# Patient Record
Sex: Female | Born: 1940 | Race: White | Hispanic: No | Smoking: Former smoker
Health system: Southern US, Community
[De-identification: ages and names within clinical notes are randomized; demographics above are authoritative.]

## PROBLEM LIST (undated history)

## (undated) DIAGNOSIS — I471 Supraventricular tachycardia, unspecified: Secondary | ICD-10-CM

## (undated) DIAGNOSIS — K269 Duodenal ulcer, unspecified as acute or chronic, without hemorrhage or perforation: Secondary | ICD-10-CM

## (undated) DIAGNOSIS — R918 Other nonspecific abnormal finding of lung field: Secondary | ICD-10-CM

## (undated) DIAGNOSIS — K5790 Diverticulosis of intestine, part unspecified, without perforation or abscess without bleeding: Secondary | ICD-10-CM

## (undated) DIAGNOSIS — E785 Hyperlipidemia, unspecified: Secondary | ICD-10-CM

## (undated) DIAGNOSIS — I5189 Other ill-defined heart diseases: Secondary | ICD-10-CM

## (undated) DIAGNOSIS — R0789 Other chest pain: Secondary | ICD-10-CM

## (undated) DIAGNOSIS — F419 Anxiety disorder, unspecified: Secondary | ICD-10-CM

## (undated) DIAGNOSIS — M8580 Other specified disorders of bone density and structure, unspecified site: Secondary | ICD-10-CM

## (undated) DIAGNOSIS — K2950 Unspecified chronic gastritis without bleeding: Secondary | ICD-10-CM

## (undated) DIAGNOSIS — A310 Pulmonary mycobacterial infection: Secondary | ICD-10-CM

## (undated) DIAGNOSIS — K2970 Gastritis, unspecified, without bleeding: Secondary | ICD-10-CM

## (undated) DIAGNOSIS — H2510 Age-related nuclear cataract, unspecified eye: Secondary | ICD-10-CM

## (undated) DIAGNOSIS — M1990 Unspecified osteoarthritis, unspecified site: Secondary | ICD-10-CM

## (undated) DIAGNOSIS — L719 Rosacea, unspecified: Secondary | ICD-10-CM

## (undated) DIAGNOSIS — Z9289 Personal history of other medical treatment: Secondary | ICD-10-CM

## (undated) DIAGNOSIS — J449 Chronic obstructive pulmonary disease, unspecified: Secondary | ICD-10-CM

## (undated) DIAGNOSIS — I2510 Atherosclerotic heart disease of native coronary artery without angina pectoris: Secondary | ICD-10-CM

## (undated) DIAGNOSIS — K449 Diaphragmatic hernia without obstruction or gangrene: Secondary | ICD-10-CM

## (undated) DIAGNOSIS — K225 Diverticulum of esophagus, acquired: Secondary | ICD-10-CM

## (undated) DIAGNOSIS — K2270 Barrett's esophagus without dysplasia: Secondary | ICD-10-CM

## (undated) DIAGNOSIS — J45909 Unspecified asthma, uncomplicated: Secondary | ICD-10-CM

## (undated) DIAGNOSIS — K219 Gastro-esophageal reflux disease without esophagitis: Secondary | ICD-10-CM

## (undated) HISTORY — DX: Supraventricular tachycardia, unspecified: I47.10

## (undated) HISTORY — DX: Barrett's esophagus without dysplasia: K22.70

## (undated) HISTORY — DX: Other chest pain: R07.89

## (undated) HISTORY — DX: Supraventricular tachycardia: I47.1

## (undated) HISTORY — DX: Personal history of other medical treatment: Z92.89

## (undated) HISTORY — DX: Other nonspecific abnormal finding of lung field: R91.8

## (undated) HISTORY — DX: Atherosclerotic heart disease of native coronary artery without angina pectoris: I25.10

## (undated) HISTORY — DX: Diverticulosis of intestine, part unspecified, without perforation or abscess without bleeding: K57.90

## (undated) HISTORY — DX: Other ill-defined heart diseases: I51.89

## (undated) HISTORY — DX: Diaphragmatic hernia without obstruction or gangrene: K44.9

## (undated) HISTORY — DX: Gastritis, unspecified, without bleeding: K29.70

## (undated) HISTORY — PX: EYE SURGERY: SHX253

## (undated) HISTORY — PX: TONSILLECTOMY: SUR1361

## (undated) HISTORY — DX: Unspecified osteoarthritis, unspecified site: M19.90

## (undated) HISTORY — DX: Chronic obstructive pulmonary disease, unspecified: J44.9

## (undated) HISTORY — DX: Pulmonary mycobacterial infection: A31.0

## (undated) HISTORY — PX: BREAST SURGERY: SHX581

## (undated) HISTORY — PX: CATARACT EXTRACTION W/ INTRAOCULAR LENS  IMPLANT, BILATERAL: SHX1307

---

## 2002-04-08 HISTORY — PX: BREAST EXCISIONAL BIOPSY: SUR124

## 2006-04-08 HISTORY — PX: BREAST BIOPSY: SHX20

## 2012-11-02 DIAGNOSIS — K317 Polyp of stomach and duodenum: Secondary | ICD-10-CM | POA: Insufficient documentation

## 2012-11-02 DIAGNOSIS — D131 Benign neoplasm of stomach: Secondary | ICD-10-CM | POA: Insufficient documentation

## 2012-11-19 DIAGNOSIS — R232 Flushing: Secondary | ICD-10-CM | POA: Insufficient documentation

## 2012-11-19 DIAGNOSIS — K219 Gastro-esophageal reflux disease without esophagitis: Secondary | ICD-10-CM | POA: Insufficient documentation

## 2012-11-19 DIAGNOSIS — K279 Peptic ulcer, site unspecified, unspecified as acute or chronic, without hemorrhage or perforation: Secondary | ICD-10-CM | POA: Insufficient documentation

## 2012-11-19 DIAGNOSIS — L719 Rosacea, unspecified: Secondary | ICD-10-CM | POA: Insufficient documentation

## 2012-11-19 HISTORY — DX: Peptic ulcer, site unspecified, unspecified as acute or chronic, without hemorrhage or perforation: K27.9

## 2012-12-14 DIAGNOSIS — H2510 Age-related nuclear cataract, unspecified eye: Secondary | ICD-10-CM | POA: Insufficient documentation

## 2012-12-25 DIAGNOSIS — R9389 Abnormal findings on diagnostic imaging of other specified body structures: Secondary | ICD-10-CM | POA: Insufficient documentation

## 2012-12-25 DIAGNOSIS — R799 Abnormal finding of blood chemistry, unspecified: Secondary | ICD-10-CM | POA: Insufficient documentation

## 2014-02-22 DIAGNOSIS — E782 Mixed hyperlipidemia: Secondary | ICD-10-CM | POA: Insufficient documentation

## 2014-03-02 ENCOUNTER — Ambulatory Visit: Payer: Self-pay | Admitting: Internal Medicine

## 2014-03-23 DIAGNOSIS — J4520 Mild intermittent asthma, uncomplicated: Secondary | ICD-10-CM | POA: Insufficient documentation

## 2014-07-05 ENCOUNTER — Ambulatory Visit: Payer: Self-pay | Admitting: Gastroenterology

## 2014-09-21 DIAGNOSIS — F419 Anxiety disorder, unspecified: Secondary | ICD-10-CM | POA: Insufficient documentation

## 2014-09-21 DIAGNOSIS — M8580 Other specified disorders of bone density and structure, unspecified site: Secondary | ICD-10-CM | POA: Insufficient documentation

## 2015-01-19 ENCOUNTER — Encounter: Payer: Self-pay | Admitting: *Deleted

## 2015-01-20 ENCOUNTER — Ambulatory Visit: Payer: Medicare Other | Admitting: Anesthesiology

## 2015-01-20 ENCOUNTER — Encounter
Admission: RE | Disposition: A | Discharge status: Home / Self Care | Payer: Self-pay | Source: Ambulatory Visit | Attending: Gastroenterology

## 2015-01-20 ENCOUNTER — Ambulatory Visit
Admission: RE | Admit: 2015-01-20 | Discharge: 2015-01-20 | Disposition: A | Discharge status: Home / Self Care | Payer: Medicare Other | Source: Ambulatory Visit | Attending: Gastroenterology | Admitting: Gastroenterology

## 2015-01-20 DIAGNOSIS — Z79899 Other long term (current) drug therapy: Secondary | ICD-10-CM | POA: Diagnosis not present

## 2015-01-20 DIAGNOSIS — Z888 Allergy status to other drugs, medicaments and biological substances status: Secondary | ICD-10-CM | POA: Insufficient documentation

## 2015-01-20 DIAGNOSIS — K2950 Unspecified chronic gastritis without bleeding: Secondary | ICD-10-CM | POA: Insufficient documentation

## 2015-01-20 DIAGNOSIS — K279 Peptic ulcer, site unspecified, unspecified as acute or chronic, without hemorrhage or perforation: Secondary | ICD-10-CM | POA: Insufficient documentation

## 2015-01-20 DIAGNOSIS — K225 Diverticulum of esophagus, acquired: Secondary | ICD-10-CM

## 2015-01-20 DIAGNOSIS — K219 Gastro-esophageal reflux disease without esophagitis: Secondary | ICD-10-CM | POA: Insufficient documentation

## 2015-01-20 DIAGNOSIS — J45909 Unspecified asthma, uncomplicated: Secondary | ICD-10-CM | POA: Diagnosis not present

## 2015-01-20 DIAGNOSIS — K317 Polyp of stomach and duodenum: Secondary | ICD-10-CM | POA: Insufficient documentation

## 2015-01-20 DIAGNOSIS — Z8719 Personal history of other diseases of the digestive system: Secondary | ICD-10-CM | POA: Diagnosis not present

## 2015-01-20 DIAGNOSIS — R1013 Epigastric pain: Secondary | ICD-10-CM | POA: Diagnosis present

## 2015-01-20 HISTORY — DX: Rosacea, unspecified: L71.9

## 2015-01-20 HISTORY — DX: Unspecified asthma, uncomplicated: J45.909

## 2015-01-20 HISTORY — DX: Age-related nuclear cataract, unspecified eye: H25.10

## 2015-01-20 HISTORY — PX: ESOPHAGOGASTRODUODENOSCOPY (EGD) WITH PROPOFOL: SHX5813

## 2015-01-20 HISTORY — DX: Diverticulum of esophagus, acquired: K22.5

## 2015-01-20 HISTORY — DX: Duodenal ulcer, unspecified as acute or chronic, without hemorrhage or perforation: K26.9

## 2015-01-20 HISTORY — DX: Gastro-esophageal reflux disease without esophagitis: K21.9

## 2015-01-20 SURGERY — ESOPHAGOGASTRODUODENOSCOPY (EGD) WITH PROPOFOL
Anesthesia: General

## 2015-01-20 MED ORDER — PROPOFOL 500 MG/50ML IV EMUL
INTRAVENOUS | Status: DC | PRN
  Administered 2015-01-20: 120 ug/kg/min via INTRAVENOUS

## 2015-01-20 MED ORDER — PROPOFOL 10 MG/ML IV BOLUS
INTRAVENOUS | Status: DC | PRN
  Administered 2015-01-20: 70 mg via INTRAVENOUS

## 2015-01-20 MED ORDER — FENTANYL CITRATE (PF) 100 MCG/2ML IJ SOLN
INTRAMUSCULAR | Status: DC | PRN
  Administered 2015-01-20: 50 ug via INTRAVENOUS

## 2015-01-20 MED ORDER — SODIUM CHLORIDE 0.9 % IV SOLN
INTRAVENOUS | Status: DC
  Administered 2015-01-20: 09:00:00 via INTRAVENOUS

## 2015-01-20 MED ORDER — GLYCOPYRROLATE 0.2 MG/ML IJ SOLN
INTRAMUSCULAR | Status: DC | PRN
  Administered 2015-01-20: 0.2 mg via INTRAVENOUS

## 2015-01-20 MED ORDER — LIDOCAINE HCL (CARDIAC) 20 MG/ML IV SOLN
INTRAVENOUS | Status: DC | PRN
  Administered 2015-01-20: 50 mg via INTRAVENOUS

## 2015-01-20 MED ORDER — MIDAZOLAM HCL 5 MG/5ML IJ SOLN
INTRAMUSCULAR | Status: DC | PRN
  Administered 2015-01-20: 1 mg via INTRAVENOUS

## 2015-01-20 MED ORDER — SODIUM CHLORIDE 0.9 % IV SOLN
INTRAVENOUS | Status: DC
  Administered 2015-01-20: 1000 mL via INTRAVENOUS

## 2015-01-20 MED ORDER — EPHEDRINE SULFATE 50 MG/ML IJ SOLN
INTRAMUSCULAR | Status: DC | PRN
  Administered 2015-01-20: 10 mg via INTRAVENOUS

## 2015-01-20 NOTE — Anesthesia Preprocedure Evaluation (Signed)
Anesthesia Evaluation  Patient identified by MRN, date of birth, ID band Patient awake    Reviewed: Allergy & Precautions, NPO status , Patient's Chart, lab work & pertinent test results  History of Anesthesia Complications Negative for: history of anesthetic complications  Airway Mallampati: I       Dental  (+) Teeth Intact   Pulmonary neg pulmonary ROS, asthma ,           Cardiovascular negative cardio ROS       Neuro/Psych negative neurological ROS     GI/Hepatic Neg liver ROS, PUD, GERD  Medicated,  Endo/Other  negative endocrine ROS  Renal/GU negative Renal ROS     Musculoskeletal   Abdominal   Peds  Hematology   Anesthesia Other Findings   Reproductive/Obstetrics                             Anesthesia Physical Anesthesia Plan  ASA: II  Anesthesia Plan: General   Post-op Pain Management:    Induction: Intravenous  Airway Management Planned: Nasal Cannula  Additional Equipment:   Intra-op Plan:   Post-operative Plan:   Informed Consent: I have reviewed the patients History and Physical, chart, labs and discussed the procedure including the risks, benefits and alternatives for the proposed anesthesia with the patient or authorized representative who has indicated his/her understanding and acceptance.     Plan Discussed with:   Anesthesia Plan Comments:         Anesthesia Quick Evaluation

## 2015-01-20 NOTE — H&P (Signed)
Outpatient short stay form Pre-procedure 01/20/2015 8:54 AM Alison Sails MD  Primary Physician: Dr. Glendon Axe  Reason for visit:  EGD  History of present illness:  Alison Bradshaw is a 74 year old female presenting today for EGD. She has a history of duodenal adenoma area there is also a history of a typically elevated chromogranin A without evidence of attributing lesion.    Current facility-administered medications:  .  0.9 %  sodium chloride infusion, , Intravenous, Continuous, Alison Sails, MD .  0.9 %  sodium chloride infusion, , Intravenous, Continuous, Alison Sails, MD  Prescriptions prior to admission  Medication Sig Dispense Refill Last Dose  . ALBUTEROL IN Inhale 90 mcg into the lungs every 6 (six) hours as needed.     . ALPRAZolam (XANAX) 0.25 MG tablet Take 0.25 mg by mouth 2 (two) times daily as needed for anxiety.     Marland Kitchen atorvastatin (LIPITOR) 10 MG tablet Take 10 mg by mouth daily.     . fluticasone (FLONASE) 50 MCG/ACT nasal spray Place 2 sprays into both nostrils daily.     . sucralfate (CARAFATE) 1 G tablet Take 1 g by mouth 4 (four) times daily -  with meals and at bedtime.        Allergies  Allergen Reactions  . Ceftin [Cefuroxime Axetil]   . Timentin [Ticarcillin-Pot Clavulanate]      Past Medical History  Diagnosis Date  . Duodenal ulcer   . Rosacea   . GERD (gastroesophageal reflux disease)   . Asthma   . Senile nuclear sclerosis     Review of systems:      Physical Exam    Heart and lungs: Regular rate and rhythm without rub or gallop, lungs are bilaterally clear    HEENT: Normocephalic atraumatic eyes are anicteric    Other:     Pertinant exam for procedure: Soft mild tenderness to palpation the epigastric region no masses rebound or organomegaly bowel sounds are positive normoactive.    Planned proceedures: EGD and indicated procedures I have discussed the risks benefits and complications of procedures to include not limited  to bleeding, infection, perforation and the risk of sedation and the Alison Bradshaw wishes to proceed.    Alison Sails, MD Gastroenterology 01/20/2015  8:54 AM

## 2015-01-20 NOTE — Op Note (Signed)
[facility] Gastroenterology Patient Name: Alison Bradshaw Procedure Date: 01/20/2015 9:04 AM MRN: [id number] Account #: [id number] Date of Birth: July 24, 1940 Admit Type: Outpatient Age: 74 Room: [facility] ENDO ROOM 3 Gender: Female Note Status: Finalized Procedure:         Upper GI endoscopy Indications:       Epigastric abdominal pain Providers:         Lollie Sails, MD Referring MD:      Glendon Axe (Referring MD) Medicines:         Monitored Anesthesia Care Complications:     No immediate complications. Procedure:         Pre-Anesthesia Assessment:                    - ASA Grade Assessment: II - A patient with mild systemic                     disease.                    After obtaining informed consent, the endoscope was passed                     under direct vision. Throughout the procedure, the                     patient's blood pressure, pulse, and oxygen saturations                     were monitored continuously. The Endoscope was introduced                     through the mouth, and advanced to the third part of                     duodenum. The upper GI endoscopy was accomplished without                     difficulty. The patient tolerated the procedure well. Findings:      LA Grade A (one or more mucosal breaks less than 5 mm, not extending       between tops of 2 mucosal folds) esophagitis with no bleeding was found.       Biopsies were taken with a cold forceps for histology.      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      The exam of the esophagus was otherwise normal.      Multiple 1 mm sessile polyps with no bleeding and no stigmata of recent       bleeding were found in the gastric body. Biopsies were taken with a cold       forceps for histology.      Diffuse minimal inflammation characterized by congestion (edema) and       erythema was found in  the gastric body. Biopsies were taken with a cold       forceps for histology.      Localized mild mucosal variance characterized by texture change was       found in the duodenal bulb. Biopsies were taken with a cold forceps for       histology.      the c-loop is very angulated in the second portion and the scope  was       very carefully passed to show a normal appearance to the 3rd portion. Impression:        - LA Grade A erosive esophagitis. Biopsied.                    - Zenker's diverticulum.                    - Multiple gastric polyps. Biopsied.                    - Bile gastritis. Biopsied.                    - Mucosal variant in the duodenum. Biopsied. Recommendation:    - Perform an upper GI series and small bowel follow                     through at appointment to be scheduled.                    - Continue present medications. Procedure Code(s): --- Professional ---                    [phone], Esophagogastroduodenoscopy, flexible, transoral;                     with biopsy, single or multiple Diagnosis Code(s): --- Professional ---                    530.19, Other esophagitis                    530.6, Diverticulum of esophagus, acquired                    211.1, Benign neoplasm of stomach                    535.40, Other specified gastritis, without mention of                     hemorrhage                    537.9, Unspecified disorder of stomach and duodenum                    789.06, Abdominal pain, epigastric CPT copyright 2014 American Medical Association. All rights reserved. The codes documented in this report are preliminary and upon coder review may  be revised to meet current compliance requirements. Lollie Sails, MD 01/20/2015 9:38:50 AM This report has been signed electronically. Number of Addenda: 0 Note Initiated On: 01/20/2015 9:04 AM      [facility]

## 2015-01-20 NOTE — Transfer of Care (Signed)
Immediate Anesthesia Transfer of Care Note  Patient: Alison Bradshaw  Procedure(s) Performed: Procedure(s): ESOPHAGOGASTRODUODENOSCOPY (EGD) WITH PROPOFOL (N/A)  Patient Location: Endoscopy Unit  Anesthesia Type:General  Level of Consciousness: awake  Airway & Oxygen Therapy: Patient Spontanous Breathing and Patient connected to nasal cannula oxygen  Post-op Assessment: Report given to RN  Post vital signs: Reviewed  Last Vitals:  Filed Vitals:   01/20/15 0934  BP: 91/41  Pulse: 74  Temp: 35.8 C  Resp: 18    Complications: No apparent anesthesia complications

## 2015-01-20 NOTE — Anesthesia Postprocedure Evaluation (Signed)
  Anesthesia Post-op Note  Patient: Alison Bradshaw  Procedure(s) Performed: Procedure(s): ESOPHAGOGASTRODUODENOSCOPY (EGD) WITH PROPOFOL (N/A)  Anesthesia type:General  Patient location: PACU  Post pain: Pain level controlled  Post assessment: Post-op Vital signs reviewed, Patient's Cardiovascular Status Stable, Respiratory Function Stable, Patent Airway and No signs of Nausea or vomiting  Post vital signs: Reviewed and stable  Last Vitals:  Filed Vitals:   01/20/15 1000  BP: 106/70  Pulse: 70  Temp:   Resp: 16    Level of consciousness: awake, alert  and patient cooperative  Complications: No apparent anesthesia complications

## 2015-01-23 LAB — SURGICAL PATHOLOGY

## 2015-01-25 ENCOUNTER — Encounter: Payer: Self-pay | Admitting: Gastroenterology

## 2015-04-10 ENCOUNTER — Emergency Department
Admission: EM | Admit: 2015-04-10 | Discharge: 2015-04-10 | Disposition: A | Discharge status: Home / Self Care | Payer: Medicare Other | Attending: Emergency Medicine | Admitting: Emergency Medicine

## 2015-04-10 DIAGNOSIS — H60502 Unspecified acute noninfective otitis externa, left ear: Secondary | ICD-10-CM | POA: Diagnosis not present

## 2015-04-10 DIAGNOSIS — Z7951 Long term (current) use of inhaled steroids: Secondary | ICD-10-CM | POA: Diagnosis not present

## 2015-04-10 DIAGNOSIS — Z79899 Other long term (current) drug therapy: Secondary | ICD-10-CM | POA: Diagnosis not present

## 2015-04-10 DIAGNOSIS — H9202 Otalgia, left ear: Secondary | ICD-10-CM | POA: Diagnosis present

## 2015-04-10 DIAGNOSIS — H6092 Unspecified otitis externa, left ear: Secondary | ICD-10-CM

## 2015-04-10 MED ORDER — ONDANSETRON 4 MG PO TBDP: 4.0000 mg | ORAL_TABLET | Freq: Three times a day (TID) | ORAL | Status: DC | PRN

## 2015-04-10 MED ORDER — OFLOXACIN 0.3 % OT SOLN: 5.0000 [drp] | Freq: Every day | OTIC | Status: DC

## 2015-04-10 MED ORDER — ONDANSETRON 4 MG PO TBDP
4.0000 mg | ORAL_TABLET | Freq: Once | ORAL | Status: AC
  Administered 2015-04-10: 4 mg via ORAL

## 2015-04-10 MED ORDER — OXYCODONE-ACETAMINOPHEN 5-325 MG PO TABS
1.0000 | ORAL_TABLET | Freq: Once | ORAL | Status: AC
  Administered 2015-04-10: 1 via ORAL

## 2015-04-10 MED ORDER — ONDANSETRON 4 MG PO TBDP
ORAL_TABLET | ORAL | Status: AC
  Administered 2015-04-10: 4 mg via ORAL
  Filled 2015-04-10: qty 1

## 2015-04-10 MED ORDER — OXYCODONE-ACETAMINOPHEN 5-325 MG PO TABS
ORAL_TABLET | ORAL | Status: AC
  Administered 2015-04-10: 1 via ORAL
  Filled 2015-04-10: qty 1

## 2015-04-10 MED ORDER — OXYCODONE-ACETAMINOPHEN 5-325 MG PO TABS: 1.0000 | ORAL_TABLET | Freq: Four times a day (QID) | ORAL | Status: DC | PRN

## 2015-04-10 NOTE — Discharge Instructions (Signed)
Otitis Externa Otitis externa is a germ infection in the outer ear. The outer ear is the area from the eardrum to the outside of the ear. Otitis externa is sometimes called "swimmer's ear." HOME CARE  Put drops in the ear as told by your doctor.  Only take medicine as told by your doctor.  If you have diabetes, your doctor may give you more directions. Follow your doctor's directions.  Keep all doctor visits as told. To avoid another infection:  Keep your ear dry. Use the corner of a towel to dry your ear after swimming or bathing.  Avoid scratching or putting things inside your ear.  Avoid swimming in lakes, dirty water, or pools that use a chemical called chlorine poorly.  You may use ear drops after swimming. Combine equal amounts of white vinegar and alcohol in a bottle. Put 3 or 4 drops in each ear. GET HELP IF:   You have a fever.  Your ear is still red, puffy (swollen), or painful after 3 days.  You still have yellowish-white fluid (pus) coming from the ear after 3 days.  Your redness, puffiness, or pain gets worse.  You have a really bad headache.  You have redness, puffiness, pain, or tenderness behind your ear. MAKE SURE YOU:   Understand these instructions.  Will watch your condition.  Will get help right away if you are not doing well or get worse.   This information is not intended to replace advice given to you by your health care provider. Make sure you discuss any questions you have with your health care provider.   Document Released: 09/11/2007 Document Revised: 04/15/2014 Document Reviewed: 04/11/2011 Elsevier Interactive Patient Education 2016 [employer]  

## 2015-04-10 NOTE — ED Provider Notes (Signed)
[facility] Emergency Department Provider Note  Time seen: 10:55 PM  I have reviewed the triage vital signs and the nursing notes.   HISTORY  Chief Complaint Otalgia    HPI Alison Bradshaw is a 75 y.o. female with a past medical history of gastric reflux, asthma, who presents to the emergency department with left ear pain. According to the patient actually one week ago she is expressing right ear pain. She was diagnosed with an external ear infection in the right ear which is largely improved. Patient states the pain is nearly gone in the right ear however the past 2 days she has neck strain significant pain in the left ear. Denies any fluid drainage from the ear. Denies fever. Denies nausea or vomiting. Patient describes the pain as significant, shooting pains that seem to come from her left ear. Also worse when she chews.     Past Medical History  Diagnosis Date  . Duodenal ulcer   . Rosacea   . GERD (gastroesophageal reflux disease)   . Asthma   . Senile nuclear sclerosis     There are no active problems to display for this patient.   Past Surgical History  Procedure Laterality Date  . Tonsillectomy    . Breast surgery    . Eye surgery    . Esophagogastroduodenoscopy (egd) with propofol N/A 01/20/2015    Procedure: ESOPHAGOGASTRODUODENOSCOPY (EGD) WITH PROPOFOL;  Surgeon: Lollie Sails, MD;  Location: [facility] ENDOSCOPY;  Service: Endoscopy;  Laterality: N/A;    Current Outpatient Rx  Name  Route  Sig  Dispense  Refill  . ALBUTEROL IN   Inhalation   Inhale 90 mcg into the lungs every 6 (six) hours as needed.         . ALPRAZolam (XANAX) 0.25 MG tablet   Oral   Take 0.25 mg by mouth 2 (two) times daily as needed for anxiety.         Marland Kitchen atorvastatin (LIPITOR) 10 MG tablet   Oral   Take 10 mg by mouth daily.         . fluticasone (FLONASE) 50 MCG/ACT nasal spray   Each Nare   Place 2 sprays into both nostrils daily.         Marland Kitchen  ofloxacin (FLOXIN OTIC) 0.3 % otic solution   Left Ear   Place 5 drops into the left ear daily.   5 mL   0   . ondansetron (ZOFRAN ODT) 4 MG disintegrating tablet   Oral   Take 1 tablet (4 mg total) by mouth every 8 (eight) hours as needed for nausea or vomiting.   20 tablet   0   . oxyCODONE-acetaminophen (ROXICET) 5-325 MG tablet   Oral   Take 1 tablet by mouth every 6 (six) hours as needed.   20 tablet   0   . sucralfate (CARAFATE) 1 G tablet   Oral   Take 1 g by mouth 4 (four) times daily -  with meals and at bedtime.           Allergies Ceftin and Timentin  No family history on file.  Social History Social History  Substance Use Topics  . Smoking status: Never Smoker   . Smokeless tobacco: Not on file  . Alcohol Use: 1.2 oz/week    2 Glasses of wine per week    Review of Systems Constitutional: Negative for fever ENT: Left ear pain. Cardiovascular: Negative for chest pain. Respiratory: Negative for  shortness of breath. Gastrointestinal: Negative for abdominal pain Skin: Negative for rash. Neurological: Negative for headache 10-point ROS otherwise negative.  ____________________________________________   PHYSICAL EXAM:  VITAL SIGNS: ED Triage Vitals  Enc Vitals Group     BP 04/10/15 2155 122/65 mmHg     Pulse Rate 04/10/15 2155 78     Resp 04/10/15 2155 18     Temp 04/10/15 2155 97.6 F (36.4 C)     Temp Source 04/10/15 2155 Oral     SpO2 04/10/15 2155 97 %     Weight 04/10/15 2155 150 lb (68.04 kg)     Height 04/10/15 2155 5\' 4"  (1.626 m)     Head Cir --      Peak Flow --      Pain Score 04/10/15 2156 8     Pain Loc --      Pain Edu? --      Excl. in [place]? --     Constitutional: Alert and oriented. Well appearing and in no distress. Eyes: Normal exam ENT   Head: Normocephalic and atraumatic.   Mouth/Throat: Mucous membranes are moist. Significant edema and tenderness of the left external ear canal. Most consistent with otitis  externa. Unable to visualize tympanic membrane due to the degree of swelling. Right tympanic membrane and auditory canal appear normal. No tenderness over her mastoid processes. Cardiovascular: Normal rate, regular rhythm. No murmur Respiratory: Normal respiratory effort without tachypnea nor retractions. Breath sounds are clear and equal bilaterally. No wheezes/rales/rhonchi. Gastrointestinal: Soft and nontender. No distention.  Musculoskeletal: Nontender with normal range of motion in all extremities. Ambulates without difficulty. Neurologic:  Normal speech and language. No gross focal neurologic deficits Skin:  Skin is warm, dry and intact.  Psychiatric: Mood and affect are normal. Speech and behavior are normal.   ____________________________________________     INITIAL IMPRESSION / ASSESSMENT AND PLAN / ED COURSE  Pertinent labs & imaging results that were available during my care of the patient were reviewed by me and considered in my medical decision making (see chart for details).  Exam most consistent with acute otitis externa. Patient has ear drops previously prescribed for right ear one week ago. I have written a new prescription to ensure she has sufficient quantity to treat a left otitis externa infection. Patient is also currently on levofloxacin prescribed by her primary care physician (orally). Patient is follow up with a primary care physician as soon as possible for recheck. Patient states she had a come to the emergency department tonight due to the shooting pains, she is not able to try to sleep due to the pain. No relief with over-the-counter medications. We'll prescribe Percocet for pain control, and have the patient follow up with a primary care physician in 2 days for recheck. Patient's prescription plan.  ____________________________________________   FINAL CLINICAL IMPRESSION(S) / ED DIAGNOSES  Left acute otitis externa   Harvest Dark, MD 04/10/15 2258

## 2015-04-10 NOTE — ED Notes (Addendum)
Pt was started on drops for bilat ear inflammation by urgent care thursday, worsening pain last night.  Today got rx for po cipro, has taken one dose but now hears muffled from left ear and worsening pain.

## 2015-04-10 NOTE — ED Notes (Signed)
Pt c/o left ear pain since Thursday, with swelling noted. Pt c/o dizziness and nausea. Pt has taken 1 of her antibiotics that was started today. Pt A&O

## 2015-09-29 DIAGNOSIS — B351 Tinea unguium: Secondary | ICD-10-CM | POA: Insufficient documentation

## 2015-09-29 DIAGNOSIS — F411 Generalized anxiety disorder: Secondary | ICD-10-CM | POA: Insufficient documentation

## 2015-09-29 DIAGNOSIS — R3129 Other microscopic hematuria: Secondary | ICD-10-CM | POA: Insufficient documentation

## 2015-12-21 DIAGNOSIS — M7061 Trochanteric bursitis, right hip: Secondary | ICD-10-CM | POA: Insufficient documentation

## 2015-12-21 DIAGNOSIS — R7303 Prediabetes: Secondary | ICD-10-CM | POA: Insufficient documentation

## 2015-12-21 DIAGNOSIS — M25551 Pain in right hip: Secondary | ICD-10-CM | POA: Insufficient documentation

## 2015-12-28 DIAGNOSIS — Z23 Encounter for immunization: Secondary | ICD-10-CM | POA: Insufficient documentation

## 2016-03-20 ENCOUNTER — Other Ambulatory Visit: Payer: Self-pay | Admitting: Gastroenterology

## 2016-03-20 DIAGNOSIS — R1031 Right lower quadrant pain: Secondary | ICD-10-CM

## 2016-03-20 DIAGNOSIS — K6289 Other specified diseases of anus and rectum: Secondary | ICD-10-CM

## 2016-03-20 DIAGNOSIS — R1032 Left lower quadrant pain: Principal | ICD-10-CM

## 2016-03-20 DIAGNOSIS — R300 Dysuria: Secondary | ICD-10-CM

## 2016-03-20 DIAGNOSIS — R197 Diarrhea, unspecified: Secondary | ICD-10-CM

## 2016-03-29 ENCOUNTER — Ambulatory Visit
Admission: RE | Admit: 2016-03-29 | Discharge: 2016-03-29 | Disposition: A | Discharge status: Home / Self Care | Payer: Medicare Other | Source: Ambulatory Visit | Attending: Gastroenterology | Admitting: Gastroenterology

## 2016-03-29 DIAGNOSIS — M5126 Other intervertebral disc displacement, lumbar region: Secondary | ICD-10-CM | POA: Insufficient documentation

## 2016-03-29 DIAGNOSIS — R1031 Right lower quadrant pain: Secondary | ICD-10-CM | POA: Diagnosis not present

## 2016-03-29 DIAGNOSIS — M47896 Other spondylosis, lumbar region: Secondary | ICD-10-CM | POA: Diagnosis not present

## 2016-03-29 DIAGNOSIS — R197 Diarrhea, unspecified: Secondary | ICD-10-CM | POA: Diagnosis not present

## 2016-03-29 DIAGNOSIS — K6289 Other specified diseases of anus and rectum: Secondary | ICD-10-CM | POA: Diagnosis not present

## 2016-03-29 DIAGNOSIS — R1032 Left lower quadrant pain: Secondary | ICD-10-CM | POA: Diagnosis not present

## 2016-03-29 DIAGNOSIS — R300 Dysuria: Secondary | ICD-10-CM

## 2016-03-29 MED ORDER — IOPAMIDOL (ISOVUE-300) INJECTION 61%
100.0000 mL | Freq: Once | INTRAVENOUS | Status: AC | PRN
  Administered 2016-03-29: 100 mL via INTRAVENOUS

## 2016-04-29 ENCOUNTER — Emergency Department: Payer: Medicare Other

## 2016-04-29 ENCOUNTER — Emergency Department
Admission: EM | Admit: 2016-04-29 | Discharge: 2016-04-29 | Disposition: A | Discharge status: Home / Self Care | Payer: Medicare Other | Attending: Emergency Medicine | Admitting: Emergency Medicine

## 2016-04-29 DIAGNOSIS — J189 Pneumonia, unspecified organism: Secondary | ICD-10-CM

## 2016-04-29 DIAGNOSIS — J181 Lobar pneumonia, unspecified organism: Secondary | ICD-10-CM | POA: Diagnosis not present

## 2016-04-29 DIAGNOSIS — J45909 Unspecified asthma, uncomplicated: Secondary | ICD-10-CM | POA: Insufficient documentation

## 2016-04-29 DIAGNOSIS — Z79899 Other long term (current) drug therapy: Secondary | ICD-10-CM | POA: Insufficient documentation

## 2016-04-29 DIAGNOSIS — R0602 Shortness of breath: Secondary | ICD-10-CM | POA: Diagnosis present

## 2016-04-29 LAB — CBC WITH DIFFERENTIAL/PLATELET
BASOS ABS: 0 10*3/uL (ref 0–0.1)
BLASTS: 0 %
Band Neutrophils: 5 %
Basophils Relative: 0 %
Eosinophils Absolute: 0 10*3/uL (ref 0–0.7)
Eosinophils Relative: 0 %
HEMATOCRIT: 38 % (ref 35.0–47.0)
HEMOGLOBIN: 13.5 g/dL (ref 12.0–16.0)
Lymphocytes Relative: 9 %
Lymphs Abs: 1.3 10*3/uL (ref 1.0–3.6)
MCH: 29.9 pg (ref 26.0–34.0)
MCHC: 35.4 g/dL (ref 32.0–36.0)
MCV: 84.5 fL (ref 80.0–100.0)
METAMYELOCYTES PCT: 0 %
MYELOCYTES: 0 %
Monocytes Absolute: 1.8 10*3/uL — ABNORMAL HIGH (ref 0.2–0.9)
Monocytes Relative: 13 %
NEUTROS PCT: 73 %
Neutro Abs: 10.9 10*3/uL — ABNORMAL HIGH (ref 1.4–6.5)
Other: 0 %
PROMYELOCYTES ABS: 0 %
Platelets: 134 10*3/uL — ABNORMAL LOW (ref 150–440)
RBC: 4.5 MIL/uL (ref 3.80–5.20)
RDW: 13.5 % (ref 11.5–14.5)
WBC: 14 10*3/uL — AB (ref 3.6–11.0)
nRBC: 0 /100 WBC

## 2016-04-29 LAB — COMPREHENSIVE METABOLIC PANEL
ALBUMIN: 4.2 g/dL (ref 3.5–5.0)
ALK PHOS: 73 U/L (ref 38–126)
ALT: 14 U/L (ref 14–54)
AST: 28 U/L (ref 15–41)
Anion gap: 7 (ref 5–15)
BILIRUBIN TOTAL: 0.8 mg/dL (ref 0.3–1.2)
BUN: 15 mg/dL (ref 6–20)
CALCIUM: 9 mg/dL (ref 8.9–10.3)
CO2: 27 mmol/L (ref 22–32)
Chloride: 102 mmol/L (ref 101–111)
Creatinine, Ser: 0.79 mg/dL (ref 0.44–1.00)
GFR calc Af Amer: >60 mL/min (ref >60)
GFR calc non Af Amer: >60 mL/min (ref >60)
GLUCOSE: 104 mg/dL — AB (ref 65–99)
Potassium: 3.6 mmol/L (ref 3.5–5.1)
Sodium: 136 mmol/L (ref 135–145)
TOTAL PROTEIN: 6.8 g/dL (ref 6.5–8.1)

## 2016-04-29 LAB — BRAIN NATRIURETIC PEPTIDE: B NATRIURETIC PEPTIDE 5: 76 pg/mL (ref 0.0–100.0)

## 2016-04-29 LAB — INFLUENZA PANEL BY PCR (TYPE A & B)
INFLBPCR: NEGATIVE
Influenza A By PCR: NEGATIVE

## 2016-04-29 LAB — TROPONIN I: Troponin I: <0.03 ng/mL (ref <0.03)

## 2016-04-29 MED ORDER — LEVOFLOXACIN IN D5W 750 MG/150ML IV SOLN
750.0000 mg | Freq: Once | INTRAVENOUS | Status: AC
  Administered 2016-04-29: 750 mg via INTRAVENOUS
  Filled 2016-04-29: qty 150

## 2016-04-29 MED ORDER — LEVOFLOXACIN 750 MG PO TABS: 750.0000 mg | ORAL_TABLET | Freq: Every day | ORAL | 0 refills | Status: AC

## 2016-04-29 NOTE — ED Notes (Signed)
Patient transported to X-ray 

## 2016-04-29 NOTE — ED Triage Notes (Signed)
Pt. States rt. Chest pain that started last night.  Pt. States pain continued throughout night.  Pt. States taking 1 gram of tylenol that gave little relief.

## 2016-04-29 NOTE — ED Notes (Signed)
E-signature box not working. Pt verbalized understanding of discharge instructions and denied questions. 

## 2016-04-29 NOTE — ED Provider Notes (Signed)
[facility] Emergency Department Provider Note   ____________________________________________    I have reviewed the triage vital signs and the nursing notes.   HISTORY  Chief Complaint Chest Pain (Pt. here via EMS from home for chest pain)     HPI Alison Bradshaw is a 76 y.o. female who presents with complaints of chest pain. Patient reports chest discomfort started yesterday evening. She reports it is primarily right sided and is sharp in nature. She reports is worse with deep breath. She also reports chills and body aches that started yesterday morning. She denies recent travel. No history of blood clots. No calf pain or swelling. No nausea vomiting or diarrhea. No abdominal pain   Past Medical History:  Diagnosis Date  . Asthma   . Duodenal ulcer   . GERD (gastroesophageal reflux disease)   . Rosacea   . Senile nuclear sclerosis     There are no active problems to display for this patient.   Past Surgical History:  Procedure Laterality Date  . BREAST SURGERY    . ESOPHAGOGASTRODUODENOSCOPY (EGD) WITH PROPOFOL N/A 01/20/2015   Procedure: ESOPHAGOGASTRODUODENOSCOPY (EGD) WITH PROPOFOL;  Surgeon: Lollie Sails, MD;  Location: [facility] ENDOSCOPY;  Service: Endoscopy;  Laterality: N/A;  . EYE SURGERY    . TONSILLECTOMY      Prior to Admission medications   Medication Sig Start Date End Date Taking? Authorizing Provider  acetaminophen (TYLENOL) 500 MG tablet Take 1,000 mg by mouth every 6 (six) hours as needed.   Yes Historical Provider, MD  ALBUTEROL IN Inhale 90 mcg into the lungs every 6 (six) hours as needed.   Yes Historical Provider, MD  ALPRAZolam (XANAX) 0.25 MG tablet Take 0.25 mg by mouth 2 (two) times daily as needed for anxiety.   Yes Historical Provider, MD  atorvastatin (LIPITOR) 10 MG tablet Take 10 mg by mouth daily.   Yes Historical Provider, MD  esomeprazole (NEXIUM) 40 MG capsule Take 40 mg by mouth daily. 03/25/16  Yes  Historical Provider, MD  ofloxacin (FLOXIN OTIC) 0.3 % otic solution Place 5 drops into the left ear daily. 04/10/15  Yes Harvest Dark, MD  sucralfate (CARAFATE) 1 G tablet Take 1-2 g by mouth daily as needed. Pt only takes when she's really needs it which isn't very often   Yes Historical Provider, MD  levofloxacin (LEVAQUIN) 750 MG tablet Take 1 tablet (750 mg total) by mouth daily. 04/29/16 05/06/16  Lavonia Drafts, MD  ondansetron (ZOFRAN ODT) 4 MG disintegrating tablet Take 1 tablet (4 mg total) by mouth every 8 (eight) hours as needed for nausea or vomiting. 04/10/15   Harvest Dark, MD  oxyCODONE-acetaminophen (ROXICET) 5-325 MG tablet Take 1 tablet by mouth every 6 (six) hours as needed. 04/10/15   Harvest Dark, MD     Allergies Ceftin [cefuroxime axetil] and Timentin [ticarcillin-pot clavulanate]  No family history on file.  Social History Social History  Substance Use Topics  . Smoking status: Never Smoker  . Smokeless tobacco: Not on file  . Alcohol use 1.2 oz/week    2 Glasses of wine per week    Review of Systems  Constitutional: Chills as above Eyes: No visual changes.  ENT: No sore throat,mild congestion Cardiovascular: As above Respiratory: Denies shortness of breath. Gastrointestinal: No abdominal pain.  No nausea, no vomiting.   Genitourinary: Negative for dysuria. Musculoskeletal: Positive for myalgias Skin: Negative for rash. Neurological: Negative for headaches   10-point ROS otherwise negative.  ____________________________________________  PHYSICAL EXAM:  VITAL SIGNS: ED Triage Vitals  Enc Vitals Group     BP 04/29/16 0648 (!) 115/46     Pulse Rate 04/29/16 0648 78     Resp 04/29/16 0648 18     Temp 04/29/16 0648 98.7 F (37.1 C)     Temp Source 04/29/16 0648 Oral     SpO2 04/29/16 0642 98 %     Weight 04/29/16 0648 148 lb (67.1 kg)     Height 04/29/16 0648 5\' 4"  (1.626 m)     Head Circumference --      Peak Flow --      Pain Score  04/29/16 0648 6     Pain Loc --      Pain Edu? --      Excl. in [place]? --     Constitutional: Alert and oriented. No acute distress. Pleasant and interactive Eyes: Conjunctivae are normal.   Nose: No congestion/rhinnorhea. Mouth/Throat: Mucous membranes are moist.    Cardiovascular: Normal rate, regular rhythm. Grossly normal heart sounds.  Good peripheral circulation. Respiratory: Normal respiratory effort.  No retractions. Lungs CTAB. Gastrointestinal: Soft and nontender. No distention.  No CVA tenderness. Genitourinary: deferred Musculoskeletal: No lower extremity tenderness nor edema.  Warm and well perfused Neurologic:  Normal speech and language. No gross focal neurologic deficits are appreciated.  Skin:  Skin is warm, dry and intact. No rash noted. Psychiatric: Mood and affect are normal. Speech and behavior are normal.  ____________________________________________   LABS (all labs ordered are listed, but only abnormal results are displayed)  Labs Reviewed  COMPREHENSIVE METABOLIC PANEL - Abnormal; Notable for the following:       Result Value   Glucose, Bld 104 (*)    All other components within normal limits  CBC WITH DIFFERENTIAL/PLATELET - Abnormal; Notable for the following:    WBC 14.0 (*)    Platelets 134 (*)    Neutro Abs 10.9 (*)    Monocytes Absolute 1.8 (*)    All other components within normal limits  TROPONIN I  INFLUENZA PANEL BY PCR (TYPE A & B)  BRAIN NATRIURETIC PEPTIDE  CBC WITH DIFFERENTIAL/PLATELET   ____________________________________________  EKG  ED ECG REPORT I, Lavonia Drafts, the attending physician, personally viewed and interpreted this ECG.  Date: 04/29/2016 EKG Time: 6:45 AM Rate: 81 Rhythm: normal sinus rhythm QRS Axis: normal Intervals: normal ST/T Wave abnormalities: normal Conduction Disturbances: none Narrative Interpretation: unremarkable  ____________________________________________  RADIOLOGY  Chest x-ray  demonstrates right middle lobe pneumonia ____________________________________________   PROCEDURES  Procedure(s) performed: No    Critical Care performed: No ____________________________________________   INITIAL IMPRESSION / ASSESSMENT AND PLAN / ED COURSE  Pertinent labs & imaging results that were available during my care of the patient were reviewed by me and considered in my medical decision making (see chart for details).  Patient presents with right-sided chest discomfort, also myalgias and chills. EKG is reassuring. Differential includes influenza, pneumonia, pleurisy, PE, ACS.    Chest x-ray consistent with pneumonia, white blood cell count mildly elevated as well. However vital signs are unremarkable. No tachycardia, blood pressure is normal. Afebrile. 98% on room air and she is well-appearing and in no distress. We will given IV dose of Levaquin here and discharged on by mouth Levaquin. We discussed return precautions at length the patient feels quite covered with this plan as is her husband. ____________________________________________   FINAL CLINICAL IMPRESSION(S) / ED DIAGNOSES  Final diagnoses:  Community acquired pneumonia of right middle  lobe of lung ([place])      NEW MEDICATIONS STARTED DURING THIS VISIT:  Discharge Medication List as of 04/29/2016  9:27 AM    START taking these medications   Details  levofloxacin (LEVAQUIN) 750 MG tablet Take 1 tablet (750 mg total) by mouth daily., Starting Mon 04/29/2016, Until Mon 05/06/2016, Print         Note:  This document was prepared using Dragon voice recognition software and may include unintentional dictation errors.    Lavonia Drafts, MD 04/29/16 1105

## 2016-04-30 ENCOUNTER — Other Ambulatory Visit: Payer: Self-pay | Admitting: Internal Medicine

## 2016-04-30 DIAGNOSIS — Z1231 Encounter for screening mammogram for malignant neoplasm of breast: Secondary | ICD-10-CM

## 2016-05-29 ENCOUNTER — Ambulatory Visit
Admission: RE | Admit: 2016-05-29 | Discharge: 2016-05-29 | Disposition: A | Discharge status: Home / Self Care | Payer: Medicare Other | Source: Ambulatory Visit | Attending: Internal Medicine | Admitting: Internal Medicine

## 2016-05-29 DIAGNOSIS — Z1231 Encounter for screening mammogram for malignant neoplasm of breast: Secondary | ICD-10-CM | POA: Insufficient documentation

## 2016-07-25 ENCOUNTER — Other Ambulatory Visit: Payer: Self-pay | Admitting: Internal Medicine

## 2016-07-25 ENCOUNTER — Ambulatory Visit
Admission: RE | Admit: 2016-07-25 | Discharge: 2016-07-25 | Disposition: A | Discharge status: Home / Self Care | Payer: Medicare Other | Source: Ambulatory Visit | Attending: Internal Medicine | Admitting: Internal Medicine

## 2016-07-25 DIAGNOSIS — M79605 Pain in left leg: Secondary | ICD-10-CM | POA: Insufficient documentation

## 2016-10-08 DIAGNOSIS — R110 Nausea: Secondary | ICD-10-CM | POA: Insufficient documentation

## 2016-10-10 DIAGNOSIS — M8589 Other specified disorders of bone density and structure, multiple sites: Secondary | ICD-10-CM | POA: Insufficient documentation

## 2016-11-04 ENCOUNTER — Encounter: Payer: Self-pay | Admitting: Emergency Medicine

## 2016-11-04 ENCOUNTER — Emergency Department
Admission: EM | Admit: 2016-11-04 | Discharge: 2016-11-05 | Disposition: A | Discharge status: Home / Self Care | Payer: Medicare Other | Attending: Emergency Medicine | Admitting: Emergency Medicine

## 2016-11-04 DIAGNOSIS — Z79899 Other long term (current) drug therapy: Secondary | ICD-10-CM | POA: Insufficient documentation

## 2016-11-04 DIAGNOSIS — J45909 Unspecified asthma, uncomplicated: Secondary | ICD-10-CM | POA: Diagnosis not present

## 2016-11-04 DIAGNOSIS — H5713 Ocular pain, bilateral: Secondary | ICD-10-CM | POA: Diagnosis present

## 2016-11-04 DIAGNOSIS — T578X1A Toxic effect of other specified inorganic substances, accidental (unintentional), initial encounter: Secondary | ICD-10-CM | POA: Diagnosis not present

## 2016-11-04 DIAGNOSIS — T2691XA Corrosion of right eye and adnexa, part unspecified, initial encounter: Secondary | ICD-10-CM | POA: Diagnosis not present

## 2016-11-04 DIAGNOSIS — Y93E8 Activity, other personal hygiene: Secondary | ICD-10-CM | POA: Diagnosis not present

## 2016-11-04 DIAGNOSIS — T2690XA Corrosion of unspecified eye and adnexa, part unspecified, initial encounter: Secondary | ICD-10-CM

## 2016-11-04 DIAGNOSIS — Y999 Unspecified external cause status: Secondary | ICD-10-CM | POA: Diagnosis not present

## 2016-11-04 DIAGNOSIS — Y929 Unspecified place or not applicable: Secondary | ICD-10-CM | POA: Diagnosis not present

## 2016-11-04 DIAGNOSIS — X58XXXA Exposure to other specified factors, initial encounter: Secondary | ICD-10-CM | POA: Insufficient documentation

## 2016-11-04 DIAGNOSIS — T2692XA Corrosion of left eye and adnexa, part unspecified, initial encounter: Secondary | ICD-10-CM | POA: Diagnosis not present

## 2016-11-04 DIAGNOSIS — H5789 Other specified disorders of eye and adnexa: Secondary | ICD-10-CM

## 2016-11-04 DIAGNOSIS — H578 Other specified disorders of eye and adnexa: Secondary | ICD-10-CM | POA: Insufficient documentation

## 2016-11-04 NOTE — ED Triage Notes (Signed)
At 11pm used ear wax removal drops in eyes bilat; rinsed eyes at home with water and baby shampoo

## 2016-11-05 MED ORDER — FLUORESCEIN SODIUM 0.6 MG OP STRP
1.0000 | ORAL_STRIP | Freq: Once | OPHTHALMIC | Status: AC
  Administered 2016-11-05: 1 via OPHTHALMIC
  Filled 2016-11-05: qty 1

## 2016-11-05 MED ORDER — ERYTHROMYCIN 5 MG/GM OP OINT
TOPICAL_OINTMENT | Freq: Once | OPHTHALMIC | Status: AC
  Administered 2016-11-05: 03:00:00 via OPHTHALMIC
  Filled 2016-11-05: qty 1

## 2016-11-05 MED ORDER — TETRACAINE HCL 0.5 % OP SOLN
2.0000 [drp] | Freq: Once | OPHTHALMIC | Status: AC
  Administered 2016-11-05: 2 [drp] via OPHTHALMIC
  Filled 2016-11-05: qty 4

## 2016-11-05 MED ORDER — ERYTHROMYCIN 5 MG/GM OP OINT: TOPICAL_OINTMENT | Freq: Three times a day (TID) | OPHTHALMIC | 0 refills | Status: AC

## 2016-11-05 NOTE — ED Provider Notes (Signed)
[facility] Emergency Department Provider Note   ____________________________________________   First MD Initiated Contact with Patient 11/04/16 2355     (approximate)  I have reviewed the triage vital signs and the nursing notes.   HISTORY  Chief Complaint Eye Pain    HPI Alison Bradshaw is a 76 y.o. female who comes into the hospital today with some eye pain. The patient reports that she accidentally placed eardrops in her bilateral eyes this evening. She reports that she did not realize it until she had just placed it in the second eye and she felt stinging. The patient lives in a retirement community and called the nurse at the facility. She was told to flush her eyes with water and baby shampoo and to come to the ER. The patient reports that her eyes feel irritated and they are burning. She reports that she washed it out a lot at home as well as in the emergency department waiting room. She reports that the light irritates her eyes as well. The patient is here tonight for evaluation.   Past Medical History:  Diagnosis Date  . Asthma   . Duodenal ulcer   . GERD (gastroesophageal reflux disease)   . Rosacea   . Senile nuclear sclerosis     There are no active problems to display for this patient.   Past Surgical History:  Procedure Laterality Date  . BREAST BIOPSY Left 2008   neg  . BREAST EXCISIONAL BIOPSY Left 2004   neg  . BREAST SURGERY    . ESOPHAGOGASTRODUODENOSCOPY (EGD) WITH PROPOFOL N/A 01/20/2015   Procedure: ESOPHAGOGASTRODUODENOSCOPY (EGD) WITH PROPOFOL;  Surgeon: Lollie Sails, MD;  Location: [facility] ENDOSCOPY;  Service: Endoscopy;  Laterality: N/A;  . EYE SURGERY    . TONSILLECTOMY      Prior to Admission medications   Medication Sig Start Date End Date Taking? Authorizing Provider  acetaminophen (TYLENOL) 500 MG tablet Take 1,000 mg by mouth every 6 (six) hours as needed.    [provider]  ALBUTEROL IN Inhale  90 mcg into the lungs every 6 (six) hours as needed.    [provider]  ALPRAZolam Duanne Moron) 0.25 MG tablet Take 0.25 mg by mouth 2 (two) times daily as needed for anxiety.    [provider]  atorvastatin (LIPITOR) 10 MG tablet Take 10 mg by mouth daily.    [provider]  erythromycin [facility]) ophthalmic ointment Place into both eyes 3 (three) times daily. Place a 1/2 inch ribbon of ointment into the lower eyelid. 11/05/16 11/15/16  Loney Hering, MD  esomeprazole (NEXIUM) 40 MG capsule Take 40 mg by mouth daily. 03/25/16   [provider]  ofloxacin (FLOXIN OTIC) 0.3 % otic solution Place 5 drops into the left ear daily. 04/10/15   Harvest Dark, MD  ondansetron (ZOFRAN ODT) 4 MG disintegrating tablet Take 1 tablet (4 mg total) by mouth every 8 (eight) hours as needed for nausea or vomiting. 04/10/15   Harvest Dark, MD  oxyCODONE-acetaminophen (ROXICET) 5-325 MG tablet Take 1 tablet by mouth every 6 (six) hours as needed. 04/10/15   Harvest Dark, MD  sucralfate (CARAFATE) 1 G tablet Take 1-2 g by mouth daily as needed. Pt only takes when she's really needs it which isn't very often    [provider]    Allergies Ceftin [cefuroxime axetil] and Timentin [ticarcillin-pot clavulanate]  No family history on file.  Social History Social History  Substance Use Topics  .  Smoking status: Never Smoker  . Smokeless tobacco: Never Used  . Alcohol use 1.2 oz/week    2 Glasses of wine per week    Review of Systems  Constitutional: No fever/chills Eyes: Eye burning ENT: No sore throat. Cardiovascular: Denies chest pain. Respiratory: Denies shortness of breath. Gastrointestinal: No abdominal pain.  No nausea, no vomiting.  No diarrhea.  No constipation. Genitourinary: Negative for dysuria. Musculoskeletal: Negative for back pain. Skin: Negative for rash. Neurological: Negative for headaches, focal weakness or  numbness.   ____________________________________________   PHYSICAL EXAM:  VITAL SIGNS: ED Triage Vitals [11/04/16 2348]  Enc Vitals Group     BP (!) 137/59     Pulse Rate (!) 57     Resp 16     Temp 98.3 F (36.8 C)     Temp Source Oral     SpO2 100 %     Weight 150 lb (68 kg)     Height 5\' 3"  (1.6 m)     Head Circumference      Peak Flow      Pain Score 3     Pain Loc      Pain Edu?      Excl. in [place]?     Constitutional: Alert and oriented. Well appearing and in Moderate distress. Eyes: Conjunctivae are normal. PERRL. EOMI. no uptake on fluorescein stain Head: Atraumatic. Nose: No congestion/rhinnorhea. Mouth/Throat: Mucous membranes are moist.  Oropharynx non-erythematous. Cardiovascular: Normal rate, regular rhythm. Grossly normal heart sounds.  Good peripheral circulation. Respiratory: Normal respiratory effort.  No retractions. Lungs CTAB. Gastrointestinal: Soft and nontender. No distention. Positive bowel sounds Musculoskeletal: No lower extremity tenderness nor edema.   Neurologic:  Normal speech and language.  Skin:  Skin is warm, dry and intact.  Psychiatric: Mood and affect are normal.   ____________________________________________   LABS (all labs ordered are listed, but only abnormal results are displayed)  Labs Reviewed - No data to display ____________________________________________  EKG  none ____________________________________________  RADIOLOGY  No results found.  ____________________________________________   PROCEDURES  Procedure(s) performed: None  Procedures  Critical Care performed: No  ____________________________________________   INITIAL IMPRESSION / ASSESSMENT AND PLAN / ED COURSE  Pertinent labs & imaging results that were available during my care of the patient were reviewed by me and considered in my medical decision making (see chart for details).  This is a 76 year old female who comes into the hospital  today with some eye burning after placing ear drops in her eyes. We did contact poison control and they recommended washing the patient's eyes bilaterally with a liter of piece. We attempted to use a Morgan lens to wash out the patient's eyes. The patient was complaining of significant burning and pain with the normal saline. We tried to explain to her that it is uncomfortable but the best thing is just to get the eyes washed out. The patient could not tolerate it and only received approximately 300 mL in the right eye approximately one to 200 in the left eye. We decided to manually wash out the patient's eyes with some sterile water. I will also place some erythromycin eye ointment and the patient's eyes. She will be discharged to follow-up with ophthalmology. Again the patient had no corneal abrasions or ulcers noted on evaluation.      ____________________________________________   FINAL CLINICAL IMPRESSION(S) / ED DIAGNOSES  Final diagnoses:  Eye irritation  Chemical insult, eye, unspecified laterality, initial encounter      NEW  MEDICATIONS STARTED DURING THIS VISIT:  New Prescriptions   ERYTHROMYCIN (ROMYCIN) OPHTHALMIC OINTMENT    Place into both eyes 3 (three) times daily. Place a 1/2 inch ribbon of ointment into the lower eyelid.     Note:  This document was prepared using Dragon voice recognition software and may include unintentional dictation errors.    Loney Hering, MD 11/05/16 [phone]

## 2016-11-05 NOTE — Discharge Instructions (Signed)
Please follow-up with ophthalmology for further reevaluation of your eye.

## 2016-11-05 NOTE — ED Notes (Signed)
Pt presented with bottle of "ear wax removal" drops. States placed in both eyes, immediate watering and burning began. See triage note.   This RN spoke with poison control. PC recommendations: MD examine both eyes prior to irrigation. Irrigate 1L saline per eye. MD examine post irrigation. If no abrasions present on eyes, MD to d/c pt, no need to follow up with PC if no further issues.

## 2017-01-21 ENCOUNTER — Encounter: Payer: Self-pay | Admitting: *Deleted

## 2017-01-22 ENCOUNTER — Encounter: Payer: Self-pay | Admitting: *Deleted

## 2017-01-22 ENCOUNTER — Encounter
Admission: RE | Disposition: A | Discharge status: Home / Self Care | Payer: Self-pay | Source: Ambulatory Visit | Attending: Internal Medicine

## 2017-01-22 ENCOUNTER — Encounter: Admission: RE | Payer: Self-pay | Source: Ambulatory Visit

## 2017-01-22 ENCOUNTER — Ambulatory Visit
Admission: RE | Admit: 2017-01-22 | Discharge: 2017-01-22 | Disposition: A | Discharge status: Home / Self Care | Payer: Medicare Other | Source: Ambulatory Visit | Attending: Internal Medicine | Admitting: Internal Medicine

## 2017-01-22 ENCOUNTER — Ambulatory Visit: Admission: RE | Admit: 2017-01-22 | Payer: Medicare Other | Source: Ambulatory Visit | Admitting: Gastroenterology

## 2017-01-22 ENCOUNTER — Ambulatory Visit: Payer: Medicare Other | Admitting: Anesthesiology

## 2017-01-22 ENCOUNTER — Ambulatory Visit: Admission: RE | Admit: 2017-01-22 | Payer: Self-pay | Source: Ambulatory Visit | Admitting: Internal Medicine

## 2017-01-22 DIAGNOSIS — Z1211 Encounter for screening for malignant neoplasm of colon: Secondary | ICD-10-CM | POA: Diagnosis present

## 2017-01-22 DIAGNOSIS — M8580 Other specified disorders of bone density and structure, unspecified site: Secondary | ICD-10-CM | POA: Insufficient documentation

## 2017-01-22 DIAGNOSIS — Z8719 Personal history of other diseases of the digestive system: Secondary | ICD-10-CM | POA: Insufficient documentation

## 2017-01-22 DIAGNOSIS — F419 Anxiety disorder, unspecified: Secondary | ICD-10-CM | POA: Insufficient documentation

## 2017-01-22 DIAGNOSIS — Z79899 Other long term (current) drug therapy: Secondary | ICD-10-CM | POA: Diagnosis not present

## 2017-01-22 DIAGNOSIS — Z888 Allergy status to other drugs, medicaments and biological substances status: Secondary | ICD-10-CM | POA: Insufficient documentation

## 2017-01-22 DIAGNOSIS — K5730 Diverticulosis of large intestine without perforation or abscess without bleeding: Secondary | ICD-10-CM | POA: Insufficient documentation

## 2017-01-22 DIAGNOSIS — Z8711 Personal history of peptic ulcer disease: Secondary | ICD-10-CM | POA: Diagnosis not present

## 2017-01-22 DIAGNOSIS — K225 Diverticulum of esophagus, acquired: Secondary | ICD-10-CM | POA: Insufficient documentation

## 2017-01-22 DIAGNOSIS — K640 First degree hemorrhoids: Secondary | ICD-10-CM | POA: Diagnosis not present

## 2017-01-22 DIAGNOSIS — J45909 Unspecified asthma, uncomplicated: Secondary | ICD-10-CM | POA: Diagnosis not present

## 2017-01-22 DIAGNOSIS — K317 Polyp of stomach and duodenum: Secondary | ICD-10-CM | POA: Insufficient documentation

## 2017-01-22 DIAGNOSIS — Z86010 Personal history of colonic polyps: Secondary | ICD-10-CM | POA: Insufficient documentation

## 2017-01-22 DIAGNOSIS — Z880 Allergy status to penicillin: Secondary | ICD-10-CM | POA: Diagnosis not present

## 2017-01-22 DIAGNOSIS — K2950 Unspecified chronic gastritis without bleeding: Secondary | ICD-10-CM | POA: Diagnosis not present

## 2017-01-22 DIAGNOSIS — K219 Gastro-esophageal reflux disease without esophagitis: Secondary | ICD-10-CM | POA: Diagnosis not present

## 2017-01-22 HISTORY — DX: Diverticulum of esophagus, acquired: K22.5

## 2017-01-22 HISTORY — DX: Other specified disorders of bone density and structure, unspecified site: M85.80

## 2017-01-22 HISTORY — DX: Anxiety disorder, unspecified: F41.9

## 2017-01-22 HISTORY — PX: COLONOSCOPY WITH PROPOFOL: SHX5780

## 2017-01-22 HISTORY — DX: Unspecified chronic gastritis without bleeding: K29.50

## 2017-01-22 HISTORY — PX: ESOPHAGOGASTRODUODENOSCOPY (EGD) WITH PROPOFOL: SHX5813

## 2017-01-22 SURGERY — EGD (ESOPHAGOGASTRODUODENOSCOPY)
Anesthesia: General

## 2017-01-22 SURGERY — ESOPHAGOGASTRODUODENOSCOPY (EGD) WITH PROPOFOL
Anesthesia: General

## 2017-01-22 SURGERY — COLONOSCOPY WITH PROPOFOL
Anesthesia: General

## 2017-01-22 MED ORDER — SODIUM CHLORIDE 0.9 % IV SOLN
INTRAVENOUS | Status: DC
  Administered 2017-01-22 (×2): via INTRAVENOUS

## 2017-01-22 MED ORDER — LIDOCAINE HCL (CARDIAC) 20 MG/ML IV SOLN
INTRAVENOUS | Status: DC | PRN
  Administered 2017-01-22: 80 mg via INTRAVENOUS

## 2017-01-22 MED ORDER — PROPOFOL 500 MG/50ML IV EMUL
INTRAVENOUS | Status: DC | PRN
  Administered 2017-01-22: 125 ug/kg/min via INTRAVENOUS

## 2017-01-22 MED ORDER — PROPOFOL 10 MG/ML IV BOLUS
INTRAVENOUS | Status: DC | PRN
  Administered 2017-01-22: 50 mg via INTRAVENOUS

## 2017-01-22 NOTE — Transfer of Care (Signed)
Immediate Anesthesia Transfer of Care Note  Patient: Alison Bradshaw  Procedure(s) Performed: COLONOSCOPY WITH PROPOFOL (N/A ) ESOPHAGOGASTRODUODENOSCOPY (EGD) WITH PROPOFOL (N/A )  Patient Location: PACU  Anesthesia Type:General  Level of Consciousness: sedated  Airway & Oxygen Therapy: Patient Spontanous Breathing and Patient connected to nasal cannula oxygen  Post-op Assessment: Report given to RN and Post -op Vital signs reviewed and stable  Post vital signs: Reviewed and stable  Last Vitals:  Vitals:   01/22/17 0856  BP: (!) 125/56  Pulse: 88  Resp: (!) 22  Temp: (!) 36.3 C  SpO2: 98%    Last Pain:  Vitals:   01/22/17 0856  TempSrc: Tympanic         Complications: No apparent anesthesia complications

## 2017-01-22 NOTE — Op Note (Signed)
[facility] Gastroenterology Patient Name: Alison Bradshaw Procedure Date: 01/22/2017 9:21 AM MRN: [id number] Account #: [id number] Date of Birth: Oct 24, 1940 Admit Type: Outpatient Age: 76 Room: [address] [facility] ENDO ROOM 3 Gender: Female Note Status: Finalized Procedure:            Colonoscopy Indications:          High risk colon cancer surveillance: Personal history                        of colonic polyps Providers:            Benay Pike. Alice Reichert MD, MD Referring MD:         Glendon Axe (Referring MD) Medicines:            Propofol per Anesthesia Complications:        No immediate complications. Procedure:            Pre-Anesthesia Assessment:                       - The risks and benefits of the procedure and the                        sedation options and risks were discussed with the                        patient. All questions were answered and informed                        consent was obtained.                       - The risks and benefits of the procedure and the                        sedation options and risks were discussed with the                        patient. All questions were answered and informed                        consent was obtained.                       - Patient identification and proposed procedure were                        verified prior to the procedure by the nurse. The                        procedure was verified in the endoscopy suite.                       - ASA Grade Assessment: III - A patient with severe                        systemic disease.                       - After reviewing the risks and benefits, the patient  was deemed in satisfactory condition to undergo the                        procedure.                       After obtaining informed consent, the colonoscope was                        passed under direct vision. Throughout the procedure,                        the patient's blood pressure,  pulse, and oxygen                        saturations were monitored continuously. The                        Colonoscope was introduced through the anus and                        advanced to the the cecum, identified by appendiceal                        orifice and ileocecal valve. The colonoscopy was                        performed without difficulty. The patient tolerated the                        procedure well. The quality of the bowel preparation                        was excellent. Findings:      The perianal and digital rectal examinations were normal. Pertinent       negatives include normal sphincter tone and no palpable rectal lesions.      Multiple small-mouthed diverticula were found in the sigmoid colon.      Non-bleeding internal hemorrhoids were found during retroflexion. The       hemorrhoids were Grade I (internal hemorrhoids that do not prolapse).      The exam was otherwise without abnormality. Impression:           - Diverticulosis in the sigmoid colon.                       - Non-bleeding internal hemorrhoids.                       - The examination was otherwise normal.                       - No specimens collected. Recommendation:       - Patient has a contact number available for                        emergencies. The signs and symptoms of potential                        delayed complications were discussed with the patient.  Return to normal activities tomorrow. Written discharge                        instructions were provided to the patient.                       - Resume previous diet.                       - Continue present medications.                       - No repeat colonoscopy due to age.                       - Return to my office in 6 months.                       - The findings and recommendations were discussed with                        the patient.                       - The findings and recommendations were  discussed with                        the patient's family. Procedure Code(s):    --- Professional ---                       E2683, Colorectal cancer screening; colonoscopy on                        individual at high risk CPT copyright 2016 American Medical Association. All rights reserved. The codes documented in this report are preliminary and upon coder review may  be revised to meet current compliance requirements. Efrain Sella MD, MD 01/22/2017 9:56:05 AM This report has been signed electronically. Number of Addenda: 0 Note Initiated On: 01/22/2017 9:21 AM Scope Withdrawal Time: 0 hours 6 minutes 30 seconds  Total Procedure Duration: 0 hours 11 minutes 49 seconds       [facility]

## 2017-01-22 NOTE — Anesthesia Preprocedure Evaluation (Signed)
Anesthesia Evaluation  Patient identified by MRN, date of birth, ID band Patient awake    Reviewed: Allergy & Precautions, NPO status , Patient's Chart, lab work & pertinent test results  History of Anesthesia Complications Negative for: history of anesthetic complications  Airway Mallampati: II       Dental   Pulmonary neg sleep apnea, neg COPD,           Cardiovascular (-) hypertension(-) Past MI and (-) CHF (-) dysrhythmias (-) Valvular Problems/Murmurs     Neuro/Psych neg Seizures Anxiety    GI/Hepatic Neg liver ROS, PUD, GERD  Medicated,  Endo/Other  neg diabetes  Renal/GU negative Renal ROS     Musculoskeletal   Abdominal   Peds  Hematology   Anesthesia Other Findings   Reproductive/Obstetrics                             Anesthesia Physical Anesthesia Plan  ASA: II  Anesthesia Plan: General   Post-op Pain Management:    Induction: Intravenous  PONV Risk Score and Plan: Propofol infusion  Airway Management Planned: Nasal Cannula  Additional Equipment:   Intra-op Plan:   Post-operative Plan:   Informed Consent: I have reviewed the patients History and Physical, chart, labs and discussed the procedure including the risks, benefits and alternatives for the proposed anesthesia with the patient or authorized representative who has indicated his/her understanding and acceptance.     Plan Discussed with:   Anesthesia Plan Comments:         Anesthesia Quick Evaluation

## 2017-01-22 NOTE — Anesthesia Procedure Notes (Signed)
Date/Time: 01/22/2017 9:32 AM Performed by: Nelda Marseille Pre-anesthesia Checklist: Patient identified, Emergency Drugs available, Suction available, Patient being monitored and Timeout performed Oxygen Delivery Method: Nasal cannula

## 2017-01-22 NOTE — Anesthesia Postprocedure Evaluation (Signed)
Anesthesia Post Note  Patient: Alison Bradshaw  Procedure(s) Performed: COLONOSCOPY WITH PROPOFOL (N/A ) ESOPHAGOGASTRODUODENOSCOPY (EGD) WITH PROPOFOL (N/A )  Patient location during evaluation: Endoscopy Anesthesia Type: General Level of consciousness: awake and alert Pain management: pain level controlled Vital Signs Assessment: post-procedure vital signs reviewed and stable Respiratory status: spontaneous breathing and respiratory function stable Cardiovascular status: stable Anesthetic complications: no     Last Vitals:  Vitals:   01/22/17 1028 01/22/17 1038  BP: 117/65 116/61  Pulse: (!) 52 (!) 48  Resp: 15 12  Temp:    SpO2: 100% 100%    Last Pain:  Vitals:   01/22/17 0958  TempSrc: Tympanic                 KEPHART,WILLIAM K

## 2017-01-22 NOTE — Anesthesia Post-op Follow-up Note (Signed)
Anesthesia QCDR form completed.        

## 2017-01-22 NOTE — H&P (Signed)
Outpatient short stay form Pre-procedure 01/22/2017 9:18 AM Alison Bradshaw K. Alice Reichert, M.D.  Primary Physician: Glendon Axe, M.D.  Reason for visit:  Epigastric pain, personal hx of colon polyps  History of present illness:  Patient is a pleasant 76 y/o female presenting for recurrent epigastric burning pain, slightly improved without dysphagia. Also has remote hx of colon polyps. Last colonoscopy endoscopy by Dr. Gustavo Lah on 01/20/2015 revealed H Pylori negative gastritis, gastric fundic gland polyps, Small Zenker's diverticulum. Last colonoscopy performed 12/02/2011 in [place] by DR. Thereasa Parkin revealed colonic diverticulosis and internal hemorrhoids.    Current Facility-Administered Medications:  .  0.9 %  sodium chloride infusion, , Intravenous, Continuous, [place], Benay Pike, MD, Last Rate: 20 mL/hr at 01/22/17 2536  Prescriptions Prior to Admission  Medication Sig Dispense Refill Last Dose  . ALBUTEROL IN Inhale 90 mcg into the lungs every 6 (six) hours as needed.   Past Week at Unknown time  . ALPRAZolam (XANAX) 0.25 MG tablet Take 0.25 mg by mouth 2 (two) times daily as needed for anxiety.   01/21/2017 at Unknown time  . atorvastatin (LIPITOR) 10 MG tablet Take 10 mg by mouth daily.   Past Week at Unknown time  . dicyclomine (BENTYL) 10 MG capsule Take 10 mg by mouth 4 (four) times daily -  before meals and at bedtime.   Past Month at Unknown time  . esomeprazole (NEXIUM) 40 MG capsule Take 40 mg by mouth daily.  3 01/21/2017 at Unknown time  . sucralfate (CARAFATE) 1 G tablet Take 1 g by mouth 4 (four) times daily -  with meals and at bedtime. Pt only takes when she's really needs it which isn't very often   Past Week at Unknown time  . acetaminophen (TYLENOL) 500 MG tablet Take 1,000 mg by mouth every 6 (six) hours as needed.   04/28/2016 at 1345  . ofloxacin (FLOXIN OTIC) 0.3 % otic solution Place 5 drops into the left ear daily. (Patient not taking: Reported on 01/22/2017) 5 mL 0 Completed  Course at Unknown time  . ondansetron (ZOFRAN ODT) 4 MG disintegrating tablet Take 1 tablet (4 mg total) by mouth every 8 (eight) hours as needed for nausea or vomiting. (Patient not taking: Reported on 01/22/2017) 20 tablet 0 Not Taking at Unknown time  . oxyCODONE-acetaminophen (ROXICET) 5-325 MG tablet Take 1 tablet by mouth every 6 (six) hours as needed. (Patient not taking: Reported on 01/22/2017) 20 tablet 0 Completed Course at Unknown time     Allergies  Allergen Reactions  . Ceftin [Cefuroxime Axetil] Hives and Rash  . Timentin [Ticarcillin-Pot Clavulanate] Hives and Rash     Past Medical History:  Diagnosis Date  . Anxiety   . Asthma   . Chronic gastritis   . Duodenal ulcer   . GERD (gastroesophageal reflux disease)   . Osteopenia   . Rosacea   . Senile nuclear sclerosis   . Zenker's diverticulum 01/20/2015    Review of systems:      Physical Exam  General appearance: alert, cooperative and appears stated age Resp: clear to auscultation bilaterally Cardio: regular rate and rhythm, S1, S2 normal, no murmur, click, rub or gallop GI: soft, non-tender; bowel sounds normal; no masses,  no organomegaly     Planned procedures: Proceed with EGD and colonoscopy. The patient understands the nature of the planned procedure, indications, risks, alternatives and potential complications including but not limited to bleeding, infection, perforation, damage to internal organs and possible oversedation/side effects from anesthesia. The patient  agrees and gives consent to proceed.  Please refer to procedure notes for findings, recommendations and patient disposition/instructions.    Zyairah Wacha K. Alice Reichert, M.D. Gastroenterology 01/22/2017  9:18 AM

## 2017-01-22 NOTE — Op Note (Addendum)
[facility] Gastroenterology Patient Name: Alison Bradshaw Procedure Date: 01/22/2017 9:20 AM MRN: [id number] Account #: [id number] Date of Birth: 20-Mar-1941 Admit Type: Outpatient Age: 76 Room: [facility] ENDO ROOM 3 Gender: Female Note Status: Finalized Procedure:            Upper GI endoscopy Indications:          Epigastric abdominal pain, Esophageal reflux Providers:            Benay Pike. Alice Reichert MD, MD Referring MD:         Glendon Axe (Referring MD) Complications:        No immediate complications. Procedure:            Pre-Anesthesia Assessment:                       - The risks and benefits of the procedure and the                        sedation options and risks were discussed with the                        patient. All questions were answered and informed                        consent was obtained.                       - Patient identification and proposed procedure were                        verified prior to the procedure by the nurse. The                        procedure was verified in the endoscopy suite.                       - ASA Grade Assessment: III - A patient with severe                        systemic disease.                       - After reviewing the risks and benefits, the patient                        was deemed in satisfactory condition to undergo the                        procedure.                       After obtaining informed consent, the endoscope was                        passed under direct vision. Throughout the procedure,                        the patient's blood pressure, pulse, and oxygen                        saturations were monitored continuously. The Endoscope  was introduced through the mouth, and advanced to the                        third part of duodenum. The upper GI endoscopy was                        accomplished without difficulty. The patient tolerated                        the  procedure well. Findings:      A Zenker's diverticulum with a small opening, no impacted food and no       stigmata of recent bleeding was found.      Normal mucosa was found in the entire esophagus.      Multiple sessile polyps with no stigmata of recent bleeding were found       in the gastric body.      Localized minimal inflammation characterized by erythema was found in       the gastric antrum. Biopsies were taken with a cold forceps for       Helicobacter pylori testing.      The cardia and gastric fundus were normal on retroflexion.      The examined duodenum was normal. Impression:           - Zenker's diverticulum.                       - Normal mucosa was found in the entire esophagus.                       - Multiple gastric polyps.                       - Gastritis. Biopsied.                       - Normal examined duodenum. Recommendation:       - Await pathology results.                       - See the other procedure note for documentation of                        additional recommendations. Procedure Code(s):    --- Professional ---                       [phone], Esophagogastroduodenoscopy, flexible, transoral;                        with biopsy, single or multiple Diagnosis Code(s):    --- Professional ---                       K22.5, Diverticulum of esophagus, acquired                       K31.7, Polyp of stomach and duodenum                       K29.70, Gastritis, unspecified, without bleeding                       R10.13, Epigastric pain  K21.9, Gastro-esophageal reflux disease without                        esophagitis CPT copyright 2016 [employer] Association. All rights reserved. The codes documented in this report are preliminary and upon coder review may  be revised to meet current compliance requirements. Efrain Sella MD, MD 01/22/2017 9:39:14 AM This report has been signed electronically. Number of Addenda: 0 Note  Initiated On: 01/22/2017 9:20 AM      [facility]

## 2017-01-23 ENCOUNTER — Encounter: Payer: Self-pay | Admitting: Internal Medicine

## 2017-01-23 LAB — SURGICAL PATHOLOGY

## 2017-10-26 ENCOUNTER — Emergency Department
Admission: EM | Admit: 2017-10-26 | Discharge: 2017-10-26 | Disposition: A | Discharge status: Home / Self Care | Payer: Medicare Other | Attending: Emergency Medicine | Admitting: Emergency Medicine

## 2017-10-26 ENCOUNTER — Emergency Department: Payer: Medicare Other

## 2017-10-26 ENCOUNTER — Encounter: Payer: Self-pay | Admitting: Emergency Medicine

## 2017-10-26 ENCOUNTER — Other Ambulatory Visit: Payer: Self-pay

## 2017-10-26 DIAGNOSIS — K219 Gastro-esophageal reflux disease without esophagitis: Secondary | ICD-10-CM | POA: Diagnosis not present

## 2017-10-26 DIAGNOSIS — J40 Bronchitis, not specified as acute or chronic: Secondary | ICD-10-CM | POA: Diagnosis not present

## 2017-10-26 DIAGNOSIS — R0602 Shortness of breath: Secondary | ICD-10-CM | POA: Diagnosis present

## 2017-10-26 DIAGNOSIS — Z79899 Other long term (current) drug therapy: Secondary | ICD-10-CM | POA: Diagnosis not present

## 2017-10-26 LAB — CBC
HCT: 38.5 % (ref 35.0–47.0)
Hemoglobin: 13.7 g/dL (ref 12.0–16.0)
MCH: 30.7 pg (ref 26.0–34.0)
MCHC: 35.6 g/dL (ref 32.0–36.0)
MCV: 86.4 fL (ref 80.0–100.0)
PLATELETS: 193 10*3/uL (ref 150–440)
RBC: 4.46 MIL/uL (ref 3.80–5.20)
RDW: 13.9 % (ref 11.5–14.5)
WBC: 5.6 10*3/uL (ref 3.6–11.0)

## 2017-10-26 LAB — BASIC METABOLIC PANEL
Anion gap: 9 (ref 5–15)
BUN: 12 mg/dL (ref 8–23)
CHLORIDE: 100 mmol/L (ref 98–111)
CO2: 26 mmol/L (ref 22–32)
CREATININE: 0.74 mg/dL (ref 0.44–1.00)
Calcium: 8.9 mg/dL (ref 8.9–10.3)
GFR calc Af Amer: >60 mL/min (ref >60)
GFR calc non Af Amer: >60 mL/min (ref >60)
Glucose, Bld: 127 mg/dL — ABNORMAL HIGH (ref 70–99)
Potassium: 3.4 mmol/L — ABNORMAL LOW (ref 3.5–5.1)
Sodium: 135 mmol/L (ref 135–145)

## 2017-10-26 LAB — TROPONIN I

## 2017-10-26 MED ORDER — ALBUTEROL SULFATE HFA 108 (90 BASE) MCG/ACT IN AERS: 2.0000 | INHALATION_SPRAY | Freq: Four times a day (QID) | RESPIRATORY_TRACT | 0 refills | Status: DC | PRN

## 2017-10-26 MED ORDER — PREDNISONE 20 MG PO TABS: 60.0000 mg | ORAL_TABLET | Freq: Every day | ORAL | 0 refills | Status: AC

## 2017-10-26 MED ORDER — IPRATROPIUM-ALBUTEROL 0.5-2.5 (3) MG/3ML IN SOLN
3.0000 mL | Freq: Once | RESPIRATORY_TRACT | Status: AC
  Administered 2017-10-26: 3 mL via RESPIRATORY_TRACT
  Filled 2017-10-26: qty 3

## 2017-10-26 MED ORDER — PREDNISONE 20 MG PO TABS
60.0000 mg | ORAL_TABLET | Freq: Once | ORAL | Status: AC
  Administered 2017-10-26: 60 mg via ORAL
  Filled 2017-10-26: qty 6

## 2017-10-26 NOTE — ED Triage Notes (Signed)
Pt comes into the ED via POV c/o upper abdominal pain and chest pain.  Patient states it started two weeks ago with pressure and belching and it has still persisted.  Patient denies any nausea or vomiting, but has increased gas.  Patient denies any cardiac history.  Patient states she feels more short of breath than normal and she has a "swimmy head" feeling.  Patient has even and unlabored respirations at this time and in NAD.

## 2017-10-26 NOTE — ED Provider Notes (Signed)
[facility] Emergency Department Provider Note ____________________________________________   First MD Initiated Contact with Patient 10/26/17 1646     (approximate)  I have reviewed the triage vital signs and the nursing notes.   HISTORY  Chief Complaint Abdominal Pain and Chest Pain    HPI Alison Bradshaw is a 77 y.o. female with PMH as noted below who presents with epigastric pain which she states is chronic and related to ulcers and stomach acid, which is somewhat worsened over the last several weeks.  She reports associated shortness of breath especially with exertion and after being outside, and reports some increased pressure in her upper abdomen and belching.  The patient denies any specific chest pain.  She does feel somewhat lightheaded.  No fevers and no vomiting.  The patient was seen a few weeks ago and urgent care and  prescribed Carafate, which she states is only somewhat helpful.   Past Medical History:  Diagnosis Date  . Anxiety   . Asthma   . Chronic gastritis   . Duodenal ulcer   . GERD (gastroesophageal reflux disease)   . Osteopenia   . Rosacea   . Senile nuclear sclerosis   . 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   Past Surgical History:  Procedure Laterality Date  . BREAST BIOPSY Left 2008   neg  . BREAST EXCISIONAL BIOPSY Left 2004   neg  . BREAST SURGERY    . COLONOSCOPY WITH PROPOFOL N/A 01/22/2017   Procedure: COLONOSCOPY WITH PROPOFOL;  Surgeon: [place], Benay Pike, MD;  Location: [facility] ENDOSCOPY;  Service: Gastroenterology;  Laterality: N/A;  . ESOPHAGOGASTRODUODENOSCOPY (EGD) WITH PROPOFOL N/A 01/20/2015   Procedure: ESOPHAGOGASTRODUODENOSCOPY (EGD) WITH PROPOFOL;  Surgeon: Lollie Sails, MD;  Location: [facility] ENDOSCOPY;  Service: Endoscopy;  Laterality: N/A;  . ESOPHAGOGASTRODUODENOSCOPY (EGD) WITH PROPOFOL N/A 01/22/2017   Procedure:  ESOPHAGOGASTRODUODENOSCOPY (EGD) WITH PROPOFOL;  Surgeon: [place], Benay Pike, MD;  Location: [facility] ENDOSCOPY;  Service: Gastroenterology;  Laterality: N/A;  . EYE SURGERY    . TONSILLECTOMY      Prior to Admission medications   Medication Sig Start Date End Date Taking? Authorizing Provider  acetaminophen (TYLENOL) 500 MG tablet Take 1,000 mg by mouth every 6 (six) hours as needed.    [provider]  albuterol (PROVENTIL HFA;VENTOLIN HFA) 108 (90 Base) MCG/ACT inhaler Inhale 2 puffs into the lungs every 6 (six) hours as needed for shortness of breath. 10/26/17   Arta Silence, MD  ALBUTEROL IN Inhale 90 mcg into the lungs every 6 (six) hours as needed.    [provider]  ALPRAZolam Duanne Moron) 0.25 MG tablet Take 0.25 mg by mouth 2 (two) times daily as needed for anxiety.    [provider]  atorvastatin (LIPITOR) 10 MG tablet Take 10 mg by mouth daily.    [provider]  dicyclomine (BENTYL) 10 MG capsule Take 10 mg by mouth 4 (four) times daily -  before meals and at bedtime.    [provider]  esomeprazole (NEXIUM) 40 MG capsule Take 40 mg by mouth daily. 03/25/16   [provider]  ofloxacin (FLOXIN OTIC) 0.3 % otic solution Place 5 drops into the left ear daily. Patient not taking: Reported on 01/22/2017 04/10/15   Harvest Dark, MD  ondansetron (ZOFRAN ODT) 4 MG disintegrating tablet Take 1 tablet (4 mg total) by mouth every 8 (eight) hours as needed for nausea or vomiting. Patient not  taking: Reported on 01/22/2017 04/10/15   Harvest Dark, MD  oxyCODONE-acetaminophen (ROXICET) 5-325 MG tablet Take 1 tablet by mouth every 6 (six) hours as needed. Patient not taking: Reported on 01/22/2017 04/10/15   Harvest Dark, MD  predniSONE (DELTASONE) 20 MG tablet Take 3 tablets (60 mg total) by mouth daily with breakfast for 4 days. 10/27/17 10/31/17  Arta Silence, MD  sucralfate (CARAFATE) 1 G tablet Take 1 g by mouth 4 (four)  times daily -  with meals and at bedtime. Pt only takes when she's really needs it which isn't very often    [provider]    Allergies Ceftin [cefuroxime axetil] and Timentin [ticarcillin-pot clavulanate]  No family history on file.  Social History Social History   Tobacco Use  . Smoking status: Never Smoker  . Smokeless tobacco: Never Used  Substance Use Topics  . Alcohol use: Yes    Alcohol/week: 1.2 oz    Types: 2 Glasses of wine per week  . Drug use: No    Review of Systems  Constitutional: No fever. Eyes: No redness. ENT: No sore throat. Cardiovascular: Denies chest pain. Respiratory: Positive for shortness of breath. Gastrointestinal: Positive for nausea and epigastric discomfort. Genitourinary: Negative for flank pain.  Musculoskeletal: Negative for back pain. Skin: Negative for rash. Neurological: Negative for headache.   ____________________________________________   PHYSICAL EXAM:  VITAL SIGNS: ED Triage Vitals [10/26/17 1524]  Enc Vitals Group     BP (!) 112/50     Pulse Rate 62     Resp 16     Temp (!) 97.5 F (36.4 C)     Temp Source Oral     SpO2 100 %     Weight 150 lb (68 kg)     Height 5' 3.5" (1.613 m)     Head Circumference      Peak Flow      Pain Score 4     Pain Loc      Pain Edu?      Excl. in [place]?     Constitutional: Alert and oriented. Well appearing and in no acute distress. Eyes: Conjunctivae are normal.  Head: Atraumatic. Nose: No congestion/rhinnorhea. Mouth/Throat: Mucous membranes are moist.   Neck: Normal range of motion.  Cardiovascular: Normal rate, regular rhythm. Grossly normal heart sounds.  Good peripheral circulation. Respiratory: Normal respiratory effort.  No retractions. Lungs CTAB.  Slightly prolonged expiratory phase. Gastrointestinal: Soft and nontender.  Mild epigastric discomfort.  No distention.  Genitourinary: No flank tenderness. Musculoskeletal: No lower extremity edema.  Extremities  warm and well perfused.  Neurologic:  Normal speech and language. No gross focal neurologic deficits are appreciated.  Skin:  Skin is warm and dry. No rash noted. Psychiatric: Mood and affect are normal. Speech and behavior are normal.  ____________________________________________   LABS (all labs ordered are listed, but only abnormal results are displayed)  Labs Reviewed  BASIC METABOLIC PANEL - Abnormal; Notable for the following components:      Result Value   Potassium 3.4 (*)    Glucose, Bld 127 (*)    All other components within normal limits  CBC  TROPONIN I   ____________________________________________  EKG  ED ECG REPORT I, Arta Silence, the attending physician, personally viewed and interpreted this ECG.  Date: 10/26/2017 EKG Time: 1529 Rate: 57 Rhythm: normal sinus rhythm QRS Axis: normal Intervals: normal ST/T Wave abnormalities: normal Narrative Interpretation: no evidence of acute ischemia  ____________________________________________  RADIOLOGY  CXR: Chronic bronchitic changes  with no focal infiltrate  ____________________________________________   PROCEDURES  Procedure(s) performed: No  Procedures  Critical Care performed: No ____________________________________________   INITIAL IMPRESSION / ASSESSMENT AND PLAN / ED COURSE  Pertinent labs & imaging results that were available during my care of the patient were reviewed by me and considered in my medical decision making (see chart for details).  77 year old female with PMH as noted above presents with ongoing epigastric pain that occasionally radiates to her chest which she states is chronic but somewhat worsened recently, and related to GERD and peptic ulcers, with some intermittent shortness of breath with exertion, as well as some lightheadedness over the last several weeks.  On exam the patient is well-appearing.  Vital signs are normal.  The exam is otherwise unremarkable.  EKG  is normal.  Overall I suspect that this is an exacerbation of the patient's chronic GERD symptoms, however the shortness of breath could be due to cardiac or pulmonary etiology such as CHF, bronchitis, or pneumonia.  Also consider dehydration or other metabolic etiology.  Plan: Chest x-ray, labs including troponin, and reassess.  ----------------------------------------- 7:47 PM on 10/26/2017 -----------------------------------------  Chest x-ray shows chronic bronchitic changes, which could explain the patient's symptoms.  The labs are otherwise unremarkable.  There is no indication for repeat troponin given the duration of the symptoms.  I ordered prednisone and albuterol.  On reassessment, the patient reports significant improvement in her shortness of breath.  She feels well and would like to go home.  I counseled the patient on the results of the work-up and the suspicion for possible acute or chronic bronchitis.  We will therefore treat with albuterol and prednisone at home.  I instructed her to continue her normal medications and follow-up with her gastroenterologist and primary care doctor.  Return precautions given, and she expresses understanding. ____________________________________________   FINAL CLINICAL IMPRESSION(S) / ED DIAGNOSES  Final diagnoses:  Bronchitis  Gastroesophageal reflux disease, esophagitis presence not specified      NEW MEDICATIONS STARTED DURING THIS VISIT:  New Prescriptions   ALBUTEROL (PROVENTIL HFA;VENTOLIN HFA) 108 (90 BASE) MCG/ACT INHALER    Inhale 2 puffs into the lungs every 6 (six) hours as needed for shortness of breath.   PREDNISONE (DELTASONE) 20 MG TABLET    Take 3 tablets (60 mg total) by mouth daily with breakfast for 4 days.     Note:  This document was prepared using Dragon voice recognition software and may include unintentional dictation errors.    Arta Silence, MD 10/26/17 [phone]

## 2017-10-26 NOTE — Discharge Instructions (Signed)
Take the prednisone as prescribed starting tomorrow and finish the full course.  Use the albuterol inhaler every 4-6 hours as needed.  Continue to take your Nexium and Carafate.  Make an appointment to follow-up with your primary care doctor and your gastroenterologist in the next 1 to 2 weeks.  Return to the ER for new, worsening, persistent severe difficulty breathing, chest pain, weakness or lightheadedness, fevers, or any other new or worsening symptoms that concern you.

## 2017-10-30 ENCOUNTER — Other Ambulatory Visit: Payer: Self-pay | Admitting: Gastroenterology

## 2017-10-30 DIAGNOSIS — R1013 Epigastric pain: Secondary | ICD-10-CM

## 2017-10-30 DIAGNOSIS — R1011 Right upper quadrant pain: Secondary | ICD-10-CM

## 2017-10-31 ENCOUNTER — Other Ambulatory Visit
Admission: RE | Admit: 2017-10-31 | Discharge: 2017-10-31 | Disposition: A | Discharge status: Home / Self Care | Payer: Medicare Other | Source: Ambulatory Visit | Attending: Gastroenterology | Admitting: Gastroenterology

## 2017-10-31 DIAGNOSIS — R195 Other fecal abnormalities: Secondary | ICD-10-CM | POA: Diagnosis present

## 2017-11-02 LAB — MISC LABCORP TEST (SEND OUT): LABCORP TEST NAME: 86207

## 2017-11-03 ENCOUNTER — Ambulatory Visit
Admission: RE | Admit: 2017-11-03 | Discharge: 2017-11-03 | Disposition: A | Discharge status: Home / Self Care | Payer: Medicare Other | Source: Ambulatory Visit | Attending: Gastroenterology | Admitting: Gastroenterology

## 2017-11-03 DIAGNOSIS — R1013 Epigastric pain: Secondary | ICD-10-CM | POA: Diagnosis not present

## 2017-11-03 DIAGNOSIS — R195 Other fecal abnormalities: Secondary | ICD-10-CM | POA: Insufficient documentation

## 2017-11-03 DIAGNOSIS — R1011 Right upper quadrant pain: Secondary | ICD-10-CM

## 2017-11-14 ENCOUNTER — Encounter

## 2017-11-14 ENCOUNTER — Encounter: Payer: Self-pay | Admitting: Pulmonary Disease

## 2017-11-14 ENCOUNTER — Ambulatory Visit: Payer: Medicare Other | Admitting: Pulmonary Disease

## 2017-11-14 VITALS — BP 116/78 | HR 71 | Ht 63.5 in | Wt 151.0 lb

## 2017-11-14 DIAGNOSIS — R0609 Other forms of dyspnea: Secondary | ICD-10-CM

## 2017-11-14 DIAGNOSIS — R0789 Other chest pain: Secondary | ICD-10-CM

## 2017-11-14 DIAGNOSIS — J449 Chronic obstructive pulmonary disease, unspecified: Secondary | ICD-10-CM

## 2017-11-14 MED ORDER — FLUTICASONE-SALMETEROL 113-14 MCG/ACT IN AEPB: 1.0000 | INHALATION_SPRAY | Freq: Two times a day (BID) | RESPIRATORY_TRACT | 10 refills | Status: DC

## 2017-11-14 NOTE — Patient Instructions (Signed)
Begin AirDuo inhaler, 1 inhalation twice a day.  Rinse mouth after use  Continue albuterol inhaler as needed for increased shortness of breath, wheezing, chest tightness, cough  You may use albuterol inhaler, 1 actuation, prior to exertion (for example walking the dogs)  Lung function tests ordered to be performed prior to follow-up visit  Follow up in 6 to 8 weeks

## 2017-11-16 NOTE — Progress Notes (Signed)
PULMONARY CONSULT NOTE  Requesting MD/Service: Self Date of initial consultation: 11/14/17 Reason for consultation: SOB, cough  PT PROFILE: 77 y.o. female with very minimal smoking history (1 cigarette daily for 20 years, none sine 2000) with frequent bronchitis as adult but no other pulmonary diagnoses  DATA:  INTERVAL:  HPI:  As above. She notes a sense of mild breathlessness X 6 months with some day to day variation and 6 weeks of cough productive of scant mucus and chest tightness. Symptoms are worse in hot, humid weather. She has also noted palpitations and has recently undergone Holter resulting in Rx of metoprolol. She has never undergone PFTs. She has no significant occupational exposures. She has a dog in the home. She has no significant travel history. She has been prescribed albuterol which provides transient relief.  Past Medical History:  Diagnosis Date  . Anxiety   . Asthma   . Chronic gastritis   . Duodenal ulcer   . GERD (gastroesophageal reflux disease)   . Osteopenia   . Rosacea   . Senile nuclear sclerosis   . 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 BREAST BIOPSY Left 2008   neg  . BREAST EXCISIONAL BIOPSY Left 2004   neg  . BREAST SURGERY    . COLONOSCOPY WITH PROPOFOL N/A 01/22/2017   Procedure: COLONOSCOPY WITH PROPOFOL;  Surgeon: Toledo, Benay Pike, MD;  Location: [facility] ENDOSCOPY;  Service: Gastroenterology;  Laterality: N/A;  . ESOPHAGOGASTRODUODENOSCOPY (EGD) WITH PROPOFOL N/A 01/20/2015   Procedure: ESOPHAGOGASTRODUODENOSCOPY (EGD) WITH PROPOFOL;  Surgeon: Lollie Sails, MD;  Location: [facility] ENDOSCOPY;  Service: Endoscopy;  Laterality: N/A;  . ESOPHAGOGASTRODUODENOSCOPY (EGD) WITH PROPOFOL N/A 01/22/2017   Procedure: ESOPHAGOGASTRODUODENOSCOPY (EGD) WITH PROPOFOL;  Surgeon: Toledo, Benay Pike, MD;  Location: [facility] ENDOSCOPY;  Service: Gastroenterology;  Laterality: N/A;  . EYE SURGERY    .  TONSILLECTOMY      MEDICATIONS: I have reviewed all medications and confirmed regimen as documented  Social History   Socioeconomic History  . Marital status: Married    Spouse name: Not on file  . Number of children: Not on file  . Years of education: Not on file  . Highest education level: Not on file  Occupational History  . Not on file  Social Needs  . Financial resource strain: Not on file  . Food insecurity:    Worry: Not on file    Inability: Not on file  . Transportation needs:    Medical: Not on file    Non-medical: Not on file  Tobacco Use  . Smoking status: Never Smoker  . Smokeless tobacco: Never Used  Substance and Sexual Activity  . Alcohol use: Yes    Alcohol/week: 2.0 standard drinks    Types: 2 Glasses of wine per week  . Drug use: No  . Sexual activity: Not on file  Lifestyle  . Physical activity:    Days per week: Not on file    Minutes per session: Not on file  . Stress: Not on file  Relationships  . Social connections:    Talks on phone: Not on file    Gets together: Not on file    Attends religious service: Not on file    Active member of club or organization: Not on file    Attends meetings of clubs or organizations: Not on file    Relationship status: Not on file  . Intimate partner violence:    Fear of current  or ex partner: Not on file    Emotionally abused: Not on file    Physically abused: Not on file    Forced sexual activity: Not on file  Other Topics Concern  . Not on file  Social History Narrative  . Not on file    History reviewed. No pertinent family history.  ROS: No fever, myalgias/arthralgias, unexplained weight loss or weight gain No new focal weakness or sensory deficits No otalgia, hearing loss, visual changes, nasal and sinus symptoms, mouth and throat problems No neck pain or adenopathy No abdominal pain, N/V/D, diarrhea, change in bowel pattern No dysuria, change in urinary pattern   Vitals:   11/14/17 1028  11/14/17 1032  BP:  116/78  Pulse:  71  SpO2:  99%  Weight: 151 lb (68.5 kg)   Height: 5' 3.5" (1.613 m)   RA  EXAM:  Gen: WDWN, No overt respiratory distress HEENT: NCAT, sclera white, oropharynx normal Neck: Supple without LAN, thyromegaly, JVD Lungs: breath sounds full, percussion normal, very faint focal crackles in R base, no wheezes  Cardiovascular: RRR, no murmurs noted Abdomen: Soft, nontender, normal BS Ext: without clubbing, cyanosis, edema Neuro: CNs grossly intact, motor and sensory intact Skin: Limited exam, no lesions noted  DATA:   BMP Latest Ref Rng & Units 10/26/2017 04/29/2016  Glucose 70 - 99 mg/dL 127(H) 104(H)  BUN 8 - 23 mg/dL 12 15  Creatinine 0.44 - 1.00 mg/dL 0.74 0.79  Sodium 135 - 145 mmol/L 135 136  Potassium 3.5 - 5.1 mmol/L 3.4(L) 3.6  Chloride 98 - 111 mmol/L 100 102  CO2 22 - 32 mmol/L 26 27  Calcium 8.9 - 10.3 mg/dL 8.9 9.0    CBC Latest Ref Rng & Units 10/26/2017 04/29/2016  WBC 3.6 - 11.0 K/uL 5.6 14.0(H)  Hemoglobin 12.0 - 16.0 g/dL 13.7 13.5  Hematocrit 35.0 - 47.0 % 38.5 38.0  Platelets 150 - 440 K/uL 193 134(L)    CXR 10/26/17:  NACPD   I have personally reviewed all chest radiographs reported above including CXRs and CT chest unless otherwise indicated  IMPRESSION:     ICD-10-CM   1. DOE (dyspnea on exertion) R06.09 Pulmonary Function Test [facility] Only  2. Chest tightness R07.89 Pulmonary Function Test [facility] Only  3. 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  Rinse mouth after use  Continue albuterol inhaler as needed for increased shortness of breath, wheezing, chest tightness, cough  You may use albuterol inhaler, 1 actuation, prior to exertion (for example walking the dogs)  Lung function tests ordered to be performed prior to follow-up visit  Follow up in 6 to 8 weeks   Merton Border, MD PCCM service Mobile  [phone] Pager [phone] 11/16/2017 5:18 PM

## 2017-12-09 ENCOUNTER — Ambulatory Visit: Payer: Medicare Other | Admitting: Cardiovascular Disease

## 2017-12-09 ENCOUNTER — Encounter: Payer: Self-pay | Admitting: Cardiovascular Disease

## 2017-12-09 ENCOUNTER — Encounter

## 2017-12-09 VITALS — BP 104/62 | HR 60 | Ht 63.5 in | Wt 150.5 lb

## 2017-12-09 DIAGNOSIS — R072 Precordial pain: Secondary | ICD-10-CM | POA: Diagnosis not present

## 2017-12-09 DIAGNOSIS — R002 Palpitations: Secondary | ICD-10-CM | POA: Diagnosis not present

## 2017-12-09 DIAGNOSIS — E785 Hyperlipidemia, unspecified: Secondary | ICD-10-CM

## 2017-12-09 NOTE — Progress Notes (Signed)
Cardiology Office Note   Date:  12/09/2017   ID:  Alison Bradshaw, DOB 09-14-1940, MRN [id number]  PCP:  Glendon Axe, MD  Cardiologist:   Kathlyn Sacramento, MD   Chief Complaint  Patient presents with  . other    Self ref for chest pain due to being in the ED 1 month ago. Meds reviewed by the pt. verbally.  Pt. c/o chest pain with feeling tired and difficulty breathing at times.       History of Present Illness: TAYLLOR Bradshaw is a 77 y.o. female who presents for evaluation of chest pain.  She has no prior cardiac history.  She has known history of GERD, previous slight tobacco use and hyperlipidemia.  There is no family history of coronary artery disease. She had an episode of chest pain and shortness of breath on July 21 and went to the emergency room.  Basic cardiac work-up was negative.  Chest x-ray showed chronic bronchitis changes.  She was seen by Dr. Shawna Orleans with plans for PFTs.  Her inhalers were adjusted but she had minimal improvement in symptoms. The chest pain was substernal described as tightness feeling radiating to her face.  She was given GI cocktail with only partial improvement in symptoms.  She also complained of palpitations.  She underwent a treadmill echocardiogram at [facility] which showed no evidence of ischemia.  Ejection fraction was normal with only mild mitral regurgitation according to the report.  Holter monitor showed short runs of SVT the longest was 5 beats.  She was placed on small dose Toprol 12.5 mg once daily but she did not tolerate the medication due to dizziness.  Her blood pressure tends to be on the low side.  She continues to have this intermittent chest pain with worsening exertional dyspnea.    Past Medical History:  Diagnosis Date  . Anxiety   . Asthma   . Chronic gastritis   . Duodenal ulcer   . GERD (gastroesophageal reflux disease)   . Osteopenia   . Rosacea   . Senile nuclear sclerosis   . 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 BREAST BIOPSY Left 2008   neg  . BREAST EXCISIONAL BIOPSY Left 2004   neg  . BREAST SURGERY    . COLONOSCOPY WITH PROPOFOL N/A 01/22/2017   Procedure: COLONOSCOPY WITH PROPOFOL;  Surgeon: Toledo, Benay Pike, MD;  Location: [facility] ENDOSCOPY;  Service: Gastroenterology;  Laterality: N/A;  . ESOPHAGOGASTRODUODENOSCOPY (EGD) WITH PROPOFOL N/A 01/20/2015   Procedure: ESOPHAGOGASTRODUODENOSCOPY (EGD) WITH PROPOFOL;  Surgeon: Lollie Sails, MD;  Location: [facility] ENDOSCOPY;  Service: Endoscopy;  Laterality: N/A;  . ESOPHAGOGASTRODUODENOSCOPY (EGD) WITH PROPOFOL N/A 01/22/2017   Procedure: ESOPHAGOGASTRODUODENOSCOPY (EGD) WITH PROPOFOL;  Surgeon: Toledo, Benay Pike, MD;  Location: [facility] ENDOSCOPY;  Service: Gastroenterology;  Laterality: N/A;  . EYE SURGERY    . TONSILLECTOMY       Current Outpatient Medications  Medication Sig Dispense Refill  . acetaminophen (TYLENOL) 500 MG tablet Take 1,000 mg by mouth every 6 (six) hours as needed.    Marland Kitchen albuterol (PROVENTIL HFA;VENTOLIN HFA) 108 (90 Base) MCG/ACT inhaler Inhale 2 puffs into the lungs every 6 (six) hours as needed for shortness of breath. 1 Inhaler 0  . ALPRAZolam (XANAX) 0.25 MG tablet Take 0.25 mg by mouth 2 (two) times daily as needed for anxiety.    Marland Kitchen atorvastatin (LIPITOR) 10 MG tablet Take 10 mg by mouth daily.    Marland Kitchen  dicyclomine (BENTYL) 10 MG capsule Take 10 mg by mouth 4 (four) times daily -  before meals and at bedtime.    Marland Kitchen esomeprazole (NEXIUM) 40 MG capsule Take 40 mg by mouth daily.  3  . Fluticasone-Salmeterol (AIRDUO RESPICLICK 259/56) 387-56 MCG/ACT AEPB Inhale 1 puff into the lungs 2 (two) times daily. 1 each 10  . ofloxacin (FLOXIN OTIC) 0.3 % otic solution Place 5 drops into the left ear daily. 5 mL 0  . ondansetron (ZOFRAN ODT) 4 MG disintegrating tablet Take 1 tablet (4 mg total) by mouth every 8 (eight) hours as needed for nausea or vomiting. 20 tablet 0  .  oxyCODONE-acetaminophen (ROXICET) 5-325 MG tablet Take 1 tablet by mouth every 6 (six) hours as needed. 20 tablet 0  . sucralfate (CARAFATE) 1 G tablet Take 1 g by mouth 4 (four) times daily -  with meals and at bedtime. Pt only takes when she's really needs it which isn't very often     No current facility-administered medications for this visit.     Allergies:   Ceftin [cefuroxime axetil] and Timentin [ticarcillin-pot clavulanate]    Social History:  The patient  reports that she has never smoked. She has never used smokeless tobacco. She reports that she drank about 2.0 standard drinks of alcohol per week. She reports that she does not use drugs.   Family History:  The patient's family history includes AAA (abdominal aortic aneurysm) in her father; Alzheimer's disease in her mother; Emphysema in her father; Hyperlipidemia in her mother; Kidney cancer in her brother; Pancreatic cancer in her paternal uncle.    ROS:  Please see the history of present illness.   Otherwise, review of systems are positive for none.   All other systems are reviewed and negative.    PHYSICAL EXAM: VS:  BP 104/62 (BP Location: Right Arm, Patient Position: Sitting, Cuff Size: Normal)   Pulse 60   Ht 5' 3.5" (1.613 m)   Wt 150 lb 8 oz (68.3 kg)   BMI 26.24 kg/m  , BMI Body mass index is 26.24 kg/m. GEN: Well nourished, well developed, in no acute distress  HEENT: normal  Neck: no JVD, carotid bruits, or masses Cardiac: RRR; no murmurs, rubs, or gallops,no edema  Respiratory:  clear to auscultation bilaterally, normal work of breathing GI: soft, nontender, nondistended, + BS MS: no deformity or atrophy  Skin: warm and dry, no rash Neuro:  Strength and sensation are intact Psych: euthymic mood, full affect   EKG:  EKG is ordered today. The ekg ordered today demonstrates normal sinus rhythm with no significant ST or T wave changes.   Recent Labs: 10/26/2017: BUN 12; Creatinine, Ser 0.74; Hemoglobin  13.7; Platelets 193; Potassium 3.4; Sodium 135    Lipid Panel No results found for: CHOL, TRIG, HDL, CHOLHDL, VLDL, LDLCALC, LDLDIRECT    Wt Readings from Last 3 Encounters:  12/09/17 150 lb 8 oz (68.3 kg)  11/14/17 151 lb (68.5 kg)  10/26/17 150 lb (68 kg)      No flowsheet data found.    ASSESSMENT AND PLAN:  1.  Chest pain and exertional dyspnea: The patient continues to have the symptoms and she has multiple risk factors for coronary artery disease.  Given persistent symptoms, I am still not reassured by recent stress echo results.  Due to that, I recommend evaluation with CT coronary angiography with FFR if needed.  She is not allergic to contrast.  Resting heart rate is close to 60  and thus she does not require a beta-blocker especially that her blood pressure tends to be low. The CT should also help [place] evaluate her lungs given concerns about possible chronic bronchitis.  2.  Palpitations: Overall symptoms seem to be mild.  Holter monitor showed only short runs of SVT.  I do not think this requires treatment at the present time.  3.  Hyperlipidemia: Currently on atorvastatin 10 mg daily.    Disposition:   FU with me as needed  Signed,  Kathlyn Sacramento, MD  12/09/2017 5:22 PM    [facility]

## 2017-12-09 NOTE — Patient Instructions (Addendum)
Medication Instructions: Your physician recommends that you continue on your current medications as directed. Please refer to the Current Medication list given to you today.  If you need a refill on your cardiac medications before your next appointment, please call your pharmacy.   Procedures/Testing: Your physician has requested that you have cardiac CT. Cardiac computed tomography (CT) is a painless test that uses an x-ray machine to take clear, detailed pictures of your heart. For further information please visit [email or web address]. Please follow instruction sheet as given.   Follow-Up: Your physician wants you to follow-up as needed with Dr. Fletcher Anon.  Special Instructions: Please arrive at the [facility] main entrance of [facility] at xx:xx AM (30-45 minutes prior to test start time)  [facility] [place], [place] [address] [phone]  Proceed to the [facility] Radiology Department (First Floor).  Please follow these instructions carefully (unless otherwise directed):  On the Night Before the Test: . Drink plenty of water. . Do not consume any caffeinated/decaffeinated beverages or chocolate 12 hours prior to your test. . Do not take any antihistamines 12 hours prior to your test.   On the Day of the Test: . Drink plenty of water. Do not drink any water within one hour of the test. . Do not eat any food 4 hours prior to the test. . You may take your regular medications prior to the test.  After the Test: . Drink plenty of water. . After receiving IV contrast, you may experience a mild flushed feeling. This is normal. . On occasion, you may experience a mild rash up to 24 hours after the test. This is not dangerous. If this occurs, you can take Benadryl 25 mg and increase your fluid intake. . If you experience trouble breathing, this can be serious. If it is severe call 911 IMMEDIATELY. If it is mild, please call our office. . If you  take any of these medications: Glipizide/Metformin, Avandament, Glucavance, please do not take 48 hours after completing test.     Thank you for choosing [facility] at [facility]!

## 2017-12-18 ENCOUNTER — Ambulatory Visit: Payer: Medicare Other

## 2017-12-19 ENCOUNTER — Ambulatory Visit: Payer: Medicare Other | Attending: Pulmonary Disease

## 2017-12-19 DIAGNOSIS — J449 Chronic obstructive pulmonary disease, unspecified: Secondary | ICD-10-CM | POA: Insufficient documentation

## 2017-12-19 DIAGNOSIS — R0789 Other chest pain: Secondary | ICD-10-CM | POA: Diagnosis present

## 2017-12-19 DIAGNOSIS — R0609 Other forms of dyspnea: Secondary | ICD-10-CM | POA: Diagnosis present

## 2017-12-19 MED ORDER — ALBUTEROL SULFATE (2.5 MG/3ML) 0.083% IN NEBU
2.5000 mg | INHALATION_SOLUTION | Freq: Once | RESPIRATORY_TRACT | Status: AC
  Administered 2017-12-19: 2.5 mg via RESPIRATORY_TRACT
  Filled 2017-12-19: qty 3

## 2017-12-22 ENCOUNTER — Encounter: Payer: Self-pay | Admitting: Pulmonary Disease

## 2017-12-22 ENCOUNTER — Ambulatory Visit: Payer: Medicare Other | Admitting: Pulmonary Disease

## 2017-12-22 VITALS — BP 140/90 | HR 64 | Ht 63.5 in | Wt 149.0 lb

## 2017-12-22 DIAGNOSIS — J4530 Mild persistent asthma, uncomplicated: Secondary | ICD-10-CM | POA: Diagnosis not present

## 2017-12-22 DIAGNOSIS — J310 Chronic rhinitis: Secondary | ICD-10-CM | POA: Diagnosis not present

## 2017-12-22 MED ORDER — FLUTICASONE FUROATE 27.5 MCG/SPRAY NA SUSP
2.0000 | Freq: Every day | NASAL | 12 refills | Status: DC
Start: 2017-12-22 — End: 2020-10-12

## 2017-12-22 NOTE — Patient Instructions (Signed)
Continue AirDuo inhaler as previously prescribed.  Rinse mouth after use  Continue albuterol inhaler as needed for increased shortness of breath, cough, wheezing, chest tightness  Try using albuterol inhaler in anticipation of singing or exercise  For nasal symptoms, recommend Flonase Sensimist, 1 spray per nostril daily  Follow-up in 3 to 4 months.  Call sooner if needed

## 2017-12-22 NOTE — Progress Notes (Signed)
PULMONARY OFFICE FOLLOW-UP NOTE  Requesting MD/Service: Self Date of initial consultation: 11/14/17 Reason for consultation: SOB, cough  PT PROFILE: 77 y.o. female with very minimal smoking history (1 cigarette daily for 20 years, none sine 2000) with frequent bronchitis as adult but no other pulmonary diagnoses  DATA: 12/19/17 PFTs: FVC: 3.05 L (115 %pred), FEV1: 1.81 L (89 %pred), FEV1/FVC: 60%, TLC: 4.64 L (93 %pred), DLCO 82 %pred   INTERVAL: No major events   SUBJ:  This is a scheduled follow-up.  Last visit, she was started on Advair generic equivalent.  She has used this compliantly.  She believes there has been modest improvement in exertional dyspnea and chest tightness.  She continues to report frequent accumulation of mucus in her throat.  She believes this might be coming from posterior nasal drainage.  She continues to have mild exertional dyspnea with chest tightness, although improved from previously.  She also notes some difficulty singing in the church choir which she attributes to the mucus accumulation in her throat.  She denies CP, fever, purulent sputum, hemoptysis, LE edema and calf tenderness.   Vitals:   12/22/17 1329 12/22/17 1337  BP:  140/90  Pulse:  64  SpO2:  98%  Weight: 149 lb (67.6 kg)   Height: 5' 3.5" (1.613 m)   RA  EXAM:  Gen: NAD HEENT: NCAT, sclera white Neck: No JVD Lungs: breath sounds full, no wheezes or other adventitious sounds Cardiovascular: RRR, no murmurs Abdomen: Soft, nontender, normal BS Ext: without clubbing, cyanosis, edema Neuro: grossly intact Skin: Limited exam, no lesions noted   DATA:   BMP Latest Ref Rng & Units 10/26/2017 04/29/2016  Glucose 70 - 99 mg/dL 127(H) 104(H)  BUN 8 - 23 mg/dL 12 15  Creatinine 0.44 - 1.00 mg/dL 0.74 0.79  Sodium 135 - 145 mmol/L 135 136  Potassium 3.5 - 5.1 mmol/L 3.4(L) 3.6  Chloride 98 - 111 mmol/L 100 102  CO2 22 - 32 mmol/L 26 27  Calcium 8.9 - 10.3 mg/dL 8.9 9.0    CBC  Latest Ref Rng & Units 10/26/2017 04/29/2016  WBC 3.6 - 11.0 K/uL 5.6 14.0(H)  Hemoglobin 12.0 - 16.0 g/dL 13.7 13.5  Hematocrit 35.0 - 47.0 % 38.5 38.0  Platelets 150 - 440 K/uL 193 134(L)    CXR:  NNF  I have personally reviewed all chest radiographs reported above including CXRs and CT chest unless otherwise indicated  IMPRESSION:     ICD-10-CM   1. Mild persistent asthma, uncomplicated Q11.94   2. Chronic rhinitis J31.0      PLAN:  Continue AirDuo inhaler as previously prescribed.  Rinse mouth after use  Continue albuterol inhaler as needed for increased shortness of breath, cough, wheezing, chest tightness  Recommended that she try using albuterol inhaler in anticipation of singing or exercise  For nasal symptoms, recommended Flonase Sensimist, 1 spray per nostril daily  Follow-up in 3 to 4 months.  Call sooner if needed  Merton Border, MD PCCM service Mobile [phone] Pager [phone] 12/22/2017 2:21 PM

## 2017-12-23 ENCOUNTER — Telehealth: Payer: Self-pay | Admitting: *Deleted

## 2017-12-23 NOTE — Telephone Encounter (Signed)
Patient returning call.

## 2017-12-23 NOTE — Telephone Encounter (Signed)
Left a message to call back. The patient will need to have her labs drawn prior to her cardiac ct on 9/30

## 2017-12-23 NOTE — Telephone Encounter (Signed)
Patient will be having labs drawn at [facility] on 9/23. She will call back tomorrow to confirm this.

## 2017-12-24 NOTE — Telephone Encounter (Signed)
Patient calling with the labs she is to do at [facility]  CBC CMP Thyroid Urinalysis   Please advise if she needs to do anything else.

## 2017-12-24 NOTE — Telephone Encounter (Signed)
Patient will be having the lab work on 12/29/17 at the [facility]. If they are not available in Epic then they will be faxed.

## 2017-12-25 ENCOUNTER — Ambulatory Visit: Payer: Medicare Other | Admitting: Pulmonary Disease

## 2017-12-29 ENCOUNTER — Ambulatory Visit: Payer: Medicare Other | Admitting: Cardiovascular Disease

## 2017-12-30 ENCOUNTER — Other Ambulatory Visit: Payer: Self-pay | Admitting: Internal Medicine

## 2017-12-30 DIAGNOSIS — Z1231 Encounter for screening mammogram for malignant neoplasm of breast: Secondary | ICD-10-CM

## 2018-01-01 NOTE — Telephone Encounter (Signed)
Call has been placed to have labs faxed from [employer].  They have been scanned into the patient's chart.

## 2018-01-05 ENCOUNTER — Other Ambulatory Visit: Payer: Self-pay | Admitting: Internal Medicine

## 2018-01-05 ENCOUNTER — Ambulatory Visit (HOSPITAL_COMMUNITY)
Admission: RE | Admit: 2018-01-05 | Discharge: 2018-01-05 | Disposition: A | Discharge status: Home / Self Care | Payer: Medicare Other | Source: Ambulatory Visit | Attending: Cardiovascular Disease | Admitting: Cardiovascular Disease

## 2018-01-05 DIAGNOSIS — R072 Precordial pain: Secondary | ICD-10-CM

## 2018-01-05 DIAGNOSIS — Z780 Asymptomatic menopausal state: Secondary | ICD-10-CM | POA: Insufficient documentation

## 2018-01-05 DIAGNOSIS — R911 Solitary pulmonary nodule: Secondary | ICD-10-CM | POA: Insufficient documentation

## 2018-01-05 DIAGNOSIS — I2510 Atherosclerotic heart disease of native coronary artery without angina pectoris: Secondary | ICD-10-CM | POA: Insufficient documentation

## 2018-01-05 LAB — POCT I-STAT CREATININE: Creatinine, Ser: 0.7 mg/dL (ref 0.44–1.00)

## 2018-01-05 MED ORDER — IOPAMIDOL (ISOVUE-370) INJECTION 76%
80.0000 mL | Freq: Once | INTRAVENOUS | Status: AC | PRN
  Administered 2018-01-05: 80 mL via INTRAVENOUS

## 2018-01-05 MED ORDER — NITROGLYCERIN 0.4 MG SL SUBL
SUBLINGUAL_TABLET | SUBLINGUAL | Status: AC
  Administered 2018-01-05: 0.8 mg
  Filled 2018-01-05: qty 2

## 2018-01-08 ENCOUNTER — Telehealth: Payer: Self-pay | Admitting: *Deleted

## 2018-01-08 DIAGNOSIS — E785 Hyperlipidemia, unspecified: Secondary | ICD-10-CM

## 2018-01-08 MED ORDER — ATORVASTATIN CALCIUM 20 MG PO TABS: 20.0000 mg | ORAL_TABLET | Freq: Every day | ORAL | Status: DC

## 2018-01-08 MED ORDER — ASPIRIN EC 81 MG PO TBEC: 81.0000 mg | DELAYED_RELEASE_TABLET | Freq: Every day | ORAL | 3 refills | Status: DC

## 2018-01-08 NOTE — Telephone Encounter (Signed)
Patient made aware of recommendations. She will call back in a few weeks with an update.   She will try the 81 mg aspirin and 20 mg Atorvastatin. Repeat labs in 6 weeks.

## 2018-01-08 NOTE — Telephone Encounter (Signed)
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  We do need to be more aggressive treating her risk factors.  I recommend that she start taking aspirin 81 mg daily and increase atorvastatin to 40 mg daily.  Repeat lipid and liver profile in 6 weeks.  The patient should follow-up with me in 6 months.  There was an incidental finding of a pulmonary nodule.  I will forward this to Dr. Alva Garnet to get his opinion and see if a repeat CT scan is needed in 6 months.

## 2018-01-08 NOTE — Telephone Encounter (Signed)
Patient made aware of results and verbalized understanding.  She stated that she was hesitant to start Aspirin 81 mg due to stomach upset when she had taken it previously. She would be willing to try it again if needed.  She stated that she was also hesitant about increasing the Atorvastatin to 40 mg from 10 mg. She would like to know if she can titrate it up to see if she has any side effects.

## 2018-01-08 NOTE — Telephone Encounter (Signed)
She should try taking aspirin for a week and see how she feels with that.  If it upsets her stomach she can stop it. If she wants to increase atorvastatin to 20 mg daily that is fine with me.

## 2018-01-12 ENCOUNTER — Telehealth: Payer: Self-pay | Admitting: Cardiovascular Disease

## 2018-01-12 NOTE — Telephone Encounter (Signed)
Spoke with patient and she was wanting to follow up on the nodule that was seen on recent scan. She thought she had called pulmonary office stating that she sees Dr. Alva Garnet. Advised that I would route this message over to their office for review. She was appreciative for the call with no further questions.

## 2018-01-12 NOTE — Telephone Encounter (Signed)
Please call to discuss pulmonary findings on the Cardiac CT.

## 2018-01-14 ENCOUNTER — Telehealth: Payer: Self-pay | Admitting: Cardiovascular Disease

## 2018-01-14 NOTE — Telephone Encounter (Signed)
Nothing to do regarding the new finding of a pulmonary nodule. It is likely simply an incidental finding and benign. We will discuss at follow up visit with plan to repeat the chest CT in 3-6 months  Alison Bradshaw

## 2018-01-14 NOTE — Telephone Encounter (Signed)
Pt aware.

## 2018-01-14 NOTE — Telephone Encounter (Signed)
° °  [facility] Pre-operative Risk Assessment    Request for surgical clearance:  1. What type of surgery is being performed? egd  2. When is this surgery scheduled? 02/12/18  3. What type of clearance is required (medical clearance vs. Pharmacy clearance to hold med vs. Both)? Medical   4. Are there any medications that need to be held prior to surgery and how long? Not noted   5. Practice name and name of physician performing surgery? [facility] Dr. Gustavo Lah   6. What is your office phone number [phone]    7.   What is your office fax number [phone]  8.   Anesthesia type (None, local, MAC, general) ? Monitored anesthesia   Clarisse Gouge 01/14/2018, 2:53 PM  _________________________________________________________________   (provider comments below)

## 2018-01-14 NOTE — Telephone Encounter (Signed)
Routing to Dr Arida.  

## 2018-01-15 NOTE — Telephone Encounter (Signed)
Low risk

## 2018-01-15 NOTE — Telephone Encounter (Signed)
Clearance routed to number listed. 

## 2018-02-06 ENCOUNTER — Telehealth: Payer: Self-pay | Admitting: Cardiovascular Disease

## 2018-02-06 MED ORDER — ATORVASTATIN CALCIUM 40 MG PO TABS: 40.0000 mg | ORAL_TABLET | Freq: Every day | ORAL | 1 refills | Status: DC

## 2018-02-06 NOTE — Telephone Encounter (Signed)
Pt would like a different rx. States her atorvastatin has increased and needs the mg/doseage changed. Please call to discuss.

## 2018-02-06 NOTE — Telephone Encounter (Signed)
RETURNED CALL TO PT she states that Dr A told her to titrate up her atorvastatin from 10 to 40 mg. She states that she has taken the increased dose of the 20mg  now she is up to the 30mg  on her 2nd day and is running out of medications. She states that she has to take a few more days of the 30mg  then up to the 40mg  QD and she needs a refill. Sent new rx for 40 mg. This should take care of the pt, she will CB if anything else is needed. She verbalizes understanding.

## 2018-02-08 ENCOUNTER — Other Ambulatory Visit: Payer: Self-pay

## 2018-02-08 ENCOUNTER — Emergency Department
Admission: EM | Admit: 2018-02-08 | Discharge: 2018-02-08 | Disposition: A | Discharge status: Home / Self Care | Payer: Medicare Other | Attending: Emergency Medicine | Admitting: Emergency Medicine

## 2018-02-08 ENCOUNTER — Encounter: Payer: Self-pay | Admitting: *Deleted

## 2018-02-08 ENCOUNTER — Emergency Department: Payer: Medicare Other

## 2018-02-08 DIAGNOSIS — Y92410 Unspecified street and highway as the place of occurrence of the external cause: Secondary | ICD-10-CM | POA: Diagnosis not present

## 2018-02-08 DIAGNOSIS — Z79899 Other long term (current) drug therapy: Secondary | ICD-10-CM | POA: Insufficient documentation

## 2018-02-08 DIAGNOSIS — S52351A Displaced comminuted fracture of shaft of radius, right arm, initial encounter for closed fracture: Secondary | ICD-10-CM | POA: Insufficient documentation

## 2018-02-08 DIAGNOSIS — J45909 Unspecified asthma, uncomplicated: Secondary | ICD-10-CM | POA: Insufficient documentation

## 2018-02-08 DIAGNOSIS — Z7982 Long term (current) use of aspirin: Secondary | ICD-10-CM | POA: Diagnosis not present

## 2018-02-08 DIAGNOSIS — S52501A Unspecified fracture of the lower end of right radius, initial encounter for closed fracture: Secondary | ICD-10-CM

## 2018-02-08 DIAGNOSIS — Y999 Unspecified external cause status: Secondary | ICD-10-CM | POA: Diagnosis not present

## 2018-02-08 DIAGNOSIS — Y9389 Activity, other specified: Secondary | ICD-10-CM | POA: Insufficient documentation

## 2018-02-08 DIAGNOSIS — S52614A Nondisplaced fracture of right ulna styloid process, initial encounter for closed fracture: Secondary | ICD-10-CM | POA: Diagnosis not present

## 2018-02-08 DIAGNOSIS — S59911A Unspecified injury of right forearm, initial encounter: Secondary | ICD-10-CM | POA: Diagnosis present

## 2018-02-08 MED ORDER — OXYCODONE-ACETAMINOPHEN 5-325 MG PO TABS: 1.0000 | ORAL_TABLET | ORAL | 0 refills | Status: DC | PRN

## 2018-02-08 MED ORDER — FENTANYL CITRATE (PF) 100 MCG/2ML IJ SOLN
50.0000 ug | Freq: Once | INTRAMUSCULAR | Status: AC
  Administered 2018-02-08: 50 ug via INTRAMUSCULAR
  Filled 2018-02-08: qty 2

## 2018-02-08 MED ORDER — FENTANYL CITRATE (PF) 100 MCG/2ML IJ SOLN
50.0000 ug | Freq: Once | INTRAMUSCULAR | Status: AC
  Administered 2018-02-08: 50 ug via INTRAVENOUS
  Filled 2018-02-08: qty 2

## 2018-02-08 MED ORDER — ONDANSETRON HCL 4 MG PO TABS: 4.0000 mg | ORAL_TABLET | Freq: Every day | ORAL | 0 refills | Status: AC | PRN

## 2018-02-08 MED ORDER — LIDOCAINE HCL (PF) 1 % IJ SOLN
INTRAMUSCULAR | Status: AC
  Filled 2018-02-08: qty 10

## 2018-02-08 MED ORDER — LIDOCAINE HCL (PF) 1 % IJ SOLN
10.0000 mL | Freq: Once | INTRAMUSCULAR | Status: AC
  Administered 2018-02-08: 10 mL
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
## 2018-02-08 NOTE — Discharge Instructions (Signed)
Please keep your splint on at all times.  You may take Tylenol for mild to moderate pain and Percocet for severe pain.  Please go to your scheduled appoint with Dr. Rudene Christians at 9 AM tomorrow.  Do not eat or drink after midnight tonight, as he is planning to take you to the operating room in the afternoon for repair of your wrist.  Return to the emergency department if you develop severe pain, lightheadedness or fainting, nausea or vomiting, tingling, skin changes, or any other symptoms concerning to you.

## 2018-02-08 NOTE — ED Notes (Signed)
Report called to Janett Billow Rn  Pt moved to room 3
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
## 2018-02-09 ENCOUNTER — Ambulatory Visit: Payer: Medicare Other | Admitting: Anesthesiology

## 2018-02-09 ENCOUNTER — Encounter: Payer: Self-pay | Admitting: *Deleted

## 2018-02-09 ENCOUNTER — Encounter
Admission: RE | Disposition: A | Discharge status: Home / Self Care | Payer: Self-pay | Source: Ambulatory Visit | Attending: Orthopedic Surgery

## 2018-02-09 ENCOUNTER — Ambulatory Visit
Admission: RE | Admit: 2018-02-09 | Discharge: 2018-02-09 | Disposition: A | Discharge status: Home / Self Care | Payer: Medicare Other | Source: Ambulatory Visit | Attending: Orthopedic Surgery | Admitting: Orthopedic Surgery

## 2018-02-09 ENCOUNTER — Other Ambulatory Visit: Payer: Self-pay

## 2018-02-09 ENCOUNTER — Ambulatory Visit: Payer: Medicare Other

## 2018-02-09 DIAGNOSIS — S52571A Other intraarticular fracture of lower end of right radius, initial encounter for closed fracture: Secondary | ICD-10-CM | POA: Diagnosis present

## 2018-02-09 DIAGNOSIS — X58XXXA Exposure to other specified factors, initial encounter: Secondary | ICD-10-CM | POA: Insufficient documentation

## 2018-02-09 DIAGNOSIS — S52571B Other intraarticular fracture of lower end of right radius, initial encounter for open fracture type I or II: Secondary | ICD-10-CM | POA: Diagnosis not present

## 2018-02-09 DIAGNOSIS — Z9889 Other specified postprocedural states: Secondary | ICD-10-CM

## 2018-02-09 DIAGNOSIS — Z8781 Personal history of (healed) traumatic fracture: Secondary | ICD-10-CM

## 2018-02-09 HISTORY — PX: OPEN REDUCTION INTERNAL FIXATION (ORIF) DISTAL RADIAL FRACTURE: SHX5989

## 2018-02-09 SURGERY — OPEN REDUCTION INTERNAL FIXATION (ORIF) DISTAL RADIUS FRACTURE
Anesthesia: General | Site: Wrist | Laterality: Right

## 2018-02-09 MED ORDER — SUCCINYLCHOLINE CHLORIDE 20 MG/ML IJ SOLN
INTRAMUSCULAR | Status: DC | PRN
  Administered 2018-02-09: 100 mg via INTRAVENOUS

## 2018-02-09 MED ORDER — METOCLOPRAMIDE HCL 5 MG/ML IJ SOLN: 5.0000 mg | Freq: Three times a day (TID) | INTRAMUSCULAR | Status: DC | PRN

## 2018-02-09 MED ORDER — ACETAMINOPHEN 10 MG/ML IV SOLN
INTRAVENOUS | Status: AC
  Filled 2018-02-09: qty 100

## 2018-02-09 MED ORDER — HYDROCODONE-ACETAMINOPHEN 5-325 MG PO TABS: 1.0000 | ORAL_TABLET | ORAL | Status: DC | PRN

## 2018-02-09 MED ORDER — NEOMYCIN-POLYMYXIN B GU 40-200000 IR SOLN
Status: AC
  Filled 2018-02-09: qty 20

## 2018-02-09 MED ORDER — CLINDAMYCIN PHOSPHATE 900 MG/50ML IV SOLN
INTRAVENOUS | Status: AC
  Filled 2018-02-09: qty 50

## 2018-02-09 MED ORDER — MIDAZOLAM HCL 2 MG/2ML IJ SOLN
INTRAMUSCULAR | Status: AC
  Filled 2018-02-09: qty 2

## 2018-02-09 MED ORDER — ONDANSETRON HCL 4 MG/2ML IJ SOLN
INTRAMUSCULAR | Status: DC | PRN
  Administered 2018-02-09: 4 mg via INTRAVENOUS

## 2018-02-09 MED ORDER — LACTATED RINGERS IV SOLN
INTRAVENOUS | Status: DC
  Administered 2018-02-09 (×2): via INTRAVENOUS

## 2018-02-09 MED ORDER — PROPOFOL 10 MG/ML IV BOLUS
INTRAVENOUS | Status: DC | PRN
  Administered 2018-02-09: 140 mg via INTRAVENOUS

## 2018-02-09 MED ORDER — DEXAMETHASONE SODIUM PHOSPHATE 10 MG/ML IJ SOLN
INTRAMUSCULAR | Status: AC
  Filled 2018-02-09: qty 1

## 2018-02-09 MED ORDER — FENTANYL CITRATE (PF) 100 MCG/2ML IJ SOLN
INTRAMUSCULAR | Status: DC | PRN
  Administered 2018-02-09 (×2): 50 ug via INTRAVENOUS

## 2018-02-09 MED ORDER — ACETAMINOPHEN 10 MG/ML IV SOLN
INTRAVENOUS | Status: DC | PRN
  Administered 2018-02-09: 1000 mg via INTRAVENOUS

## 2018-02-09 MED ORDER — PROPOFOL 10 MG/ML IV BOLUS
INTRAVENOUS | Status: AC
  Filled 2018-02-09: qty 20

## 2018-02-09 MED ORDER — FENTANYL CITRATE (PF) 100 MCG/2ML IJ SOLN
INTRAMUSCULAR | Status: AC
  Administered 2018-02-09: 25 ug via INTRAVENOUS
  Filled 2018-02-09: qty 2

## 2018-02-09 MED ORDER — KETOROLAC TROMETHAMINE 30 MG/ML IJ SOLN: 30.0000 mg | Freq: Once | INTRAMUSCULAR | Status: DC

## 2018-02-09 MED ORDER — FENTANYL CITRATE (PF) 100 MCG/2ML IJ SOLN
25.0000 ug | INTRAMUSCULAR | Status: AC | PRN
  Administered 2018-02-09 (×6): 25 ug via INTRAVENOUS

## 2018-02-09 MED ORDER — EPHEDRINE SULFATE 50 MG/ML IJ SOLN
INTRAMUSCULAR | Status: DC | PRN
  Administered 2018-02-09: 15 mg via INTRAVENOUS

## 2018-02-09 MED ORDER — SODIUM CHLORIDE 0.9 % IV SOLN: INTRAVENOUS | Status: DC

## 2018-02-09 MED ORDER — ONDANSETRON HCL 4 MG/2ML IJ SOLN
INTRAMUSCULAR | Status: AC
  Filled 2018-02-09: qty 2

## 2018-02-09 MED ORDER — MIDAZOLAM HCL 2 MG/2ML IJ SOLN
INTRAMUSCULAR | Status: DC | PRN
  Administered 2018-02-09: 2 mg via INTRAVENOUS

## 2018-02-09 MED ORDER — OXYCODONE HCL 5 MG PO TABS
ORAL_TABLET | ORAL | Status: AC
  Filled 2018-02-09: qty 1

## 2018-02-09 MED ORDER — OXYCODONE HCL 5 MG PO TABS
5.0000 mg | ORAL_TABLET | Freq: Once | ORAL | Status: AC
  Administered 2018-02-09: 5 mg via ORAL

## 2018-02-09 MED ORDER — METOCLOPRAMIDE HCL 10 MG PO TABS: 5.0000 mg | ORAL_TABLET | Freq: Three times a day (TID) | ORAL | Status: DC | PRN

## 2018-02-09 MED ORDER — EPHEDRINE SULFATE 50 MG/ML IJ SOLN
INTRAMUSCULAR | Status: AC
  Filled 2018-02-09: qty 1

## 2018-02-09 MED ORDER — NEOMYCIN-POLYMYXIN B GU 40-200000 IR SOLN
Status: DC | PRN
  Administered 2018-02-09: 2 mL

## 2018-02-09 MED ORDER — ONDANSETRON HCL 4 MG/2ML IJ SOLN: 4.0000 mg | Freq: Four times a day (QID) | INTRAMUSCULAR | Status: DC | PRN

## 2018-02-09 MED ORDER — ONDANSETRON HCL 4 MG PO TABS: 4.0000 mg | ORAL_TABLET | Freq: Four times a day (QID) | ORAL | Status: DC | PRN

## 2018-02-09 MED ORDER — CLINDAMYCIN PHOSPHATE 900 MG/50ML IV SOLN
900.0000 mg | Freq: Once | INTRAVENOUS | Status: AC
  Administered 2018-02-09: 900 mg via INTRAVENOUS

## 2018-02-09 MED ORDER — KETOROLAC TROMETHAMINE 30 MG/ML IJ SOLN
INTRAMUSCULAR | Status: AC
  Administered 2018-02-09: 30 mg
  Filled 2018-02-09: qty 1

## 2018-02-09 MED ORDER — DEXMEDETOMIDINE HCL IN NACL 200 MCG/50ML IV SOLN
INTRAVENOUS | Status: AC
  Filled 2018-02-09: qty 50

## 2018-02-09 MED ORDER — FAMOTIDINE 20 MG PO TABS
ORAL_TABLET | ORAL | Status: AC
  Administered 2018-02-09: 20 mg via ORAL
  Filled 2018-02-09: qty 1

## 2018-02-09 MED ORDER — LIDOCAINE HCL (CARDIAC) PF 100 MG/5ML IV SOSY
PREFILLED_SYRINGE | INTRAVENOUS | Status: DC | PRN
  Administered 2018-02-09: 60 mg via INTRAVENOUS

## 2018-02-09 MED ORDER — FENTANYL CITRATE (PF) 100 MCG/2ML IJ SOLN
INTRAMUSCULAR | Status: AC
  Filled 2018-02-09: qty 2

## 2018-02-09 MED ORDER — SUCCINYLCHOLINE CHLORIDE 20 MG/ML IJ SOLN
INTRAMUSCULAR | Status: AC
  Filled 2018-02-09: qty 1

## 2018-02-09 MED ORDER — FAMOTIDINE 20 MG PO TABS
20.0000 mg | ORAL_TABLET | Freq: Once | ORAL | Status: AC
  Administered 2018-02-09: 20 mg via ORAL

## 2018-02-09 MED ORDER — DEXAMETHASONE SODIUM PHOSPHATE 10 MG/ML IJ SOLN
INTRAMUSCULAR | Status: DC | PRN
  Administered 2018-02-09: 5 mg via INTRAVENOUS

## 2018-02-09 MED ORDER — LIDOCAINE HCL (PF) 2 % IJ SOLN
INTRAMUSCULAR | Status: AC
  Filled 2018-02-09: qty 10

## 2018-02-09 SURGICAL SUPPLY — 42 items
BANDAGE ACE 4X5 VEL STRL LF (GAUZE/BANDAGES/DRESSINGS) ×3 IMPLANT
BIT DRILL 2 FAST STEP (BIT) ×3 IMPLANT
BIT DRILL 2.5X4 QC (BIT) ×3 IMPLANT
CANISTER SUCT 1200ML W/VALVE (MISCELLANEOUS) ×3 IMPLANT
CHLORAPREP W/TINT 26ML (MISCELLANEOUS) ×3 IMPLANT
COVER WAND RF STERILE (DRAPES) ×3 IMPLANT
CUFF TOURN 18 STER (MISCELLANEOUS) IMPLANT
DRAPE FLUOR MINI C-ARM 54X84 (DRAPES) ×3 IMPLANT
ELECT REM PT RETURN 9FT ADLT (ELECTROSURGICAL) ×3
ELECTRODE REM PT RTRN 9FT ADLT (ELECTROSURGICAL) ×1 IMPLANT
GAUZE PETRO XEROFOAM 1X8 (MISCELLANEOUS) ×6 IMPLANT
GAUZE SPONGE 4X4 12PLY STRL (GAUZE/BANDAGES/DRESSINGS) ×3 IMPLANT
GLOVE SURG SYN 9.0  PF PI (GLOVE) ×2
GLOVE SURG SYN 9.0 PF PI (GLOVE) ×1 IMPLANT
GOWN SRG 2XL LVL 4 RGLN SLV (GOWNS) ×1 IMPLANT
GOWN STRL NON-REIN 2XL LVL4 (GOWNS) ×2
GOWN STRL REUS W/ TWL LRG LVL3 (GOWN DISPOSABLE) ×1 IMPLANT
GOWN STRL REUS W/TWL LRG LVL3 (GOWN DISPOSABLE) ×2
K-WIRE 1.6 (WIRE) ×2
K-WIRE FX5X1.6XNS BN SS (WIRE) ×1
KIT TURNOVER KIT A (KITS) ×3 IMPLANT
KWIRE FX5X1.6XNS BN SS (WIRE) ×1 IMPLANT
NEEDLE FILTER BLUNT 18X 1/2SAF (NEEDLE) ×2
NEEDLE FILTER BLUNT 18X1 1/2 (NEEDLE) ×1 IMPLANT
NS IRRIG 500ML POUR BTL (IV SOLUTION) ×3 IMPLANT
PACK EXTREMITY ARMC (MISCELLANEOUS) ×3 IMPLANT
PAD CAST CTTN 4X4 STRL (SOFTGOODS) ×2 IMPLANT
PADDING CAST COTTON 4X4 STRL (SOFTGOODS) ×4
PEG SUBCHONDRAL SMOOTH 2.0X14 (Peg) ×3 IMPLANT
PEG SUBCHONDRAL SMOOTH 2.0X16 (Peg) ×6 IMPLANT
PEG SUBCHONDRAL SMOOTH 2.0X18 (Peg) ×6 IMPLANT
PLATE XLONG 24.4X89.5 RT (Plate) ×3 IMPLANT
PUTTY DBX 1CC (Putty) ×3 IMPLANT
PUTTY DBX 1CC DEPUY (Putty) ×1 IMPLANT
SCALPEL PROTECTED #15 DISP (BLADE) ×6 IMPLANT
SCREW CORT 3.5X10 LNG (Screw) ×12 IMPLANT
SPLINT CAST 1 STEP 3X12 (MISCELLANEOUS) ×3 IMPLANT
SUT ETHILON 4-0 (SUTURE) ×2
SUT ETHILON 4-0 FS2 18XMFL BLK (SUTURE) ×1
SUT VICRYL 3-0 27IN (SUTURE) ×3 IMPLANT
SUTURE ETHLN 4-0 FS2 18XMF BLK (SUTURE) ×1 IMPLANT
SYR 3ML LL SCALE MARK (SYRINGE) ×3 IMPLANT

## 2018-02-09 NOTE — Anesthesia Procedure Notes (Signed)
Procedure Name: Intubation Date/Time: 02/09/2018 2:46 PM Performed by: Jonna Clark, CRNA Pre-anesthesia Checklist: Patient identified, Patient being monitored, Timeout performed, Emergency Drugs available and Suction available Patient Re-evaluated:Patient Re-evaluated prior to induction Oxygen Delivery Method: Circle system utilized Preoxygenation: Pre-oxygenation with 100% oxygen Induction Type: IV induction Ventilation: Mask ventilation without difficulty Laryngoscope Size: Mac and 3 Grade View: Grade I Tube type: Oral Tube size: 7.0 mm Number of attempts: 1 Placement Confirmation: ETT inserted through vocal cords under direct vision,  positive ETCO2 and breath sounds checked- equal and bilateral Secured at: 21 cm Tube secured with: Tape Dental Injury: Teeth and Oropharynx as per pre-operative assessment

## 2018-02-09 NOTE — Anesthesia Postprocedure Evaluation (Signed)
Anesthesia Post Note  Patient: Alison Bradshaw  Procedure(s) Performed: OPEN REDUCTION INTERNAL FIXATION (ORIF) DISTAL RADIAL FRACTURE (Right Wrist)  Patient location during evaluation: PACU Anesthesia Type: General Level of consciousness: awake and alert Pain management: pain level controlled Vital Signs Assessment: post-procedure vital signs reviewed and stable Respiratory status: spontaneous breathing, nonlabored ventilation, respiratory function stable and patient connected to nasal cannula oxygen Cardiovascular status: blood pressure returned to baseline and stable Postop Assessment: no apparent nausea or vomiting Anesthetic complications: no     Last Vitals:  Vitals:   02/09/18 1735 02/09/18 1755  BP: (!) 155/71 (!) 143/67  Pulse: 70 66  Resp: 18 18  Temp: 36.9 C 37.2 C  SpO2: 100% 100%    Last Pain:  Vitals:   02/09/18 1755  TempSrc: Temporal  PainSc: 4                  Martha Clan

## 2018-02-09 NOTE — Discharge Instructions (Addendum)
Take pain medicine and nausea medicine as prescribed and already sent to pharmacy.  Keep arm elevated as much as possible and work on finger range of motion as tolerated.  Ice to the back of the wrist today and tomorrow for additional pain relief.  Keep splint clean and dry    AMBULATORY SURGERY  DISCHARGE INSTRUCTIONS   1) The drugs that you were given will stay in your system until tomorrow so for the next 24 hours you should not:  A) Drive an automobile B) Make any legal decisions C) Drink any alcoholic beverage   2) You may resume regular meals tomorrow.  Today it is better to start with liquids and gradually work up to solid foods.  You may eat anything you prefer, but it is better to start with liquids, then soup and crackers, and gradually work up to solid foods.   3) Please notify your doctor immediately if you have any unusual bleeding, trouble breathing, redness and pain at the surgery site, drainage, fever, or pain not relieved by medication.    4) Additional Instructions:        Please contact your physician with any problems or Same Day Surgery at [phone], Monday through Friday 6 am to 4 pm, or [place] at [facility] number at [phone].

## 2018-02-09 NOTE — Op Note (Signed)
02/09/2018  4:03 PM  PATIENT:  Alison Bradshaw  77 y.o. female  PRE-OPERATIVE DIAGNOSIS:  RIGHT WRIST FRACTURE  POST-OPERATIVE DIAGNOSIS:  RIGHT WRIST FRACTURE  PROCEDURE:  Procedure(s): OPEN REDUCTION INTERNAL FIXATION (ORIF) DISTAL RADIAL FRACTURE (Right) intra-and extra-articular distal radius with 2 distal fragments  SURGEON: Laurene Footman, MD  ASSISTANTS: None  ANESTHESIA:   general  EBL:  No intake/output data recorded.  BLOOD ADMINISTERED:none  DRAINS: none   LOCAL MEDICATIONS USED:  NONE  SPECIMEN:  No Specimen  DISPOSITION OF SPECIMEN:  N/A  COUNTS:  YES  TOURNIQUET: 34 minutes at 250 mmHg  IMPLANTS: [employer] innovations right DVR plate 7 hole with multiple smooth pegs and screws, 1 cc DBX  DICTATION: .Dragon Dictation patient was brought to the operating room and after adequate anesthesia was obtained the right arm was prepped and draped in the usual sterile fashion.  After patient identification and timeout procedures were completed, tourniquet was raised.  Incision was made in line with the FCR tendon and the tendon exposed and tendon sheath incised.  The tendon was retracted radially protect the radial artery and associated veins.  The deep fascia was incised and the muscle had quite a bit of bruising present this was elevated retracted ulnarly and the pronator was exposed.  The pronator was elevated off the radial border and watershed area the shaft had predominantly all the soft tissue stripping required for the plate already done by the fracture.  Fingertrap traction to been applied with 10 pounds of this gave length wound with gentle reduction maneuver near anatomic alignment was obtained.  A 7 hole plate was then applied with a distal first technique because of dorsal angulation of the distal fragment.  After checking to get the plate in the appropriate position and pinning it multiple smooth pegs were placed with 2 screw holes left open because it was a  standard with her of the marrow with for the 7 hole plate.  The plate was then brought down to the shaft and 410 mm cortical screws were placed with one placed more in the most distal hole to help bring the plate down to the shaft.  There was some comminution dorsally that left a gap and this was filled with 1 cc of DBX.  The traction was removed and the wrist was stable through range of motion.  At this point the wound was irrigated and tourniquet let down.  There is no significant bleeding.  The wound is closed with 3-0 Vicryl subcutaneously followed by 4-0 nylon in a simple interrupted manner.  Xeroform 4 x 4's web roll a volar splint and Ace wrap applied.  PLAN OF CARE: Discharge to home after PACU  PATIENT DISPOSITION:  PACU - hemodynamically stable.

## 2018-02-09 NOTE — Progress Notes (Signed)
Paged Dr. Rudene Christians for patient's continued pain. Per verbal from Dr. Rudene Christians, give toradol 30mg  IV once. Order entered.

## 2018-02-09 NOTE — Progress Notes (Signed)
Informed Dr. Rosey Bath of patient's pain level, requested po pain medication. Dr. Rosey Bath states to give po medication that Dr. Rudene Christians ordered for home if there is no Tylenol in the med. The med was norco with Tylenol, Dr. Rosey Bath attempted to order hydrocodone with ibuprofen, pt states she cannot take ibuprofen due to stomach issues. Plain oxycodone given.

## 2018-02-09 NOTE — Anesthesia Post-op Follow-up Note (Signed)
Anesthesia QCDR form completed.        

## 2018-02-09 NOTE — Transfer of Care (Signed)
Immediate Anesthesia Transfer of Care Note  Patient: Alison Bradshaw  Procedure(s) Performed: OPEN REDUCTION INTERNAL FIXATION (ORIF) DISTAL RADIAL FRACTURE (Right Wrist)  Patient Location: PACU  Anesthesia Type:General  Level of Consciousness: drowsy and patient cooperative  Airway & Oxygen Therapy: Patient Spontanous Breathing and Patient connected to face mask oxygen  Post-op Assessment: Report given to RN and Post -op Vital signs reviewed and stable  Post vital signs: Reviewed and stable  Last Vitals:  Vitals Value Taken Time  BP 139/74 02/09/2018  3:59 PM  Temp 36.6 C 02/09/2018  3:59 PM  Pulse 83 02/09/2018  4:06 PM  Resp 15 02/09/2018  4:06 PM  SpO2 100 % 02/09/2018  4:06 PM  Vitals shown include unvalidated device data.  Last Pain:  Vitals:   02/09/18 1004  TempSrc: Tympanic  PainSc: 4          Complications: No apparent anesthesia complications
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
## 2018-02-10 ENCOUNTER — Encounter: Payer: Self-pay | Admitting: Orthopedic Surgery

## 2018-02-16 ENCOUNTER — Encounter: Payer: Self-pay | Admitting: Orthopedic Surgery

## 2018-03-09 ENCOUNTER — Encounter: Payer: Self-pay | Admitting: Occupational Therapy

## 2018-03-09 ENCOUNTER — Ambulatory Visit: Payer: Medicare Other | Attending: Orthopedic Surgery | Admitting: Occupational Therapy

## 2018-03-09 DIAGNOSIS — M25531 Pain in right wrist: Secondary | ICD-10-CM | POA: Diagnosis present

## 2018-03-09 DIAGNOSIS — L905 Scar conditions and fibrosis of skin: Secondary | ICD-10-CM | POA: Diagnosis present

## 2018-03-09 DIAGNOSIS — M6281 Muscle weakness (generalized): Secondary | ICD-10-CM

## 2018-03-09 DIAGNOSIS — R208 Other disturbances of skin sensation: Secondary | ICD-10-CM | POA: Diagnosis present

## 2018-03-09 DIAGNOSIS — M25631 Stiffness of right wrist, not elsewhere classified: Secondary | ICD-10-CM

## 2018-03-09 NOTE — Patient Instructions (Signed)
Contrast  Scar massage and desensitization of scar cica scar pad for night time   Tendon glides 10 reps  Opposition to all digits and slide down 5th  5 reps  AROM for wrist flexion , ext, RD, UD ,sup , pronation  10 reps  2-3 x day Slight pull or stretch - pain not more than 1-2/10
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
## 2018-03-13 ENCOUNTER — Ambulatory Visit: Payer: Medicare Other | Admitting: Occupational Therapy

## 2018-03-13 DIAGNOSIS — M6281 Muscle weakness (generalized): Secondary | ICD-10-CM

## 2018-03-13 DIAGNOSIS — M25631 Stiffness of right wrist, not elsewhere classified: Secondary | ICD-10-CM | POA: Diagnosis not present

## 2018-03-13 DIAGNOSIS — M25531 Pain in right wrist: Secondary | ICD-10-CM

## 2018-03-13 DIAGNOSIS — L905 Scar conditions and fibrosis of skin: Secondary | ICD-10-CM

## 2018-03-13 DIAGNOSIS — R208 Other disturbances of skin sensation: Secondary | ICD-10-CM

## 2018-03-13 NOTE — Therapy (Signed)
[place] [facility] [address], [place], [address] Phone: [phone]   Fax:  [phone]  Occupational Therapy Treatment  Patient Details  Name: Alison Bradshaw MRN: [id number] Date of Birth: 10/23/1940 Referring Provider (OT): Rudene Christians   Encounter Date: 03/13/2018  OT End of Session - 03/13/18 1821    Visit Number  2    Number of Visits  12    Date for OT Re-Evaluation  05/04/18    OT Start Time  1206    OT Stop Time  1249    OT Time Calculation (min)  43 min    Activity Tolerance  Patient tolerated treatment well    Behavior During Therapy  [facility] for tasks assessed/performed       Past Medical History:  Diagnosis Date  . Anxiety   . Asthma   . Chronic gastritis   . Duodenal ulcer   . GERD (gastroesophageal reflux disease)   . Osteopenia   . Rosacea   . Senile nuclear sclerosis   . 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 BREAST BIOPSY Left 2008   neg  . BREAST EXCISIONAL BIOPSY Left 2004   neg  . BREAST SURGERY    . CATARACT EXTRACTION W/ INTRAOCULAR LENS  IMPLANT, BILATERAL Bilateral   . COLONOSCOPY WITH PROPOFOL N/A 01/22/2017   Procedure: COLONOSCOPY WITH PROPOFOL;  Surgeon: Toledo, Benay Pike, MD;  Location: [facility] ENDOSCOPY;  Service: Gastroenterology;  Laterality: N/A;  . ESOPHAGOGASTRODUODENOSCOPY (EGD) WITH PROPOFOL N/A 01/20/2015   Procedure: ESOPHAGOGASTRODUODENOSCOPY (EGD) WITH PROPOFOL;  Surgeon: Lollie Sails, MD;  Location: [facility] ENDOSCOPY;  Service: Endoscopy;  Laterality: N/A;  . ESOPHAGOGASTRODUODENOSCOPY (EGD) WITH PROPOFOL N/A 01/22/2017   Procedure: ESOPHAGOGASTRODUODENOSCOPY (EGD) WITH PROPOFOL;  Surgeon: Toledo, Benay Pike, MD;  Location: [facility] ENDOSCOPY;  Service: Gastroenterology;  Laterality: N/A;  . EYE SURGERY    . OPEN REDUCTION INTERNAL FIXATION (ORIF) DISTAL RADIAL FRACTURE Right 02/09/2018   Procedure: OPEN REDUCTION  INTERNAL FIXATION (ORIF) DISTAL RADIAL FRACTURE;  Surgeon: Hessie Knows, MD;  Location: [facility] ORS;  Service: Orthopedics;  Laterality: Right;  . TONSILLECTOMY      There were no vitals filed for this visit.  Subjective Assessment - 03/13/18 1819    Subjective   I did the exercises - and played little bit of piano - doing okay - did the massage and the range of motion     Patient Stated Goals  I want to be able to use my R hand and wrist like before and play piano     Currently in Pain?  No/denies         [facility] OT Assessment - 03/13/18 0001      AROM   Right Forearm Pronation  70 Degrees   85 open hand    Right Forearm Supination  70 Degrees    Right Wrist Extension  52 Degrees    Right Wrist Flexion  50 Degrees    Right Wrist Radial Deviation  26 Degrees    Right Wrist Ulnar Deviation  20 Degrees        assess AROM for R wrist in all planes  fisting WNL  And opposition to base of 5th with not pain  Sup and pronation increase with hand open - with hand in fist - pull on ulnar side of wrist and hand        OT Treatments/Exercises (OP) - 03/13/18 0001  RUE Fluidotherapy   Number Minutes Fluidotherapy  8 Minutes    RUE Fluidotherapy Location  Hand;Wrist    Comments  AROM for wrist and diigits in all planes at [facility] to increase ROM         Scar massage and desensitization of scar done by OT and pt to cont with at home - reed again  cica scar pad for night time   Tendon glides 10 reps  Opposition to all digits and slide down 5th  No issues this date  5 reps  AROM for wrist flexion , ext, RD, UD ,sup , pronation  10 reps  this OT done some AAROM for wrist flexion and extention  / and sup /pro but no issues   2-3 x day Slight pull or stretch - pain not more than 1-2/10       OT Education - 03/13/18 1821    Education Details  neoprene BEnik splint use and HEP     Person(s) Educated  Patient    Methods  Explanation;Demonstration;Handout    Comprehension   Returned demonstration       OT Short Term Goals - 03/09/18 1643      OT SHORT TERM GOAL #1   Title  Pt to be ind in HEP to decrease scar tissue, desentitization to different textures and Increase AROM in wrist in all planes to use hand in more than 50% of ADL;s    Baseline  no knowledge of HEP -sterristrips removed this am - senstitive over scar and     Time  2    Period  Weeks    Status  New    Target Date  03/23/18      OT SHORT TERM GOAL #2   Title  R wrist AROM improve to [facility] to turn doorknob, use in bathroom hyiene and play piano    Baseline  Wrist AROM decrease in all planes - see flowsheet     Time  4    Period  Weeks    Status  New    Target Date  04/06/18      OT SHORT TERM GOAL #3   Title  Pt pain improve on PRWHE by more than 10 points     Baseline  at eval PRWHE score 17/50    Time  3    Period  Weeks    Status  New    Target Date  03/30/18        OT Long Term Goals - 03/09/18 1647      OT LONG TERM GOAL #1   Title  R wrist strength improve to 4+/5 for pt to lift and carry more than 5 lbs , push up from chair with out symptoms     Baseline  4 wks s/p - no strenghening yet     Time  6    Period  Weeks    Status  New    Target Date  04/20/18      OT LONG TERM GOAL #2   Title  R grip and prehension strength to be more than 50% compare to L grip to carry and lift object more than 5 lbs, push and pull door open , lift pots and pans     Baseline  no strength yet - 4 wks s/p    Time  8    Period  Weeks    Status  New    Target Date  05/04/18  OT LONG TERM GOAL #3   Title  Function score on PRWHE improve more than 20 points     Baseline  at eval function score on PRWHE 37.5/50    Time  8    Period  Weeks    Status  New    Target Date  05/04/18            Plan - 03/13/18 1822    Clinical Impression Statement  Pt present 4 1/2 wks s/p ORIF distal radius fx - pt still feeling funny feeling and numbness over thenar and hypothenar eminence - but  maybe area is getting smaller - pt showed increase AROM at wrist and scar improving - pt to cont with AROM for wrist and scar massage - pt wil be in week close to 6 wks s/p and can add PROM     Occupational performance deficits (Please refer to evaluation for details):  ADL's;IADL's;Play;Leisure    Rehab Potential  Good    Current Impairments/barriers affecting progress:   Pt with some sensation changes    OT Frequency  --   1-2 x wk    OT Duration  8 weeks    OT Treatment/Interventions  Self-care/ADL training;Patient/family education;Splinting;Paraffin;Fluidtherapy;Scar mobilization;Manual Therapy;Passive range of motion;Contrast Bath;Therapeutic exercise    Plan  assess progress with HEP -and sensation changes and add PROM     Clinical Decision Making  Multiple treatment options, significant modification of task necessary    OT Home Exercise Plan  see pt instruction    Consulted and Agree with Plan of Care  Patient       Patient will benefit from skilled therapeutic intervention in order to improve the following deficits and impairments:  Impaired flexibility, Pain, Impaired sensation, Decreased scar mobility, Decreased range of motion, Decreased strength, Impaired UE functional use  Visit Diagnosis: Stiffness of right wrist, not elsewhere classified  Pain in right wrist  Muscle weakness (generalized)  Scar condition and fibrosis of skin  Other disturbances of skin sensation    Problem List There are no active problems to display for this patient.   Rosalyn Gess OTR/L,CLT  03/13/2018, 6:26 PM  [place] [facility] [address], [place], [address] Phone: [phone]   Fax:  [phone]  Name: Aldean Suddeth MRN: [id number] Date of Birth: 11-Jul-1940

## 2018-03-13 NOTE — Patient Instructions (Signed)
Same HEP  Benik neoprene splint to use for short periods of time playing piano and writing

## 2018-03-16 ENCOUNTER — Ambulatory Visit: Payer: Medicare Other | Admitting: Occupational Therapy

## 2018-03-16 DIAGNOSIS — L905 Scar conditions and fibrosis of skin: Secondary | ICD-10-CM

## 2018-03-16 DIAGNOSIS — R208 Other disturbances of skin sensation: Secondary | ICD-10-CM

## 2018-03-16 DIAGNOSIS — M25531 Pain in right wrist: Secondary | ICD-10-CM

## 2018-03-16 DIAGNOSIS — M25631 Stiffness of right wrist, not elsewhere classified: Secondary | ICD-10-CM

## 2018-03-16 DIAGNOSIS — M6281 Muscle weakness (generalized): Secondary | ICD-10-CM

## 2018-03-16 NOTE — Therapy (Signed)
[place] [facility] [address], [place], [address] Phone: [phone]   Fax:  [phone]  Occupational Therapy Treatment  Patient Details  Name: Alison Bradshaw MRN: [id number] Date of Birth: May 19, 1940 Referring Provider (OT): Rudene Christians   Encounter Date: 03/16/2018  OT End of Session - 03/16/18 1014    Visit Number  3    Number of Visits  12    Date for OT Re-Evaluation  05/04/18    OT Start Time  0954    OT Stop Time  1031    OT Time Calculation (min)  37 min    Activity Tolerance  Patient tolerated treatment well    Behavior During Therapy  [facility] for tasks assessed/performed       Past Medical History:  Diagnosis Date  . Anxiety   . Asthma   . Chronic gastritis   . Duodenal ulcer   . GERD (gastroesophageal reflux disease)   . Osteopenia   . Rosacea   . Senile nuclear sclerosis   . 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 BREAST BIOPSY Left 2008   neg  . BREAST EXCISIONAL BIOPSY Left 2004   neg  . BREAST SURGERY    . CATARACT EXTRACTION W/ INTRAOCULAR LENS  IMPLANT, BILATERAL Bilateral   . COLONOSCOPY WITH PROPOFOL N/A 01/22/2017   Procedure: COLONOSCOPY WITH PROPOFOL;  Surgeon: Toledo, Benay Pike, MD;  Location: [facility] ENDOSCOPY;  Service: Gastroenterology;  Laterality: N/A;  . ESOPHAGOGASTRODUODENOSCOPY (EGD) WITH PROPOFOL N/A 01/20/2015   Procedure: ESOPHAGOGASTRODUODENOSCOPY (EGD) WITH PROPOFOL;  Surgeon: Lollie Sails, MD;  Location: [facility] ENDOSCOPY;  Service: Endoscopy;  Laterality: N/A;  . ESOPHAGOGASTRODUODENOSCOPY (EGD) WITH PROPOFOL N/A 01/22/2017   Procedure: ESOPHAGOGASTRODUODENOSCOPY (EGD) WITH PROPOFOL;  Surgeon: Toledo, Benay Pike, MD;  Location: [facility] ENDOSCOPY;  Service: Gastroenterology;  Laterality: N/A;  . EYE SURGERY    . OPEN REDUCTION INTERNAL FIXATION (ORIF) DISTAL RADIAL FRACTURE Right 02/09/2018   Procedure: OPEN REDUCTION  INTERNAL FIXATION (ORIF) DISTAL RADIAL FRACTURE;  Surgeon: Hessie Knows, MD;  Location: [facility] ORS;  Service: Orthopedics;  Laterality: Right;  . TONSILLECTOMY      There were no vitals filed for this visit.  Subjective Assessment - 03/16/18 1007    Subjective   Did do the exercises -and have some pain at the base of my thumb at times- my husband fell while I was Friday here - had to go to the ED - and he has a humerus fx or chip     Patient Stated Goals  I want to be able to use my R hand and wrist like before and play piano     Currently in Pain?  No/denies         [facility] OT Assessment - 03/16/18 0001      AROM   Right Forearm Supination  75 Degrees    Right Wrist Extension  55 Degrees    Right Wrist Flexion  55 Degrees       Pt AROM at wrist still improving - using wrist splint most all the time         OT Treatments/Exercises (OP) - 03/16/18 0001      RUE Fluidotherapy   Number Minutes Fluidotherapy  8 Minutes    RUE Fluidotherapy Location  Hand;Wrist    Comments  AROM for wrist and digits  at [facility]       Soft tissue mobs done to webspace ,  CP and MC spreads And gentle graston tool nr 2 done on volar and dorsal forearm - and soft tissue massage over radial side of wrist and UD with thumb in fist - gentle stretch  Scar massage done - pt to cont with   Pt ed on not overdoing using lateral grip with pick up or lifting object - but palm or forearm  Can do ice massage over 1st dorsal compartment   Done some AAROM for wrist in all planes - great progress and less pain for pronation- still limited in sup with fist - show increase pronation and supination open hand   Cont with same HEP - will add or plan to do PROM          OT Education - 03/16/18 1138    Education Details  joint protection and not using thumb - but use palm     Person(s) Educated  Patient    Methods  Explanation;Demonstration;Handout    Comprehension  Returned demonstration;Verbalized  understanding       OT Short Term Goals - 03/09/18 1643      OT SHORT TERM GOAL #1   Title  Pt to be ind in HEP to decrease scar tissue, desentitization to different textures and Increase AROM in wrist in all planes to use hand in more than 50% of ADL;s    Baseline  no knowledge of HEP -sterristrips removed this am - senstitive over scar and     Time  2    Period  Weeks    Status  New    Target Date  03/23/18      OT SHORT TERM GOAL #2   Title  R wrist AROM improve to [facility] to turn doorknob, use in bathroom hyiene and play piano    Baseline  Wrist AROM decrease in all planes - see flowsheet     Time  4    Period  Weeks    Status  New    Target Date  04/06/18      OT SHORT TERM GOAL #3   Title  Pt pain improve on PRWHE by more than 10 points     Baseline  at eval PRWHE score 17/50    Time  3    Period  Weeks    Status  New    Target Date  03/30/18        OT Long Term Goals - 03/09/18 1647      OT LONG TERM GOAL #1   Title  R wrist strength improve to 4+/5 for pt to lift and carry more than 5 lbs , push up from chair with out symptoms     Baseline  4 wks s/p - no strenghening yet     Time  6    Period  Weeks    Status  New    Target Date  04/20/18      OT LONG TERM GOAL #2   Title  R grip and prehension strength to be more than 50% compare to L grip to carry and lift object more than 5 lbs, push and pull door open , lift pots and pans     Baseline  no strength yet - 4 wks s/p    Time  8    Period  Weeks    Status  New    Target Date  05/04/18      OT LONG TERM GOAL #3   Title  Function score on PRWHE improve  more than 20 points     Baseline  at eval function score on PRWHE 37.5/50    Time  8    Period  Weeks    Status  New    Target Date  05/04/18            Plan - 03/16/18 1139    Clinical Impression Statement  Pt is 5 wks s/p from ORIF distal radius fx - pt cont to show increase ROM in R wrist , and numbness improving - pt report some pain on radial  side of wrist - tender over 1 st dorsal compartment - but [place] negative - pt ed on modifications when carrying or holding objects using palm or forearm     Occupational performance deficits (Please refer to evaluation for details):  ADL's;IADL's;Play;Leisure    Rehab Potential  Good    Current Impairments/barriers affecting progress:   Pt with some sensation changes    OT Frequency  --   1-2 x wk    OT Duration  6 weeks    OT Treatment/Interventions  Self-care/ADL training;Patient/family education;Splinting;Paraffin;Fluidtherapy;Scar mobilization;Manual Therapy;Passive range of motion;Contrast Bath;Therapeutic exercise    Plan  assess progress with HEP -and sensation changes and add PROM next session     Clinical Decision Making  Multiple treatment options, significant modification of task necessary    OT Home Exercise Plan  see pt instruction    Consulted and Agree with Plan of Care  Patient       Patient will benefit from skilled therapeutic intervention in order to improve the following deficits and impairments:  Impaired flexibility, Pain, Impaired sensation, Decreased scar mobility, Decreased range of motion, Decreased strength, Impaired UE functional use  Visit Diagnosis: Stiffness of right wrist, not elsewhere classified  Pain in right wrist  Muscle weakness (generalized)  Scar condition and fibrosis of skin  Other disturbances of skin sensation    Problem List There are no active problems to display for this patient.   Rosalyn Gess OTR/L,CLT 03/16/2018, 11:43 AM  [place] [facility] [address] [place], [address] Phone: [phone]   Fax:  [phone]  Name: Alison Bradshaw MRN: [id number] Date of Birth: 1940/06/18

## 2018-03-16 NOTE — Patient Instructions (Signed)
Same HEP   but pt ed on not using her L thumb in lat grip - repetitively - and increasing pain over 1st dorsal compartment -  To use her palm and forearm to lift , hold objects

## 2018-03-18 ENCOUNTER — Ambulatory Visit: Payer: Medicare Other | Admitting: Occupational Therapy

## 2018-03-18 DIAGNOSIS — M25531 Pain in right wrist: Secondary | ICD-10-CM

## 2018-03-18 DIAGNOSIS — R208 Other disturbances of skin sensation: Secondary | ICD-10-CM

## 2018-03-18 DIAGNOSIS — M25631 Stiffness of right wrist, not elsewhere classified: Secondary | ICD-10-CM

## 2018-03-18 DIAGNOSIS — L905 Scar conditions and fibrosis of skin: Secondary | ICD-10-CM

## 2018-03-18 DIAGNOSIS — M6281 Muscle weakness (generalized): Secondary | ICD-10-CM

## 2018-03-18 NOTE — Therapy (Signed)
[facility] [address], [place], [address] Phone: [phone]   Fax:  [phone]  Occupational Therapy Treatment  Patient Details  Name: Evolett Bradshaw MRN: [id number] Date of Birth: 1941-02-28 Referring Provider (OT): Rudene Christians   Encounter Date: 03/18/2018  OT End of Session - 03/18/18 1549    Visit Number  4    Number of Visits  12    Date for OT Re-Evaluation  05/04/18    OT Start Time  1418    OT Stop Time  1446    OT Time Calculation (min)  28 min    Activity Tolerance  Patient tolerated treatment well    Behavior During Therapy  [facility] for tasks assessed/performed       Past Medical History:  Diagnosis Date  . Anxiety   . Asthma   . Chronic gastritis   . Duodenal ulcer   . GERD (gastroesophageal reflux disease)   . Osteopenia   . Rosacea   . Senile nuclear sclerosis   . 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 BREAST BIOPSY Left 2008   neg  . BREAST EXCISIONAL BIOPSY Left 2004   neg  . BREAST SURGERY    . CATARACT EXTRACTION W/ INTRAOCULAR LENS  IMPLANT, BILATERAL Bilateral   . COLONOSCOPY WITH PROPOFOL N/A 01/22/2017   Procedure: COLONOSCOPY WITH PROPOFOL;  Surgeon: Toledo, Benay Pike, MD;  Location: [facility] ENDOSCOPY;  Service: Gastroenterology;  Laterality: N/A;  . ESOPHAGOGASTRODUODENOSCOPY (EGD) WITH PROPOFOL N/A 01/20/2015   Procedure: ESOPHAGOGASTRODUODENOSCOPY (EGD) WITH PROPOFOL;  Surgeon: Lollie Sails, MD;  Location: [facility] ENDOSCOPY;  Service: Endoscopy;  Laterality: N/A;  . ESOPHAGOGASTRODUODENOSCOPY (EGD) WITH PROPOFOL N/A 01/22/2017   Procedure: ESOPHAGOGASTRODUODENOSCOPY (EGD) WITH PROPOFOL;  Surgeon: Toledo, Benay Pike, MD;  Location: [facility] ENDOSCOPY;  Service: Gastroenterology;  Laterality: N/A;  . EYE SURGERY    . OPEN REDUCTION INTERNAL FIXATION (ORIF) DISTAL RADIAL FRACTURE Right 02/09/2018   Procedure: OPEN REDUCTION  INTERNAL FIXATION (ORIF) DISTAL RADIAL FRACTURE;  Surgeon: Hessie Knows, MD;  Location: [facility] ORS;  Service: Orthopedics;  Laterality: Right;  . TONSILLECTOMY      There were no vitals filed for this visit.  Subjective Assessment - 03/18/18 1425    Subjective   I seen the Dr this am - xray showed good healing -and I need to still keep my splint on - my dog ran away just before I come - always something going on     Patient Stated Goals  I want to be able to use my R hand and wrist like before and play piano     Currently in Pain?  No/denies             Pt late because of her dog got out  Did see MD this am -xray showing good healing       OT Treatments/Exercises (OP) - 03/18/18 0001      RUE Fluidotherapy   Number Minutes Fluidotherapy  8 Minutes    RUE Fluidotherapy Location  Hand;Wrist    Comments  AROM for wrist in all planes       AAROM /PROM done over edge of table   10 reps  hold 5 sec  For wrist flexion, ext, RD, UD  Cont with AROM for wrist afterwards And cont with splint        OT Education - 03/18/18 1548    Education Details  joint protection  and not using thumb - but use palm - and add PROM for wrist     Person(s) Educated  Patient    Methods  Explanation;Demonstration;Handout    Comprehension  Returned demonstration;Verbalized understanding       OT Short Term Goals - 03/09/18 1643      OT SHORT TERM GOAL #1   Title  Pt to be ind in HEP to decrease scar tissue, desentitization to different textures and Increase AROM in wrist in all planes to use hand in more than 50% of ADL;s    Baseline  no knowledge of HEP -sterristrips removed this am - senstitive over scar and     Time  2    Period  Weeks    Status  New    Target Date  03/23/18      OT SHORT TERM GOAL #2   Title  R wrist AROM improve to [facility] to turn doorknob, use in bathroom hyiene and play piano    Baseline  Wrist AROM decrease in all planes - see flowsheet     Time  4    Period   Weeks    Status  New    Target Date  04/06/18      OT SHORT TERM GOAL #3   Title  Pt pain improve on PRWHE by more than 10 points     Baseline  at eval PRWHE score 17/50    Time  3    Period  Weeks    Status  New    Target Date  03/30/18        OT Long Term Goals - 03/09/18 1647      OT LONG TERM GOAL #1   Title  R wrist strength improve to 4+/5 for pt to lift and carry more than 5 lbs , push up from chair with out symptoms     Baseline  4 wks s/p - no strenghening yet     Time  6    Period  Weeks    Status  New    Target Date  04/20/18      OT LONG TERM GOAL #2   Title  R grip and prehension strength to be more than 50% compare to L grip to carry and lift object more than 5 lbs, push and pull door open , lift pots and pans     Baseline  no strength yet - 4 wks s/p    Time  8    Period  Weeks    Status  New    Target Date  05/04/18      OT LONG TERM GOAL #3   Title  Function score on PRWHE improve more than 20 points     Baseline  at eval function score on PRWHE 37.5/50    Time  8    Period  Weeks    Status  New    Target Date  05/04/18            Plan - 03/18/18 1549    Clinical Impression Statement  Pt is about 5 1/2 wks s/pr - making progress in AROM for R wrist in all planes- had check up with MD this am -and upgrade HEP this afternoon -add AAROM /PROM for wrist flexion ,ext, RD, UD - prior to AROM - tolerating well     Occupational performance deficits (Please refer to evaluation for details):  ADL's;IADL's;Play;Leisure    Rehab Potential  Good    Current  Impairments/barriers affecting progress:   Pt with some sensation changes    OT Frequency  2x / week    OT Duration  4 weeks    OT Treatment/Interventions  Self-care/ADL training;Patient/family education;Splinting;Paraffin;Fluidtherapy;Scar mobilization;Manual Therapy;Passive range of motion;Contrast Bath;Therapeutic exercise    Clinical Decision Making  Multiple treatment options, significant  modification of task necessary    OT Home Exercise Plan  see pt instruction    Consulted and Agree with Plan of Care  Patient       Patient will benefit from skilled therapeutic intervention in order to improve the following deficits and impairments:  Impaired flexibility, Pain, Impaired sensation, Decreased scar mobility, Decreased range of motion, Decreased strength, Impaired UE functional use  Visit Diagnosis: Stiffness of right wrist, not elsewhere classified  Pain in right wrist  Muscle weakness (generalized)  Scar condition and fibrosis of skin  Other disturbances of skin sensation    Problem List There are no active problems to display for this patient.   Alison Bradshaw  OTR/L,CLT 03/18/2018, 3:51 PM  [facility] [address] [place], [address] Phone: [phone]   Fax:  [phone]  Name: Alison Bradshaw MRN: [id number] Date of Birth: 02-10-1941

## 2018-03-18 NOTE — Patient Instructions (Signed)
Add to HEP AAROM /PROM over edge of table prior to AROM for wrist  2-3 x day - 10 reps  And prayer stretch and wrist flexion stretch over armrest  xtra during day  10 reps  each

## 2018-03-24 ENCOUNTER — Ambulatory Visit: Payer: Medicare Other | Admitting: Occupational Therapy

## 2018-03-24 DIAGNOSIS — M25631 Stiffness of right wrist, not elsewhere classified: Secondary | ICD-10-CM

## 2018-03-24 DIAGNOSIS — R208 Other disturbances of skin sensation: Secondary | ICD-10-CM

## 2018-03-24 DIAGNOSIS — M25531 Pain in right wrist: Secondary | ICD-10-CM

## 2018-03-24 DIAGNOSIS — M6281 Muscle weakness (generalized): Secondary | ICD-10-CM

## 2018-03-24 DIAGNOSIS — L905 Scar conditions and fibrosis of skin: Secondary | ICD-10-CM

## 2018-03-24 NOTE — Therapy (Signed)
[facility] [address], [place], [address] Phone: [phone]   Fax:  [phone]  Occupational Therapy Treatment  Patient Details  Name: Alison Bradshaw MRN: [id number] Date of Birth: 25-Dec-1940 Referring Provider (OT): Rudene Christians   Encounter Date: 03/24/2018  OT End of Session - 03/24/18 1053    Visit Number  5    Number of Visits  12    Date for OT Re-Evaluation  05/04/18    OT Start Time  0932    OT Stop Time  1033    OT Time Calculation (min)  61 min    Activity Tolerance  Patient tolerated treatment well    Behavior During Therapy  [facility] for tasks assessed/performed       Past Medical History:  Diagnosis Date  . Anxiety   . Asthma   . Chronic gastritis   . Duodenal ulcer   . GERD (gastroesophageal reflux disease)   . Osteopenia   . Rosacea   . Senile nuclear sclerosis   . 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 BREAST BIOPSY Left 2008   neg  . BREAST EXCISIONAL BIOPSY Left 2004   neg  . BREAST SURGERY    . CATARACT EXTRACTION W/ INTRAOCULAR LENS  IMPLANT, BILATERAL Bilateral   . COLONOSCOPY WITH PROPOFOL N/A 01/22/2017   Procedure: COLONOSCOPY WITH PROPOFOL;  Surgeon: Toledo, Benay Pike, MD;  Location: [facility] ENDOSCOPY;  Service: Gastroenterology;  Laterality: N/A;  . ESOPHAGOGASTRODUODENOSCOPY (EGD) WITH PROPOFOL N/A 01/20/2015   Procedure: ESOPHAGOGASTRODUODENOSCOPY (EGD) WITH PROPOFOL;  Surgeon: Lollie Sails, MD;  Location: [facility] ENDOSCOPY;  Service: Endoscopy;  Laterality: N/A;  . ESOPHAGOGASTRODUODENOSCOPY (EGD) WITH PROPOFOL N/A 01/22/2017   Procedure: ESOPHAGOGASTRODUODENOSCOPY (EGD) WITH PROPOFOL;  Surgeon: Toledo, Benay Pike, MD;  Location: [facility] ENDOSCOPY;  Service: Gastroenterology;  Laterality: N/A;  . EYE SURGERY    . OPEN REDUCTION INTERNAL FIXATION (ORIF) DISTAL RADIAL FRACTURE Right 02/09/2018   Procedure: OPEN REDUCTION  INTERNAL FIXATION (ORIF) DISTAL RADIAL FRACTURE;  Surgeon: Hessie Knows, MD;  Location: [facility] ORS;  Service: Orthopedics;  Laterality: Right;  . TONSILLECTOMY      There were no vitals filed for this visit.  Subjective Assessment - 03/24/18 0935    Subjective   Playing this evening piano at little concert  - using my hand little more - pain is good- and exercises done okay     Patient Stated Goals  I want to be able to use my R hand and wrist like before and play piano     Currently in Pain?  No/denies         [facility] OT Assessment - 03/24/18 0001      AROM   Right Forearm Pronation  90 Degrees    Right Forearm Supination  80 Degrees    Right Wrist Extension  57 Degrees    Right Wrist Flexion  60 Degrees    Right Wrist Radial Deviation  35 Degrees    Right Wrist Ulnar Deviation  22 Degrees      Strength   Right Hand Grip (lbs)  29    Right Hand Lateral Pinch  12 lbs    Right Hand 3 Point Pinch  10 lbs    Left Hand Grip (lbs)  46    Left Hand Lateral Pinch  16 lbs    Left Hand 3 Point Pinch  14 lbs       Assess  AROM for wrist R in all planes- great progress  And assess grip /prehension strength - see flowsheet  scar assess - and still adhere distally - done scar massage and mobs by OT after fluido and provided new cica scar pad for night time use         OT Treatments/Exercises (OP) - 03/24/18 0001      RUE Fluidotherapy   Number Minutes Fluidotherapy  9 Minutes    RUE Fluidotherapy Location  Hand;Wrist    Comments  AROM for wrist in all planes         Cont with wrist AAROM and PROM  For wrist at home- review with pt to do correctly - need min A - for flexion, ext, RD, UD -  10 reps  Can cont with tendon glides   And cont contrast and scar management    add and review with pt new strengthening - 1 lbs weight for wrist in all planes   pt can use if needed 18 oz hammer  Had no pain - slight pull less than 1-2/10   And light blue putty for grip , lat and 3  point grip ( can also alternate digits or 2 point alternate  10 -15 reps  each 2 x day  Increase to 2 sets   2 x day in 3 days and then 3 sets 6 days  If pain do not increase       OT Education - 03/24/18 1053    Education Details  add strengthening for wrist and putty to HEP     Person(s) Educated  Patient    Methods  Explanation;Demonstration;Handout    Comprehension  Returned demonstration;Verbalized understanding       OT Short Term Goals - 03/09/18 1643      OT SHORT TERM GOAL #1   Title  Pt to be ind in HEP to decrease scar tissue, desentitization to different textures and Increase AROM in wrist in all planes to use hand in more than 50% of ADL;s    Baseline  no knowledge of HEP -sterristrips removed this am - senstitive over scar and     Time  2    Period  Weeks    Status  New    Target Date  03/23/18      OT SHORT TERM GOAL #2   Title  R wrist AROM improve to [facility] to turn doorknob, use in bathroom hyiene and play piano    Baseline  Wrist AROM decrease in all planes - see flowsheet     Time  4    Period  Weeks    Status  New    Target Date  04/06/18      OT SHORT TERM GOAL #3   Title  Pt pain improve on PRWHE by more than 10 points     Baseline  at eval PRWHE score 17/50    Time  3    Period  Weeks    Status  New    Target Date  03/30/18        OT Long Term Goals - 03/09/18 1647      OT LONG TERM GOAL #1   Title  R wrist strength improve to 4+/5 for pt to lift and carry more than 5 lbs , push up from chair with out symptoms     Baseline  4 wks s/p - no strenghening yet     Time  6    Period  Weeks    Status  New    Target Date  04/20/18      OT LONG TERM GOAL #2   Title  R grip and prehension strength to be more than 50% compare to L grip to carry and lift object more than 5 lbs, push and pull door open , lift pots and pans     Baseline  no strength yet - 4 wks s/p    Time  8    Period  Weeks    Status  New    Target Date  05/04/18      OT LONG  TERM GOAL #3   Title  Function score on PRWHE improve more than 20 points     Baseline  at eval function score on PRWHE 37.5/50    Time  8    Period  Weeks    Status  New    Target Date  05/04/18            Plan - 03/24/18 1054    Clinical Impression Statement  Pt is 6 wks and 1 day s/p ORIF - making great progress in wrist AROM - review PROM again with pt - to clarrify correct position- tolerating strenghening with 1 lbs and light blue putty- no pain - pt to advance over the next week to 2 sets and 3 sets with no increase pain     Occupational performance deficits (Please refer to evaluation for details):  ADL's;IADL's;Play;Leisure    Rehab Potential  Good    Current Impairments/barriers affecting progress:   Pt with some sensation changes    OT Frequency  1x / week    OT Duration  4 weeks    OT Treatment/Interventions  Self-care/ADL training;Patient/family education;Splinting;Paraffin;Fluidtherapy;Scar mobilization;Manual Therapy;Passive range of motion;Contrast Bath;Therapeutic exercise    Plan  assess progress with strengthening HEP added this date     Clinical Decision Making  Multiple treatment options, significant modification of task necessary    OT Home Exercise Plan  see pt instruction    Consulted and Agree with Plan of Care  Patient       Patient will benefit from skilled therapeutic intervention in order to improve the following deficits and impairments:  Impaired flexibility, Pain, Impaired sensation, Decreased scar mobility, Decreased range of motion, Decreased strength, Impaired UE functional use  Visit Diagnosis: Stiffness of right wrist, not elsewhere classified  Pain in right wrist  Muscle weakness (generalized)  Scar condition and fibrosis of skin  Other disturbances of skin sensation    Problem List There are no active problems to display for this patient.   Rosalyn Gess OTR/L,CLT 03/24/2018, 10:58 AM  [facility] [address] [place], [address] Phone: [phone]   Fax:  [phone]  Name: Alison Bradshaw MRN: [id number] Date of Birth: 1940-10-09

## 2018-03-24 NOTE — Patient Instructions (Signed)
Cont with wrist AAROM and PROM  10 reps and tendon glides  cont contrast and scar management   but add strengthening - 1 lbs weight for wrist in all planes  And light blue putty for grip , lat and 3 point grip  10 -15 reps  each 2 x day  Increase to 2 sets   2 x day in 3 days and then 3 sets 6 days  If pain do not increase

## 2018-03-26 ENCOUNTER — Encounter: Payer: Medicare Other | Admitting: Occupational Therapy

## 2018-03-30 DIAGNOSIS — R911 Solitary pulmonary nodule: Secondary | ICD-10-CM | POA: Insufficient documentation

## 2018-03-30 DIAGNOSIS — J8410 Pulmonary fibrosis, unspecified: Secondary | ICD-10-CM | POA: Insufficient documentation

## 2018-04-03 ENCOUNTER — Ambulatory Visit: Payer: Medicare Other | Admitting: Occupational Therapy

## 2018-04-03 DIAGNOSIS — M25531 Pain in right wrist: Secondary | ICD-10-CM

## 2018-04-03 DIAGNOSIS — L905 Scar conditions and fibrosis of skin: Secondary | ICD-10-CM

## 2018-04-03 DIAGNOSIS — M6281 Muscle weakness (generalized): Secondary | ICD-10-CM

## 2018-04-03 DIAGNOSIS — M25631 Stiffness of right wrist, not elsewhere classified: Secondary | ICD-10-CM | POA: Diagnosis not present

## 2018-04-03 DIAGNOSIS — R208 Other disturbances of skin sensation: Secondary | ICD-10-CM

## 2018-04-03 NOTE — Patient Instructions (Signed)
Same scar massage  And ROM HEP  But increase weight for wrist to 2 lbs - all planes  2 x day 10-12 reps And putty increase for gripping to teal  10-12 reps  increase in 3 days to 2 sets and 6 days to 3 sets if no pain

## 2018-04-03 NOTE — Therapy (Signed)
[facility] [address], [place], [address] Phone: [phone]   Fax:  [phone]  Occupational Therapy Treatment  Patient Details  Name: Alison Bradshaw MRN: [id number] Date of Birth: [birth year] Referring Provider (OT): Rudene Christians   Encounter Date: 04/03/2018  OT End of Session - 04/03/18 0958    Visit Number  6    Number of Visits  12    Date for OT Re-Evaluation  05/04/18    OT Start Time  0916    OT Stop Time  0954    OT Time Calculation (min)  38 min    Activity Tolerance  Patient tolerated treatment well    Behavior During Therapy  [facility] for tasks assessed/performed       Past Medical History:  Diagnosis Date  . Anxiety   . Asthma   . Chronic gastritis   . Duodenal ulcer   . GERD (gastroesophageal reflux disease)   . Osteopenia   . Rosacea   . Senile nuclear sclerosis   . 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 BREAST BIOPSY Left 2008   neg  . BREAST EXCISIONAL BIOPSY Left 2004   neg  . BREAST SURGERY    . CATARACT EXTRACTION W/ INTRAOCULAR LENS  IMPLANT, BILATERAL Bilateral   . COLONOSCOPY WITH PROPOFOL N/A 01/22/2017   Procedure: COLONOSCOPY WITH PROPOFOL;  Surgeon: Toledo, Benay Pike, MD;  Location: [facility] ENDOSCOPY;  Service: Gastroenterology;  Laterality: N/A;  . ESOPHAGOGASTRODUODENOSCOPY (EGD) WITH PROPOFOL N/A 01/20/2015   Procedure: ESOPHAGOGASTRODUODENOSCOPY (EGD) WITH PROPOFOL;  Surgeon: Lollie Sails, MD;  Location: [facility] ENDOSCOPY;  Service: Endoscopy;  Laterality: N/A;  . ESOPHAGOGASTRODUODENOSCOPY (EGD) WITH PROPOFOL N/A 01/22/2017   Procedure: ESOPHAGOGASTRODUODENOSCOPY (EGD) WITH PROPOFOL;  Surgeon: Toledo, Benay Pike, MD;  Location: [facility] ENDOSCOPY;  Service: Gastroenterology;  Laterality: N/A;  . EYE SURGERY    . OPEN REDUCTION INTERNAL FIXATION (ORIF) DISTAL RADIAL FRACTURE Right 02/09/2018   Procedure: OPEN REDUCTION  INTERNAL FIXATION (ORIF) DISTAL RADIAL FRACTURE;  Surgeon: Hessie Knows, MD;  Location: [facility] ORS;  Service: Orthopedics;  Laterality: Right;  . TONSILLECTOMY      There were no vitals filed for this visit.  Subjective Assessment - 04/03/18 0917    Subjective   I am doing better - that funny feeling in my palm getting better, more range and strength - still some pain on the top of forearm at times - still wearing black splint at times if I do something heavy     Patient Stated Goals  I want to be able to use my R hand and wrist like before and play piano     Currently in Pain?  No/denies         [facility] OT Assessment - 04/03/18 0001      AROM   Right Forearm Pronation  90 Degrees    Right Forearm Supination  85 Degrees    Right Wrist Extension  63 Degrees    Right Wrist Flexion  63 Degrees      Strength   Right Hand Grip (lbs)  40    Right Hand Lateral Pinch  15 lbs    Right Hand 3 Point Pinch  13 lbs    Left Hand Grip (lbs)  46    Left Hand Lateral Pinch  16 lbs    Left Hand 3 Point Pinch  14 lbs        cont to  make great progress in AROM and strength   tender over distal radius head and Finkelstein positive - but could be fracture site - scar improving and sensation changes improving in palm per pt        OT Treatments/Exercises (OP) - 04/03/18 0001      RUE Fluidotherapy   Number Minutes Fluidotherapy  8 Minutes    RUE Fluidotherapy Location  Hand;Wrist    Comments  AROM prior to soft tissue and  AROM        Cont with wrist AAROM and PROM  For wrist at home- review with pt to do correctly -for flexion, ext, RD, UD -  10 reps  Can cont with tendon glides  and scar management     increase wrist to 2 lbs weight  for wrist in all planes   Had no pain - slight pull less than 1-2/10  To do 10-12 reps   And  Upgrade to teal  putty for grip only  ,10 -15 reps  each 2 x day  Increase to 2 sets , 2 x day in 3 days and then 3 sets 6 days  With weight and putty  If  pain do not increase        OT Education - 04/03/18 0958    Education Details  increase strengthening for wrist and putty to HEP     Person(s) Educated  Patient    Methods  Explanation;Demonstration;Handout    Comprehension  Returned demonstration;Verbalized understanding       OT Short Term Goals - 04/03/18 1317      OT SHORT TERM GOAL #1   Title  Pt to be ind in HEP to decrease scar tissue, desentitization to different textures and Increase AROM in wrist in all planes to use hand in more than 50% of ADL;s    Status  Achieved      OT SHORT TERM GOAL #2   Title  R wrist AROM improve to [facility] to turn doorknob, use in bathroom hyiene and play piano    Status  Achieved      OT SHORT TERM GOAL #3   Title  Pt pain improve on PRWHE by more than 10 points     Baseline  at eval PRWHE score 17/50 - to be assess next visit    Time  3    Period  Weeks    Status  On-going    Target Date  04/24/18        OT Long Term Goals - 04/03/18 1318      OT LONG TERM GOAL #1   Title  R wrist strength improve to 4+/5 for pt to lift and carry more than 5 lbs , push up from chair with out symptoms     Baseline  Pt at 2 lbs this date - wiht slight pull - can take some weight on table thru hand    Time  3    Period  Weeks    Status  On-going    Target Date  04/20/18      OT LONG TERM GOAL #2   Title  R grip and prehension strength to be more than 50% compare to L grip to carry and lift object more than 5 lbs, push and pull door open , lift pots and pans     Baseline  see flowsheet- great progress - but not at 2 lbs for wrist now - and not pulling or pushing doors open yet  Time  4    Period  Weeks    Status  On-going    Target Date  05/04/18      OT LONG TERM GOAL #3   Title  Function score on PRWHE improve more than 20 points     Baseline  at eval function score on PRWHE 37.5/50 - improving to be assess next session     Time  4    Period  Weeks    Status  On-going    Target Date   05/04/18            Plan - 04/03/18 0959    Clinical Impression Statement  Pt is about 7 1/2 wks s/p ORIF R wrist - cont to make great progress in AROM at wrist and increase  strength - able to increase HEP to 2 lbs and putty resistance- did had her stop prehension strength - made great progress - but she do have some pain on radial side of wrist and positive Wynn [place]lin - but could be too her distal radius fx - pt to increase reps and sets and wean out of her hard splint more     Occupational performance deficits (Please refer to evaluation for details):  ADL's;IADL's;Play;Leisure    Rehab Potential  Good    Current Impairments/barriers affecting progress:   Pt with some sensation changes    OT Frequency  1x / week    OT Duration  4 weeks    OT Treatment/Interventions  Self-care/ADL training;Patient/family education;Splinting;Paraffin;Fluidtherapy;Scar mobilization;Manual Therapy;Passive range of motion;Contrast Bath;Therapeutic exercise    Plan  assess progress with strengthening HEP  and do PRWHE     Clinical Decision Making  Multiple treatment options, significant modification of task necessary    OT Home Exercise Plan  see pt instruction    Consulted and Agree with Plan of Care  Patient       Patient will benefit from skilled therapeutic intervention in order to improve the following deficits and impairments:  Impaired flexibility, Pain, Impaired sensation, Decreased scar mobility, Decreased range of motion, Decreased strength, Impaired UE functional use  Visit Diagnosis: Stiffness of right wrist, not elsewhere classified  Pain in right wrist  Muscle weakness (generalized)  Scar condition and fibrosis of skin  Other disturbances of skin sensation    Problem List There are no active problems to display for this patient.   Rosalyn Gess OTR/L,CLT 04/03/2018, 1:20 PM  [facility] [address] [place], [address] Phone: [phone]   Fax:  [phone]  Name: Alison Bradshaw MRN: [id number] Date of Birth: 1940/10/31

## 2018-04-13 ENCOUNTER — Ambulatory Visit: Payer: Medicare Other | Attending: Orthopedic Surgery | Admitting: Occupational Therapy

## 2018-04-13 DIAGNOSIS — M6281 Muscle weakness (generalized): Secondary | ICD-10-CM | POA: Diagnosis present

## 2018-04-13 DIAGNOSIS — M25631 Stiffness of right wrist, not elsewhere classified: Secondary | ICD-10-CM | POA: Diagnosis present

## 2018-04-13 DIAGNOSIS — L905 Scar conditions and fibrosis of skin: Secondary | ICD-10-CM | POA: Diagnosis present

## 2018-04-13 DIAGNOSIS — R208 Other disturbances of skin sensation: Secondary | ICD-10-CM | POA: Diagnosis present

## 2018-04-13 DIAGNOSIS — M25531 Pain in right wrist: Secondary | ICD-10-CM

## 2018-04-13 NOTE — Patient Instructions (Signed)
Increase to 3 sets  2 lbs weight  1 x day  Cont with PROM and AROM HEP  And up putty for gripping to green - 15-20 reps  1 x day  And then teal putty twisting and pulling 15  Reps  for week and increase to green in week or 2  Should be pain free

## 2018-04-13 NOTE — Therapy (Signed)
[place] [address] [place], [address] Phone: [phone]   Fax:  [phone]  Occupational Therapy Treatment  Patient Details  Name: Alison Bradshaw MRN: [id number] Date of Birth: [birth year] Referring Provider (OT): Rudene Christians   Encounter Date: 04/13/2018  OT End of Session - 04/13/18 1229    Visit Number  7    Number of Visits  12    Date for OT Re-Evaluation  05/04/18    OT Start Time  6962    OT Stop Time  1227    OT Time Calculation (min)  38 min    Activity Tolerance  Patient tolerated treatment well    Behavior During Therapy  [facility] for tasks assessed/performed       Past Medical History:  Diagnosis Date  . Anxiety   . Asthma   . Chronic gastritis   . Duodenal ulcer   . GERD (gastroesophageal reflux disease)   . Osteopenia   . Rosacea   . Senile nuclear sclerosis   . 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 BREAST BIOPSY Left 2008   neg  . BREAST EXCISIONAL BIOPSY Left 2004   neg  . BREAST SURGERY    . CATARACT EXTRACTION W/ INTRAOCULAR LENS  IMPLANT, BILATERAL Bilateral   . COLONOSCOPY WITH PROPOFOL N/A 01/22/2017   Procedure: COLONOSCOPY WITH PROPOFOL;  Surgeon: Toledo, Benay Pike, MD;  Location: [facility] ENDOSCOPY;  Service: Gastroenterology;  Laterality: N/A;  . ESOPHAGOGASTRODUODENOSCOPY (EGD) WITH PROPOFOL N/A 01/20/2015   Procedure: ESOPHAGOGASTRODUODENOSCOPY (EGD) WITH PROPOFOL;  Surgeon: Lollie Sails, MD;  Location: [facility] ENDOSCOPY;  Service: Endoscopy;  Laterality: N/A;  . ESOPHAGOGASTRODUODENOSCOPY (EGD) WITH PROPOFOL N/A 01/22/2017   Procedure: ESOPHAGOGASTRODUODENOSCOPY (EGD) WITH PROPOFOL;  Surgeon: Toledo, Benay Pike, MD;  Location: [facility] ENDOSCOPY;  Service: Gastroenterology;  Laterality: N/A;  . EYE SURGERY    . OPEN REDUCTION INTERNAL FIXATION (ORIF) DISTAL RADIAL FRACTURE Right 02/09/2018   Procedure: OPEN REDUCTION  INTERNAL FIXATION (ORIF) DISTAL RADIAL FRACTURE;  Surgeon: Hessie Knows, MD;  Location: [facility] ORS;  Service: Orthopedics;  Laterality: Right;  . TONSILLECTOMY      There were no vitals filed for this visit.  Subjective Assessment - 04/13/18 1227    Subjective   I seen the surgeon - was happy with progress- to wear only splint when out and about or hiking- can do like gallon but use both hands - can cut food, do buttons, open jars     Patient Stated Goals  I want to be able to use my R hand and wrist like before and play piano     Currently in Pain?  No/denies         [facility] OT Assessment - 04/13/18 0001      AROM   Right Forearm Pronation  90 Degrees    Right Forearm Supination  85 Degrees    Right Wrist Extension  70 Degrees    Right Wrist Flexion  75 Degrees      Strength   Right Hand Grip (lbs)  44    Right Hand Lateral Pinch  15 lbs    Right Hand 3 Point Pinch  13 lbs    Left Hand Grip (lbs)  55    Left Hand Lateral Pinch  16 lbs    Left Hand 3 Point Pinch  15 lbs        great progress in AROM for wrist -  cont with end range sup PROM , wrist flexion and extention  Wean out of splints  Assess pt can carry 4 lbs without pull and lift  But had pull with 5 and 6 lbs        Increase to 3 sets  2 lbs weight  1 x day for wrist  Cont with PROM and AROM HEP  And up putty resistance  for gripping to green - 15-20 reps  1 x day  And then teal putty but add  twisting and pulling with all digit  15  Reps  for week and increase to green in week or 2 if no pain  Should be pain free          OT Education - 04/13/18 1229    Education Details  increase strengthening for wrist and putty to HEP     Person(s) Educated  Patient    Methods  Explanation;Demonstration;Handout    Comprehension  Returned demonstration;Verbalized understanding       OT Short Term Goals - 04/13/18 1233      OT SHORT TERM GOAL #1   Title  Pt to be ind in HEP to decrease scar tissue,  desentitization to different textures and Increase AROM in wrist in all planes to use hand in more than 50% of ADL;s    Status  Achieved      OT SHORT TERM GOAL #2   Title  R wrist AROM improve to [facility] to turn doorknob, use in bathroom hyiene and play piano    Status  Achieved      OT SHORT TERM GOAL #3   Title  Pt pain improve on PRWHE by more than 10 points     Baseline  at eval PRWHE score 17/50 -progressing and assess next session     Time  3    Period  Weeks    Status  On-going    Target Date  04/24/18        OT Long Term Goals - 04/13/18 1234      OT LONG TERM GOAL #1   Title  R wrist strength improve to 4+/5 for pt to lift and carry more than 5 lbs , push up from chair with out symptoms     Baseline  Pt at 2 lbs this date - can pick up and carry 4 lbs - but had slight pull with 5 and 6 lbs  - can take some weight on table thru hand    Time  3    Period  Weeks    Status  On-going    Target Date  04/20/18      OT LONG TERM GOAL #2   Title  R grip and prehension strength to be more than 50% compare to L grip to carry and lift object more than 5 lbs, push and pull door open , lift pots and pans     Baseline  see flowsheet- great progress - but at 4 lbs carrying and lifting      Time  4    Period  Weeks    Status  On-going    Target Date  05/04/18      OT LONG TERM GOAL #3   Title  Function score on PRWHE improve more than 20 points     Baseline  at eval function score on PRWHE 37.5/50 - improving to be assess next session     Time  4    Period  Weeks  Status  On-going    Target Date  05/04/18            Plan - 04/13/18 1230    Clinical Impression Statement  Pt about 8 1/2 wks s/p ORIF R wrist - pt showed great progress in ROM and strength - able to carry 4 lbs without increase symptoms - but some pulll at wrist with 5 and 6 lbs - pt to increase set with 2 lbs weight and increase her to med firm green putty for grip , pulling and twisting - follow up in 3 wks      Occupational performance deficits (Please refer to evaluation for details):  ADL's;IADL's;Play;Leisure    Rehab Potential  Good    Current Impairments/barriers affecting progress:   Pt with some sensation changes    OT Frequency  --   in 3wks    OT Duration  --   3 wks   OT Treatment/Interventions  Self-care/ADL training;Patient/family education;Splinting;Paraffin;Fluidtherapy;Scar mobilization;Manual Therapy;Passive range of motion;Contrast Bath;Therapeutic exercise    Plan  assess progress with strengthening and functional use  and do PRWHE     Clinical Decision Making  Multiple treatment options, significant modification of task necessary    OT Home Exercise Plan  see pt instruction    Consulted and Agree with Plan of Care  Patient       Patient will benefit from skilled therapeutic intervention in order to improve the following deficits and impairments:  Impaired flexibility, Pain, Impaired sensation, Decreased scar mobility, Decreased range of motion, Decreased strength, Impaired UE functional use  Visit Diagnosis: Stiffness of right wrist, not elsewhere classified  Pain in right wrist  Muscle weakness (generalized)  Scar condition and fibrosis of skin  Other disturbances of skin sensation    Problem List There are no active problems to display for this patient.   Rosalyn Gess OTR/L,CLT 04/13/2018, 12:37 PM  [place] [facility] [address], [place], [address] Phone: [phone]   Fax:  [phone]  Name: Marsena Taff MRN: [id number] Date of Birth: 1940-10-25

## 2018-04-14 ENCOUNTER — Ambulatory Visit: Payer: Medicare Other | Admitting: Anesthesiology

## 2018-04-14 ENCOUNTER — Other Ambulatory Visit: Payer: Self-pay

## 2018-04-14 ENCOUNTER — Encounter
Admission: RE | Disposition: A | Discharge status: Home / Self Care | Payer: Self-pay | Source: Ambulatory Visit | Attending: Gastroenterology

## 2018-04-14 ENCOUNTER — Encounter: Payer: Self-pay | Admitting: Student

## 2018-04-14 ENCOUNTER — Ambulatory Visit
Admission: RE | Admit: 2018-04-14 | Discharge: 2018-04-14 | Disposition: A | Discharge status: Home / Self Care | Payer: Medicare Other | Source: Ambulatory Visit | Attending: Gastroenterology | Admitting: Gastroenterology

## 2018-04-14 DIAGNOSIS — K6389 Other specified diseases of intestine: Secondary | ICD-10-CM | POA: Insufficient documentation

## 2018-04-14 DIAGNOSIS — K219 Gastro-esophageal reflux disease without esophagitis: Secondary | ICD-10-CM | POA: Diagnosis present

## 2018-04-14 DIAGNOSIS — J45909 Unspecified asthma, uncomplicated: Secondary | ICD-10-CM | POA: Diagnosis not present

## 2018-04-14 DIAGNOSIS — F419 Anxiety disorder, unspecified: Secondary | ICD-10-CM | POA: Insufficient documentation

## 2018-04-14 DIAGNOSIS — Z7951 Long term (current) use of inhaled steroids: Secondary | ICD-10-CM | POA: Diagnosis not present

## 2018-04-14 DIAGNOSIS — K317 Polyp of stomach and duodenum: Secondary | ICD-10-CM | POA: Insufficient documentation

## 2018-04-14 DIAGNOSIS — E785 Hyperlipidemia, unspecified: Secondary | ICD-10-CM | POA: Diagnosis not present

## 2018-04-14 DIAGNOSIS — K210 Gastro-esophageal reflux disease with esophagitis: Secondary | ICD-10-CM | POA: Diagnosis not present

## 2018-04-14 DIAGNOSIS — K449 Diaphragmatic hernia without obstruction or gangrene: Secondary | ICD-10-CM | POA: Diagnosis not present

## 2018-04-14 DIAGNOSIS — R0789 Other chest pain: Secondary | ICD-10-CM | POA: Insufficient documentation

## 2018-04-14 DIAGNOSIS — K225 Diverticulum of esophagus, acquired: Secondary | ICD-10-CM | POA: Diagnosis not present

## 2018-04-14 DIAGNOSIS — K2950 Unspecified chronic gastritis without bleeding: Secondary | ICD-10-CM | POA: Diagnosis not present

## 2018-04-14 DIAGNOSIS — Z7982 Long term (current) use of aspirin: Secondary | ICD-10-CM | POA: Diagnosis not present

## 2018-04-14 DIAGNOSIS — R918 Other nonspecific abnormal finding of lung field: Secondary | ICD-10-CM | POA: Diagnosis not present

## 2018-04-14 DIAGNOSIS — Z79899 Other long term (current) drug therapy: Secondary | ICD-10-CM | POA: Insufficient documentation

## 2018-04-14 HISTORY — DX: Hyperlipidemia, unspecified: E78.5

## 2018-04-14 HISTORY — PX: ESOPHAGOGASTRODUODENOSCOPY (EGD) WITH PROPOFOL: SHX5813

## 2018-04-14 SURGERY — ESOPHAGOGASTRODUODENOSCOPY (EGD) WITH PROPOFOL
Anesthesia: General

## 2018-04-14 MED ORDER — SODIUM CHLORIDE 0.9 % IV SOLN
INTRAVENOUS | Status: DC | PRN
  Administered 2018-04-14: 13:00:00 via INTRAVENOUS
  Administered 2018-04-14: 1000 mL

## 2018-04-14 MED ORDER — PROPOFOL 10 MG/ML IV BOLUS
INTRAVENOUS | Status: DC | PRN
  Administered 2018-04-14: 20 mg via INTRAVENOUS
  Administered 2018-04-14: 50 mg via INTRAVENOUS

## 2018-04-14 MED ORDER — PROPOFOL 500 MG/50ML IV EMUL
INTRAVENOUS | Status: AC
  Filled 2018-04-14: qty 50

## 2018-04-14 MED ORDER — PROPOFOL 500 MG/50ML IV EMUL
INTRAVENOUS | Status: DC | PRN
  Administered 2018-04-14: 80 ug/kg/min via INTRAVENOUS

## 2018-04-14 NOTE — Op Note (Signed)
[facility] Gastroenterology Patient Name: Cilicia Borden Procedure Date: 04/14/2018 12:11 PM MRN: [id number] Account #: [id number] Date of Birth: 1941-01-03 Admit Type: Outpatient Age: 78 Room: [facility] ENDO ROOM 3 Gender: Female Note Status: Finalized Procedure:            Upper GI endoscopy Indications:          Gastro-esophageal reflux disease, Unexplained chest pain Providers:            Lollie Sails, MD Referring MD:         Baxter Hire, MD (Referring MD) Medicines:            Monitored Anesthesia Care Complications:        No immediate complications. Procedure:            Pre-Anesthesia Assessment:                       - ASA Grade Assessment: III - A patient with severe                        systemic disease.                       After obtaining informed consent, the endoscope was                        passed under direct vision. Throughout the procedure,                        the patient's blood pressure, pulse, and oxygen                        saturations were monitored continuously. The Endoscope                        was introduced through the mouth, and advanced to the                        fourth part of duodenum. The upper GI endoscopy was                        accomplished without difficulty. The patient tolerated                        the procedure well. Findings:      A small hiatal hernia was present.      The Z-line was regular. Biopsies were taken with a cold forceps for       Helicobacter pylori testing.      Patchy mild inflammation characterized by congestion (edema) and       erythema was found in the gastric body. Biopsies were taken with a cold       forceps for histology from the antrum and body of the stomach and placed       into separate jars.      The examined duodenum was normal.      The cardia and gastric fundus were normal on retroflexion otherwise,       note small hiatal hernia.      A non-bleeding  diverticulum with a small opening and no stigmata of       recent bleeding was found at the cricopharyngeus. Impression:           -  Small hiatal hernia.                       - Z-line regular. Biopsied.                       - Gastritis. Biopsied.                       - Normal examined duodenum. Recommendation:       - Discharge patient to home.                       - Await pathology results. Procedure Code(s):    --- Professional ---                       [phone], Esophagogastroduodenoscopy, flexible, transoral;                        with biopsy, single or multiple Diagnosis Code(s):    --- Professional ---                       K44.9, Diaphragmatic hernia without obstruction or                        gangrene                       K29.70, Gastritis, unspecified, without bleeding                       K21.9, Gastro-esophageal reflux disease without                        esophagitis                       R07.9, Chest pain, unspecified CPT copyright 2018 American Medical Association. All rights reserved. The codes documented in this report are preliminary and upon coder review may  be revised to meet current compliance requirements. Lollie Sails, MD 04/14/2018 1:54:03 PM This report has been signed electronically. Number of Addenda: 0 Note Initiated On: 04/14/2018 12:11 PM      [facility]

## 2018-04-14 NOTE — Anesthesia Post-op Follow-up Note (Signed)
Anesthesia QCDR form completed.        

## 2018-04-14 NOTE — H&P (Signed)
Outpatient short stay form Pre-procedure 04/14/2018 12:52 PM Lollie Sails MD  Primary Physician: Dr. Glendon Axe  Reason for visit: EGD  History of present illness: Patient is a 78 year old female presenting today for an EGD.  She has a history of atypical chest pain and GERD.  She has had a cardiac evaluation which is pending final follow-up, however her stress ECG results were normal echocardiogram normal, normal right ventricular systolic function, mild valvular regurgitation and no valvular stenosis..  Is also had a chest CT which showed some small nodules and she is in follow-up for that as well.  She takes no aspirin or blood thinning agent.   No current facility-administered medications for this encounter.   Facility-Administered Medications Ordered in Other Encounters:  .  0.9 %  sodium chloride infusion, , , Continuous PRN, Marcy Siren, CRNA  Medications Prior to Admission  Medication Sig Dispense Refill Last Dose  . acetaminophen (TYLENOL) 500 MG tablet Take 1,000 mg by mouth every 6 (six) hours as needed.   Past Week at Unknown time  . albuterol (PROVENTIL HFA;VENTOLIN HFA) 108 (90 Base) MCG/ACT inhaler Inhale 2 puffs into the lungs every 6 (six) hours as needed for shortness of breath. 1 Inhaler 0 Past Week at Unknown time  . ALPRAZolam (XANAX) 0.25 MG tablet Take 0.25 mg by mouth 2 (two) times daily as needed for anxiety.   04/13/2018 at 2200  . atorvastatin (LIPITOR) 10 MG tablet Take 10 mg by mouth daily.     Marland Kitchen atorvastatin (LIPITOR) 40 MG tablet Take 1 tablet (40 mg total) by mouth daily. 90 tablet 1 04/13/2018 at 2200  . pantoprazole (PROTONIX) 40 MG tablet Take 40 mg by mouth daily.   04/13/2018 at 2200  . sucralfate (CARAFATE) 1 G tablet Take 1 g by mouth 4 (four) times daily -  with meals and at bedtime. Pt only takes when she's really needs it which isn't very often   Past Month at Unknown time  . aspirin EC 81 MG tablet Take 1 tablet (81 mg total) by mouth daily.  (Patient not taking: Reported on 04/14/2018) 90 tablet 3 Not Taking at Unknown time  . fluticasone (FLONASE SENSIMIST) 27.5 MCG/SPRAY nasal spray Place 2 sprays into the nose daily. 10 g 12 02/08/2018 at Unknown time  . Fluticasone-Salmeterol (AIRDUO RESPICLICK 250/53) 976-73 MCG/ACT AEPB Inhale 1 puff into the lungs 2 (two) times daily. (Patient not taking: Reported on 03/09/2018) 1 each 10 Not Taking at Unknown time  . ondansetron (ZOFRAN) 4 MG tablet Take 1 tablet (4 mg total) by mouth daily as needed for nausea or vomiting. (Patient not taking: Reported on 04/14/2018) 10 tablet 0 Not Taking at Unknown time     Allergies  Allergen Reactions  . Percocet [Oxycodone-Acetaminophen] Nausea Only  . Ceftin [Cefuroxime Axetil] Hives and Rash  . Timentin [Ticarcillin-Pot Clavulanate] Hives and Rash     Past Medical History:  Diagnosis Date  . Anxiety   . Asthma   . Chronic gastritis   . Duodenal ulcer   . GERD (gastroesophageal reflux disease)   . Hyperlipidemia   . Osteopenia   . Rosacea   . Senile nuclear sclerosis   . 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    HEENT: Normocephalic atraumatic eyes are anicteric    Other:    Pertinant  exam for procedure: Soft, tender to palpation in the upper epigastrium.  There are no masses or rebound.  Sounds are positive normoactive    Planned proceedures: EGD and indicated procedures. I have discussed the risks benefits and complications of procedures to include not limited to bleeding, infection, perforation and the risk of sedation and the patient wishes to proceed.    Lollie Sails, MD Gastroenterology 04/14/2018  12:52 PM

## 2018-04-14 NOTE — Anesthesia Preprocedure Evaluation (Addendum)
Anesthesia Evaluation  Patient identified by MRN, date of birth, ID band Patient awake    Reviewed: Allergy & Precautions, H&P , NPO status , Patient's Chart, lab work & pertinent test results  Airway Mallampati: II       Dental  (+) Teeth Intact   Pulmonary asthma ,           Cardiovascular negative cardio ROS       Neuro/Psych PSYCHIATRIC DISORDERS Anxiety negative neurological ROS     GI/Hepatic Neg liver ROS, PUD, GERD  ,  Endo/Other  negative endocrine ROS  Renal/GU negative Renal ROS  negative genitourinary   Musculoskeletal   Abdominal   Peds  Hematology negative hematology ROS (+)   Anesthesia Other Findings Past Medical History: No date: Anxiety No date: Asthma No date: Chronic gastritis No date: Duodenal ulcer No date: GERD (gastroesophageal reflux disease) No date: Osteopenia No date: Rosacea No date: Senile nuclear sclerosis 01/20/2015: Zenker's diverticulum  Past Surgical History: 2008: BREAST BIOPSY; Left     Comment:  neg 2004: BREAST EXCISIONAL BIOPSY; Left     Comment:  neg No date: BREAST SURGERY No date: CATARACT EXTRACTION W/ INTRAOCULAR LENS  IMPLANT, BILATERAL;  Bilateral 01/22/2017: COLONOSCOPY WITH PROPOFOL; N/A     Comment:  Procedure: COLONOSCOPY WITH PROPOFOL;  Surgeon: Toledo,               Benay Pike, MD;  Location: [facility] ENDOSCOPY;  Service:               Gastroenterology;  Laterality: N/A; 01/20/2015: ESOPHAGOGASTRODUODENOSCOPY (EGD) WITH PROPOFOL; N/A     Comment:  Procedure: ESOPHAGOGASTRODUODENOSCOPY (EGD) WITH               PROPOFOL;  Surgeon: Lollie Sails, MD;  Location:               [facility] ENDOSCOPY;  Service: Endoscopy;  Laterality: N/A; 01/22/2017: ESOPHAGOGASTRODUODENOSCOPY (EGD) WITH PROPOFOL; N/A     Comment:  Procedure: ESOPHAGOGASTRODUODENOSCOPY (EGD) WITH               PROPOFOL;  Surgeon: Toledo, Benay Pike, MD;  Location:               [facility] ENDOSCOPY;   Service: Gastroenterology;  Laterality:               N/A; No date: EYE SURGERY 02/09/2018: OPEN REDUCTION INTERNAL FIXATION (ORIF) DISTAL RADIAL  FRACTURE; Right     Comment:  Procedure: OPEN REDUCTION INTERNAL FIXATION (ORIF)               DISTAL RADIAL FRACTURE;  Surgeon: Hessie Knows, MD;                Location: [facility] ORS;  Service: Orthopedics;  Laterality:               Right; No date: TONSILLECTOMY     Reproductive/Obstetrics negative OB ROS                            Anesthesia Physical Anesthesia Plan  ASA: II  Anesthesia Plan: General   Post-op Pain Management:    Induction:   PONV Risk Score and Plan: Propofol infusion and TIVA  Airway Management Planned: Natural Airway and Nasal Cannula  Additional Equipment:   Intra-op Plan:   Post-operative Plan:   Informed Consent: I have reviewed the patients History and Physical, chart, labs and discussed the procedure including the risks,  benefits and alternatives for the proposed anesthesia with the patient or authorized representative who has indicated his/her understanding and acceptance.   Dental Advisory Given  Plan Discussed with: Anesthesiologist, CRNA and Surgeon  Anesthesia Plan Comments:         Anesthesia Quick Evaluation

## 2018-04-14 NOTE — Transfer of Care (Signed)
Immediate Anesthesia Transfer of Care Note  Patient: Alison Bradshaw  Procedure(s) Performed: ESOPHAGOGASTRODUODENOSCOPY (EGD) WITH PROPOFOL (N/A )  Patient Location: PACU  Anesthesia Type:General  Level of Consciousness: awake  Airway & Oxygen Therapy: Patient Spontanous Breathing  Post-op Assessment: Report given to RN  Post vital signs: stable  Last Vitals:  Vitals Value Taken Time  BP 121/62 04/14/2018  1:51 PM  Temp    Pulse 58 04/14/2018  1:51 PM  Resp 12 04/14/2018  1:51 PM  SpO2 100 % 04/14/2018  1:51 PM    Last Pain:  Vitals:   04/14/18 1243  TempSrc: Tympanic  PainSc: 0-No pain         Complications: No apparent anesthesia complications

## 2018-04-15 ENCOUNTER — Encounter: Payer: Self-pay | Admitting: Gastroenterology

## 2018-04-16 LAB — SURGICAL PATHOLOGY

## 2018-04-16 NOTE — Anesthesia Postprocedure Evaluation (Signed)
Anesthesia Post Note  Patient: Alison Bradshaw  Procedure(s) Performed: ESOPHAGOGASTRODUODENOSCOPY (EGD) WITH PROPOFOL (N/A )  Patient location during evaluation: PACU Anesthesia Type: General Level of consciousness: awake and alert Pain management: pain level controlled Vital Signs Assessment: post-procedure vital signs reviewed and stable Respiratory status: spontaneous breathing, nonlabored ventilation, respiratory function stable and patient connected to nasal cannula oxygen Cardiovascular status: blood pressure returned to baseline and stable Postop Assessment: no apparent nausea or vomiting Anesthetic complications: no     Last Vitals:  Vitals:   04/14/18 1351 04/14/18 1400  BP: 121/62 103/83  Pulse: (!) 58   Resp: 12   Temp:    SpO2: 100%     Last Pain:  Vitals:   04/15/18 0753  TempSrc:   PainSc: 0-No pain                 Molli Barrows

## 2018-05-01 ENCOUNTER — Ambulatory Visit: Payer: Medicare Other | Admitting: Occupational Therapy

## 2018-05-01 DIAGNOSIS — M25631 Stiffness of right wrist, not elsewhere classified: Secondary | ICD-10-CM

## 2018-05-01 DIAGNOSIS — L905 Scar conditions and fibrosis of skin: Secondary | ICD-10-CM

## 2018-05-01 DIAGNOSIS — M6281 Muscle weakness (generalized): Secondary | ICD-10-CM

## 2018-05-01 DIAGNOSIS — R208 Other disturbances of skin sensation: Secondary | ICD-10-CM

## 2018-05-01 DIAGNOSIS — M25531 Pain in right wrist: Secondary | ICD-10-CM

## 2018-05-01 NOTE — Therapy (Signed)
[facility] [address], [place], [address] Phone: [phone]   Fax:  [phone]  Occupational Therapy Treatment  Patient Details  Name: Alison Bradshaw MRN: [id number] Date of Birth: [birth year] Referring Provider (OT): Rudene Christians   Encounter Date: 05/01/2018  OT End of Session - 05/01/18 1458    Visit Number  8    Number of Visits  8    Date for OT Re-Evaluation  05/01/18    OT Start Time  1132    OT Stop Time  1200    OT Time Calculation (min)  28 min    Activity Tolerance  Patient tolerated treatment well    Behavior During Therapy  [facility] for tasks assessed/performed       Past Medical History:  Diagnosis Date  . Anxiety   . Asthma   . Chronic gastritis   . Duodenal ulcer   . GERD (gastroesophageal reflux disease)   . Hyperlipidemia   . Osteopenia   . Rosacea   . Senile nuclear sclerosis   . 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 BREAST BIOPSY Left 2008   neg  . BREAST EXCISIONAL BIOPSY Left 2004   neg  . BREAST SURGERY    . CATARACT EXTRACTION W/ INTRAOCULAR LENS  IMPLANT, BILATERAL Bilateral   . COLONOSCOPY WITH PROPOFOL N/A 01/22/2017   Procedure: COLONOSCOPY WITH PROPOFOL;  Surgeon: Toledo, Benay Pike, MD;  Location: [facility] ENDOSCOPY;  Service: Gastroenterology;  Laterality: N/A;  . ESOPHAGOGASTRODUODENOSCOPY (EGD) WITH PROPOFOL N/A 01/20/2015   Procedure: ESOPHAGOGASTRODUODENOSCOPY (EGD) WITH PROPOFOL;  Surgeon: Lollie Sails, MD;  Location: [facility] ENDOSCOPY;  Service: Endoscopy;  Laterality: N/A;  . ESOPHAGOGASTRODUODENOSCOPY (EGD) WITH PROPOFOL N/A 01/22/2017   Procedure: ESOPHAGOGASTRODUODENOSCOPY (EGD) WITH PROPOFOL;  Surgeon: Toledo, Benay Pike, MD;  Location: [facility] ENDOSCOPY;  Service: Gastroenterology;  Laterality: N/A;  . ESOPHAGOGASTRODUODENOSCOPY (EGD) WITH PROPOFOL N/A 04/14/2018   Procedure: ESOPHAGOGASTRODUODENOSCOPY (EGD)  WITH PROPOFOL;  Surgeon: Lollie Sails, MD;  Location: [facility] ENDOSCOPY;  Service: Endoscopy;  Laterality: N/A;  . EYE SURGERY    . OPEN REDUCTION INTERNAL FIXATION (ORIF) DISTAL RADIAL FRACTURE Right 02/09/2018   Procedure: OPEN REDUCTION INTERNAL FIXATION (ORIF) DISTAL RADIAL FRACTURE;  Surgeon: Hessie Knows, MD;  Location: [facility] ORS;  Service: Orthopedics;  Laterality: Right;  . TONSILLECTOMY      There were no vitals filed for this visit.  Subjective Assessment - 05/01/18 1457    Subjective   It is going be to the 3rd of Febr - 3 months since my surgery - doing well - using it in most everything and did not do my exercises - was out of town for week - do not have pain     Patient Stated Goals  I want to be able to use my R hand and wrist like before and play piano     Currently in Pain?  No/denies         [facility] OT Assessment - 05/01/18 0001      Strength   Right Hand Grip (lbs)  54    Right Hand Lateral Pinch  16 lbs    Right Hand 3 Point Pinch  15 lbs    Left Hand Grip (lbs)  55    Left Hand Lateral Pinch  16 lbs    Left Hand 3 Point Pinch  15 lbs       great progress - AROM in wrist WNL -  except wrist flexion 75-80 degrees  Strength 5/5 and now pain in all planes  Pt able to push up on hand, carry 10 lbs and lift - no issues  Return to doing all act -  Denies pain or sensory issues                 OT Education - 05/01/18 1458    Education Details  progress and discharge instruction     Person(s) Educated  Patient    Methods  Explanation;Demonstration    Comprehension  Returned demonstration;Verbalized understanding       OT Short Term Goals - 05/01/18 1500      OT SHORT TERM GOAL #1   Title  Pt to be ind in HEP to decrease scar tissue, desentitization to different textures and Increase AROM in wrist in all planes to use hand in more than 50% of ADL;s    Status  Achieved      OT SHORT TERM GOAL #2   Title  R wrist AROM improve to [facility] to turn  doorknob, use in bathroom hyiene and play piano    Status  Achieved      OT SHORT TERM GOAL #3   Title  Pt pain improve on PRWHE by more than 10 points     Baseline  at eval PRWHE score 17/50 -and now 0/50    Status  Achieved        OT Long Term Goals - 05/01/18 1501      OT LONG TERM GOAL #1   Title  R wrist strength improve to 4+/5 for pt to lift and carry more than 5 lbs , push up from chair with out symptoms     Status  Achieved      OT LONG TERM GOAL #2   Title  R grip and prehension strength to be more than 50% compare to L grip to carry and lift object more than 5 lbs, push and pull door open , lift pots and pans     Status  Achieved      OT LONG TERM GOAL #3   Title  Function score on PRWHE improve more than 20 points     Baseline  at eval function score on PRWHE 37.5/50 - and now 0/50     Status  Achieved            Plan - 05/01/18 1458    Clinical Impression Statement  Pt is about 10 1/2 wks out from s/p  ORIF R wrist - pt did fantastic and has nearly normal AROM in wrist in all planes and strength WFL - her grip and prehension strength in even with her L hand now and she denies any pain or numbness- and also using it normally - pt met all goals and discharge     Plan  use normally - if painfull - try in few days again     Consulted and Agree with Plan of Care  Patient       Patient will benefit from skilled therapeutic intervention in order to improve the following deficits and impairments:     Visit Diagnosis: Stiffness of right wrist, not elsewhere classified  Pain in right wrist  Muscle weakness (generalized)  Scar condition and fibrosis of skin  Other disturbances of skin sensation    Problem List There are no active problems to display for this patient.   Rosalyn Gess OTR/L,CLT 05/01/2018, 3:02 PM  [facility] [address] [place], [address] Phone: [phone]    Fax:  [phone]  Name: Glessie Eustice MRN: [id number] Date of Birth: 1941-02-26

## 2018-05-06 ENCOUNTER — Other Ambulatory Visit: Payer: Self-pay | Admitting: Gastroenterology

## 2018-05-06 DIAGNOSIS — R911 Solitary pulmonary nodule: Secondary | ICD-10-CM

## 2018-05-08 ENCOUNTER — Ambulatory Visit
Admission: RE | Admit: 2018-05-08 | Discharge: 2018-05-08 | Disposition: A | Discharge status: Home / Self Care | Payer: Medicare Other | Source: Ambulatory Visit | Attending: Gastroenterology | Admitting: Gastroenterology

## 2018-05-08 DIAGNOSIS — R911 Solitary pulmonary nodule: Secondary | ICD-10-CM | POA: Insufficient documentation

## 2018-05-14 ENCOUNTER — Encounter: Payer: Self-pay | Admitting: Pulmonary Disease

## 2018-05-14 ENCOUNTER — Ambulatory Visit: Payer: Medicare Other | Admitting: Pulmonary Disease

## 2018-05-14 ENCOUNTER — Other Ambulatory Visit
Admission: RE | Admit: 2018-05-14 | Discharge: 2018-05-14 | Disposition: A | Discharge status: Home / Self Care | Payer: Medicare Other | Source: Ambulatory Visit | Attending: Pulmonary Disease | Admitting: Pulmonary Disease

## 2018-05-14 VITALS — BP 118/70 | HR 72 | Resp 16 | Ht 63.0 in | Wt 149.0 lb

## 2018-05-14 DIAGNOSIS — R918 Other nonspecific abnormal finding of lung field: Secondary | ICD-10-CM | POA: Diagnosis not present

## 2018-05-14 DIAGNOSIS — J8410 Pulmonary fibrosis, unspecified: Secondary | ICD-10-CM | POA: Diagnosis present

## 2018-05-14 DIAGNOSIS — J479 Bronchiectasis, uncomplicated: Secondary | ICD-10-CM | POA: Diagnosis not present

## 2018-05-14 DIAGNOSIS — J4530 Mild persistent asthma, uncomplicated: Secondary | ICD-10-CM

## 2018-05-14 DIAGNOSIS — L659 Nonscarring hair loss, unspecified: Secondary | ICD-10-CM | POA: Insufficient documentation

## 2018-05-14 MED ORDER — AMBULATORY NON FORMULARY MEDICATION: 0 refills | Status: DC

## 2018-05-14 NOTE — Progress Notes (Signed)
PULMONARY OFFICE FOLLOW-UP NOTE  Requesting MD/Service: Self Date of initial consultation: 11/14/17 Reason for consultation: SOB, cough  PT PROFILE: 78 y.o. female with very minimal smoking history (1 cigarette daily for 20 years, quit 2000) with frequent bronchitis as adult but no other pulmonary diagnoses  DATA: 12/19/17 PFTs: FVC: 3.05 L (115 %pred), FEV1: 1.81 L (89 %pred), FEV1/FVC: 60%, TLC: 4.64 L (93 %pred), DLCO 82 %pred 01/05/18 coronary CTA: incidental finding of ill-defined nodular opacity in RLL 05/08/18 CT chest: cluster of multiple small pulmonary nodules in area of chronic appearing inflammatory changes in posterior segment of RUL. Minimal to mild bronchiectatic areas in RML and ligula.  The previously identified RLL opacity has resolved   INTERVAL: Last visit 12/22/17. No major pulmonary events   SUBJ:  This is a scheduled follow-up.  Since last visit, she underwent coronary CT angiogram with an incidental finding of an ill-defined nodular opacity in the right lower lobe.  This was followed up with a repeat CT scan of the chest 05/08/2018 with findings as above.  She returns today with a report of increased, rattling cough with mild mucus production.  Over the past couple of days she has been using albuterol inhaler a couple of times per week.  Because of the CT scan findings, I elicited the following history.  She was born and raised in [place].  She did attend college in [place].  She has no known tuberculosis exposures.  She has never tested positive for tuberculosis by skin test.  She denies fever, hemoptysis, pleuritic chest pain, unexplained weight loss.   Vitals:   05/14/18 1003 05/14/18 1008  BP:  118/70  Pulse:  72  Resp: 16   SpO2:  98%  Weight: 149 lb (67.6 kg)   Height: 5\' 3"  (1.6 m)   RA  EXAM:  Gen: NAD HEENT: NCAT, sclera white Neck: No JVD Lungs: breath sounds full, no wheezes, few scattered rhonchi Cardiovascular: RRR, no  murmurs Abdomen: Soft, nontender, normal BS Ext: without clubbing, cyanosis, edema Neuro: grossly intact Skin: Limited exam, no lesions noted    DATA:   BMP Latest Ref Rng & Units 01/05/2018 10/26/2017 04/29/2016  Glucose 70 - 99 mg/dL - 127(H) 104(H)  BUN 8 - 23 mg/dL - 12 15  Creatinine 0.44 - 1.00 mg/dL 0.70 0.74 0.79  Sodium 135 - 145 mmol/L - 135 136  Potassium 3.5 - 5.1 mmol/L - 3.4(L) 3.6  Chloride 98 - 111 mmol/L - 100 102  CO2 22 - 32 mmol/L - 26 27  Calcium 8.9 - 10.3 mg/dL - 8.9 9.0    CBC Latest Ref Rng & Units 10/26/2017 04/29/2016  WBC 3.6 - 11.0 K/uL 5.6 14.0(H)  Hemoglobin 12.0 - 16.0 g/dL 13.7 13.5  Hematocrit 35.0 - 47.0 % 38.5 38.0  Platelets 150 - 440 K/uL 193 134(L)    CXR:  NNF  I have personally reviewed all chest radiographs reported above including CXRs and CT chest unless otherwise indicated  IMPRESSION:     ICD-10-CM   1. Multiple pulmonary nodules R91.8   2. 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 Very mild bronchiectasis J47.9 AMBULATORY NON FORMULARY MEDICATION  4. Mild persistent asthma without complication I77.82   5. Abnormal findings on diagnostic imaging of lung R91.8 CT CHEST WO CONTRAST   We reviewed the findings on CT scan in detail.  I emphasized that these nodules almost certainly do not represent malignancy and are much more likely to represent  old granulomatous lung disease.  She does also have some small areas of bronchiectasis which likely accounts for her recurrent symptoms of "bronchitis".  Her most troubling symptom is mucus production with difficulty mobilizing and expectorating the mucus.   PLAN:  Blood test today: QuantiFERON gold Flutter valve provided.  Use as needed to facilitate mucus clearance Continue AirDuo inhaler Continue albuterol as needed for increased shortness of breath, wheezing, cough, chest tightness Follow-up in 6 months with repeat CT chest prior to that visit.  She is to  call sooner as needed.   Merton Border, MD PCCM service Mobile [phone] Pager [phone] 05/15/2018 12:58 PM

## 2018-05-14 NOTE — Patient Instructions (Signed)
Blood test today: QuantiFERON gold Flutter valve provided.  Use as needed to facilitate mucus clearance Continue AirDuo inhaler Continue albuterol as needed for increased shortness of breath, wheezing, cough, chest tightness Follow-up in 6 months with repeat CT chest prior to that visit

## 2018-05-15 ENCOUNTER — Encounter: Payer: Self-pay | Admitting: Pulmonary Disease

## 2018-05-15 ENCOUNTER — Other Ambulatory Visit: Payer: Self-pay | Admitting: Pulmonary Disease

## 2018-05-15 MED ORDER — ALBUTEROL SULFATE HFA 108 (90 BASE) MCG/ACT IN AERS: 2.0000 | INHALATION_SPRAY | Freq: Four times a day (QID) | RESPIRATORY_TRACT | 0 refills | Status: DC | PRN

## 2018-05-15 NOTE — Telephone Encounter (Signed)
Call made to patient, confirmed pharmacy, made aware refill has been sent it. Voiced understanding. Nothing further is needed at this time.

## 2018-05-17 LAB — QUANTIFERON-TB GOLD PLUS (RQFGPL)
QUANTIFERON NIL VALUE: 0.02 [IU]/mL
QUANTIFERON TB1 AG VALUE: 0.03 [IU]/mL
QUANTIFERON TB2 AG VALUE: 0.03 [IU]/mL
QuantiFERON Mitogen Value: 7.32 IU/mL

## 2018-05-17 LAB — QUANTIFERON-TB GOLD PLUS: QuantiFERON-TB Gold Plus: NEGATIVE

## 2018-08-07 ENCOUNTER — Other Ambulatory Visit: Payer: Self-pay | Admitting: Cardiovascular Disease

## 2018-09-07 ENCOUNTER — Other Ambulatory Visit: Payer: Self-pay

## 2018-09-07 ENCOUNTER — Ambulatory Visit
Admission: RE | Admit: 2018-09-07 | Discharge: 2018-09-07 | Disposition: A | Discharge status: Home / Self Care | Payer: Medicare Other | Source: Ambulatory Visit | Attending: Pulmonary Disease | Admitting: Pulmonary Disease

## 2018-09-07 ENCOUNTER — Ambulatory Visit
Admission: RE | Admit: 2018-09-07 | Discharge: 2018-09-07 | Disposition: A | Discharge status: Home / Self Care | Payer: Medicare Other | Source: Ambulatory Visit | Attending: Internal Medicine | Admitting: Internal Medicine

## 2018-09-07 DIAGNOSIS — Z1231 Encounter for screening mammogram for malignant neoplasm of breast: Secondary | ICD-10-CM | POA: Insufficient documentation

## 2018-09-07 DIAGNOSIS — R918 Other nonspecific abnormal finding of lung field: Secondary | ICD-10-CM

## 2018-09-07 DIAGNOSIS — I2510 Atherosclerotic heart disease of native coronary artery without angina pectoris: Secondary | ICD-10-CM | POA: Insufficient documentation

## 2018-09-10 ENCOUNTER — Other Ambulatory Visit: Payer: Self-pay | Admitting: Internal Medicine

## 2018-09-10 ENCOUNTER — Encounter: Payer: Self-pay | Admitting: Pulmonary Disease

## 2018-09-10 ENCOUNTER — Ambulatory Visit (INDEPENDENT_AMBULATORY_CARE_PROVIDER_SITE_OTHER): Payer: Medicare Other | Admitting: Pulmonary Disease

## 2018-09-10 DIAGNOSIS — J4520 Mild intermittent asthma, uncomplicated: Secondary | ICD-10-CM | POA: Diagnosis not present

## 2018-09-10 DIAGNOSIS — J8410 Pulmonary fibrosis, unspecified: Secondary | ICD-10-CM

## 2018-09-10 DIAGNOSIS — N6310 Unspecified lump in the right breast, unspecified quadrant: Secondary | ICD-10-CM

## 2018-09-10 DIAGNOSIS — R928 Other abnormal and inconclusive findings on diagnostic imaging of breast: Secondary | ICD-10-CM

## 2018-09-10 NOTE — Patient Instructions (Signed)
Overall, your CT chest looks stable to improved.  No further follow-up of these findings is necessary.  Continue albuterol inhaler as needed for increased shortness of breath, wheezing, chest tightness, cough  If you find that you are using albuterol more than 4 times per week, resume AirDuo inhaler as previously prescribed.  If you initiate the AirDuo inhaler I would recommend that you remain on it for at least one week for trying to stop it again  Follow-up in this office as needed for any respiratory, pulmonary or chest symptoms

## 2018-09-10 NOTE — Progress Notes (Signed)
PULMONARY OFFICE FOLLOW-UP NOTE  Requesting MD/Service: Self Date of initial consultation: 11/14/17 Reason for consultation: SOB, cough  PT PROFILE: 78 y.o. female with very minimal smoking history (1 cigarette daily for 20 years, quit 2000) with frequent bronchitis as adult but no other pulmonary diagnoses  DATA: 12/19/17 PFTs: FVC: 3.05 L (115 %pred), FEV1: 1.81 L (89 %pred), FEV1/FVC: 60%, TLC: 4.64 L (93 %pred), DLCO 82 %pred 01/05/18 coronary CTA: incidental finding of ill-defined nodular opacity in RLL 05/08/18 CT chest: cluster of multiple small pulmonary nodules in area of chronic appearing inflammatory changes in posterior segment of RUL. Minimal to mild bronchiectatic areas in RML and ligula.  The previously identified RLL opacity has resolved 05/14/18: Quant gold negative 09/07/18 CT chest: There is bandlike scarring of the right middle lobe and lingula with clustered nodularity of the right upper lobe. Previously noted clustered nodules of the posterior right upper lobe in this vicinity are almost completely resolved. 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 No new nodules are noted.   INTERVAL: Last visit 05/14/18. 2 negative COVID-19 tests in interim.   Virtual Visit via Telephone Note I connected with Alison Bradshaw on 09/10/18 at  1:30 PM EDT by telephone and verified that I am speaking with the correct person using two identifiers. I discussed the limitations, risks, security and privacy concerns of performing an evaluation and management service by telephone and the availability of in person appointments. I also discussed with the patient that there may be a patient responsible charge related to this service. The patient expressed understanding and agreed to proceed. I discussed the assessment and treatment plan with the patient. The patient was provided an opportunity to ask questions and all were answered. The  patient agreed with the plan and demonstrated an understanding of the instructions. The patient was advised to call back or seek an in-person evaluation if the symptoms worsen or if the condition fails to improve as anticipated.   SUBJ:  This is a scheduled follow-up to review repeat CT chest. Asthma is generally well controlled. Not using AirDuo inhaler at all.  She requires albuterol inhaler infrequently, not more than once a week.  In general she has no ongoing respiratory symptoms and denies significant exertional dyspnea, chest tightness, wheezing, cough.  In addition, she denies fever, hemoptysis, pleuritic chest pain, unexplained weight loss.   There were no vitals filed for this visit.  EXAM:  Due to the remote nature of this encounter (telephone visit), no physical exam could be performed    DATA:   BMP Latest Ref Rng & Units 01/05/2018 10/26/2017 04/29/2016  Glucose 70 - 99 mg/dL - 127(H) 104(H)  BUN 8 - 23 mg/dL - 12 15  Creatinine 0.44 - 1.00 mg/dL 0.70 0.74 0.79  Sodium 135 - 145 mmol/L - 135 136  Potassium 3.5 - 5.1 mmol/L - 3.4(L) 3.6  Chloride 98 - 111 mmol/L - 100 102  CO2 22 - 32 mmol/L - 26 27  Calcium 8.9 - 10.3 mg/dL - 8.9 9.0    CBC Latest Ref Rng & Units 10/26/2017 04/29/2016  WBC 3.6 - 11.0 K/uL 5.6 14.0(H)  Hemoglobin 12.0 - 16.0 g/dL 13.7 13.5  Hematocrit 35.0 - 47.0 % 38.5 38.0  Platelets 150 - 440 K/uL 193 134(L)    CXR:  No new film  I have personally reviewed all chest radiographs reported above including CXRs and CT chest unless otherwise indicated  IMPRESSION:     ICD-10-CM  1. 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 Does not appear to be progressive J84.10   2. 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  I discussed these findings with her.  Presently, she has minimal  symptoms of mild intermittent asthma and is on no controller therapy at this time.   PLAN:  We reviewed findings on most recent CT scan of the chest. No further radiographic surveillance is warranted at this time Continue albuterol inhaler as needed If she requires albuterol more than 4 times per week, I have instructed that she resume ICS/LABA inhaler Follow-up in this office as needed for any respiratory, pulmonary or chest symptoms   Merton Border, MD PCCM service Mobile [phone] Pager [phone] 09/10/2018 1:46 PM

## 2018-09-15 ENCOUNTER — Other Ambulatory Visit: Payer: Self-pay

## 2018-09-15 ENCOUNTER — Ambulatory Visit
Admission: RE | Admit: 2018-09-15 | Discharge: 2018-09-15 | Disposition: A | Discharge status: Home / Self Care | Payer: Medicare Other | Source: Ambulatory Visit | Attending: Internal Medicine | Admitting: Internal Medicine

## 2018-09-15 DIAGNOSIS — R928 Other abnormal and inconclusive findings on diagnostic imaging of breast: Secondary | ICD-10-CM | POA: Insufficient documentation

## 2018-09-15 DIAGNOSIS — N6310 Unspecified lump in the right breast, unspecified quadrant: Secondary | ICD-10-CM

## 2018-10-06 ENCOUNTER — Telehealth: Payer: Self-pay | Admitting: Cardiovascular Disease

## 2018-10-06 NOTE — Telephone Encounter (Signed)
-----   Message from [place], [place] sent at 10/01/2018  4:42 PM EDT ----- Pt has upcoming appointment w/Dr.Arida 10/08/2018 @3 :00. Pt was told to follow up prn on last OV.  Pt was last 12/19 I'm not sure if pt needs to come in or not considering she is prn and normally would f/u for issues and per yr f/u for refills.  Please advise if pt needs to cancel.

## 2018-10-06 NOTE — Telephone Encounter (Signed)
LMOV to determine what appointment is for If no issues can be cancelled

## 2018-10-07 ENCOUNTER — Telehealth: Payer: Self-pay | Admitting: Cardiovascular Disease

## 2018-10-07 NOTE — Telephone Encounter (Signed)

## 2018-10-08 ENCOUNTER — Ambulatory Visit (INDEPENDENT_AMBULATORY_CARE_PROVIDER_SITE_OTHER): Payer: Medicare Other | Admitting: Nurse Practitioner

## 2018-10-08 ENCOUNTER — Other Ambulatory Visit: Payer: Self-pay

## 2018-10-08 ENCOUNTER — Encounter: Payer: Self-pay | Admitting: Nurse Practitioner

## 2018-10-08 VITALS — BP 100/60 | HR 69 | Ht 63.0 in | Wt 151.0 lb

## 2018-10-08 DIAGNOSIS — I2510 Atherosclerotic heart disease of native coronary artery without angina pectoris: Secondary | ICD-10-CM | POA: Diagnosis not present

## 2018-10-08 DIAGNOSIS — E782 Mixed hyperlipidemia: Secondary | ICD-10-CM | POA: Diagnosis not present

## 2018-10-08 NOTE — Patient Instructions (Signed)
Medication Instructions:  Your physician recommends that you continue on your current medications as directed. Please refer to the Current Medication list given to you today.  If you need a refill on your cardiac medications before your next appointment, please call your pharmacy.   Lab work: None ordered  If you have labs (blood work) drawn today and your tests are completely normal, you will receive your results only by: Marland Kitchen MyChart Message (if you have MyChart) OR . A paper copy in the mail If you have any lab test that is abnormal or we need to change your treatment, we will call you to review the results.  Testing/Procedures: None ordered   Follow-Up: At [facility], you and your health needs are our priority.  As part of our continuing mission to provide you with exceptional heart care, we have created designated Provider Care Teams.  These Care Teams include your primary Cardiologist (physician) and Advanced Practice Providers (APPs -  Physician Assistants and Nurse Practitioners) who all work together to provide you with the care you need, when you need it. You will need a follow up appointment PRN.

## 2018-10-08 NOTE — Telephone Encounter (Signed)
Patient returned voicemail and voiced concerns she would like to be seen for

## 2018-10-08 NOTE — Progress Notes (Signed)
Office Visit    Patient Name: Alison Bradshaw Date of Encounter: 10/08/2018  Primary Care Provider:  Baxter Hire, MD Primary Cardiologist:  Kathlyn Sacramento, MD  Chief Complaint    78 year old female with a history of atypical chest pain, nonobs CAD, GERD, hyperlipidemia, pulm nodule, and remote tob abuse, who presents for follow-up of chest pain.  Past Medical History    Past Medical History:  Diagnosis Date   Anxiety    Asthma    Atypical chest pain    Chronic gastritis    Duodenal ulcer    GERD (gastroesophageal reflux disease)    Hyperlipidemia    Non-obstructive CAD (coronary artery disease)    a. 10/2017 Stress Echo: Nl EF, no ischemia. Mild MR; b. 12/2017 Cardiac CTA: LM nl, LAD 50p, 30d, D1/2 nl, RI nl, LCX nl, OM1/2 nl, RCA nl, RPDA/RPL nl. Ca2+ score = 39 (46th percentile)-->statin dose escalated.   Osteopenia    PSVT (paroxysmal supraventricular tachycardia) ([place])    a. 10/2017 Holter: PSVT, max 5 beats->did not tolerate beta blocker.   Pulmonary nodules    a. 12/2017 RLL 2mm nodule noted on Cardiac CTA; b. 04/2018 RLL 66mm nodule resolved; c. 09/2018 CT Chest: Previously noted posterior RUL nodules almost completely resolved. Bandlike scarring of RML and lingula-->consistent w/ improved atypical infxt.   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   Past Surgical History:  Procedure Laterality Date   BREAST BIOPSY Left 2008   neg   BREAST EXCISIONAL BIOPSY Left 2004   neg   BREAST SURGERY     CATARACT EXTRACTION W/ INTRAOCULAR LENS  IMPLANT, BILATERAL Bilateral    COLONOSCOPY WITH PROPOFOL N/A 01/22/2017   Procedure: COLONOSCOPY WITH PROPOFOL;  Surgeon: Toledo, Benay Pike, MD;  Location: [facility] ENDOSCOPY;  Service: Gastroenterology;  Laterality: N/A;   ESOPHAGOGASTRODUODENOSCOPY (EGD) WITH PROPOFOL N/A 01/20/2015   Procedure: ESOPHAGOGASTRODUODENOSCOPY (EGD) WITH PROPOFOL;  Surgeon: Lollie Sails, MD;   Location: [facility] ENDOSCOPY;  Service: Endoscopy;  Laterality: N/A;   ESOPHAGOGASTRODUODENOSCOPY (EGD) WITH PROPOFOL N/A 01/22/2017   Procedure: ESOPHAGOGASTRODUODENOSCOPY (EGD) WITH PROPOFOL;  Surgeon: Toledo, Benay Pike, MD;  Location: [facility] ENDOSCOPY;  Service: Gastroenterology;  Laterality: N/A;   ESOPHAGOGASTRODUODENOSCOPY (EGD) WITH PROPOFOL N/A 04/14/2018   Procedure: ESOPHAGOGASTRODUODENOSCOPY (EGD) WITH PROPOFOL;  Surgeon: Lollie Sails, MD;  Location: [facility] ENDOSCOPY;  Service: Endoscopy;  Laterality: N/A;   EYE SURGERY     OPEN REDUCTION INTERNAL FIXATION (ORIF) DISTAL RADIAL FRACTURE Right 02/09/2018   Procedure: OPEN REDUCTION INTERNAL FIXATION (ORIF) DISTAL RADIAL FRACTURE;  Surgeon: Hessie Knows, MD;  Location: [facility] ORS;  Service: Orthopedics;  Laterality: Right;   TONSILLECTOMY      Allergies  Allergies  Allergen Reactions   Percocet [Oxycodone-Acetaminophen] Nausea Only   Ceftin [Cefuroxime Axetil] Hives and Rash   Timentin [Ticarcillin-Pot Clavulanate] Hives and Rash    History of Present Illness    78 year old female with a history of atypical chest pain, nonobstructive CAD, GERD, hyperlipidemia, pulmonary nodule, and remote tobacco abuse.  She was previously evaluated for atypical chest pain and dyspnea on exertion in July 2019 with stress echocardiogram, which was normal and without ischemia.  Holter monitoring at that time showed short runs of SVT with the longest being 5 bpm.  She was placed on beta-blocker however did not tolerate it due to dizziness.  She subsequently established care with Dr. Fletcher Anon in September 2019 and continued to have chest discomfort and dyspnea.  Coronary CT  angiography was performed and showed moderate nonobstructive LAD disease with otherwise normal coronary arteries and a calcium score of 39, placing her in the 46 percentile.  Following this finding, her atorvastatin dose was titrated to 40 mg daily.  Since her last visit, she has been  stable.  She continues to have occasional dyspnea on exertion and also sometimes a sense that she cannot get 1 good breath.  Occasionally, while lying in bed, she will have a pressure-like feeling in her epigastric area that will move into her sternal area and resolve within about 30 minutes.  It is not typically associated with other symptoms and is not particularly severe.  This is the same symptom that she has been having for the past 2 years, which had previously prompted ischemic testing.  She does not notice this with her usual activities and she is fairly active at home.  She denies palpitations, PND, orthopnea, dizziness, syncope, edema, or early satiety.  Home Medications    Prior to Admission medications   Medication Sig Start Date End Date Taking? Authorizing Provider  acetaminophen (TYLENOL) 500 MG tablet Take 1,000 mg by mouth every 6 (six) hours as needed.   Yes [provider]  albuterol (PROVENTIL HFA;VENTOLIN HFA) 108 (90 Base) MCG/ACT inhaler Inhale 2 puffs into the lungs every 6 (six) hours as needed for shortness of breath. 05/15/18  Yes Wilhelmina Mcardle, MD  ALPRAZolam Duanne Moron) 0.25 MG tablet Take 0.25 mg by mouth 2 (two) times daily as needed for anxiety.   Yes [provider]  AMBULATORY NON FORMULARY MEDICATION Medication Name: flutter valve use 3 times daily. 05/14/18  Yes Wilhelmina Mcardle, MD  aspirin EC 81 MG tablet Take 1 tablet (81 mg total) by mouth daily. 01/08/18  Yes Wellington Hampshire, MD  atorvastatin (LIPITOR) 40 MG tablet TAKE 1 TABLET(40 MG) BY MOUTH DAILY 08/07/18  Yes Arida, Muhammad A, MD  fluticasone (FLONASE SENSIMIST) 27.5 MCG/SPRAY nasal spray Place 2 sprays into the nose daily. 12/22/17  Yes Wilhelmina Mcardle, MD  Fluticasone-Salmeterol (AIRDUO RESPICLICK 440/10) 272-53 MCG/ACT AEPB Inhale 1 puff into the lungs 2 (two) times daily. 11/14/17  Yes Wilhelmina Mcardle, MD  ondansetron (ZOFRAN) 4 MG tablet Take 1 tablet (4 mg total) by mouth daily as needed  for nausea or vomiting. 02/08/18 02/08/19 Yes Eula Listen, MD  pantoprazole (PROTONIX) 40 MG tablet Take 40 mg by mouth daily.   Yes [provider]  sucralfate (CARAFATE) 1 G tablet Take 1 g by mouth 4 (four) times daily -  with meals and at bedtime. Pt only takes when she's really needs it which isn't very often   Yes [provider]    Review of Systems    Occasional dyspnea on exertion which has been chronic and stable.  Occasional epigastric discomfort.  She denies palpitations, PND, orthopnea, dizziness, syncope, edema, or early satiety all other systems reviewed and are otherwise negative except as noted above.  Physical Exam    VS:  BP 100/60 (BP Location: Left Arm, Patient Position: Sitting, Cuff Size: Normal)    Pulse 69    Ht 5\' 3"  (1.6 m)    Wt 151 lb (68.5 kg)    BMI 26.75 kg/m  , BMI Body mass index is 26.75 kg/m. GEN: Well nourished, well developed, in no acute distress. HEENT: normal. Neck: Supple, no JVD, carotid bruits, or masses. Cardiac: RRR, no murmurs, rubs, or gallops. No clubbing, cyanosis, edema.  Radials/DP/PT 2+ and equal bilaterally.  Respiratory:  Respirations regular and unlabored, clear to auscultation bilaterally. GI: Soft, nontender, nondistended, BS + x 4. MS: no deformity or atrophy. Skin: warm and dry, no rash. Neuro:  Strength and sensation are intact. Psych: Normal affect.  Accessory Clinical Findings    ECG personally reviewed by me today -  Regular sinus rhythm, 69 - no acute changes.  Lab Results  Component Value Date   WBC 5.6 10/26/2017   HGB 13.7 10/26/2017   HCT 38.5 10/26/2017   MCV 86.4 10/26/2017   PLT 193 10/26/2017   Lab Results  Component Value Date   CREATININE 0.70 01/05/2018   BUN 12 10/26/2017   NA 135 10/26/2017   K 3.4 (L) 10/26/2017   CL 100 10/26/2017   CO2 26 10/26/2017   Lab Results  Component Value Date   ALT 14 04/29/2016   AST 28 04/29/2016   ALKPHOS 73 04/29/2016   BILITOT 0.8  04/29/2016     Assessment & Plan    1.  Atypical chest pain and nonobstructive CAD: Patient has a 2-year history of atypical chest discomfort and epigastric discomfort..  She had a negative stress echo last summer followed by cardiac CT angiography in September which showed moderate LAD disease and otherwise normal coronary arteries.  She has had stable epigastric symptoms and occasional dyspnea on exertion over the past 9 months.  She remains on statin therapy.  She has been prescribed aspirin but has a history of GI upset with this.  She will try it again.  No further ischemic testing is warranted at this time.  She will follow-up as needed.  2.  Hyperlipidemia: On statin therapy and followed by primary care.  We had a long discussion today re: the value of statin rx in secondary prevention.  She was wondering if she needed to remain on statin therapy and I advised that given Cardiac CT findings, statin therapy is a mainstay of treatment.  3.  Pulmonary nodules: Incidentally noted on CT angiography of the chest previously.  Being followed by pulmonology with recent CT showing improvement.  4.  Disposition: Follow-up in clinic as needed.   Murray Hodgkins, NP 10/08/2018, 4:13 PM

## 2018-11-05 ENCOUNTER — Other Ambulatory Visit: Payer: Self-pay | Admitting: Cardiovascular Disease

## 2019-01-20 ENCOUNTER — Other Ambulatory Visit: Payer: Self-pay | Admitting: Gastroenterology

## 2019-01-20 DIAGNOSIS — R911 Solitary pulmonary nodule: Secondary | ICD-10-CM

## 2019-03-11 ENCOUNTER — Encounter: Payer: Self-pay | Admitting: Internal Medicine

## 2019-03-11 ENCOUNTER — Telehealth: Payer: Self-pay | Admitting: Internal Medicine

## 2019-03-11 NOTE — Telephone Encounter (Signed)
Happy to see her  I suggest she schedule an appointment so we can meet and establish care

## 2019-03-11 NOTE — Telephone Encounter (Signed)
Dr. Carlean Purl, Alison Bradshaw is established with Dr. Gustavo Lah at [facility] but would like to transfer care to [facility] because Dr. Gustavo Lah is retiring.  You were highly recommended to her by several friends.   She reported dysphagia, burning sensation and occasional diarrhea.  She stated that she has been diagnosed with Barrett's esophagus.  GI records are available in [facility] for review.    Please review and advise scheduling.

## 2019-03-16 ENCOUNTER — Other Ambulatory Visit (INDEPENDENT_AMBULATORY_CARE_PROVIDER_SITE_OTHER): Payer: Medicare Other

## 2019-03-16 ENCOUNTER — Encounter: Payer: Self-pay | Admitting: Nurse Practitioner

## 2019-03-16 ENCOUNTER — Other Ambulatory Visit: Payer: Self-pay

## 2019-03-16 ENCOUNTER — Ambulatory Visit: Payer: Medicare Other | Admitting: Nurse Practitioner

## 2019-03-16 VITALS — BP 124/70 | HR 60 | Temp 97.2°F | Ht 63.0 in | Wt 146.5 lb

## 2019-03-16 DIAGNOSIS — R1013 Epigastric pain: Secondary | ICD-10-CM

## 2019-03-16 DIAGNOSIS — R197 Diarrhea, unspecified: Secondary | ICD-10-CM

## 2019-03-16 DIAGNOSIS — K2270 Barrett's esophagus without dysplasia: Secondary | ICD-10-CM

## 2019-03-16 DIAGNOSIS — R194 Change in bowel habit: Secondary | ICD-10-CM

## 2019-03-16 LAB — CBC WITH DIFFERENTIAL/PLATELET
Basophils Absolute: 0.1 10*3/uL (ref 0.0–0.1)
Basophils Relative: 1.2 % (ref 0.0–3.0)
Eosinophils Absolute: 0.1 10*3/uL (ref 0.0–0.7)
Eosinophils Relative: 2 % (ref 0.0–5.0)
HCT: 40.9 % (ref 36.0–46.0)
Hemoglobin: 14 g/dL (ref 12.0–15.0)
Lymphocytes Relative: 24.2 % (ref 12.0–46.0)
Lymphs Abs: 1.3 10*3/uL (ref 0.7–4.0)
MCHC: 34.2 g/dL (ref 30.0–36.0)
MCV: 87.6 fl (ref 78.0–100.0)
Monocytes Absolute: 0.8 10*3/uL (ref 0.1–1.0)
Monocytes Relative: 14 % — ABNORMAL HIGH (ref 3.0–12.0)
Neutro Abs: 3.3 10*3/uL (ref 1.4–7.7)
Neutrophils Relative %: 58.6 % (ref 43.0–77.0)
Platelets: 197 10*3/uL (ref 150.0–400.0)
RBC: 4.67 Mil/uL (ref 3.87–5.11)
RDW: 13.6 % (ref 11.5–15.5)
WBC: 5.6 10*3/uL (ref 4.0–10.5)

## 2019-03-16 LAB — COMPREHENSIVE METABOLIC PANEL
ALT: 13 U/L (ref 0–35)
AST: 24 U/L (ref 0–37)
Albumin: 4.5 g/dL (ref 3.5–5.2)
Alkaline Phosphatase: 77 U/L (ref 39–117)
BUN: 13 mg/dL (ref 6–23)
CO2: 30 mEq/L (ref 19–32)
Calcium: 10 mg/dL (ref 8.4–10.5)
Chloride: 101 mEq/L (ref 96–112)
Creatinine, Ser: 1.1 mg/dL (ref 0.40–1.20)
GFR: 48.23 mL/min — ABNORMAL LOW (ref >60.00)
Glucose, Bld: 93 mg/dL (ref 70–99)
Potassium: 3.9 mEq/L (ref 3.5–5.1)
Sodium: 139 mEq/L (ref 135–145)
Total Bilirubin: 0.6 mg/dL (ref 0.2–1.2)
Total Protein: 7.3 g/dL (ref 6.0–8.3)

## 2019-03-16 LAB — C-REACTIVE PROTEIN: CRP: <1 mg/dL (ref 0.5–20.0)

## 2019-03-16 LAB — LIPASE: Lipase: 31 U/L (ref 11.0–59.0)

## 2019-03-16 NOTE — Progress Notes (Signed)
03/16/2019 Alison Bradshaw [id number] August 04, 1940   HISTORY OF PRESENT ILLNESS: Alison Bradshaw is a 78 year old female with a past medical history of anxiety, asthma, CAD, SVT, pulmonary nodules, GERD and Barrett's esophagus.  He presents today to establish her care with Dr. Carlean Purl as her prior gastroenterologist, Dr. Gustavo Lah, retired.  She complains of having a change in bowel pattern over the past 2 months, more gassy, increased urgency with passing small amounts of stool. She also had upper abdominal pain. "Felt like a ball is in my stomach".  She was concerned she had a bowel blockage.  She went to an urgent care center 2 days ago without significant findings.  Laboratory studies and abdominal imaging was not done.  She went on a soft diet and her symptoms have significantly improved.. She complains of having looser stools for the past few weeks.  Sometimes passes small bits of stool with liquid.  Stools do not float.  No oily discharge from the rectum.  She reports feeling much better today.  She passed a formed stool yesterday.  She previously passed a normal formed stools daily.  No rectal bleeding or melena.  No mucus per the rectum.  She last took an antibiotic approximately 5 months ago, she cannot recall for what reason or which antibiotic was taken.  She is taking a Phillips bacteria probiotic once daily.  She has lost approximately 5 pounds over the past month.  He underwent a colonoscopy 01/22/2017 which showed internal and external hemorrhoids, no polyps.  Family history of esophageal, stomach or colorectal cancer.  Paternal uncle had pancreatic cancer.  EGD 04/14/2018 at [facility] by Dr. Loistine Simas - Small hiatal hernia. - Z-line regular. - Gastritis.  Biopsies were negative for H. pylori.  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 -Biopsies of the gastroesophageal junction identified columnar mucosa with intestinal metaplasia,  mild reflux without evidence of dysplasia  or malignancy - Normal examined duodenum   EGD 01/22/2017:  - Zenker's diverticulum. - Normal mucosa was found in the entire esophagus. - Multiple gastric polyps. - Gastritis. Biopsied. - Normal examined duodenum.  EGD 01/20/2015: - LA Grade A erosive esophagitis. Biopsied. - Zenker's diverticulum. - Multiple gastric polyps. Biopsied. - Bile gastritis. Biopsied. - Mucosal variant in the duodenum. Biopsied  Colonoscopy 01/22/2017 at [facility]: - Diverticulosis in the sigmoid colon. - Non-bleeding internal hemorrhoids. - The examination was otherwise normal. - No specimens collected. - No further colonoscopies recommended due to age   Past Medical History:  Diagnosis Date  . Anxiety   . Asthma   . Atypical chest pain   . Chronic gastritis   . Duodenal ulcer   . GERD (gastroesophageal reflux disease)   . Hyperlipidemia   . Non-obstructive CAD (coronary artery disease)    a. 10/2017 Stress Echo: Nl EF, no ischemia. Mild MR; b. 12/2017 Cardiac CTA: LM nl, LAD 50p, 30d, D1/2 nl, RI nl, LCX nl, OM1/2 nl, RCA nl, RPDA/RPL nl. Ca2+ score = 39 (46th percentile)-->statin dose escalated.  . Osteopenia   . PSVT (paroxysmal supraventricular tachycardia) ([place])    a. 10/2017 Holter: PSVT, max 5 beats->did not tolerate beta blocker.  . Pulmonary nodules    a. 12/2017 RLL 28mm nodule noted on Cardiac CTA; b. 04/2018 RLL 35mm nodule resolved; c. 09/2018 CT Chest: Previously noted posterior RUL nodules almost completely resolved. Bandlike scarring of RML and lingula-->consistent w/ improved atypical infxt.  . Rosacea   . Senile nuclear sclerosis   .  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 BREAST BIOPSY Left 2008   neg  . BREAST EXCISIONAL BIOPSY Left 2004   neg  . BREAST SURGERY    . CATARACT EXTRACTION W/ INTRAOCULAR LENS  IMPLANT, BILATERAL Bilateral   . COLONOSCOPY WITH PROPOFOL N/A 01/22/2017   Procedure: COLONOSCOPY WITH PROPOFOL;  Surgeon:  Toledo, Benay Pike, MD;  Location: [facility] ENDOSCOPY;  Service: Gastroenterology;  Laterality: N/A;  . ESOPHAGOGASTRODUODENOSCOPY (EGD) WITH PROPOFOL N/A 01/20/2015   Procedure: ESOPHAGOGASTRODUODENOSCOPY (EGD) WITH PROPOFOL;  Surgeon: Lollie Sails, MD;  Location: [facility] ENDOSCOPY;  Service: Endoscopy;  Laterality: N/A;  . ESOPHAGOGASTRODUODENOSCOPY (EGD) WITH PROPOFOL N/A 01/22/2017   Procedure: ESOPHAGOGASTRODUODENOSCOPY (EGD) WITH PROPOFOL;  Surgeon: Toledo, Benay Pike, MD;  Location: [facility] ENDOSCOPY;  Service: Gastroenterology;  Laterality: N/A;  . ESOPHAGOGASTRODUODENOSCOPY (EGD) WITH PROPOFOL N/A 04/14/2018   Procedure: ESOPHAGOGASTRODUODENOSCOPY (EGD) WITH PROPOFOL;  Surgeon: Lollie Sails, MD;  Location: [facility] ENDOSCOPY;  Service: Endoscopy;  Laterality: N/A;  . EYE SURGERY    . OPEN REDUCTION INTERNAL FIXATION (ORIF) DISTAL RADIAL FRACTURE Right 02/09/2018   Procedure: OPEN REDUCTION INTERNAL FIXATION (ORIF) DISTAL RADIAL FRACTURE;  Surgeon: Hessie Knows, MD;  Location: [facility] ORS;  Service: Orthopedics;  Laterality: Right;  . TONSILLECTOMY      reports that she has never smoked. She has never used smokeless tobacco. She reports previous alcohol use of about 2.0 standard drinks of alcohol per week. She reports that she does not use drugs. family history includes AAA (abdominal aortic aneurysm) in her father; Alzheimer's disease in her mother; Emphysema in her father; Hyperlipidemia in her mother; Kidney cancer in her brother; Pancreatic cancer in her paternal uncle. Allergies  Allergen Reactions  . Percocet [Oxycodone-Acetaminophen] Nausea Only  . Ceftin [Cefuroxime Axetil] Hives and Rash  . Timentin [Ticarcillin-Pot Clavulanate] Hives and Rash      Outpatient Encounter Medications as of 03/16/2019  Medication Sig  . acetaminophen (TYLENOL) 500 MG tablet Take 1,000 mg by mouth every 6 (six) hours as needed.  Marland Kitchen albuterol (PROVENTIL HFA;VENTOLIN HFA) 108 (90 Base) MCG/ACT inhaler Inhale 2  puffs into the lungs every 6 (six) hours as needed for shortness of breath.  . ALPRAZolam (XANAX) 0.25 MG tablet Take 0.25 mg by mouth 2 (two) times daily as needed for anxiety.  . AMBULATORY NON FORMULARY MEDICATION Medication Name: flutter valve use 3 times daily.  Marland Kitchen aspirin EC 81 MG tablet Take 1 tablet (81 mg total) by mouth daily.  Marland Kitchen atorvastatin (LIPITOR) 40 MG tablet TAKE 1 TABLET(40 MG) BY MOUTH DAILY  . fluticasone (FLONASE SENSIMIST) 27.5 MCG/SPRAY nasal spray Place 2 sprays into the nose daily.  . Fluticasone-Salmeterol (AIRDUO RESPICLICK 99991111) 0000000 MCG/ACT AEPB Inhale 1 puff into the lungs 2 (two) times daily.  . pantoprazole (PROTONIX) 40 MG tablet Take 40 mg by mouth daily.  . sucralfate (CARAFATE) 1 G tablet Take 1 g by mouth 4 (four) times daily -  with meals and at bedtime. Pt only takes when she's really needs it which isn't very often   No facility-administered encounter medications on file as of 03/16/2019.    Allergies  Allergen Reactions  . Percocet [Oxycodone-Acetaminophen] Nausea Only  . Ceftin [Cefuroxime Axetil] Hives and Rash  . Timentin [Ticarcillin-Pot Clavulanate] Hives and Rash    Family History  Problem Relation Age of Onset  . Hyperlipidemia Mother   . Alzheimer's disease Mother   . Emphysema Father   . AAA (abdominal aortic aneurysm) Father   .  Kidney Stones Father   . Kidney cancer Brother   . Pancreatic cancer Paternal Uncle   . Diabetes Paternal Uncle      REVIEW OF SYSTEMS  : All other systems reviewed and negative except where noted in the History of Present Illness.   PHYSICAL EXAM: BP 124/70 (BP Location: Left Arm, Patient Position: Sitting, Cuff Size: Normal)   Pulse 60   Temp (!) 97.2 F (36.2 C)   Ht 5\' 3"  (1.6 m)   Wt 146 lb 8 oz (66.5 kg)   BMI 25.95 kg/m   General: 78 year old  female in no acute distress Head: Normocephalic and atraumatic Eyes:  Sclerae anicteric,conjunctive pink. Ears: Normal auditory acuity Mouth:  Dentition intact, no ulcers or lesions Neck: Supple, no masses.  Lungs: Clear throughout to auscultation Heart: Regular rate and rhythm, no murmur Abdomen: Soft, epigastric tenderness without rebound or guarding. Non distended. No masses or hepatomegaly noted. Normal bowel sounds Rectal: Deferred Musculoskeletal: Symmetrical with no gross deformities  Skin: No lesions on visible extremities Extremities: No edema  Neurological: Alert oriented x 4, grossly nonfocal Cervical Nodes:  No significant cervical adenopathy Psychological:  Alert and cooperative. Normal mood and affect  ASSESSMENT AND PLAN:  2. 78 year old female with epigastric pain -Abdominal sono -CBC, CMP, CRP, Lipase  -Patient to call office if symptoms worsen   2. GERD with Barrett's esophagus without dysplasia  -Continue Pantoprazole 40mg  once daily  3. Diarrhea, improving -Patient to complete stool GI panel and pancreatic elastase level if diarrhea recurs -Florastor probiotic one capsule by mouth twice daily for 2 to 4 weeks -Celiac panel   Follow up with Dr. Carlean Purl as previously scheduled       CC:  Baxter Hire, MD

## 2019-03-16 NOTE — Patient Instructions (Addendum)
If you are age 76 or older, your body mass index should be between 23-30. Your Body mass index is 25.95 kg/m. If this is out of the aforementioned range listed, please consider follow up with your Primary Care Provider.  If you are age 52 or younger, your body mass index should be between 19-25. Your Body mass index is 25.95 kg/m. If this is out of the aformentioned range listed, please consider follow up with your Primary Care Provider.   Your provider has requested that you go to the basement level for lab work before leaving today. Press "B" on the elevator. The lab is located at the first door on the left as you exit the elevator.  Please purchase the following medications over the counter and take as directed: Florastor probiotic 1 capsule twice daily.   You have been scheduled for an abdominal ultrasound at [facility] on Friday March 19, 2019 at 9 am. Please arrive 15 minutes prior to your appointment for registration. Make certain not to have anything to eat or drink 6 hours prior to your appointment. Should you need to reschedule your appointment, please contact radiology at [phone]. This test typically takes about 30 minutes to perform.

## 2019-03-19 ENCOUNTER — Other Ambulatory Visit: Payer: Self-pay

## 2019-03-19 ENCOUNTER — Other Ambulatory Visit: Payer: Medicare Other

## 2019-03-19 ENCOUNTER — Ambulatory Visit
Admission: RE | Admit: 2019-03-19 | Discharge: 2019-03-19 | Disposition: A | Discharge status: Home / Self Care | Payer: Medicare Other | Source: Ambulatory Visit | Attending: Nurse Practitioner | Admitting: Nurse Practitioner

## 2019-03-19 DIAGNOSIS — R1013 Epigastric pain: Secondary | ICD-10-CM | POA: Diagnosis not present

## 2019-03-19 DIAGNOSIS — R197 Diarrhea, unspecified: Secondary | ICD-10-CM

## 2019-03-19 DIAGNOSIS — K2270 Barrett's esophagus without dysplasia: Secondary | ICD-10-CM

## 2019-03-19 DIAGNOSIS — R194 Change in bowel habit: Secondary | ICD-10-CM

## 2019-03-20 LAB — GLIADIN ANTIBODIES, SERUM
Gliadin IgA: 6 Units
Gliadin IgG: 2 Units

## 2019-03-20 LAB — RETICULIN ANTIBODIES, IGA W TITER: Reticulin IGA Screen: NEGATIVE

## 2019-03-20 LAB — TISSUE TRANSGLUTAMINASE, IGA: (tTG) Ab, IgA: 1 U/mL

## 2019-03-22 ENCOUNTER — Ambulatory Visit: Payer: Medicare Other | Admitting: Internal Medicine

## 2019-03-22 VITALS — BP 112/62 | HR 58 | Ht 63.0 in | Wt 146.6 lb

## 2019-03-22 DIAGNOSIS — K219 Gastro-esophageal reflux disease without esophagitis: Secondary | ICD-10-CM

## 2019-03-22 DIAGNOSIS — K580 Irritable bowel syndrome with diarrhea: Secondary | ICD-10-CM | POA: Diagnosis not present

## 2019-03-22 DIAGNOSIS — K30 Functional dyspepsia: Secondary | ICD-10-CM | POA: Diagnosis not present

## 2019-03-22 DIAGNOSIS — K225 Diverticulum of esophagus, acquired: Secondary | ICD-10-CM

## 2019-03-22 NOTE — Patient Instructions (Signed)
We are giving you both IBgard and FDgard to try. Take these before your meals and see if they help.   You have been given a testing kit to check for small intestine bacterial overgrowth (SIBO) which is completed by a company named [employer]. Make sure to return your test in the mail using the return mailing label given you along with the kit. Your demographic and insurance information have already been sent to the company and they should be in contact with you over the next week regarding this test. Please keep in mind that you will be getting a call from phone number [phone] or a similar number. If you do not hear from them within this time frame, please call our office at [phone].   DO NOT DO YOUR TEST KIT UNTIL YOUR LABS HAVE RESULTED.   We will determine your follow up after we get results.   I appreciate the opportunity to care for you. Silvano Rusk, MD, [facility]

## 2019-03-22 NOTE — Progress Notes (Signed)
Alison Bradshaw 78 y.o. [birth year] [id number]  Assessment & Plan:   Encounter Diagnoses  Name Primary?  . Irritable bowel syndrome with diarrhea Yes  . Functional dyspepsia   . Gastroesophageal reflux disease, unspecified whether esophagitis present   . Zenker's diverticulum - asymptomatic    It seems like she has irritable bowel syndrome with diarrhea and functional dyspepsia and probably some GERD.  She does not have Barrett's esophagus.  He have to have a 1 cm or greater columnar tongue to make that diagnosis in 2020.  In even if she did at her age I am not sure she needed follow-up on that.  She has an asymptomatic Zenker's diverticulum.  I do not think she has dysphagia as much as she has dyspepsia.  Since she notices improvement in lower GI symptoms on antibiotics I wonder if she might not have small intestinal bacterial overgrowth driving her IBS and perhaps some of her dyspepsia symptoms.  Her pancreatic elastase was normal and GI pathogen panel was normal.  We will go ahead and test for small intestinal bacterial overgrowth with a lactulose hydrogen breath test.  I gave her the kit today.  Once we see those results we will determine next step obviously if positive would treat.   Chronic PPI use is a risk factor for that and her.  Not sure if she could come off it based upon her history but consider dose reduction and then possibly weaning.   I appreciate the opportunity to care for this patient. CC: Baxter Hire, MD  Subjective:   Chief Complaint: dysphagia  HPI 78 year old white woman previously followed by Dr. Gustavo Bradshaw and seen by Alison Bradshaw Best due to epigastric pain on December 8.  She had been waiting for this appointment but experienced some acute epigastric pain and was worked in, complaining of a "ball in my stomach" sensation wondering if she had some sort of a bowel blockage.  She saw an urgent care where labs were normal but no imaging.  She  went on a soft diet and she got a lot better but she was complaining of looser stools over several weeks as well.  No oily or greasy stools.  She was improving when she saw Alison Bradshaw.  Colonoscopy in 18 demonstrated hemorrhoids no polyps.  EGD in January of this year small hiatal hernia with irregular Z-line and gastritis she had some fundic gland polyps no H. pylori.  The Z-line was irregular but there was intestinal metaplasia but no dysplasia and on it EGD in 2018 there was in Zenker's diverticulum.  She was given a diagnosis of Barrett's esophagus but she did not have a greater than 1 cm columnar tongue.  See that December 8 note for further details.   Main issue now is the sensation of food sitting like a ball in her stomach and bowel changes.  She has episodic symptoms.  She has been taking Florastor recently at the recommendation of Alison Bradshaw after being on a [employer].  She occasionally vomits or regurgitates.  Perhaps more rarely.  Stools will range from pellet-like at the stringy and watery.  She said many years ago she had similar issues but then it went away.  When she does feel poorly she will go on a bland diet and get better and she thinks that when she is on antibiotics these type of symptoms improved.  She relates that she had an episode in the middle of November where she had gassy symptoms abdominal  cramps incomplete evacuation and stringy stools and then diarrhea sometimes at night which gradually better got better.  Wt Readings from Last 3 Encounters:  03/22/19 146 lb 9.6 oz (66.5 kg)  03/16/19 146 lb 8 oz (66.5 kg)  10/08/18 151 lb (68.5 kg)   She had had stool testing with a gastrointestinal pathogen panel and pancreatic elastase test sent after she saw Alison Bradshaw, on the visit with Alison Bradshaw a CBC and a CMET and a lipase and a C-reactive protein and celiac studies were all normal.  Allergies  Allergen Reactions  . Percocet [Oxycodone-Acetaminophen] Nausea Only  . Ceftin  [Cefuroxime Axetil] Hives and Rash  . Timentin [Ticarcillin-Pot Clavulanate] Hives and Rash   Current Meds  Medication Sig  . acetaminophen (TYLENOL) 500 MG tablet Take 1,000 mg by mouth every 6 (six) hours as needed.  Marland Kitchen albuterol (PROVENTIL HFA;VENTOLIN HFA) 108 (90 Base) MCG/ACT inhaler Inhale 2 puffs into the lungs every 6 (six) hours as needed for shortness of breath.  . ALPRAZolam (XANAX) 0.25 MG tablet Take 0.25 mg by mouth 2 (two) times daily as needed for anxiety.  . AMBULATORY NON FORMULARY MEDICATION Medication Name: flutter valve use 3 times daily.  . fluticasone (FLONASE SENSIMIST) 27.5 MCG/SPRAY nasal spray Place 2 sprays into the nose daily.  . Fluticasone-Salmeterol (AIRDUO RESPICLICK 979/89) 211-94 MCG/ACT AEPB Inhale 1 puff into the lungs 2 (two) times daily.  . pantoprazole (PROTONIX) 40 MG tablet Take 40 mg by mouth daily.  . Probiotic Product (PHILLIPS COLON HEALTH PO) Take 1 capsule by mouth daily.  . rosuvastatin (CRESTOR) 10 MG tablet Take 10 mg by mouth daily.  . sucralfate (CARAFATE) 1 G tablet Take 1 g by mouth 4 (four) times daily -  with meals and at bedtime. Pt only takes when she's really needs it which isn't very often   Past Medical History:  Diagnosis Date  . Anxiety   . Asthma   . Atypical chest pain   . Barrett's esophagus   . Chronic gastritis   . Diverticulosis   . Duodenal ulcer   . Gastritis   . GERD (gastroesophageal reflux disease)   . Hiatal hernia   . Hyperlipidemia   . Non-obstructive CAD (coronary artery disease)    a. 10/2017 Stress Echo: Nl EF, no ischemia. Mild MR; b. 12/2017 Cardiac CTA: LM nl, LAD 50p, 30d, D1/2 nl, RI nl, LCX nl, OM1/2 nl, RCA nl, RPDA/RPL nl. Ca2+ score = 39 (46th percentile)-->statin dose escalated.  . Osteopenia   . PSVT (paroxysmal supraventricular tachycardia) ([place])    a. 10/2017 Holter: PSVT, max 5 beats->did not tolerate beta blocker.  . Pulmonary nodules    a. 12/2017 RLL 52m nodule noted on Cardiac CTA; b.  04/2018 RLL 925mnodule resolved; c. 09/2018 CT Chest: Previously noted posterior RUL nodules almost completely resolved. Bandlike scarring of RML and lingula-->consistent w/ improved atypical infxt.  . Rosacea   . Senile nuclear sclerosis   . 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 BREAST BIOPSY Left 2008   neg  . BREAST EXCISIONAL BIOPSY Left 2004   neg  . CATARACT EXTRACTION W/ INTRAOCULAR LENS  IMPLANT, BILATERAL Bilateral   . COLONOSCOPY WITH PROPOFOL N/A 01/22/2017   Procedure: COLONOSCOPY WITH PROPOFOL;  Surgeon: Toledo, TeBenay PikeMD;  Location: [facility] ENDOSCOPY;  Service: Gastroenterology;  Laterality: N/A;  . ESOPHAGOGASTRODUODENOSCOPY (EGD) WITH PROPOFOL N/A 01/20/2015   Procedure: ESOPHAGOGASTRODUODENOSCOPY (EGD) WITH PROPOFOL;  Surgeon: MaLollie SailsMD;  Location: [facility] ENDOSCOPY;  Service: Endoscopy;  Laterality: N/A;  . ESOPHAGOGASTRODUODENOSCOPY (EGD) WITH PROPOFOL N/A 01/22/2017   Procedure: ESOPHAGOGASTRODUODENOSCOPY (EGD) WITH PROPOFOL;  Surgeon: Toledo, Benay Pike, MD;  Location: [facility] ENDOSCOPY;  Service: Gastroenterology;  Laterality: N/A;  . ESOPHAGOGASTRODUODENOSCOPY (EGD) WITH PROPOFOL N/A 04/14/2018   Procedure: ESOPHAGOGASTRODUODENOSCOPY (EGD) WITH PROPOFOL;  Surgeon: Lollie Sails, MD;  Location: [facility] ENDOSCOPY;  Service: Endoscopy;  Laterality: N/A;  . EYE SURGERY Right    laser scraping  . OPEN REDUCTION INTERNAL FIXATION (ORIF) DISTAL RADIAL FRACTURE Right 02/09/2018   Procedure: OPEN REDUCTION INTERNAL FIXATION (ORIF) DISTAL RADIAL FRACTURE;  Surgeon: Hessie Knows, MD;  Location: [facility] ORS;  Service: Orthopedics;  Laterality: Right;  . TONSILLECTOMY     Social History   Social History Narrative   Married and retired 2 children   Never smoker no drug use no alcohol   family history includes AAA (abdominal aortic aneurysm) in her father; Alzheimer's disease in her mother; Diabetes in her paternal uncle;  Emphysema in her father; Hyperlipidemia in her mother; Kidney Stones in her father; Kidney cancer in her brother; Pancreatic cancer in her paternal uncle.   Review of Systems   Objective:   Physical Exam '@BP'$  112/62   Pulse (!) 58   Ht '5\' 3"'$  (1.6 m)   Wt 146 lb 9.6 oz (66.5 kg)   BMI 25.97 kg/m @  General:  NAD Eyes:   anicteric Lungs:  clear Heart::  S1S2 no rubs, murmurs or gallops Abdomen:  soft and nontender, BS+ Ext:   no edema, cyanosis or clubbing    Data Reviewed:  See HPI

## 2019-03-23 ENCOUNTER — Ambulatory Visit (HOSPITAL_COMMUNITY): Payer: Medicare Other

## 2019-03-26 LAB — GASTROINTESTINAL PATHOGEN PANEL PCR
C. difficile Tox A/B, PCR: NOT DETECTED
Campylobacter, PCR: NOT DETECTED
Cryptosporidium, PCR: NOT DETECTED
E coli (ETEC) LT/ST PCR: NOT DETECTED
E coli (STEC) stx1/stx2, PCR: NOT DETECTED
E coli 0157, PCR: NOT DETECTED
Giardia lamblia, PCR: NOT DETECTED
Norovirus, PCR: NOT DETECTED
Rotavirus A, PCR: NOT DETECTED
Salmonella, PCR: NOT DETECTED
Shigella, PCR: NOT DETECTED

## 2019-03-26 LAB — PANCREATIC ELASTASE, FECAL: Pancreatic Elastase-1, Stool: >500 mcg/g

## 2019-03-27 ENCOUNTER — Encounter: Payer: Self-pay | Admitting: Internal Medicine

## 2019-03-27 DIAGNOSIS — K225 Diverticulum of esophagus, acquired: Secondary | ICD-10-CM | POA: Insufficient documentation

## 2019-03-27 DIAGNOSIS — K580 Irritable bowel syndrome with diarrhea: Secondary | ICD-10-CM | POA: Insufficient documentation

## 2019-03-27 DIAGNOSIS — K30 Functional dyspepsia: Secondary | ICD-10-CM | POA: Insufficient documentation

## 2019-03-29 NOTE — Progress Notes (Signed)
I sent a message about this - I think - likely to another RN or CMA BUT stool tests neg for infection Proceed with SIBO test please

## 2019-04-21 ENCOUNTER — Encounter: Payer: Self-pay | Admitting: Internal Medicine

## 2019-04-22 ENCOUNTER — Ambulatory Visit: Admission: RE | Admit: 2019-04-22 | Payer: Medicare PPO | Source: Ambulatory Visit

## 2019-05-05 ENCOUNTER — Telehealth: Payer: Self-pay | Admitting: Internal Medicine

## 2019-05-05 ENCOUNTER — Encounter: Payer: Self-pay | Admitting: Internal Medicine

## 2019-05-05 DIAGNOSIS — K638219 Small intestinal bacterial overgrowth, unspecified: Secondary | ICD-10-CM

## 2019-05-05 DIAGNOSIS — K6389 Other specified diseases of intestine: Secondary | ICD-10-CM

## 2019-05-05 HISTORY — DX: Small intestinal bacterial overgrowth, unspecified: K63.8219

## 2019-05-05 HISTORY — DX: Other specified diseases of intestine: K63.89

## 2019-05-05 NOTE — Telephone Encounter (Signed)
Please call patient and explain that SIBO breath test was + - she has small intestinal bacterial overgrowth  That may be causing her GI problems that she saw me for  Treat with Xifaxan 550 mg tid x 14 days Schedule next available f/u me   IF xifaxan is too expensive for her there are alternatives I can rx so she should not purchase if unaffordable and let us know that

## 2019-05-06 ENCOUNTER — Other Ambulatory Visit: Payer: Self-pay | Admitting: *Deleted

## 2019-05-06 MED ORDER — RIFAXIMIN 550 MG PO TABS: 550.0000 mg | ORAL_TABLET | Freq: Three times a day (TID) | ORAL | 0 refills | Status: AC

## 2019-05-06 NOTE — Telephone Encounter (Signed)
Spoke to the patient who will call her pharmacy for the price of the medication. Will call back if the medication is too expensive.

## 2019-05-10 ENCOUNTER — Other Ambulatory Visit: Payer: Self-pay

## 2019-05-10 ENCOUNTER — Ambulatory Visit
Admission: RE | Admit: 2019-05-10 | Discharge: 2019-05-10 | Disposition: A | Discharge status: Home / Self Care | Payer: Medicare PPO | Source: Ambulatory Visit | Attending: Gastroenterology | Admitting: Gastroenterology

## 2019-05-10 DIAGNOSIS — R911 Solitary pulmonary nodule: Secondary | ICD-10-CM | POA: Insufficient documentation

## 2019-05-25 ENCOUNTER — Telehealth: Payer: Self-pay | Admitting: Internal Medicine

## 2019-05-25 NOTE — Telephone Encounter (Signed)
Spoke to patient who was upset stating she has called [facility] and left 3 VM's for me. I tried to explain to the patient that I did not have a VM and that there were no record of calls made to [facility] other than the call made today. She remained defiant stating she indeed left 3 VM's on someone's machine named Hollace Michelli. I told the patient again I did not have a VM and my phone does not have the capability of setting up a VM and that the protocol for any calls is the an electronic telephone note is created every time and we have no record of her calling other than 12/3 and today (2/16).   The conversation then moved to the [place] which she said she received. She stated it was $100 for the 14 day course and she could not afford another course. I explained that Dr. Carlean Purl previously stated (in our previous conversation on 1/27) that he could prescribe cheaper alternatives. Verbalized understanding. No other concerns or complaints voiced by the conclusion of the call.

## 2019-05-25 NOTE — Telephone Encounter (Signed)
Left message for the patient to call back.

## 2020-06-05 IMAGING — DX DG WRIST 2V*R*
2 series · 2 of 2 positions shown · non-contrast
Comparison: 02/08/2018

CLINICAL DATA: Status post ORIF radial fracture.

EXAM:
RIGHT WRIST - 2 VIEW

[wrist ap]
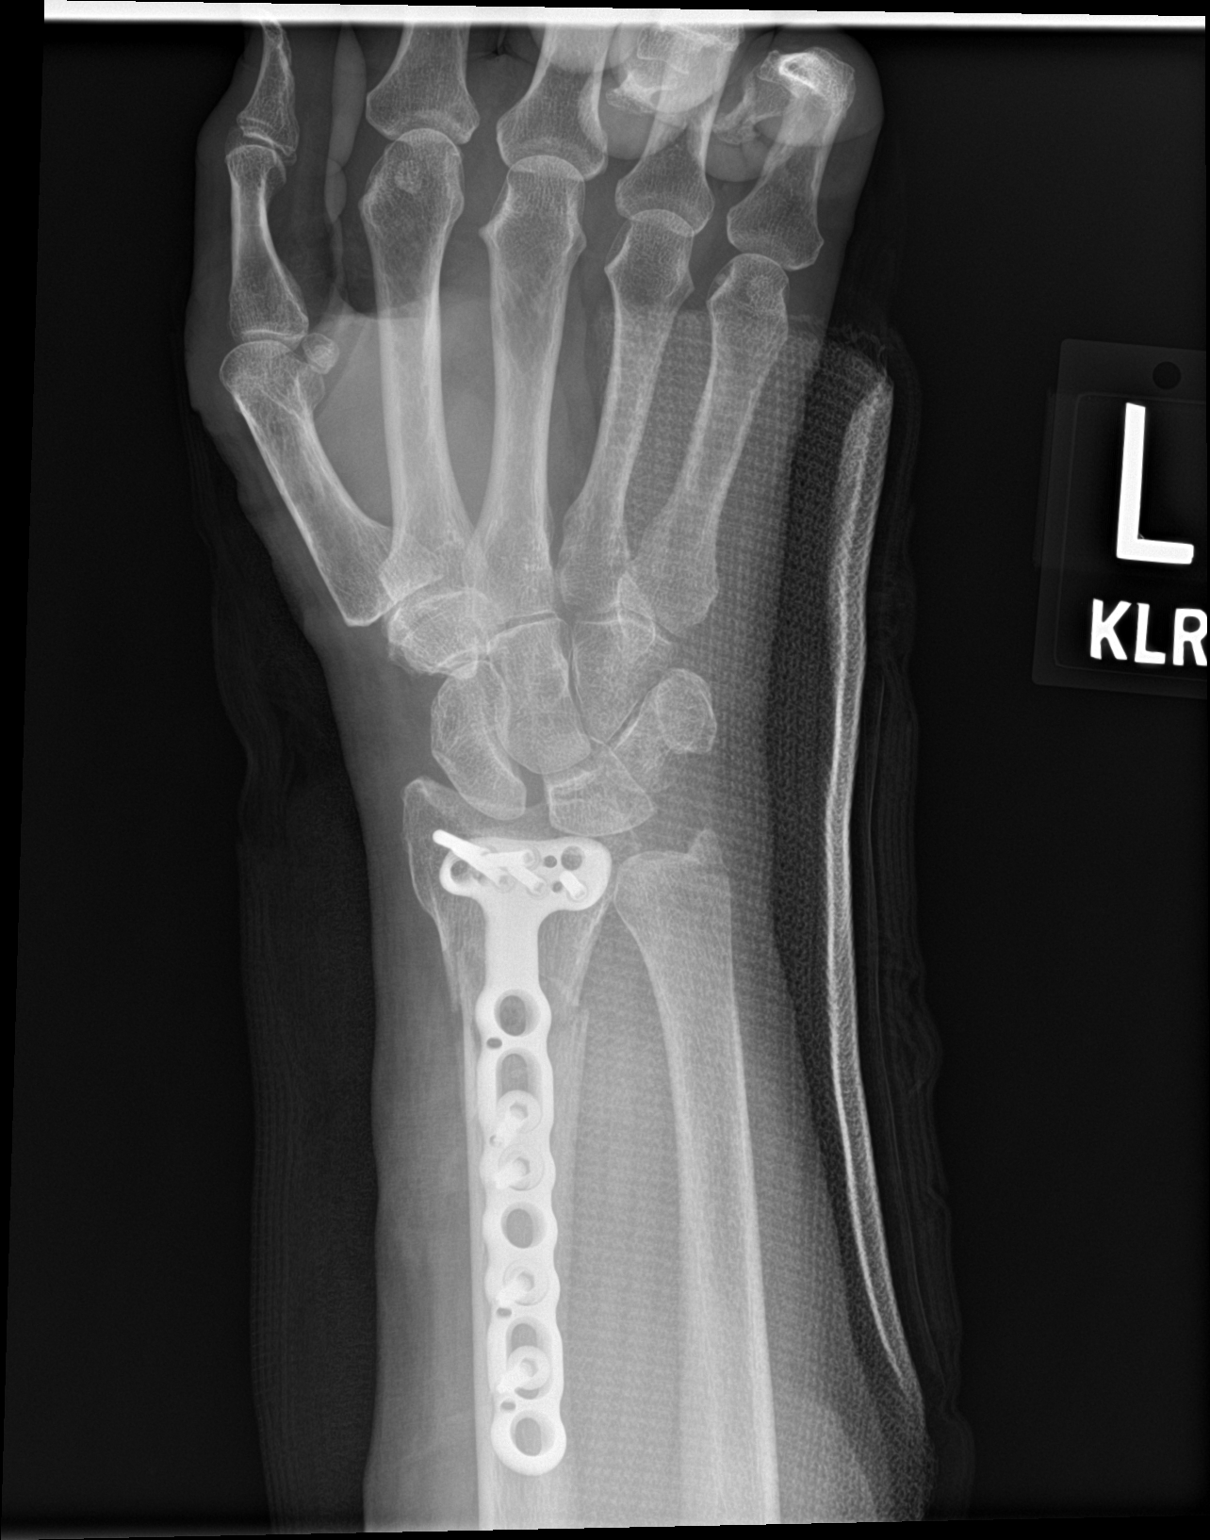

[wrist lat]
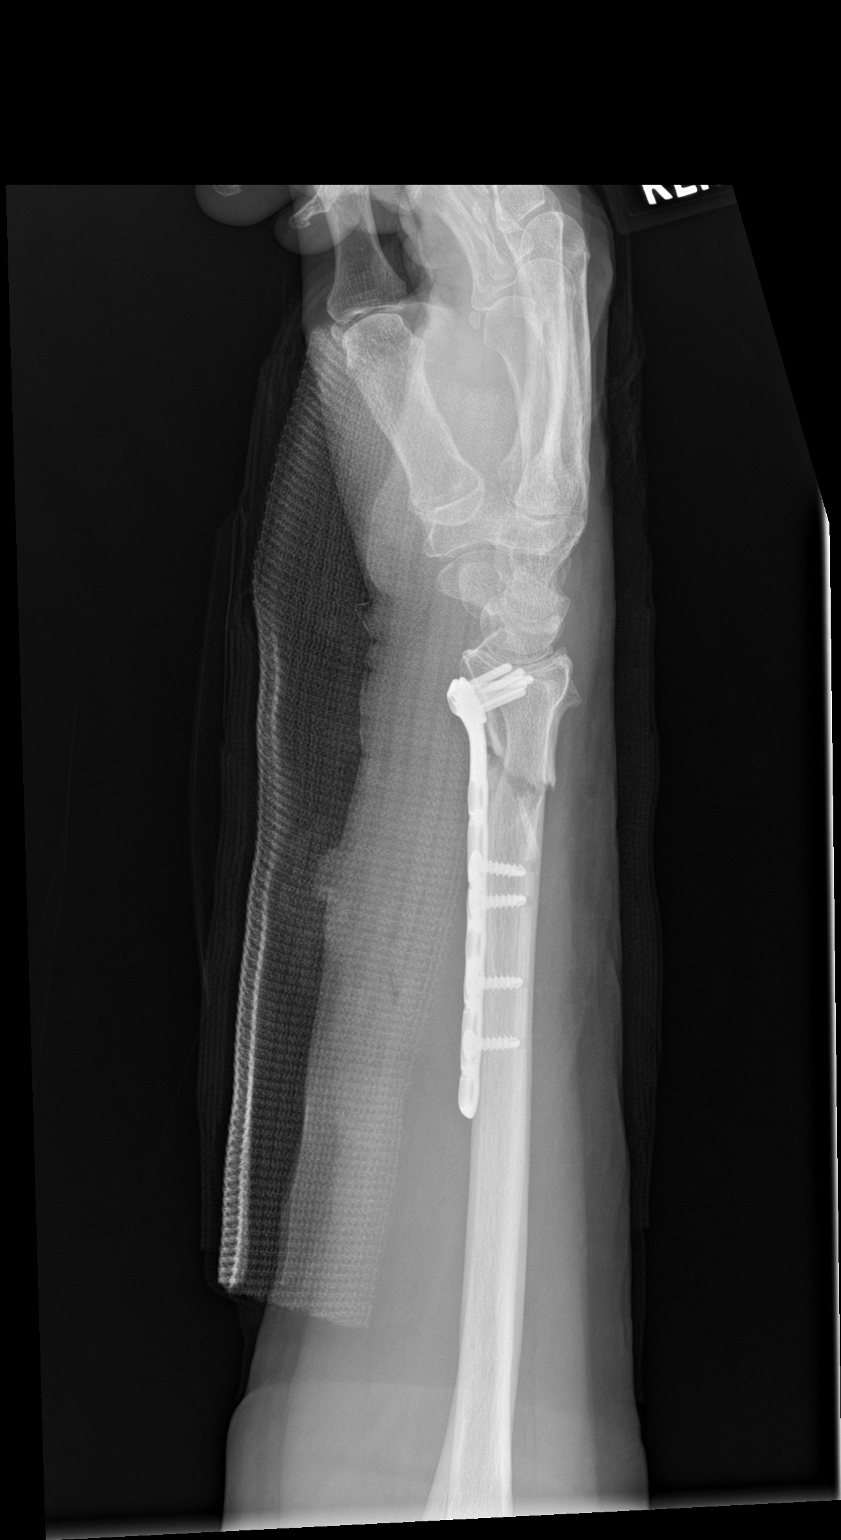

[2 of 2 positions shown; findings below may reference images not displayed]

FINDINGS: Internal plate and screw fixation noted traversing a distal radial
fracture, in near-anatomic alignment and position.

An ulnar styloid fracture is again identified.

No other significant changes
IMPRESSION: ORIF distal radial fracture, in near-anatomic alignment and
position.

## 2020-06-08 ENCOUNTER — Other Ambulatory Visit (HOSPITAL_COMMUNITY): Payer: Self-pay | Admitting: Physician Assistant

## 2020-06-08 ENCOUNTER — Other Ambulatory Visit: Payer: Self-pay | Admitting: Physician Assistant

## 2020-06-08 DIAGNOSIS — G441 Vascular headache, not elsewhere classified: Secondary | ICD-10-CM

## 2020-06-08 DIAGNOSIS — R519 Headache, unspecified: Secondary | ICD-10-CM

## 2020-06-08 DIAGNOSIS — R202 Paresthesia of skin: Secondary | ICD-10-CM

## 2020-06-12 ENCOUNTER — Ambulatory Visit
Admission: RE | Admit: 2020-06-12 | Discharge: 2020-06-12 | Disposition: A | Discharge status: Home / Self Care | Payer: Medicare PPO | Source: Ambulatory Visit | Attending: Physician Assistant | Admitting: Physician Assistant

## 2020-06-12 ENCOUNTER — Other Ambulatory Visit: Payer: Self-pay

## 2020-06-12 DIAGNOSIS — R519 Headache, unspecified: Secondary | ICD-10-CM | POA: Diagnosis present

## 2020-06-12 DIAGNOSIS — R202 Paresthesia of skin: Secondary | ICD-10-CM | POA: Insufficient documentation

## 2020-06-12 DIAGNOSIS — G441 Vascular headache, not elsewhere classified: Secondary | ICD-10-CM | POA: Insufficient documentation

## 2020-06-12 MED ORDER — GADOBUTROL 1 MMOL/ML IV SOLN
6.0000 mL | Freq: Once | INTRAVENOUS | Status: AC | PRN
  Administered 2020-06-12: 6 mL via INTRAVENOUS

## 2020-06-22 ENCOUNTER — Institutional Professional Consult (permissible substitution): Payer: Medicare PPO | Admitting: Plastic Surgery

## 2020-07-06 ENCOUNTER — Ambulatory Visit: Payer: Medicare PPO | Admitting: Plastic Surgery

## 2020-07-06 ENCOUNTER — Other Ambulatory Visit: Payer: Self-pay

## 2020-07-06 ENCOUNTER — Encounter: Payer: Self-pay | Admitting: Plastic Surgery

## 2020-07-06 VITALS — BP 125/84 | HR 64 | Ht 64.0 in | Wt 143.6 lb

## 2020-07-06 DIAGNOSIS — Z411 Encounter for cosmetic surgery: Secondary | ICD-10-CM | POA: Diagnosis not present

## 2020-07-06 DIAGNOSIS — L989 Disorder of the skin and subcutaneous tissue, unspecified: Secondary | ICD-10-CM | POA: Diagnosis not present

## 2020-07-06 NOTE — Progress Notes (Signed)
Referring Provider Baxter Hire, MD [place],  [place] [id number]   CC:  Chief Complaint  Patient presents with  . consult      Alison Bradshaw is an 80 y.o. female.  HPI: Patient presents to discuss dynamic and static lines on her face that have developed over the past year or so.  She feels like she is lost about 10 pounds during that time and just over a year ago she lost her husband.  She is bothered by the sagging aging skin in her cheeks and below her eyes and the lines of developed throughout her face and neck.  She wants to discuss a broad range of things that could be done for improvements.  Allergies  Allergen Reactions  . Percocet [Oxycodone-Acetaminophen] Nausea Only  . Ceftin [Cefuroxime Axetil] Hives and Rash  . Timentin [Ticarcillin-Pot Clavulanate] Hives and Rash    Outpatient Encounter Medications as of 07/06/2020  Medication Sig  . acetaminophen (TYLENOL) 500 MG tablet Take 1,000 mg by mouth every 6 (six) hours as needed.  Marland Kitchen albuterol (PROVENTIL HFA;VENTOLIN HFA) 108 (90 Base) MCG/ACT inhaler Inhale 2 puffs into the lungs every 6 (six) hours as needed for shortness of breath.  . ALPRAZolam (XANAX) 0.25 MG tablet Take 0.25 mg by mouth 2 (two) times daily as needed for anxiety.  . AMBULATORY NON FORMULARY MEDICATION Medication Name: flutter valve use 3 times daily.  . fluticasone (FLONASE SENSIMIST) 27.5 MCG/SPRAY nasal spray Place 2 sprays into the nose daily.  . Fluticasone-Salmeterol (AIRDUO RESPICLICK 638/75) 643-32 MCG/ACT AEPB Inhale 1 puff into the lungs 2 (two) times daily.  . pantoprazole (PROTONIX) 40 MG tablet Take 40 mg by mouth daily.  . Probiotic Product (PHILLIPS COLON HEALTH PO) Take 1 capsule by mouth daily.  . sucralfate (CARAFATE) 1 G tablet Take 1 g by mouth 4 (four) times daily -  with meals and at bedtime. Pt only takes when she's really needs it which isn't very often  . rosuvastatin (CRESTOR) 10 MG tablet Take 10 mg  by mouth daily.   No facility-administered encounter medications on file as of 07/06/2020.     Past Medical History:  Diagnosis Date  . Anxiety   . Asthma   . Atypical chest pain   . Barrett's esophagus   . Chronic gastritis   . Diverticulosis   . Duodenal ulcer   . Gastritis   . GERD (gastroesophageal reflux disease)   . Hiatal hernia   . Hyperlipidemia   . Non-obstructive CAD (coronary artery disease)    a. 10/2017 Stress Echo: Nl EF, no ischemia. Mild MR; b. 12/2017 Cardiac CTA: LM nl, LAD 50p, 30d, D1/2 nl, RI nl, LCX nl, OM1/2 nl, RCA nl, RPDA/RPL nl. Ca2+ score = 39 (46th percentile)-->statin dose escalated.  . Osteopenia   . PSVT (paroxysmal supraventricular tachycardia) ([place])    a. 10/2017 Holter: PSVT, max 5 beats->did not tolerate beta blocker.  . Pulmonary nodules    a. 12/2017 RLL 29mm nodule noted on Cardiac CTA; b. 04/2018 RLL 57mm nodule resolved; c. 09/2018 CT Chest: Previously noted posterior RUL nodules almost completely resolved. Bandlike scarring of RML and lingula-->consistent w/ improved atypical infxt.  . Rosacea   . Senile nuclear sclerosis   . 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 BREAST BIOPSY Left 2008   neg  . BREAST EXCISIONAL BIOPSY Left 2004   neg  .  CATARACT EXTRACTION W/ INTRAOCULAR LENS  IMPLANT, BILATERAL Bilateral   . COLONOSCOPY WITH PROPOFOL N/A 01/22/2017   Procedure: COLONOSCOPY WITH PROPOFOL;  Surgeon: Toledo, Benay Pike, MD;  Location: [facility] ENDOSCOPY;  Service: Gastroenterology;  Laterality: N/A;  . ESOPHAGOGASTRODUODENOSCOPY (EGD) WITH PROPOFOL N/A 01/20/2015   Procedure: ESOPHAGOGASTRODUODENOSCOPY (EGD) WITH PROPOFOL;  Surgeon: Lollie Sails, MD;  Location: [facility] ENDOSCOPY;  Service: Endoscopy;  Laterality: N/A;  . ESOPHAGOGASTRODUODENOSCOPY (EGD) WITH PROPOFOL N/A 01/22/2017   Procedure: ESOPHAGOGASTRODUODENOSCOPY (EGD) WITH PROPOFOL;   Surgeon: Toledo, Benay Pike, MD;  Location: [facility] ENDOSCOPY;  Service: Gastroenterology;  Laterality: N/A;  . ESOPHAGOGASTRODUODENOSCOPY (EGD) WITH PROPOFOL N/A 04/14/2018   Procedure: ESOPHAGOGASTRODUODENOSCOPY (EGD) WITH PROPOFOL;  Surgeon: Lollie Sails, MD;  Location: [facility] ENDOSCOPY;  Service: Endoscopy;  Laterality: N/A;  . EYE SURGERY Right    laser scraping  . OPEN REDUCTION INTERNAL FIXATION (ORIF) DISTAL RADIAL FRACTURE Right 02/09/2018   Procedure: OPEN REDUCTION INTERNAL FIXATION (ORIF) DISTAL RADIAL FRACTURE;  Surgeon: Hessie Knows, MD;  Location: [facility] ORS;  Service: Orthopedics;  Laterality: Right;  . TONSILLECTOMY      Family History  Problem Relation Age of Onset  . Hyperlipidemia Mother   . Alzheimer's disease Mother   . Emphysema Father   . AAA (abdominal aortic aneurysm) Father   . Kidney Stones Father   . Kidney cancer Brother   . Pancreatic cancer Paternal Uncle   . Diabetes Paternal Uncle   . Stomach cancer Neg Hx   . Colon cancer Neg Hx   . Esophageal cancer Neg Hx     Social History   Social History Narrative   Married and retired 2 children   Never smoker no drug use no alcohol     Review of Systems General: Denies fevers, chills, weight loss CV: Denies chest pain, shortness of breath, palpitations  Physical Exam Vitals with BMI 07/06/2020 03/22/2019 03/16/2019  Height 5\' 4"  5\' 3"  5\' 3"   Weight 143 lbs 10 oz 146 lbs 10 oz 146 lbs 8 oz  BMI 24.64 02.72 53.66  Systolic 440 347 425  Diastolic 84 62 70  Pulse 64 58 60    General:  No acute distress,  Alert and oriented, Non-Toxic, Normal speech and affect Exam shows moderate to severe static and dynamic lines throughout the forehead cheeks and neck.  She has platysmal plant banding and moderate to severe skin laxity in the neck and lower face.  Cranial nerves grossly intact.  She does have the well-healed scar on the nasal sidewall from Mohs excisions.  There is not much in the way of pigment  discrepancies but she does have a coarse texture to her skin.  Assessment/Plan Patient has changes consistent with skin laxity and loss of elasticity in the face and neck.  We discussed a broad range of solutions.  I recommended daily power defense and the ZO wrinkle in texture repair topical treatments.  We also discussed fractional laser for the skin resurfacing.  For the neck and lower face I believe her best ultimate result would be from a face and neck lift and I explained the details of that procedure to her.  She is going to think about that but is not quite ready to commit to that level of intervention.  We will have her start the skin care products and she seems interested in trying the laser and go from there.  All her questions were answered.  Cindra Presume 07/06/2020, 7:33 PM

## 2020-07-31 ENCOUNTER — Emergency Department
Admission: EM | Admit: 2020-07-31 | Discharge: 2020-07-31 | Disposition: A | Discharge status: Home / Self Care | Payer: Medicare PPO | Attending: Emergency Medicine | Admitting: Emergency Medicine

## 2020-07-31 ENCOUNTER — Other Ambulatory Visit: Payer: Self-pay

## 2020-07-31 ENCOUNTER — Emergency Department: Payer: Medicare PPO

## 2020-07-31 DIAGNOSIS — Z7952 Long term (current) use of systemic steroids: Secondary | ICD-10-CM | POA: Diagnosis not present

## 2020-07-31 DIAGNOSIS — R0789 Other chest pain: Secondary | ICD-10-CM | POA: Diagnosis not present

## 2020-07-31 DIAGNOSIS — F17200 Nicotine dependence, unspecified, uncomplicated: Secondary | ICD-10-CM | POA: Diagnosis not present

## 2020-07-31 DIAGNOSIS — I2510 Atherosclerotic heart disease of native coronary artery without angina pectoris: Secondary | ICD-10-CM | POA: Insufficient documentation

## 2020-07-31 DIAGNOSIS — J45909 Unspecified asthma, uncomplicated: Secondary | ICD-10-CM | POA: Insufficient documentation

## 2020-07-31 DIAGNOSIS — K2970 Gastritis, unspecified, without bleeding: Secondary | ICD-10-CM

## 2020-07-31 DIAGNOSIS — K2971 Gastritis, unspecified, with bleeding: Secondary | ICD-10-CM | POA: Diagnosis not present

## 2020-07-31 LAB — CBC
HCT: 38.4 % (ref 36.0–46.0)
Hemoglobin: 13.2 g/dL (ref 12.0–15.0)
MCH: 29.9 pg (ref 26.0–34.0)
MCHC: 34.4 g/dL (ref 30.0–36.0)
MCV: 87.1 fL (ref 80.0–100.0)
Platelets: 194 10*3/uL (ref 150–400)
RBC: 4.41 MIL/uL (ref 3.87–5.11)
RDW: 13.2 % (ref 11.5–15.5)
WBC: 5.2 10*3/uL (ref 4.0–10.5)
nRBC: 0 % (ref 0.0–0.2)

## 2020-07-31 LAB — BASIC METABOLIC PANEL
Anion gap: 9 (ref 5–15)
BUN: 11 mg/dL (ref 8–23)
CO2: 26 mmol/L (ref 22–32)
Calcium: 9.3 mg/dL (ref 8.9–10.3)
Chloride: 100 mmol/L (ref 98–111)
Creatinine, Ser: 0.83 mg/dL (ref 0.44–1.00)
GFR, Estimated: >60 mL/min (ref >60)
Glucose, Bld: 130 mg/dL — ABNORMAL HIGH (ref 70–99)
Potassium: 4.3 mmol/L (ref 3.5–5.1)
Sodium: 135 mmol/L (ref 135–145)

## 2020-07-31 LAB — TROPONIN I (HIGH SENSITIVITY)
Troponin I (High Sensitivity): 4 ng/L (ref <18)
Troponin I (High Sensitivity): 5 ng/L (ref <18)

## 2020-07-31 MED ORDER — IPRATROPIUM-ALBUTEROL 0.5-2.5 (3) MG/3ML IN SOLN
3.0000 mL | Freq: Once | RESPIRATORY_TRACT | Status: AC
  Administered 2020-07-31: 3 mL via RESPIRATORY_TRACT
  Filled 2020-07-31: qty 3

## 2020-07-31 MED ORDER — LIDOCAINE VISCOUS HCL 2 % MT SOLN
15.0000 mL | Freq: Once | OROMUCOSAL | Status: AC
  Administered 2020-07-31: 15 mL via ORAL
  Filled 2020-07-31: qty 15

## 2020-07-31 MED ORDER — ALUM & MAG HYDROXIDE-SIMETH 200-200-20 MG/5ML PO SUSP
30.0000 mL | Freq: Once | ORAL | Status: AC
  Administered 2020-07-31: 30 mL via ORAL
  Filled 2020-07-31: qty 30

## 2020-07-31 NOTE — ED Notes (Signed)
Report given to Jessica, RN.

## 2020-07-31 NOTE — Discharge Instructions (Addendum)
Please seek medical attention for any high fevers, chest pain, shortness of breath, change in behavior, persistent vomiting, bloody stool or any other new or concerning symptoms.  

## 2020-07-31 NOTE — ED Triage Notes (Signed)
Pt comes with c/o central CP and SOB. Pt states this started week ago. Pt states dizziness and nausea. Pt states it feel like heaviness on her chest.

## 2020-07-31 NOTE — ED Notes (Signed)
Pt reports she presented to [facility] this afternoon with reports of chest discomfort, she was then sent here for further evaluation. Pt reports she had an episode last week that lasted for 2 hours then symptoms started again last night. Pt reports "chest heaviness" and a slight pain in her back. Also endorses nausea and dizziness. She also describes a "vibration" feeling in her chest that started last night as well. AAO NAD VSS

## 2020-07-31 NOTE — ED Provider Notes (Signed)
[facility] Emergency Department Provider Note   ____________________________________________   I have reviewed the triage vital signs and the nursing notes.   HISTORY  Chief Complaint Chest discomfort  History limited by: Not Limited   HPI Alison Bradshaw is a 80 y.o. female who presents to the emergency department today because of concern for chest discomfort and a feeling of "wooziness". She states that symptoms initially were present two weeks ago although only lasted for about 15 minutes. They started again today and persisted. She describes the discomfort as a feeling of pressure that started around her belly button and then goes up through her chest. The wooziness is no longer present at the time of my exam but she still has some of the discomfort. When it did start she did have some sweating. The patient denies any measured fevers. States she does have a history of GERD.   Records reviewed. Per medical record review patient has a history of GERD, barrett's esophagus.   Past Medical History:  Diagnosis Date  . Anxiety   . Asthma   . Atypical chest pain   . Barrett's esophagus   . Chronic gastritis   . Diverticulosis   . Duodenal ulcer   . Gastritis   . GERD (gastroesophageal reflux disease)   . Hiatal hernia   . Hyperlipidemia   . Non-obstructive CAD (coronary artery disease)    a. 10/2017 Stress Echo: Nl EF, no ischemia. Mild MR; b. 12/2017 Cardiac CTA: LM nl, LAD 50p, 30d, D1/2 nl, RI nl, LCX nl, OM1/2 nl, RCA nl, RPDA/RPL nl. Ca2+ score = 39 (46th percentile)-->statin dose escalated.  . Osteopenia   . PSVT (paroxysmal supraventricular tachycardia) ([place])    a. 10/2017 Holter: PSVT, max 5 beats->did not tolerate beta blocker.  . Pulmonary nodules    a. 12/2017 RLL 52mm nodule noted on Cardiac CTA; b. 04/2018 RLL 23mm nodule resolved; c. 09/2018 CT Chest: Previously noted posterior RUL nodules almost completely resolved. Bandlike scarring of RML and  lingula-->consistent w/ improved atypical infxt.  . Rosacea   . Senile nuclear sclerosis   . 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 Zenker's diverticulum - asymptomatic 03/27/2019  . 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 Alopecia 05/14/2018  . Lung nodule 03/30/2018  . Osteopenia of multiple sites 10/10/2016  . Nausea 10/08/2016  . Need for vaccination 12/28/2015  . GAD (generalized anxiety disorder) 09/29/2015  . Hematuria, microscopic 09/29/2015  . Onychomycosis of toenail 09/29/2015  . Anxiety 09/21/2014  . Mixed hyperlipidemia 02/22/2014  . Abnormal blood chemistry 12/25/2012  . Abnormal chest CT 12/25/2012  . Flushing reaction 11/19/2012  . Gastroesophageal reflux disease 11/19/2012  . 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  . Multiple gastric polyps 11/02/2012    Past Surgical History:  Procedure Laterality Date  . BREAST BIOPSY Left 2008   neg  . BREAST EXCISIONAL BIOPSY Left 2004   neg  . CATARACT EXTRACTION W/ INTRAOCULAR LENS  IMPLANT, BILATERAL Bilateral   . COLONOSCOPY WITH PROPOFOL N/A 01/22/2017   Procedure: COLONOSCOPY WITH PROPOFOL;  Surgeon: Toledo, Benay Pike, MD;  Location: [facility] ENDOSCOPY;  Service: Gastroenterology;  Laterality: N/A;  . ESOPHAGOGASTRODUODENOSCOPY (EGD) WITH PROPOFOL N/A 01/20/2015   Procedure: ESOPHAGOGASTRODUODENOSCOPY (EGD) WITH PROPOFOL;  Surgeon: Lollie Sails, MD;  Location: [facility] ENDOSCOPY;  Service: Endoscopy;  Laterality: N/A;  . ESOPHAGOGASTRODUODENOSCOPY (EGD) WITH PROPOFOL N/A 01/22/2017   Procedure: ESOPHAGOGASTRODUODENOSCOPY (EGD) WITH PROPOFOL;  Surgeon: Toledo, Benay Pike, MD;  Location: [facility] ENDOSCOPY;  Service: Gastroenterology;  Laterality: N/A;  . ESOPHAGOGASTRODUODENOSCOPY (EGD) WITH PROPOFOL N/A 04/14/2018    Procedure: ESOPHAGOGASTRODUODENOSCOPY (EGD) WITH PROPOFOL;  Surgeon: Lollie Sails, MD;  Location: [facility] ENDOSCOPY;  Service: Endoscopy;  Laterality: N/A;  . EYE SURGERY Right    laser scraping  . OPEN REDUCTION INTERNAL FIXATION (ORIF) DISTAL RADIAL FRACTURE Right 02/09/2018   Procedure: OPEN REDUCTION INTERNAL FIXATION (ORIF) DISTAL RADIAL FRACTURE;  Surgeon: Hessie Knows, MD;  Location: [facility] ORS;  Service: Orthopedics;  Laterality: Right;  . TONSILLECTOMY      Prior to Admission medications   Medication Sig Start Date End Date Taking? Authorizing Provider  acetaminophen (TYLENOL) 500 MG tablet Take 1,000 mg by mouth every 6 (six) hours as needed.    [provider]  albuterol (PROVENTIL HFA;VENTOLIN HFA) 108 (90 Base) MCG/ACT inhaler Inhale 2 puffs into the lungs every 6 (six) hours as needed for shortness of breath. 05/15/18   Wilhelmina Mcardle, MD  ALPRAZolam Duanne Moron) 0.25 MG tablet Take 0.25 mg by mouth 2 (two) times daily as needed for anxiety.    [provider]  AMBULATORY NON FORMULARY MEDICATION Medication Name: flutter valve use 3 times daily. 05/14/18   Wilhelmina Mcardle, MD  fluticasone (FLONASE SENSIMIST) 27.5 MCG/SPRAY nasal spray Place 2 sprays into the nose daily. 12/22/17   Wilhelmina Mcardle, MD  Fluticasone-Salmeterol (AIRDUO RESPICLICK 093/81) 829-93 MCG/ACT AEPB Inhale 1 puff into the lungs 2 (two) times daily. 11/14/17   Wilhelmina Mcardle, MD  pantoprazole (PROTONIX) 40 MG tablet Take 40 mg by mouth daily.    [provider]  Probiotic Product ([employer]) Take 1 capsule by mouth daily.    [provider]  rosuvastatin (CRESTOR) 10 MG tablet Take 10 mg by mouth daily. 11/27/18 11/27/19  [provider]  sucralfate (CARAFATE) 1 G tablet Take 1 g by mouth 4 (four) times daily -  with meals and at bedtime. Pt only takes when she's really needs it which isn't very often    [provider]    Allergies Percocet  [oxycodone-acetaminophen], Ceftin [cefuroxime axetil], and Timentin [ticarcillin-pot clavulanate]  Family History  Problem Relation Age of Onset  . Hyperlipidemia Mother   . Alzheimer's disease Mother   . Emphysema Father   . AAA (abdominal aortic aneurysm) Father   . Kidney Stones Father   . Kidney cancer Brother   . Pancreatic cancer Paternal Uncle   . Diabetes Paternal Uncle   . Stomach cancer Neg Hx   . Colon cancer Neg Hx   . Esophageal cancer Neg Hx     Social History Social History   Tobacco Use  . Smoking status: Current Some Day Smoker  . Smokeless tobacco: Never Used  Vaping Use  . Vaping Use: Never used  Substance Use Topics  . Alcohol use: Never    Alcohol/week: 1.0 standard drink    Types: 1 Glasses of wine per week  . Drug use: No    Review of Systems Constitutional: No fever/chills Eyes: No visual changes. ENT: No sore throat. Cardiovascular: Positive for chest pressure.  Respiratory: Denies shortness of breath. Gastrointestinal: Positive for abdominal pain.  Genitourinary: Negative for dysuria. Musculoskeletal: Negative for back pain. Skin: Positive for sweating episode.  Neurological: Positive for wooziness.  ____________________________________________   PHYSICAL EXAM:  VITAL SIGNS: ED  Triage Vitals  Enc Vitals Group     BP 07/31/20 1840 130/78     Pulse Rate 07/31/20 1840 64     Resp 07/31/20 1840 18     Temp 07/31/20 1840 97.9 F (36.6 C)     Temp Source 07/31/20 1840 Oral     SpO2 07/31/20 1840 100 %     Weight --      Height --      Head Circumference --      Peak Flow --      Pain Score 07/31/20 1838 6   Constitutional: Alert and oriented.  Eyes: Conjunctivae are normal.  ENT      Head: Normocephalic and atraumatic.      Nose: No congestion/rhinnorhea.      Mouth/Throat: Mucous membranes are moist.      Neck: No stridor. Hematological/Lymphatic/Immunilogical: No cervical lymphadenopathy. Cardiovascular: Normal rate,  regular rhythm.  No murmurs, rubs, or gallops.  Respiratory: Normal respiratory effort without tachypnea nor retractions. Breath sounds are clear and equal bilaterally. No wheezes/rales/rhonchi. Gastrointestinal: Soft and non tender. No rebound. No guarding.  Genitourinary: Deferred Musculoskeletal: Normal range of motion in all extremities. No lower extremity edema. Neurologic:  Normal speech and language. No gross focal neurologic deficits are appreciated.  Skin:  Skin is warm, dry and intact. No rash noted. Psychiatric: Mood and affect are normal. Speech and behavior are normal. Patient exhibits appropriate insight and judgment.  ____________________________________________    LABS (pertinent positives/negatives)  CBC wbc 5.2, hgb 13.2, plt 194 BMP wnl except glu 130 Trop hs 4 to 5 ____________________________________________   EKG  I, Nance Pear, attending physician, personally viewed and interpreted this EKG  EKG Time: 1840 Rate: 59 Rhythm: sinus bradycardia Axis: normal Intervals: qtc 405 QRS: narrow ST changes: no st elevation Impression: abnormal ekg  ____________________________________________    RADIOLOGY  CXR No acute findings ____________________________________________   PROCEDURES  Procedures  ____________________________________________   INITIAL IMPRESSION / ASSESSMENT AND PLAN / ED COURSE  Pertinent labs & imaging results that were available during my care of the patient were reviewed by me and considered in my medical decision making (see chart for details).   Patient presented to the emergency department today because of concerns for chest pain.  On exam she states it actually starts close to his bellybutton.  Patient had 2 sets of troponin which were both negative here.  Chest x-ray without any acute concerning findings.  Patient does have a history of GERD and did not feel better after the GI cocktail.  This point I do think a lot of  her symptoms are likely related to gastritis and esophagitis.  She did continue to complain of some congestion as well so was given a DuoNeb treatment.  At this time no pneumonia or pneumothorax noted on chest x-ray.  Doubt PE or dissection.  Given that the patient clinically feels improved I do think it is reasonable for patient be discharged home.  She states that she has an appointment with her primary care already scheduled in 2 days.  ____________________________________________   FINAL CLINICAL IMPRESSION(S) / ED DIAGNOSES  Final diagnoses:  Atypical chest pain  Gastritis, presence of bleeding unspecified, unspecified chronicity, unspecified gastritis type     Note: This dictation was prepared with Dragon dictation. Any transcriptional errors that result from this process are unintentional     Nance Pear, MD 07/31/20 2329

## 2020-07-31 NOTE — ED Notes (Signed)
Pt reports feeling better after GI cocktail.

## 2020-08-02 ENCOUNTER — Telehealth: Payer: Self-pay | Admitting: Cardiovascular Disease

## 2020-08-02 NOTE — Telephone Encounter (Signed)
Incoming call received from the patients pcp Dr. Edwina Bradshaw with [facility] Bradshaw.  Dr. Edwina Bradshaw sts that he recently seen the patient and she reported symptoms to him that are concerning and needs to be evaluated by Cardiology.  Patient reports 2 episodes of syncope associated with light headiness, chest pressure, and diaphoresis. Patient was seen in the ED and was told it was gastroenteritis and d/c home.  AlisonJohnston rqst that patient be seen this week by Dr. Fletcher Bradshaw. Adv Dr. Edwina Bradshaw that Dr. Fletcher Bradshaw is not in the office today. He does have an opening tomorrow 4/28 @ 1:40 pm. Adv Dr. Edwina Bradshaw that I will call the patient to offer her the appt.  Called the patient. Patient will accept the appt offered 08/03/20 @ 1:40 pm with Dr. Fletcher Bradshaw. Patient sts that she is scheduled to fly to [place] on Mon 08/07/20 and would like to discuss with the MD if that is advisable. Adv her that she can discuss this with Dr. Fletcher Bradshaw tomorrow.

## 2020-08-02 NOTE — Telephone Encounter (Signed)
[facility] physician calling to speak with Dr. Tyrell Antonio nurse regarding patient having symptoms and needing appointment

## 2020-08-03 ENCOUNTER — Other Ambulatory Visit: Payer: Self-pay

## 2020-08-03 ENCOUNTER — Ambulatory Visit: Payer: Medicare PPO | Admitting: Cardiovascular Disease

## 2020-08-03 ENCOUNTER — Encounter: Payer: Self-pay | Admitting: Cardiovascular Disease

## 2020-08-03 VITALS — BP 110/70 | HR 78 | Ht 63.5 in | Wt 141.1 lb

## 2020-08-03 DIAGNOSIS — R002 Palpitations: Secondary | ICD-10-CM

## 2020-08-03 DIAGNOSIS — R0602 Shortness of breath: Secondary | ICD-10-CM | POA: Diagnosis not present

## 2020-08-03 DIAGNOSIS — R0789 Other chest pain: Secondary | ICD-10-CM | POA: Diagnosis not present

## 2020-08-03 NOTE — Patient Instructions (Signed)
Medication Instructions:  Your physician recommends that you continue on your current medications as directed. Please refer to the Current Medication list given to you today.  *If you need a refill on your cardiac medications before your next appointment, please call your pharmacy*   Lab Work: None ordered If you have labs (blood work) drawn today and your tests are completely normal, you will receive your results only by: Marland Kitchen MyChart Message (if you have MyChart) OR . A paper copy in the mail If you have any lab test that is abnormal or we need to change your treatment, we will call you to review the results.   Testing/Procedures: Your physician has requested that you have an echocardiogram. Echocardiography is a painless test that uses sound waves to create images of your heart. It provides your doctor with information about the size and shape of your heart and how well your heart's chambers and valves are working. This procedure takes approximately one hour. There are no restrictions for this procedure.  Your physician has recommended that you wear a Zio monitor XT. (To be worn for 14 days)  This monitor is a medical device that records the heart's electrical activity. Doctors most often use these monitors to diagnose arrhythmias. Arrhythmias are problems with the speed or rhythm of the heartbeat. The monitor is a small device applied to your chest. You can wear one while you do your normal daily activities. While wearing this monitor if you have any symptoms to push the button and record what you felt. Once you have worn this monitor for the period of time provider prescribed (Usually 14 days), you will return the monitor device in the postage paid box. Once it is returned they will download the data collected and provide [place] with a report which the provider will then review and we will call you with those results. Important tips:  1. Avoid showering during the first 24 hours of wearing the  monitor. 2. Avoid excessive sweating to help maximize wear time. 3. Do not submerge the device, no hot tubs, and no swimming pools. 4. Keep any lotions or oils away from the patch. 5. After 24 hours you may shower with the patch on. Take brief showers with your back facing the shower head.  6. Do not remove patch once it has been placed because that will interrupt data and decrease adhesive wear time. 7. Push the button when you have any symptoms and write down what you were feeling. 8. Once you have completed wearing your monitor, remove and place into box which has postage paid and place in your outgoing mailbox.  9. If for some reason you have misplaced your box then call our office and we can provide another box and/or mail it off for you.  (Zio monitor to be placed when your return from [place])    Follow-Up: At [facility], you and your health needs are our priority.  As part of our continuing mission to provide you with exceptional heart care, we have created designated Provider Care Teams.  These Care Teams include your primary Cardiologist (physician) and Advanced Practice Providers (APPs -  Physician Assistants and Nurse Practitioners) who all work together to provide you with the care you need, when you need it.  We recommend signing up for the patient portal called "MyChart".  Sign up information is provided on this After Visit Summary.  MyChart is used to connect with patients for Virtual Visits (Telemedicine).  Patients are able to  view lab/test results, encounter notes, upcoming appointments, etc.  Non-urgent messages can be sent to your provider as well.   To learn more about what you can do with MyChart, go to [email or web address].    Your next appointment:   8 week(s)  The format for your next appointment:   In Person  Provider:   You may see Kathlyn Sacramento, MD or one of the following Advanced Practice Providers on your designated Care Team:    Murray Hodgkins,  NP  Christell Faith, PA-C  Marrianne Mood, PA-C  Cadence Mineral, [place]  Laurann Montana, NP    Other Instructions N/A

## 2020-08-03 NOTE — Progress Notes (Signed)
Cardiology Office Note   Date:  08/03/2020   ID:  Alison, Bradshaw 09-22-1940, MRN [id number]  PCP:  Baxter Hire, MD  Cardiologist:   Kathlyn Sacramento, MD   Chief Complaint  Patient presents with  . Other    C/o rapid heart beats mid sternum, dizziness and lightheaded. Meds reviewed verbally with pt.      History of Present Illness: Alison Bradshaw is a 80 y.o. female who presents for evaluation of chest pain palpitations and presyncope.     She has known history of GERD, previous slight tobacco use and hyperlipidemia.  There is no family history of coronary artery disease. She was seen by me in 2019 for atypical chest pain.  She had a stress echocardiogram done which showed no evidence of ischemia with normal ejection fraction.  Her symptoms persisted and thus she underwent cardiac CTA in September 2019 which showed a calcium score of 39 with mild nonobstructive coronary artery disease in the LAD distribution. She did have palpitations and Holter monitor showed short runs of SVT.  She was placed on a beta-blocker but did not tolerate due to dizziness.  She was most recently seen in our office in July 2020.  Unfortunately, her husband died last year of diverticulitis and she had significant stress and anxiety since then.  She is planning to fly to [place] soon for a 1 year anniversary memorial service for her husband.  Recently, she was driving and had some chest discomfort with sweating and palpitation.  She felt dizzy but did not have syncope.  She stopped driving and rested.  She had a second episode few days later and thus she went to the emergency room for evaluation.  Her labs were unremarkable and troponin was negative.  She reports intermittent palpitations with associated dizziness. She reports that the chest pain responded mostly to a GI cocktail.   Past Medical History:  Diagnosis Date  . Anxiety   . Asthma   . Atypical chest pain   . Barrett's  esophagus   . Chronic gastritis   . Diverticulosis   . Duodenal ulcer   . Gastritis   . GERD (gastroesophageal reflux disease)   . Hiatal hernia   . Hyperlipidemia   . Non-obstructive CAD (coronary artery disease)    a. 10/2017 Stress Echo: Nl EF, no ischemia. Mild MR; b. 12/2017 Cardiac CTA: LM nl, LAD 50p, 30d, D1/2 nl, RI nl, LCX nl, OM1/2 nl, RCA nl, RPDA/RPL nl. Ca2+ score = 39 (46th percentile)-->statin dose escalated.  . Osteopenia   . PSVT (paroxysmal supraventricular tachycardia) ([place])    a. 10/2017 Holter: PSVT, max 5 beats->did not tolerate beta blocker.  . Pulmonary nodules    a. 12/2017 RLL 23mm nodule noted on Cardiac CTA; b. 04/2018 RLL 70mm nodule resolved; c. 09/2018 CT Chest: Previously noted posterior RUL nodules almost completely resolved. Bandlike scarring of RML and lingula-->consistent w/ improved atypical infxt.  . Rosacea   . Senile nuclear sclerosis   . 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 BREAST BIOPSY Left 2008   neg  . BREAST EXCISIONAL BIOPSY Left 2004   neg  . CATARACT EXTRACTION W/ INTRAOCULAR LENS  IMPLANT, BILATERAL Bilateral   . COLONOSCOPY WITH PROPOFOL N/A 01/22/2017   Procedure: COLONOSCOPY WITH PROPOFOL;  Surgeon: Toledo, Benay Pike, MD;  Location: [facility] ENDOSCOPY;  Service: Gastroenterology;  Laterality: N/A;  .  ESOPHAGOGASTRODUODENOSCOPY (EGD) WITH PROPOFOL N/A 01/20/2015   Procedure: ESOPHAGOGASTRODUODENOSCOPY (EGD) WITH PROPOFOL;  Surgeon: Lollie Sails, MD;  Location: [facility] ENDOSCOPY;  Service: Endoscopy;  Laterality: N/A;  . ESOPHAGOGASTRODUODENOSCOPY (EGD) WITH PROPOFOL N/A 01/22/2017   Procedure: ESOPHAGOGASTRODUODENOSCOPY (EGD) WITH PROPOFOL;  Surgeon: Toledo, Benay Pike, MD;  Location: [facility] ENDOSCOPY;  Service: Gastroenterology;  Laterality: N/A;  . ESOPHAGOGASTRODUODENOSCOPY (EGD) WITH PROPOFOL N/A 04/14/2018   Procedure:  ESOPHAGOGASTRODUODENOSCOPY (EGD) WITH PROPOFOL;  Surgeon: Lollie Sails, MD;  Location: [facility] ENDOSCOPY;  Service: Endoscopy;  Laterality: N/A;  . EYE SURGERY Right    laser scraping  . OPEN REDUCTION INTERNAL FIXATION (ORIF) DISTAL RADIAL FRACTURE Right 02/09/2018   Procedure: OPEN REDUCTION INTERNAL FIXATION (ORIF) DISTAL RADIAL FRACTURE;  Surgeon: Hessie Knows, MD;  Location: [facility] ORS;  Service: Orthopedics;  Laterality: Right;  . TONSILLECTOMY       Current Outpatient Medications  Medication Sig Dispense Refill  . acetaminophen (TYLENOL) 500 MG tablet Take 1,000 mg by mouth every 6 (six) hours as needed.    Marland Kitchen albuterol (PROVENTIL HFA;VENTOLIN HFA) 108 (90 Base) MCG/ACT inhaler Inhale 2 puffs into the lungs every 6 (six) hours as needed for shortness of breath. 3 Inhaler 0  . ALPRAZolam (XANAX) 0.25 MG tablet Take 0.25 mg by mouth 2 (two) times daily as needed for anxiety.    . fluticasone (FLONASE SENSIMIST) 27.5 MCG/SPRAY nasal spray Place 2 sprays into the nose daily. 10 g 12  . Fluticasone-Salmeterol (AIRDUO RESPICLICK 366/44) 034-74 MCG/ACT AEPB Inhale 1 puff into the lungs 2 (two) times daily. 1 each 10  . pantoprazole (PROTONIX) 40 MG tablet Take 40 mg by mouth daily.    . Probiotic Product (PHILLIPS COLON HEALTH PO) Take 1 capsule by mouth daily.    . rosuvastatin (CRESTOR) 10 MG tablet Take 10 mg by mouth daily.    . sucralfate (CARAFATE) 1 G tablet Take 1 g by mouth 4 (four) times daily -  with meals and at bedtime. Pt only takes when she's really needs it which isn't very often     No current facility-administered medications for this visit.    Allergies:   Percocet [oxycodone-acetaminophen], Ceftin [cefuroxime axetil], and Timentin [ticarcillin-pot clavulanate]    Social History:  The patient  reports that she has been smoking. She has never used smokeless tobacco. She reports that she does not drink alcohol and does not use drugs.   Family History:  The patient's  family history includes AAA (abdominal aortic aneurysm) in her father; Alzheimer's disease in her mother; Diabetes in her paternal uncle; Emphysema in her father; Hyperlipidemia in her mother; Kidney Stones in her father; Kidney cancer in her brother; Pancreatic cancer in her paternal uncle.    ROS:  Please see the history of present illness.   Otherwise, review of systems are positive for none.   All other systems are reviewed and negative.    PHYSICAL EXAM: VS:  BP 110/70 (BP Location: Left Arm, Patient Position: Sitting, Cuff Size: Normal)   Pulse 78   Ht 5' 3.5" (1.613 m)   Wt 141 lb 2 oz (64 kg)   SpO2 97%   BMI 24.61 kg/m  , BMI Body mass index is 24.61 kg/m. GEN: Well nourished, well developed, in no acute distress  HEENT: normal  Neck: no JVD, carotid bruits, or masses Cardiac: RRR; no murmurs, rubs, or gallops,no edema  Respiratory:  clear to auscultation bilaterally, normal work of breathing GI: soft, nontender, nondistended, + BS MS: no deformity or  atrophy  Skin: warm and dry, no rash Neuro:  Strength and sensation are intact Psych: euthymic mood, full affect   EKG:  EKG is ordered today. The ekg ordered today demonstrates normal sinus rhythm with no significant ST or T wave changes.  2 PVCs.   Recent Labs: 07/31/2020: BUN 11; Creatinine, Ser 0.83; Hemoglobin 13.2; Platelets 194; Potassium 4.3; Sodium 135    Lipid Panel No results found for: CHOL, TRIG, HDL, CHOLHDL, VLDL, LDLCALC, LDLDIRECT    Wt Readings from Last 3 Encounters:  08/03/20 141 lb 2 oz (64 kg)  07/31/20 135 lb (61.2 kg)  07/06/20 143 lb 9.6 oz (65.1 kg)      No flowsheet data found.    ASSESSMENT AND PLAN:  1.  Atypical chest pain and shortness of breath: The chest pain is atypical and seems to be GI in nature.  Previous cardiac CTA in 2019 showed mild nonobstructive coronary artery disease.  No ischemic changes on her EKG.  No plans for ischemic cardiac evaluation at the present time but  I requested an echocardiogram.    2.  Palpitations with presyncope: Previous monitor showed short SVTs and some PVCs.  I requested a 2-week ZIO monitor.  She does feel that some of her symptoms are triggered by anxiety especially since the death of her husband last year.  3.  Hyperlipidemia: Currently on rosuvastatin 10 mg daily.  Most recent lipid profile in January showed an LDL of 93.    Disposition:   FU with me in 2 months.  Signed,  Kathlyn Sacramento, MD  08/03/2020 2:16 PM    [facility]

## 2020-08-22 ENCOUNTER — Other Ambulatory Visit: Payer: Self-pay

## 2020-08-22 ENCOUNTER — Ambulatory Visit (INDEPENDENT_AMBULATORY_CARE_PROVIDER_SITE_OTHER): Payer: Medicare PPO

## 2020-08-22 DIAGNOSIS — R002 Palpitations: Secondary | ICD-10-CM

## 2020-08-22 DIAGNOSIS — R0602 Shortness of breath: Secondary | ICD-10-CM | POA: Diagnosis not present

## 2020-08-23 LAB — ECHOCARDIOGRAM COMPLETE
AR max vel: 2.49 cm2
AV Area VTI: 1.85 cm2
AV Area mean vel: 1.99 cm2
AV Mean grad: 4 mmHg
AV Peak grad: 7.7 mmHg
Ao pk vel: 1.39 m/s
Area-P 1/2: 3.48 cm2
Calc EF: 63.4 %
S' Lateral: 1.4 cm
Single Plane A2C EF: 62.1 %
Single Plane A4C EF: 65.2 %

## 2020-09-26 ENCOUNTER — Other Ambulatory Visit: Payer: Self-pay | Admitting: Otolaryngology

## 2020-09-26 DIAGNOSIS — H9312 Tinnitus, left ear: Secondary | ICD-10-CM

## 2020-10-06 ENCOUNTER — Ambulatory Visit
Admission: RE | Admit: 2020-10-06 | Discharge: 2020-10-06 | Disposition: A | Discharge status: Home / Self Care | Payer: Medicare PPO | Source: Ambulatory Visit | Attending: Otolaryngology | Admitting: Otolaryngology

## 2020-10-06 ENCOUNTER — Other Ambulatory Visit: Payer: Self-pay

## 2020-10-06 DIAGNOSIS — H93A2 Pulsatile tinnitus, left ear: Secondary | ICD-10-CM | POA: Diagnosis not present

## 2020-10-06 DIAGNOSIS — H9312 Tinnitus, left ear: Secondary | ICD-10-CM

## 2020-10-06 DIAGNOSIS — R519 Headache, unspecified: Secondary | ICD-10-CM | POA: Insufficient documentation

## 2020-10-06 MED ORDER — GADOBUTROL 1 MMOL/ML IV SOLN
6.0000 mL | Freq: Once | INTRAVENOUS | Status: AC | PRN
  Administered 2020-10-06: 7.5 mL via INTRAVENOUS

## 2020-10-12 ENCOUNTER — Encounter: Payer: Self-pay | Admitting: Nurse Practitioner

## 2020-10-12 ENCOUNTER — Other Ambulatory Visit: Payer: Self-pay

## 2020-10-12 ENCOUNTER — Ambulatory Visit: Payer: Medicare PPO | Admitting: Nurse Practitioner

## 2020-10-12 VITALS — BP 100/70 | HR 70 | Ht 63.0 in | Wt 140.0 lb

## 2020-10-12 DIAGNOSIS — E782 Mixed hyperlipidemia: Secondary | ICD-10-CM | POA: Diagnosis not present

## 2020-10-12 DIAGNOSIS — I471 Supraventricular tachycardia: Secondary | ICD-10-CM | POA: Diagnosis not present

## 2020-10-12 DIAGNOSIS — R55 Syncope and collapse: Secondary | ICD-10-CM | POA: Diagnosis not present

## 2020-10-12 NOTE — Progress Notes (Signed)
Office Visit    Patient Name: Alison Bradshaw Date of Encounter: 10/12/2020  Primary Care Provider:  Baxter Hire, MD Primary Cardiologist:  Kathlyn Sacramento, MD  Chief Complaint    80 y/o ?  With a history of atypical chest pain, nonobstructive CAD, PSVT, hyperlipidemia, and GERD, who presents for follow-up of palpitations and recent monitoring.  Past Medical History    Past Medical History:  Diagnosis Date   Anxiety    Asthma    Atypical chest pain    Barrett's esophagus    Chronic gastritis    Diverticulosis    Duodenal ulcer    Gastritis    GERD (gastroesophageal reflux disease)    Hiatal hernia    History of echocardiogram    a. 08/2020 Echo: EF 60-65%, no rwma, Nl RV fxn, mild MR.   Hyperlipidemia    Non-obstructive CAD (coronary artery disease)    a. 10/2017 Stress Echo: Nl EF, no ischemia. Mild MR; b. 12/2017 Cardiac CTA: LM nl, LAD 50p, 30d, D1/2 nl, RI nl, LCX nl, OM1/2 nl, RCA nl, RPDA/RPL nl. Ca2+ score = 39 (46th percentile)-->statin dose escalated.   Osteopenia    PSVT (paroxysmal supraventricular tachycardia) ([place])    a. 10/2017 Holter: PSVT, max 5 beats->did not tolerate beta blocker; b. 09/2020 Zio: 38 runs of SVT. Longest 18.1 secs (106), fastest 203 bpm (6 beats). Occas PVCs (1.1%) - some assoc w/ triggered events.   Pulmonary nodules    a. 12/2017 RLL 2mm nodule noted on Cardiac CTA; b. 04/2018 RLL 72mm nodule resolved; c. 09/2018 CT Chest: Previously noted posterior RUL nodules almost completely resolved. Bandlike scarring of RML and lingula-->consistent w/ improved atypical infxt.   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   Past Surgical History:  Procedure Laterality Date   BREAST BIOPSY Left 2008   neg   BREAST EXCISIONAL BIOPSY Left 2004   neg   CATARACT EXTRACTION W/ INTRAOCULAR LENS  IMPLANT, BILATERAL Bilateral    COLONOSCOPY WITH PROPOFOL N/A 01/22/2017    Procedure: COLONOSCOPY WITH PROPOFOL;  Surgeon: Toledo, Benay Pike, MD;  Location: [facility] ENDOSCOPY;  Service: Gastroenterology;  Laterality: N/A;   ESOPHAGOGASTRODUODENOSCOPY (EGD) WITH PROPOFOL N/A 01/20/2015   Procedure: ESOPHAGOGASTRODUODENOSCOPY (EGD) WITH PROPOFOL;  Surgeon: Lollie Sails, MD;  [facility]COPY;  Service: Endoscopy;  Laterality: N/A;   ESOPHAGOGASTRODUODENOSCOPY (EGD) WITH PROPOFOL N/A 01/22/2017   Procedure: ESOPHAGOGASTRODUODENOSCOPY (EGD) WITH PROPOFOL;  Surgeon: Toledo, Benay Pike, MD;  Location: [facility] ENDOSCOPY;  Service: Gastroenterology;  Laterality: N/A;   ESOPHAGOGASTRODUODENOSCOPY (EGD) WITH PROPOFOL N/A 04/14/2018   Procedure: ESOPHAGOGASTRODUODENOSCOPY (EGD) WITH PROPOFOL;  Surgeon: Lollie Sails, MD;  Location: [facility] ENDOSCOPY;  Service: Endoscopy;  Laterality: N/A;   EYE SURGERY Right    laser scraping   OPEN REDUCTION INTERNAL FIXATION (ORIF) DISTAL RADIAL FRACTURE Right 02/09/2018   Procedure: OPEN REDUCTION INTERNAL FIXATION (ORIF) DISTAL RADIAL FRACTURE;  Surgeon: Hessie Knows, MD;  Location: [facility] ORS;  Service: Orthopedics;  Laterality: Right;   TONSILLECTOMY      Allergies  Allergies  Allergen Reactions   Percocet [Oxycodone-Acetaminophen] Nausea Only   Ceftin [Cefuroxime Axetil] Hives and Rash   Timentin [Ticarcillin-Pot Clavulanate] Hives and Rash    History of Present Illness    79 year old female with the above past medical history including atypical chest pain, nonobstructive CAD, PSVT, hyperlipidemia, and GERD.  She was previously evaluated in 2019 for atypical chest pain  with echocardiogram showing normal LV function.  Coronary CT angiography in September 2019 showed a calcium score of 39 with mild nonobstructive CAD in the LAD distribution.  At the time, she also reported palpitations and Holter monitoring showed short runs of SVT.  She tried taking a beta-blocker but this was discontinued secondary to complaints of dizziness.  More  recently, she was evaluated in April of this year with complaints of palpitations and presyncope.  Echocardiogram in May showed an EF of 60 to 65% without regional wall motion abnormalities, normal RV function, and mild mitral regurgitation.  A Zio monitor was worn in June, showing 38 runs of SVT the longest of which was 18.1 seconds at 106 bpm, while the fastest was 203 bpm x 6 beats.  She was noted to have occasional PVCs with a 1.1% burden as well.  Recommendation was made to consider low-dose metoprolol however patient preferred to be seen first.  Since her last visit, she has not had any recurrent presyncope.  She occasionally notes a palpitation but this is generally brief and rare.  We reviewed her monitoring report in detail today and noted that she did not have any symptomatic SVT but did seem to have several symptomatic isolated PVCs during the monitoring period.  She was reassured by the findings and wishes to defer initiation of beta-blocker or calcium channel blocker therapy at this time, especially in light of baseline soft blood pressures at 100/70.  She denies chest pain, dyspnea, PND, orthopnea, syncope, edema, or early satiety.  She continues to deal with the death of her husband.  Home Medications    Prior to Admission medications   Medication Sig Start Date End Date Taking? Authorizing Provider  acetaminophen (TYLENOL) 500 MG tablet Take 1,000 mg by mouth every 6 (six) hours as needed.   Yes [provider]  albuterol (PROVENTIL HFA;VENTOLIN HFA) 108 (90 Base) MCG/ACT inhaler Inhale 2 puffs into the lungs every 6 (six) hours as needed for shortness of breath. 05/15/18  Yes Wilhelmina Mcardle, MD  ALPRAZolam Duanne Moron) 0.25 MG tablet Take 0.25 mg by mouth 2 (two) times daily as needed for anxiety.   Yes [provider]  azelastine (ASTELIN) 0.1 % nasal spray Place 1 spray into the nose in the morning and at bedtime. 11/18/19  Yes [provider]  Fluticasone-Salmeterol  (AIRDUO RESPICLICK 626/94) 854-62 MCG/ACT AEPB Inhale 1 puff into the lungs 2 (two) times daily. 11/14/17  Yes Wilhelmina Mcardle, MD  pantoprazole (PROTONIX) 40 MG tablet Take 40 mg by mouth daily.   Yes [provider]  Probiotic Product ([place]) Take 1 capsule by mouth daily.   Yes [provider]  rosuvastatin (CRESTOR) 10 MG tablet Take 10 mg by mouth daily. 11/27/18  Yes [provider]  sucralfate (CARAFATE) 1 G tablet Take 1 g by mouth 4 (four) times daily -  with meals and at bedtime. Pt only takes when she's really needs it which isn't very often   Yes [provider]    Review of Systems    Occasional, rare/brief palpitation but overall doing well.  She denies chest pain, dyspnea, PND, orthopnea, syncope, edema, or early satiety.  All other systems reviewed and are otherwise negative except as noted above.  Physical Exam    VS:  BP 100/70 (BP Location: Left Arm, Patient Position: Sitting, Cuff Size: Normal)   Pulse 70   Ht 5\' 3"  (1.6 m)   Wt 140 lb (63.5  kg)   SpO2 97%   BMI 24.80 kg/m  , BMI Body mass index is 24.8 kg/m. GEN: Well nourished, well developed, in no acute distress. HEENT: normal. Neck: Supple, no JVD, carotid bruits, or masses. Cardiac: RRR, no murmurs, rubs, or gallops. No clubbing, cyanosis, edema.  Radials/DP/PT 2+ and equal bilaterally.  Respiratory:  Respirations regular and unlabored, clear to auscultation bilaterally. GI: Soft, nontender, nondistended, BS + x 4. MS: no deformity or atrophy. Skin: warm and dry, no rash. Neuro:  Strength and sensation are intact. Psych: Normal affect.  Accessory Clinical Findings     Lab Results  Component Value Date   WBC 5.2 07/31/2020   HGB 13.2 07/31/2020   HCT 38.4 07/31/2020   MCV 87.1 07/31/2020   PLT 194 07/31/2020   Lab Results  Component Value Date   CREATININE 0.83 07/31/2020   BUN 11 07/31/2020   NA 135 07/31/2020   K 4.3 07/31/2020   CL 100  07/31/2020   CO2 26 07/31/2020   Lab Results  Component Value Date   ALT 13 03/16/2019   AST 24 03/16/2019   ALKPHOS 77 03/16/2019   BILITOT 0.6 03/16/2019    Assessment & Plan    1.  PSVT/presyncope: Patient recently wore a Zio monitor in the setting of palpitations and presyncope.  She was noted to have 38 relatively brief runs of SVT.  Based on triggered report, these were largely asymptomatic.  Triggered events were more likely to be associated with either isolated PVCs or sinus rhythm.  We reviewed her report in detail today and she felt reassured by the benign nature of the findings.  She wishes to avoid AV nodal blocking agent given prior history of fatigue and dizziness on metoprolol.  We will continue conservative management.  As at least 2 of her 4 previously reported presyncopal episodes occurred with prolonged standing, I have encouraged her to hydrate appropriately and potentially increase her salt intake in the setting of soft blood pressures.  2.  History of atypical chest pain and dyspnea: Nonobstructive coronary disease on CTA in 2019.  Recent echo with normal LV and RV function.  No recent recurrent symptoms.  3.  Hyperlipidemia: She remains on rosuvastatin therapy.  4.  Disposition: Follow-up in 6 months or sooner if necessary.   Murray Hodgkins, NP 10/12/2020, 12:38 PM

## 2020-10-12 NOTE — Patient Instructions (Signed)
Medication Instructions:  No changes at this time.  *If you need a refill on your cardiac medications before your next appointment, please call your pharmacy*   Lab Work: None  If you have labs (blood work) drawn today and your tests are completely normal, you will receive your results only by: [place] (if you have MyChart) OR A paper copy in the mail If you have any lab test that is abnormal or we need to change your treatment, we will call you to review the results.   Testing/Procedures: None    Follow-Up: At [facility], you and your health needs are our priority.  As part of our continuing mission to provide you with exceptional heart care, we have created designated Provider Care Teams.  These Care Teams include your primary Cardiologist (physician) and Advanced Practice Providers (APPs -  Physician Assistants and Nurse Practitioners) who all work together to provide you with the care you need, when you need it.   Your next appointment:   6 month(s)  The format for your next appointment:   In Person  Provider:   Kathlyn Sacramento, MD or Murray Hodgkins, NP

## 2021-01-03 ENCOUNTER — Ambulatory Visit: Payer: Medicare PPO | Admitting: Physical Therapy

## 2021-01-04 ENCOUNTER — Encounter: Payer: Self-pay | Admitting: Physical Therapy

## 2021-01-04 ENCOUNTER — Ambulatory Visit: Payer: Medicare PPO | Attending: Sports Medicine | Admitting: Physical Therapy

## 2021-01-04 DIAGNOSIS — G8929 Other chronic pain: Secondary | ICD-10-CM | POA: Insufficient documentation

## 2021-01-04 DIAGNOSIS — M25511 Pain in right shoulder: Secondary | ICD-10-CM | POA: Insufficient documentation

## 2021-01-04 NOTE — Therapy (Signed)
[facility] [address], [place], [address] Phone: [phone]   Fax:  [phone]  Physical Therapy Evaluation  Patient Details  Name: Alison Bradshaw MRN: [id number] Date of Birth: 1940/07/09 Referring Provider (PT): acute pain of right shoulder, impingement syndrome of right shoulder, biceps tendinitis of right upper extremity, arthritis of right acromioclavicular joint.  Encounter Date: 01/04/2021   PT End of Session - 01/04/21 1529     Visit Number 1    Number of Visits 24    Date for PT Re-Evaluation 03/29/21    Authorization Type [employer]IC[employer] reporting period from 01/04/2021    Authorization - Visit Number 1    Authorization - Number of Visits 1    Progress Note Due on Visit 10    PT Start Time 1300    PT Stop Time 1345    PT Time Calculation (min) 45 min    Activity Tolerance Patient tolerated treatment well    Behavior During Therapy WFL for tasks assessed/performed             Past Medical History:  Diagnosis Date   Anxiety    Asthma    Atypical chest pain    Barrett's esophagus    Chronic gastritis    Diverticulosis    Duodenal ulcer    Gastritis    GERD (gastroesophageal reflux disease)    Hiatal hernia    History of echocardiogram    a. 08/2020 Echo: EF 60-65%, no rwma, Nl RV fxn, mild MR.   Hyperlipidemia    Non-obstructive CAD (coronary artery disease)    a. 10/2017 Stress Echo: Nl EF, no ischemia. Mild MR; b. 12/2017 Cardiac CTA: LM nl, LAD 50p, 30d, D1/2 nl, RI nl, LCX nl, OM1/2 nl, RCA nl, RPDA/RPL nl. Ca2+ score = 39 (46th percentile)-->statin dose escalated.   Osteopenia    PSVT (paroxysmal supraventricular tachycardia) ([place])    a. 10/2017 Holter: PSVT, max 5 beats->did not tolerate beta blocker; b. 09/2020 Zio: 38 runs of SVT. Longest 18.1 secs (106), fastest 203 bpm (6 beats). Occas PVCs (1.1%) - some assoc w/ triggered events.   Pulmonary nodules    a. 12/2017 RLL  58mm nodule noted on Cardiac CTA; b. 04/2018 RLL 4mm nodule resolved; c. 09/2018 CT Chest: Previously noted posterior RUL nodules almost completely resolved. Bandlike scarring of RML and lingula-->consistent w/ improved atypical infxt.   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    Past Surgical History:  Procedure Laterality Date   BREAST BIOPSY Left 2008   neg   BREAST EXCISIONAL BIOPSY Left 2004   neg   CATARACT EXTRACTION W/ INTRAOCULAR LENS  IMPLANT, BILATERAL Bilateral    COLONOSCOPY WITH PROPOFOL N/A 01/22/2017   Procedure: COLONOSCOPY WITH PROPOFOL;  Surgeon: Toledo, Benay Pike, MD;  Location: [facility] ENDOSCOPY;  Service: Gastroenterology;  Laterality: N/A;   ESOPHAGOGASTRODUODENOSCOPY (EGD) WITH PROPOFOL N/A 01/20/2015   Procedure: ESOPHAGOGASTRODUODENOSCOPY (EGD) WITH PROPOFOL;  Surgeon: Lollie Sails, MD;  Location: [facility] ENDOSCOPY;  Service: Endoscopy;  Laterality: N/A;   ESOPHAGOGASTRODUODENOSCOPY (EGD) WITH PROPOFOL N/A 01/22/2017   Procedure: ESOPHAGOGASTRODUODENOSCOPY (EGD) WITH PROPOFOL;  Surgeon: Toledo, Benay Pike, MD;  Location: [facility] ENDOSCOPY;  Service: Gastroenterology;  Laterality: N/A;   ESOPHAGOGASTRODUODENOSCOPY (EGD) WITH PROPOFOL N/A 04/14/2018   Procedure: ESOPHAGOGASTRODUODENOSCOPY (EGD) WITH PROPOFOL;  Surgeon: Lollie Sails, MD;  Location: [facility] ENDOSCOPY;  Service: Endoscopy;  Laterality: N/A;  EYE SURGERY Right    laser scraping   OPEN REDUCTION INTERNAL FIXATION (ORIF) DISTAL RADIAL FRACTURE Right 02/09/2018   Procedure: OPEN REDUCTION INTERNAL FIXATION (ORIF) DISTAL RADIAL FRACTURE;  Surgeon: Hessie Knows, MD;  Location: [facility] ORS;  Service: Orthopedics;  Laterality: Right;   TONSILLECTOMY      There were no vitals filed for this visit.    Subjective Assessment - 01/04/21 1303     Subjective Patient states condition started about 2 years ago when she started noticing  pain in her right shoulder with movement. She mostly didn't notice it but then it would be aggravated. It is now more consistent now, hurting every time she moves it. It does bother her at night where she is aware sometimes when she is side sleeping. She states she is awakened sometimes and finds her shoulder is achy. This usually happens when she is sleeping on her side when her right arm is up. She went to Dr. Candelaria Stagers because she didn't want to have an untreated injury. She has been playing pickleball for about 3 months now that doesn't seem to make it better or worse. She states over the last 2-3 years she has done a lot of pulling her husband up and that was a new strain for her. She no longer needs to do this since he passed about a year ago. States her peripheral vision is odd (wears glasses for this) and occasionally bumps into things. State she would like to return to yoga (has not been able to because of fear of putting weight through R wrist).    Pertinent History Patient is a 80 y.o. female who presents to outpatient physical therapy with a referral for medical diagnosis acute pain of right shoulder, impingement syndrome of right shoulder, biceps tendinitis of right upper extremity, arthritis of right acromioclavicular joint. This patient's chief complaints consist of right lateral shoulder pain with activity with discomfort in upper trap region leading to the following functional deficits: Difficulty with activities using R UE such as reaching, dressing, bathing, sleeping, getting out of the tub/shower while pulling on bar with R UE, vacuuming, using a blower, sweeping, grooming, reaching in her car, carrying groceries, closing car hatch, walking dog. Avoids use of right arm. .  Relevant past medical history and comorbidities include right wrist fracture with ORIF, left face numbness (at time), left ear/hearing problem (vaccine reaction, improving, followed by neurology), R knee pain, osteopenia,  barrett's esophagus, anxiety,  former smoker, GERD, hiatal hernia, hyperlipidemia, CAD, overactive digestive system, reduced peripheral vision.  Patient denies hx of cancer, stroke, seizures, lung problem besides seasonal asthma, major cardiac events (except irregular heart beat at times - followed by cardiologist), diabetes, unexplained weight loss, changes in bowel or bladder problems, new onset stumbling or dropping things, neck problems, spinal surgery.    Limitations Lifting;House hold activities;Other (comment)   ctivities using R UE such as reaching, dressing, bathing, sleeping, getting out of the tub/shower while pulling on bar with R UE, vacuuming, using a blower, sweeping, grooming, reaching in her car, carrying groceries, closing car hatch, walking dog   Diagnostic tests Right shoulder xray report 11/23/2020: "FINDINGS:     Type II acromion.  Mild-moderate AC joint degenerative change with joint   space narrowing, osteophyte formation.  The distal clavicle, scapula, and   proximal humerus are intact.  Glenohumeral and acromioclavicular joint   alignment appears normal.  No fractures or dislocations.  Well maintained   glenohumeral joint space without evidence of degenerative  joint disease.     No soft tissue swelling or joint effusion.  No destructive bony lesion is   identified."    Patient Stated Goals would like to know how to help herself either heal or cope with her problem soas not to make it worse or irritated.    Currently in Pain? No/denies    Pain Score 0-No pain   W: 5/36 with certain movements; B: 0/10 (no movement)   Pain Location Shoulder    Pain Orientation Right;Lateral    Pain Descriptors / Indicators Aching;Sharp    Pain Radiating Towards right upper trap and towards upper thoracic spine. Denies numbness or tingling except left side of face that seemed to unrelated.    Pain Onset More than a month ago    Pain Frequency Intermittent    Aggravating Factors  use of R UE, lifting  arm, moving into horizontal abduction, taking her shirt off, lifting, holding things or pulling things when doing daily activities.    Pain Relieving Factors not moving arm, massaging the area, tylenol, cold compressess, voltaren gel, hot shower.    Effect of Pain on Daily Activities Functional Limitations: activities using R UE, reaching, dressing, bathing, sleeping, getting out of the tub/shower while pulling on bar with R UE, vacuuming, using a blower, sweeping, grooming, reaching in her car, carrying groceries, closing car hatch. Avoids use of right arm.               [facility] PT Assessment - 01/04/21 0001       Assessment   Medical Diagnosis Rosalia Hammers, DO    Referring Provider (PT) acute pain of right shoulder, impingement syndrome of right shoulder, biceps tendinitis of right upper extremity, arthritis of right acromioclavicular joint.    Onset Date/Surgical Date --   about 2 years ago   Hand Dominance Right    Prior Therapy none for this problem prior to current episode of care      Balance Screen   Has the patient fallen in the past 6 months No    Has the patient had a decrease in activity level because of a fear of falling?  No    Is the patient reluctant to leave their home because of a fear of falling?  No      Home Environment   Living Environment --   no concerns about getting around home safely except her peripheral vision is odd     Prior Function   Level of Independence Independent    Vocation Requirements retired Art therapist    Leisure pickleball, piano, interain and teach, games with freinds, hiking, reading,exploring, traveling, blind dog      Cognition   Overall Cognitive Status Within Functional Limits for tasks assessed             OBJECTIVE  SELF- REPORTED FUNCTION FOTO score: 41/100 (shoulder questionnaire)  OBSERVATION/INSPECTION Posture Posture (seated/standing): increased kphosis at CT junction, forward head.  Anthropometrics Tremor:  none Body composition: BMI 25.2 Muscle bulk: WFL as expected for age/gender Functional Mobility Transfers: sit <> stand WFL Gait: grossly WFL for household and short community ambulation. More detailed gait analysis deferred to later date as needed.   SPINE SCREEN Cervical Spine AROM with OP did not provoke R lateral shoulder pain in all directions. Flexion, extension and R side-bending provoked some radiation from CT junction over R UT.   CERVICAL SPINE SPECIAL TESTS Cervical spine axial compression: right UT pain, not concordant Spurling's part B:  R = right UT pain, not concordant, L = negative   PERIPHERAL JOINT MOTION (in degrees)  Active Range of Motion (AROM) *Indicates pain 01/04/21 Date Date  Joint/Motion R/L R/L R/L  Shoulder     Flexion 160*/165 / /  Extension 52/60 / /  Abduction  162*/170 / /  External rotation 100*/100 / /  Internal rotation T10*/T7 / /  Comments:  01/04/2021: B elbow and wrists WFL, except slight stiffness noted at R wrist all directions.   MUSCLE PERFORMANCE (MMT):  *Indicates pain 01/04/21 Date Date  Joint/Motion R/L R/L R/L  Shoulder     Flexion 4*/4 / /  Abduction (C5) 4+*/4+ / /  External rotation 4+/4+ / /  Internal rotation 5/5 / /  Elbow     Flexion (C6) 5/5 / /  Extension (C7) 4+/4+ / /    PALPATION: TTP R UT and along spine, posterior rotator cuff in distal region.  SPECIAL TESTS: SHOULDER SPECIAL TESTS Painful arc test: R = positive, L = negative. Drop arm test: R = negative, L = negative. Hawkins-Kennedy test: R = positive, L = negative. Infraspinatus test: R = negative, L = negative. Yourgason's: R = negative, L = negative Speed's: R = pain at top of shoulder, L = negative O'Brien's: R = negative (more pain in supination), L = negative AC shear: R = negative, L = negative  SHOULDER SPECIAL TEST CLUSTERS Rotator Cuff Tear Age > 65: positive Weakness in ER: negative Night pain: positive (All present LR+ 9.84; All  absent LR- 0.54; Litaker et al., 2000)  Rotator Cuff Tear (full thickness) Age equal or > 60: positive + Painful arc test: positive + Drop arm test: negative + Infraspinatus test: negative (All present LR+ 28.0; All absent LR- 0.09; Park et al., 2005)  Impingement + Lisbeth Ply test: positive + Painful arc test: positive + Infraspinatus test: negative (All present LR+ 10.56; All absent LR- 0.17; Park et al., 2005)   Objective measurements completed on examination: See above findings.      PT Education - 01/04/21 1529     Education Details Education on diagnosis, prognosis, POC, anatomy and physiology of current condition    Person(s) Educated Patient    Methods Explanation;Demonstration    Comprehension Verbalized understanding;Returned demonstration              PT Short Term Goals - 01/04/21 1545       PT SHORT TERM GOAL #1   Title Be independent with initial home exercise program for self-management of symptoms.    Baseline Initial HEP to be provided visit 2 (01/04/2021);    Time 2    Period Weeks    Status New    Target Date 01/18/21               PT Long Term Goals - 01/04/21 1545       PT LONG TERM GOAL #1   Title Be independent with a long-term home exercise program for self-management of symptoms.    Baseline Intial HEP to be provided at visit 2 (01/04/2021);    Time 12    Period Weeks    Status New   TARGET DATE FOR ALL LONG TERM GOALS: 03/29/2021     PT LONG TERM GOAL #2   Title Demonstrate improved FOTO to equal or greater than 61 by visit #13 to demonstrate improvement in overall condition and self-reported functional ability.    Baseline 41 (01/04/2021);    Time 12  Period Weeks    Status New      PT LONG TERM GOAL #3   Title Have full R shoulder AROM with no compensations or increase in pain in all planes except intermittent end range discomfort to improve ability  to complete valued activities such reaching, carrying, dressing,  etc.    Baseline limited and painful - see objective exam (01/04/2021);    Time 12    Period Weeks    Status New      PT LONG TERM GOAL #4   Title Reduce pain with functional activities to equal or less than 1/10 to allow patient to complete usual activities including reaching, dressing, walking the dog, etc with less difficulty.    Baseline up to 6/10 (01/04/2021);    Time 12    Period Weeks    Status New      PT LONG TERM GOAL #5   Title Improve B shoulder strength to equal or greater than 4+/5 with no increase in pain to improve patient's ability to complete functional tasks such as pulling, vacuuming, and walking the dog with less difficulty.    Baseline as low as 4/5 with pain - see objective exam (01/04/2021);    Time 12    Period Weeks    Status New      Additional Long Term Goals   Additional Long Term Goals Yes      PT LONG TERM GOAL #6   Title Complete community, work and/or recreational activities without limitation due to current condition.    Baseline Difficulty with activities using R UE such as reaching, dressing, bathing, sleeping, getting out of the tub/shower while pulling on bar with R UE, vacuuming, using a blower, sweeping, grooming, reaching in her car, carrying groceries, closing car hatch, walking dog. Avoids use of right arm (01/04/2021);    Time 12    Period Weeks    Status New                    Plan - 01/04/21 1542     Clinical Impression Statement Patient is a 80 y.o. female referred to outpatient physical therapy with a medical diagnosis of acute pain of right shoulder, impingement syndrome of right shoulder, biceps tendinitis of right upper extremity, arthritis of right acromioclavicular joint who presents with signs and symptoms consistent with R rotator cuff related pain. Patient description and pain pattern suggests involvement of supraspinatus. Test clusters for rotator cuff tear and impingement syndrome incomplete but suggest involvement of  rotator cuff without full tear. Patient also has some tenderness along the upper thoracic spine that radiates towards the upper trap with palpation and end range cervical spine positions that close the right lateral foramen. However, this pain does not reproduce concordant lateral shoulder pain and is likely an independent finding. Patient presents with significant pain, ROM, posture, muscle performance (strength/power/endurance) and activity tolerance impairments that are limiting ability to complete activities using R UE such as reaching, dressing, bathing, sleeping, getting out of the tub/shower while pulling on bar with R UE, vacuuming, using a blower, sweeping, grooming, reaching in her car, carrying groceries, closing car hatch, and walking her dog without difficulty and decreases her quality of life. Patient will benefit from skilled physical therapy intervention to address current body structure impairments and activity limitations to improve function and work towards goals set in current POC in order to return to prior level of function or maximal functional improvement.    Personal Factors and  Comorbidities Age;Comorbidity 3+;Past/Current Experience;Time since onset of injury/illness/exacerbation    Comorbidities Relevant past medical history and comorbidities include right wrist fracture with ORIF, left face numbness (at time), left ear/hearing problem (vaccine reaction, improving, followed by neurology), R knee pain, osteopenia, barrett's esophagus, anxiety,  former smoker, GERD, hiatal hernia, hyperlipidemia, CAD, overactive digestive system, reduced peripheral vision.    Examination-Activity Limitations Hygiene/Grooming;Lift;Reach Overhead;Carry;Dressing    Examination-Participation Restrictions Cleaning;Community Activity;Occupation;Yard Work   activities using R UE such as reaching, dressing, bathing, sleeping, getting out of the tub/shower while pulling on bar with R UE, vacuuming, using a  blower, sweeping, grooming, reaching in her car, carrying groceries, closing car hatch, walking dog   Stability/Clinical Decision Making Stable/Uncomplicated    Clinical Decision Making Low    Rehab Potential Good    PT Frequency 2x / week    PT Duration 12 weeks    PT Treatment/Interventions ADLs/Self Care Home Management;Cryotherapy;Electrical Stimulation;Moist Heat;Therapeutic activities;Therapeutic exercise;Neuromuscular re-education;Manual techniques;Dry needling;Passive range of motion    PT Next Visit Plan establish initial HEP, pregressive postural and shoulder girdle strenthening as tolerated, manual therapy as needed    PT Home Exercise Plan TBD    Consulted and Agree with Plan of Care Patient             Patient will benefit from skilled therapeutic intervention in order to improve the following deficits and impairments:  Pain, Increased muscle spasms, Decreased activity tolerance, Postural dysfunction, Decreased endurance, Decreased range of motion, Decreased strength, Hypomobility, Impaired perceived functional ability, Impaired UE functional use  Visit Diagnosis: Chronic right shoulder pain     Problem List Patient Active Problem List   Diagnosis Date Noted   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   Zenker's diverticulum - asymptomatic 03/27/2019   Functional dyspepsia 03/27/2019   Irritable bowel syndrome with diarrhea 03/27/2019   Alopecia 05/14/2018   Lung nodule 03/30/2018   Osteopenia of multiple sites 10/10/2016   Nausea 10/08/2016   Need for vaccination 12/28/2015   GAD (generalized anxiety disorder) 09/29/2015   Hematuria, microscopic 09/29/2015   Onychomycosis of toenail 09/29/2015   Anxiety 09/21/2014   Mixed hyperlipidemia 02/22/2014   Abnormal blood chemistry 12/25/2012   Abnormal chest CT 12/25/2012   Flushing reaction 11/19/2012   Gastroesophageal reflux disease 11/19/2012   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   Multiple gastric  polyps 11/02/2012    Clarise Cruz R. Graylon Good, PT, DPT 01/04/21, 3:51 PM  [facility] [address] [place], [address] Phone: [phone]   Fax:  [phone]  Name: Alison Bradshaw MRN: [id number] Date of Birth: March 03, 1941

## 2021-01-08 ENCOUNTER — Encounter: Payer: Self-pay | Admitting: Physical Therapy

## 2021-01-08 ENCOUNTER — Ambulatory Visit: Payer: Medicare PPO | Attending: Sports Medicine | Admitting: Physical Therapy

## 2021-01-08 DIAGNOSIS — G8929 Other chronic pain: Secondary | ICD-10-CM | POA: Insufficient documentation

## 2021-01-08 DIAGNOSIS — M25511 Pain in right shoulder: Secondary | ICD-10-CM | POA: Diagnosis not present

## 2021-01-08 NOTE — Therapy (Signed)
[facility] [address], [place], [address] Phone: [phone]   Fax:  [phone]  Physical Therapy Treatment  Patient Details  Name: Alison Bradshaw MRN: [id number] Date of Birth: 03/11/1941 Referring Provider (PT): acute pain of right shoulder, impingement syndrome of right shoulder, biceps tendinitis of right upper extremity, arthritis of right acromioclavicular joint.   Encounter Date: 01/08/2021   PT End of Session - 01/08/21 1038     Visit Number 2    Number of Visits 24    Date for PT Re-Evaluation 03/29/21    Authorization Type [employer]IC[employer] reporting period from 01/04/2021    Authorization Time Period 24 visits 10/3 - 12/30  Authorization #[id number]    Authorization - Visit Number 1    Authorization - Number of Visits 24    Progress Note Due on Visit 10    PT Start Time 1033    PT Stop Time 1113    PT Time Calculation (min) 40 min    Activity Tolerance Patient tolerated treatment well    Behavior During Therapy WFL for tasks assessed/performed             Past Medical History:  Diagnosis Date   Anxiety    Asthma    Atypical chest pain    Barrett's esophagus    Chronic gastritis    Diverticulosis    Duodenal ulcer    Gastritis    GERD (gastroesophageal reflux disease)    Hiatal hernia    History of echocardiogram    a. 08/2020 Echo: EF 60-65%, no rwma, Nl RV fxn, mild MR.   Hyperlipidemia    Non-obstructive CAD (coronary artery disease)    a. 10/2017 Stress Echo: Nl EF, no ischemia. Mild MR; b. 12/2017 Cardiac CTA: LM nl, LAD 50p, 30d, D1/2 nl, RI nl, LCX nl, OM1/2 nl, RCA nl, RPDA/RPL nl. Ca2+ score = 39 (46th percentile)-->statin dose escalated.   Osteopenia    PSVT (paroxysmal supraventricular tachycardia) ([place])    a. 10/2017 Holter: PSVT, max 5 beats->did not tolerate beta blocker; b. 09/2020 Zio: 38 runs of SVT. Longest 18.1 secs (106), fastest 203 bpm (6 beats). Occas PVCs  (1.1%) - some assoc w/ triggered events.   Pulmonary nodules    a. 12/2017 RLL 20mm nodule noted on Cardiac CTA; b. 04/2018 RLL 2mm nodule resolved; c. 09/2018 CT Chest: Previously noted posterior RUL nodules almost completely resolved. Bandlike scarring of RML and lingula-->consistent w/ improved atypical infxt.   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    Past Surgical History:  Procedure Laterality Date   BREAST BIOPSY Left 2008   neg   BREAST EXCISIONAL BIOPSY Left 2004   neg   CATARACT EXTRACTION W/ INTRAOCULAR LENS  IMPLANT, BILATERAL Bilateral    COLONOSCOPY WITH PROPOFOL N/A 01/22/2017   Procedure: COLONOSCOPY WITH PROPOFOL;  Surgeon: Toledo, Benay Pike, MD;  Location: [facility] ENDOSCOPY;  Service: Gastroenterology;  Laterality: N/A;   ESOPHAGOGASTRODUODENOSCOPY (EGD) WITH PROPOFOL N/A 01/20/2015   Procedure: ESOPHAGOGASTRODUODENOSCOPY (EGD) WITH PROPOFOL;  Surgeon: Lollie Sails, MD;  Location: [facility] ENDOSCOPY;  Service: Endoscopy;  Laterality: N/A;   ESOPHAGOGASTRODUODENOSCOPY (EGD) WITH PROPOFOL N/A 01/22/2017   Procedure: ESOPHAGOGASTRODUODENOSCOPY (EGD) WITH PROPOFOL;  Surgeon: Toledo, Benay Pike, MD;  Location: [facility] ENDOSCOPY;  Service: Gastroenterology;  Laterality: N/A;   ESOPHAGOGASTRODUODENOSCOPY (EGD) WITH PROPOFOL N/A 04/14/2018   Procedure: ESOPHAGOGASTRODUODENOSCOPY (EGD) WITH PROPOFOL;  Surgeon: Lollie Sails, MD;  Location: [facility] ENDOSCOPY;  Service: Endoscopy;  Laterality: N/A;   EYE SURGERY Right    laser scraping   OPEN REDUCTION INTERNAL FIXATION (ORIF) DISTAL RADIAL FRACTURE Right 02/09/2018   Procedure: OPEN REDUCTION INTERNAL FIXATION (ORIF) DISTAL RADIAL FRACTURE;  Surgeon: Hessie Knows, MD;  Location: [facility] ORS;  Service: Orthopedics;  Laterality: Right;   TONSILLECTOMY      There were no vitals filed for this visit.  OBJECTIVE ACCESSORY MOTION: slight radiating pain towards  left with CPA at C7, T2. CPA at T1 is very tender locally. Slight right radiation with CPA to ~T4. Questionable if she had some brief radiation towards R with CPA to C7 (did not persist with serial assessment). No reproduction of anterior/lateral R shoulder pain.  PALPATION:  - TTP with trigger point and reproduction of concordant pain with pressure to R infraspinatus.   TREATMENT:   Manual therapy: to reduce pain and tissue tension, improve range of motion, neuromodulation, in order to promote improved ability to complete functional activities. - CPA grade III-IV from C2 - T9, 3-5 reps each segment with additional reps on most tender segment or those that produced radiating pain (see objective above).  - STM to posterior R rotator cuff and periscapular muscles. Reproduction of concordant pain with sustained pressure to infraspinatus.    Therapeutic exercise: to centralize symptoms and improve ROM, strength, muscular endurance, and activity tolerance required for successful completion of functional activities.  - spine chest press with plus with 2# DB each hand, 3x10 - supine R shoulder circumduction in press with plus position with 2#DB, 2x20 each direction.  - sidelying R shoulder ER with towel roll under arm, 1x5 AROM (felt okay) 1x10 with 2#DB (felt okay), 1x8 with 2# DB with tactile cuing to stabilize capula (become painful at most IR range and had to stop), 1x10 with limited IR motion (felt better).  - sidelying R shoulder AROM abduction to 90 degrees, slow tempo, 2x10  - prone scapular retraction with hands at side, 1x20 with 5 second hold - prone scapular retraction with hands lift off in ER, 1x20 with 5 second hold. - standing scapular retraction with B shoulder extension against yellow theraband, 1x10 with 5 second hold and cuing for technique and how to anchor band.  - Education on HEP including handout   Pt required multimodal cuing for proper technique and to facilitate improved  neuromuscular control, strength, range of motion, and functional ability resulting in improved performance and form.   HOME EXERCISE PROGRAM Access Code: [email or web address] URL: [email or web address] Date: 01/08/2021 Prepared by: Rosita Kea  Exercises Supine Shoulder Circles with Weight - 1 x daily - 2 sets - 20 reps Sidelying Shoulder Abduction Palm Forward - 1 x daily - 2-3 sets - 10 reps Shoulder Extension with Resistance - Palms Forward - 1 x daily - 2-3 sets - 10 reps - 5 seconds hold   PT Education - 01/08/21 1038     Education Details exercise/intevention purpose/technique.    Person(s) Educated Patient    Methods Explanation;Demonstration;Tactile cues;Verbal cues    Comprehension Verbalized understanding;Returned demonstration;Verbal cues required;Tactile cues required;Need further instruction              PT Short Term Goals - 01/04/21 1545       PT SHORT TERM GOAL #1   Title Be independent with initial home exercise program for self-management of symptoms.    Baseline Initial HEP to be provided visit 2 (01/04/2021);  Time 2    Period Weeks    Status New    Target Date 01/18/21               PT Long Term Goals - 01/04/21 1545       PT LONG TERM GOAL #1   Title Be independent with a long-term home exercise program for self-management of symptoms.    Baseline Intial HEP to be provided at visit 2 (01/04/2021);    Time 12    Period Weeks    Status New   TARGET DATE FOR ALL LONG TERM GOALS: 03/29/2021     PT LONG TERM GOAL #2   Title Demonstrate improved FOTO to equal or greater than 61 by visit #13 to demonstrate improvement in overall condition and self-reported functional ability.    Baseline 41 (01/04/2021);    Time 12    Period Weeks    Status New      PT LONG TERM GOAL #3   Title Have full R shoulder AROM with no compensations or increase in pain in all planes except intermittent end range discomfort to improve ability  to complete valued  activities such reaching, carrying, dressing, etc.    Baseline limited and painful - see objective exam (01/04/2021);    Time 12    Period Weeks    Status New      PT LONG TERM GOAL #4   Title Reduce pain with functional activities to equal or less than 1/10 to allow patient to complete usual activities including reaching, dressing, walking the dog, etc with less difficulty.    Baseline up to 6/10 (01/04/2021);    Time 12    Period Weeks    Status New      PT LONG TERM GOAL #5   Title Improve B shoulder strength to equal or greater than 4+/5 with no increase in pain to improve patient's ability to complete functional tasks such as pulling, vacuuming, and walking the dog with less difficulty.    Baseline as low as 4/5 with pain - see objective exam (01/04/2021);    Time 12    Period Weeks    Status New      Additional Long Term Goals   Additional Long Term Goals Yes      PT LONG TERM GOAL #6   Title Complete community, work and/or recreational activities without limitation due to current condition.    Baseline Difficulty with activities using R UE such as reaching, dressing, bathing, sleeping, getting out of the tub/shower while pulling on bar with R UE, vacuuming, using a blower, sweeping, grooming, reaching in her car, carrying groceries, closing car hatch, walking dog. Avoids use of right arm (01/04/2021);    Time 12    Period Weeks    Status New                   Plan - 01/08/21 1131     Clinical Impression Statement Patient tolerated treatment well with some difficulty with painful grinding with sidelying R shoulder ER, especially into the IR range. Avoided providing this for HEP but did update HEP with well tolerated exercises addressing shoulder complex and gentle RTC tendon loading. Patient states she had difficulty with visual processing assessment in school, so multiple modes of education provided. Patient reports no increase in pain by end of session but continues to  have difficulty with use and loading of R UE. Apparent trigger point in R infraspinatus would benefit from further  manual therapy as appropriate. Patient would benefit from continued management of limiting condition by skilled physical therapist to address remaining impairments and functional limitations to work towards stated goals and return to PLOF or maximal functional independence.    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    Examination-Activity Limitations Hygiene/Grooming;Lift;Reach Overhead;Carry;Dressing    Examination-Participation Restrictions Cleaning;Community Activity;Occupation;Yard Work   activities using R UE such as reaching, dressing, bathing, sleeping, getting out of the tub/shower while pulling on bar with R UE, vacuuming, using a blower, sweeping, grooming, reaching in her car, carrying groceries, closing car hatch, walking dog   Stability/Clinical Decision Making Stable/Uncomplicated    Rehab Potential Good    PT Frequency 2x / week    PT Duration 12 weeks    PT Treatment/Interventions ADLs/Self Care Home Management;Cryotherapy;Electrical Stimulation;Moist Heat;Therapeutic activities;Therapeutic exercise;Neuromuscular re-education;Manual techniques;Dry needling;Passive range of motion    PT Next Visit Plan establish initial HEP, pregressive postural and shoulder girdle strenthening as tolerated, manual therapy as needed    PT Home Exercise Plan Medbridge Access Code: [email or web address]    Consulted and Agree with Plan of Care  Patient             Patient will benefit from skilled therapeutic intervention in order to improve the following deficits and impairments:  Pain, Increased muscle spasms, Decreased activity tolerance, Postural dysfunction, Decreased endurance, Decreased range of motion, Decreased strength, Hypomobility, Impaired perceived functional ability, Impaired UE functional use  Visit Diagnosis: Chronic right shoulder pain     Problem List Patient Active Problem List   Diagnosis Date Noted   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   Zenker's diverticulum - asymptomatic 03/27/2019   Functional dyspepsia 03/27/2019   Irritable bowel syndrome with diarrhea 03/27/2019   Alopecia 05/14/2018   Lung nodule 03/30/2018   Osteopenia of multiple sites 10/10/2016   Nausea 10/08/2016   Need for vaccination 12/28/2015   GAD (generalized anxiety disorder) 09/29/2015   Hematuria, microscopic 09/29/2015   Onychomycosis of toenail 09/29/2015   Anxiety 09/21/2014   Mixed hyperlipidemia 02/22/2014   Abnormal blood chemistry 12/25/2012   Abnormal chest CT 12/25/2012   Flushing reaction 11/19/2012   Gastroesophageal reflux disease 11/19/2012   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 Graylon Good, PT, DPT 01/08/21, 11:33 AM   [facility] [address], [place], [address] Phone: [phone]   Fax:  [phone]  Name: Alison Bradshaw MRN: [id number] Date of Birth: [birth year]

## 2021-01-10 ENCOUNTER — Ambulatory Visit: Payer: Medicare PPO | Admitting: Physical Therapy

## 2021-01-15 ENCOUNTER — Ambulatory Visit: Payer: Medicare PPO | Admitting: Physical Therapy

## 2021-01-22 ENCOUNTER — Encounter: Payer: Medicare PPO | Admitting: Physical Therapy

## 2021-01-24 ENCOUNTER — Ambulatory Visit: Payer: Medicare PPO | Admitting: Physical Therapy

## 2021-01-24 ENCOUNTER — Encounter: Payer: Self-pay | Admitting: Physical Therapy

## 2021-01-24 DIAGNOSIS — G8929 Other chronic pain: Secondary | ICD-10-CM

## 2021-01-24 DIAGNOSIS — M25511 Pain in right shoulder: Secondary | ICD-10-CM | POA: Diagnosis not present

## 2021-01-24 NOTE — Therapy (Signed)
[facility] [address], [place], [address] Phone: [phone]   Fax:  [phone]  Physical Therapy Treatment  Patient Details  Name: Alison Bradshaw MRN: [id number] Date of Birth: May 28, 1940 Referring Provider (PT): Rosalia Hammers, DO   Encounter Date: 01/24/2021   PT End of Session - 01/24/21 1612     Visit Number 3    Number of Visits 24    Date for PT Re-Evaluation 03/29/21    Authorization Type [employer]IC[employer] reporting period from 01/04/2021    Authorization Time Period 24 visits 10/3 - 12/30  Authorization #[id number]    Authorization - Visit Number 2    Authorization - Number of Visits 24    Progress Note Due on Visit 10    PT Start Time 1117    PT Stop Time 1200    PT Time Calculation (min) 43 min    Activity Tolerance Patient tolerated treatment well    Behavior During Therapy [facility] for tasks assessed/performed             Past Medical History:  Diagnosis Date   Anxiety    Asthma    Atypical chest pain    Barrett's esophagus    Chronic gastritis    Diverticulosis    Duodenal ulcer    Gastritis    GERD (gastroesophageal reflux disease)    Hiatal hernia    History of echocardiogram    a. 08/2020 Echo: EF 60-65%, no rwma, Nl RV fxn, mild MR.   Hyperlipidemia    Non-obstructive CAD (coronary artery disease)    a. 10/2017 Stress Echo: Nl EF, no ischemia. Mild MR; b. 12/2017 Cardiac CTA: LM nl, LAD 50p, 30d, D1/2 nl, RI nl, LCX nl, OM1/2 nl, RCA nl, RPDA/RPL nl. Ca2+ score = 39 (46th percentile)-->statin dose escalated.   Osteopenia    PSVT (paroxysmal supraventricular tachycardia) ([place])    a. 10/2017 Holter: PSVT, max 5 beats->did not tolerate beta blocker; b. 09/2020 Zio: 38 runs of SVT. Longest 18.1 secs (106), fastest 203 bpm (6 beats). Occas PVCs (1.1%) - some assoc w/ triggered events.   Pulmonary nodules    a. 12/2017 RLL 27mm nodule noted on Cardiac CTA; b. 04/2018 RLL 32mm nodule  resolved; c. 09/2018 CT Chest: Previously noted posterior RUL nodules almost completely resolved. Bandlike scarring of RML and lingula-->consistent w/ improved atypical infxt.   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    Past Surgical History:  Procedure Laterality Date   BREAST BIOPSY Left 2008   neg   BREAST EXCISIONAL BIOPSY Left 2004   neg   CATARACT EXTRACTION W/ INTRAOCULAR LENS  IMPLANT, BILATERAL Bilateral    COLONOSCOPY WITH PROPOFOL N/A 01/22/2017   Procedure: COLONOSCOPY WITH PROPOFOL;  Surgeon: Toledo, Benay Pike, MD;  Location: [facility] ENDOSCOPY;  Service: Gastroenterology;  Laterality: N/A;   ESOPHAGOGASTRODUODENOSCOPY (EGD) WITH PROPOFOL N/A 01/20/2015   Procedure: ESOPHAGOGASTRODUODENOSCOPY (EGD) WITH PROPOFOL;  Surgeon: Lollie Sails, MD;  Location: [facility] ENDOSCOPY;  Service: Endoscopy;  Laterality: N/A;   ESOPHAGOGASTRODUODENOSCOPY (EGD) WITH PROPOFOL N/A 01/22/2017   Procedure: ESOPHAGOGASTRODUODENOSCOPY (EGD) WITH PROPOFOL;  Surgeon: Toledo, Benay Pike, MD;  Location: [facility] ENDOSCOPY;  Service: Gastroenterology;  Laterality: N/A;   ESOPHAGOGASTRODUODENOSCOPY (EGD) WITH PROPOFOL N/A 04/14/2018   Procedure: ESOPHAGOGASTRODUODENOSCOPY (EGD) WITH PROPOFOL;  Surgeon: Lollie Sails, MD;  Location[facility] END[facility]COPY;  Service: Endoscopy;  Laterality: N/A;   EYE  SURGERY Right    laser scraping   OPEN REDUCTION INTERNAL FIXATION (ORIF) DISTAL RADIAL FRACTURE Right 02/09/2018   Procedure: OPEN REDUCTION INTERNAL FIXATION (ORIF) DISTAL RADIAL FRACTURE;  Surgeon: Hessie Knows, MD;  Location: [facility] ORS;  Service: Orthopedics;  Laterality: Right;   TONSILLECTOMY      There were no vitals filed for this visit.   Subjective Assessment - 01/24/21 1121     Subjective Patient reports she feels about the same. Sometimes her shoulder hurts and sometimes it doesn't regardless of the exercises. She does her  HEP about 4x a week. She did a lot of driving and did feel more radiation from the right upper back. Reports current pain nothing at rest, but about 2/10 with movement of her R UE, expecially taking shirt off. Pain is at the anterior superior R shoulder.    Pertinent History Patient is a 80 y.o. female who presents to outpatient physical therapy with a referral for medical diagnosis acute pain of right shoulder, impingement syndrome of right shoulder, biceps tendinitis of right upper extremity, arthritis of right acromioclavicular joint. This patient's chief complaints consist of right lateral shoulder pain with activity with discomfort in upper trap region leading to the following functional deficits: Difficulty with activities using R UE such as reaching, dressing, bathing, sleeping, getting out of the tub/shower while pulling on bar with R UE, vacuuming, using a blower, sweeping, grooming, reaching in her car, carrying groceries, closing car hatch, walking dog. Avoids use of right arm. .  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  Patient denies hx of cancer, stroke, seizures, lung problem besides seasonal asthma, major cardiac events (except irregular heart beat at times - followed by cardiologist), diabetes, unexplained weight loss, changes in bowel or bladder problems, new onset stumbling or dropping things, neck problems, spinal surgery.    Limitations Lifting;House hold activities;Other (comment)   ctivities using R UE such as reaching, dressing, bathing, sleeping, getting out of the tub/shower while pulling on bar with R UE, vacuuming, using a blower, sweeping, grooming, reaching in her  car, carrying groceries, closing car hatch, walking dog   Diagnostic tests Right shoulder xray report 11/23/2020: "FINDINGS:     Type II acromion.  Mild-moderate AC joint degenerative change with joint   space narrowing, osteophyte formation.  The distal clavicle, scapula, and   proximal humerus are intact.  Glenohumeral and acromioclavicular joint   alignment appears normal.  No fractures or dislocations.  Well maintained   glenohumeral joint space without evidence of degenerative joint disease.     No soft tissue swelling or joint effusion.  No destructive bony lesion is   identified."    Patient Stated Goals would like to know how to help herself either heal or cope with her problem soas not to make it worse or irritated.    Currently in Pain? Yes    Pain Score 2     Pain Onset More than a month ago             OBJECTIVE Functional PROM  Wrist extension  R = 80 degrees and painful. L = 100 degrees and no pain.    TREATMENT:   Therapeutic exercise: to centralize symptoms and improve ROM, strength, muscular endurance, and activity tolerance  required for successful completion of functional activities.  - supine chest press with plus with 4# DB each hand, 3x10 - supine chest press with plus using red theraband around back, 1x10 plus more reps with other band strengths to determine appropriate load.  - serratus press leaning on low plinth (to simulate coffee table) and counter to learn exercise and find appropriate load. Determined plank position on counter is tolerable and completed 1x10 reps.  - attempted CKC shoulder taps leaning on counter, then low plinth, but too painful/difficult due to R wrist stiffness/pain. Quadruped also not tolerated.  - standing closed chain R wrist extension, leaning forward gently on table, 1x10 with 5 second hold.  - measurement of wrist extension in order to measure baseline to see if progress can be made to improve R UE weight bearing for yoga and other  desired activities (see above).  - supine R shoulder circumduction in press with plus position with 4#DB, 1x20 each direction, with red theraband 1x20 each direction.  - sidelying R shoulder AROM abduction to 90 degrees, slow tempo, 1x10 AROM, with 1# DB 1x10 (Pain during movement for both but not after).  - seated B shoulder ER against yellow theraband, 1x10 - standing scapular retraction with B shoulder extension against yellow theraband, 1x10 with 5 second hold and cuing for technique and how to anchor band.  - Education on HEP including handout    Pt required multimodal cuing for proper technique and to facilitate improved neuromuscular control, strength, range of motion, and functional ability resulting in improved performance and form.    HOME EXERCISE PROGRAM Access Code: [email or web address] URL: [email or web address] Date: 01/24/2021 Prepared by: Rosita Kea  Exercises Supine Serratus Punches Resistance - 1 x daily - 3 sets - 10 reps Scapular Protraction with Table Lean - 1 x daily - 3 sets - 10 reps Sidelying Shoulder Abduction Palm Forward - 1 x daily - 2-3 sets - 10 reps Shoulder External Rotation with Resistance - 1 x daily - 3 sets - 10 reps Shoulder Extension with Resistance - Palms Forward - 1 x daily - 2-3 sets - 10 reps - 5 seconds hold  [email or web address]   WRIST EXTENSION STRETCH - TABLE -  Repeat 20 Times, Hold 5 Seconds, Complete 1 Set, Perform 2 Times a Day   PT Education - 01/24/21 1612     Education Details exercise/intevention purpose/technique.    Person(s) Educated Patient    Methods Explanation;Demonstration;Tactile cues;Verbal cues;Handout    Comprehension Verbalized understanding;Returned demonstration;Verbal cues required;Tactile cues required;Need further instruction              PT Short Term Goals - 01/04/21 1545       PT SHORT TERM GOAL #1   Title Be independent with initial home exercise program for self-management of symptoms.     Baseline Initial HEP to be provided visit 2 (01/04/2021);    Time 2    Period Weeks    Status New    Target Date 01/18/21               PT Long Term Goals - 01/04/21 1545       PT LONG TERM GOAL #1   Title Be independent with a long-term home exercise program for self-management of symptoms.    Baseline Intial HEP to be provided at visit 2 (01/04/2021);    Time 12    Period Weeks    Status New   TARGET DATE FOR ALL LONG TERM GOALS: 03/29/2021  PT LONG TERM GOAL #2   Title Demonstrate improved FOTO to equal or greater than 61 by visit #13 to demonstrate improvement in overall condition and self-reported functional ability.    Baseline 41 (01/04/2021);    Time 12    Period Weeks    Status New      PT LONG TERM GOAL #3   Title Have full R shoulder AROM with no compensations or increase in pain in all planes except intermittent end range discomfort to improve ability  to complete valued activities such reaching, carrying, dressing, etc.    Baseline limited and painful - see objective exam (01/04/2021);    Time 12    Period Weeks    Status New      PT LONG TERM GOAL #4   Title Reduce pain with functional activities to equal or less than 1/10 to allow patient to complete usual activities including reaching, dressing, walking the dog, etc with less difficulty.    Baseline up to 6/10 (01/04/2021);    Time 12    Period Weeks    Status New      PT LONG TERM GOAL #5   Title Improve B shoulder strength to equal or greater than 4+/5 with no increase in pain to improve patient's ability to complete functional tasks such as pulling, vacuuming, and walking the dog with less difficulty.    Baseline as low as 4/5 with pain - see objective exam (01/04/2021);    Time 12    Period Weeks    Status New      Additional Long Term Goals   Additional Long Term Goals Yes      PT LONG TERM GOAL #6   Title Complete community, work and/or recreational activities without limitation due to current  condition.    Baseline Difficulty with activities using R UE such as reaching, dressing, bathing, sleeping, getting out of the tub/shower while pulling on bar with R UE, vacuuming, using a blower, sweeping, grooming, reaching in her car, carrying groceries, closing car hatch, walking dog. Avoids use of right arm (01/04/2021);    Time 12    Period Weeks    Status New                   Plan - 01/24/21 1609     Clinical Impression Statement Patient tolerated treatment well overall and reported some pain during movement that resolved with rest. Patient is limited in R UE weight bearing by restriction in R wrist extension and wrist tolerance to weight bearing. Provided intervention to attempt to address this to improve patient's ability to participate in exercises for R shoulder pain and to help her towards her goal of returning to yoga. Patient would benefit from [facility] to right posterior rotator cuff next session in addition to exercise. Patient would benefit from continued management of limiting condition by skilled physical therapist to address remaining impairments and functional limitations to work towards stated goals and return to PLOF or maximal functional independence.    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    Examination-Activity Limitations Hygiene/Grooming;Lift;Reach Overhead;Carry;Dressing    Examination-Participation Restrictions Cleaning;Community Activity;Occupation;[employer] Work   activities using  R UE such as reaching,  dressing, bathing, sleeping, getting out of the tub/shower while pulling on bar with R UE, vacuuming, using a blower, sweeping, grooming, reaching in her car, carrying groceries, closing car hatch, walking dog   Stability/Clinical Decision Making Stable/Uncomplicated    Rehab Potential Good    PT Frequency 2x / week    PT Duration 12 weeks    PT Treatment/Interventions ADLs/Self Care Home Management;Cryotherapy;Electrical Stimulation;Moist Heat;Therapeutic activities;Therapeutic exercise;Neuromuscular re-education;Manual techniques;Dry needling;Passive range of motion    PT Next Visit Plan update HEP as appropriate,  pregressive postural and shoulder girdle strenthening as tolerated, manual therapy as needed    PT [place] Access Code: [email or web address]     [email or web address] [email or web address]    Consulted and Agree with Plan of Care Patient             Patient will benefit from skilled therapeutic intervention in order to improve the following deficits and impairments:  Pain, Increased muscle spasms, Decreased activity tolerance, Postural dysfunction, Decreased endurance, Decreased range of motion, Decreased strength, Hypomobility, Impaired perceived functional ability, Impaired UE functional use  Visit Diagnosis: Chronic right shoulder pain     Problem List Patient Active Problem List   Diagnosis Date Noted   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   Zenker's diverticulum - asymptomatic 03/27/2019   Functional dyspepsia 03/27/2019   Irritable bowel syndrome with diarrhea 03/27/2019   Alopecia 05/14/2018   Lung nodule 03/30/2018   Osteopenia of multiple sites 10/10/2016   Nausea 10/08/2016   Need for vaccination 12/28/2015   GAD (generalized anxiety disorder) 09/29/2015   Hematuria, microscopic 09/29/2015   Onychomycosis of toenail 09/29/2015   Anxiety 09/21/2014   Mixed hyperlipidemia 02/22/2014   Abnormal blood chemistry 12/25/2012   Abnormal chest CT 12/25/2012    Flushing reaction 11/19/2012   Gastroesophageal reflux disease 11/19/2012   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 Graylon Good, PT, DPT 01/24/21, 4:13 PM   [facility] [address] [place], [address] Phone: [phone]   Fax:  [phone]  Name: Kenyonna Micek MRN: [id number] Date of Birth: [birth year]

## 2021-01-29 ENCOUNTER — Ambulatory Visit: Payer: Medicare PPO | Admitting: Physical Therapy

## 2021-01-29 DIAGNOSIS — M25511 Pain in right shoulder: Secondary | ICD-10-CM | POA: Diagnosis not present

## 2021-01-29 DIAGNOSIS — G8929 Other chronic pain: Secondary | ICD-10-CM

## 2021-01-29 NOTE — Therapy (Signed)
[facility] [address], [place], [address] Phone: [phone]   Fax:  [phone]  Physical Therapy Treatment  Patient Details  Name: Clois Montavon MRN: [id number] Date of Birth: 1941-04-03 Referring Provider (PT): Rosalia Hammers, DO   Encounter Date: 01/29/2021   PT End of Session - 01/29/21 1132     Visit Number 4    Number of Visits 24    Date for PT Re-Evaluation 03/29/21    Authorization Type [employer]IC[employer] reporting period from 01/04/2021    Authorization Time Period 24 visits 10/3 - 12/30  Authorization #[id number]    Authorization - Visit Number 3    Authorization - Number of Visits 24    Progress Note Due on Visit 10    PT Start Time 1123    PT Stop Time 1201    PT Time Calculation (min) 38 min    Activity Tolerance Patient tolerated treatment well    Behavior During Therapy [facility] for tasks assessed/performed             Past Medical History:  Diagnosis Date   Anxiety    Asthma    Atypical chest pain    Barrett's esophagus    Chronic gastritis    Diverticulosis    Duodenal ulcer    Gastritis    GERD (gastroesophageal reflux disease)    Hiatal hernia    History of echocardiogram    a. 08/2020 Echo: EF 60-65%, no rwma, Nl RV fxn, mild MR.   Hyperlipidemia    Non-obstructive CAD (coronary artery disease)    a. 10/2017 Stress Echo: Nl EF, no ischemia. Mild MR; b. 12/2017 Cardiac CTA: LM nl, LAD 50p, 30d, D1/2 nl, RI nl, LCX nl, OM1/2 nl, RCA nl, RPDA/RPL nl. Ca2+ score = 39 (46th percentile)-->statin dose escalated.   Osteopenia    PSVT (paroxysmal supraventricular tachycardia) ([place])    a. 10/2017 Holter: PSVT, max 5 beats->did not tolerate beta blocker; b. 09/2020 Zio: 38 runs of SVT. Longest 18.1 secs (106), fastest 203 bpm (6 beats). Occas PVCs (1.1%) - some assoc w/ triggered events.   Pulmonary nodules    a. 12/2017 RLL 36mm nodule noted on Cardiac CTA; b. 04/2018 RLL 62mm nodule  resolved; c. 09/2018 CT Chest: Previously noted posterior RUL nodules almost completely resolved. Bandlike scarring of RML and lingula-->consistent w/ improved atypical infxt.   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 ENDOSCOPY;  Service: Endoscopy;  Laterality: N/A;   ESOPHAGOGASTRODUODENOSCOPY (EGD) WITH PROPOFOL N/A 01/22/2017   Procedure: ESOPHAGOGASTRODUODENOSCOPY (EGD) WITH PROPOFOL;  Surgeon: Toledo, Benay Pike, MD;  Location: [facility] ENDOSCOPY;  Service: Gastroenterology;  Laterality: N/A;   ESOPHAGOGASTRODUODENOSCOPY (EGD) WITH PROPOFOL N/A 04/14/2018   Procedure: ESOPHAGOGASTRODUODENOSCOPY (EGD) WITH PROPOFOL;  Surgeon: Lollie Sails, MD;  Location: [facility] ENDOSCOPY;  Service: Endoscopy;  Laterality: N/A;   EYE  SURGERY Right    laser scraping   OPEN REDUCTION INTERNAL FIXATION (ORIF) DISTAL RADIAL FRACTURE Right 02/09/2018   Procedure: OPEN REDUCTION INTERNAL FIXATION (ORIF) DISTAL RADIAL FRACTURE;  Surgeon: Hessie Knows, MD;  Location: [facility] ORS;  Service: Orthopedics;  Laterality: Right;   TONSILLECTOMY      There were no vitals filed for this visit.   Subjective Assessment - 01/29/21 1125     Subjective Patient reports she is feeling well today with decreased pain during her funcitonal activities throughout the day but some increased pain at  night. Reports her pain is currently 1/10 in the right shoulder. States she lost her bands after having a household of adults and children yesterday. Reports her shoulder feels better when she rubs it. Reports wrist is doing well and thinks the stretches are helping. States she is doing her HEP and has no questions about it.    Pertinent History Patient is a 80 y.o. female who presents to outpatient physical therapy with a referral for medical diagnosis acute pain of right shoulder, impingement syndrome of right shoulder, biceps tendinitis of right upper extremity, arthritis of right acromioclavicular joint. This patient's chief complaints consist of right lateral shoulder pain with activity with discomfort in upper trap region leading to the following functional deficits: Difficulty with activities using R UE such as reaching, dressing, bathing, sleeping, getting out of the tub/shower while pulling on bar with R UE, vacuuming, using a blower, sweeping, grooming, reaching in her car, carrying groceries, closing car hatch, walking dog. Avoids use of right arm. .  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  Patient denies hx of cancer, stroke, seizures, lung problem besides seasonal asthma, major cardiac events (except irregular heart beat at times - followed by cardiologist), diabetes, unexplained weight loss, changes in bowel or bladder problems, new onset stumbling or dropping things, neck problems, spinal surgery.    Limitations Lifting;House hold activities;Other (comment)   ctivities using R UE such as reaching, dressing, bathing, sleeping, getting out of the tub/shower while pulling on bar  with R UE, vacuuming, using a blower, sweeping, grooming, reaching in her car, carrying groceries, closing car hatch, walking dog   Diagnostic tests Right shoulder xray report 11/23/2020: "FINDINGS:     Type II acromion.  Mild-moderate AC joint degenerative change with joint   space narrowing, osteophyte formation.  The distal clavicle, scapula, and   proximal humerus are intact.  Glenohumeral and acromioclavicular joint   alignment appears normal.  No fractures or dislocations.  Well maintained   glenohumeral joint space without evidence of degenerative joint disease.     No soft tissue swelling or joint effusion.  No destructive bony lesion is   identified."    Patient Stated Goals would like to know how to help herself either heal or cope with her problem soas not to make it worse or irritated.    Currently in Pain? Yes    Pain Score 1     Pain Location Shoulder    Pain Orientation Right    Pain Onset More than a month ago               TREATMENT:    Manual therapy: to reduce pain and tissue tension, improve range of motion, neuromodulation,  in order to promote improved ability to complete functional activities. SUPINE - STM to R UT/LS and posterior rotator cuff (tender with concordant pain).   Therapeutic exercise: to centralize symptoms and improve ROM, strength, muscular endurance, and activity tolerance required for successful completion of functional activities.  - supine chest press with plus with 4# DB each hand, 3x10 - supine shoulder circumduction in press with plus position with 4#DB, 2x20 each direction, each side.  - seated B shoulder ER against yellow theraband, 3x10 - sidelying R shoulder AROM abduction to 90 degrees, slow tempo, 2x10 with 1# DB (Pain during movement but not after).  - standing scaption, 2x10 AROM below shoulder height to tolerance (1# - education with model on anatomy of shoulder and condition - standing scapular retraction with B shoulder extension  against yellow theraband, 1x10 with 5 second hold and cuing for technique and how to anchor band.  - standing Y lift offs, B UE, 2x10  - Education on HEP including handout and bands  Pt required multimodal cuing for proper technique and to facilitate improved neuromuscular control, strength, range of motion, and functional ability resulting in improved performance and form.    HOME EXERCISE PROGRAM Access Code: [email or web address] URL: [email or web address] Date: 01/24/2021 Prepared by: Rosita Kea   Exercises Supine Serratus Punches Resistance - 1 x daily - 3 sets - 10 reps Scapular Protraction with Table Lean - 1 x daily - 3 sets - 10 reps Sidelying Shoulder Abduction Palm Forward - 1 x daily - 2-3 sets - 10 reps Shoulder External Rotation with Resistance - 1 x daily - 3 sets - 10 reps Shoulder Extension with Resistance - Palms Forward - 1 x daily - 2-3 sets - 10 reps - 5 seconds hold   [email or web address] [id number]    WRIST EXTENSION STRETCH - TABLE -  Repeat 20 Times, Hold 5 Seconds, Complete 1 Set, Perform 2 Times a Day     PT Education - 01/29/21 1132     Education Details exercise/intevention purpose/technique.    Person(s) Educated Patient    Methods Explanation;Demonstration;Tactile cues;Verbal cues;Handout    Comprehension Verbalized understanding;Returned demonstration;Verbal cues required;Tactile cues required;Need further instruction              PT Short Term Goals - 01/04/21 1545       PT SHORT TERM GOAL #1   Title Be independent with initial home exercise program for self-management of symptoms.    Baseline Initial HEP to be provided visit 2 (01/04/2021);    Time 2    Period Weeks    Status New    Target Date 01/18/21               PT Long Term Goals - 01/04/21 1545       PT LONG TERM GOAL #1   Title Be independent with a long-term home exercise program for self-management of symptoms.    Baseline Intial HEP to be provided at visit 2 (01/04/2021);     Time 12    Period Weeks    Status New   TARGET DATE FOR ALL LONG TERM GOALS: 03/29/2021     PT LONG TERM GOAL #2   Title Demonstrate improved FOTO to equal or greater than 61 by visit #13 to demonstrate improvement in overall condition and self-reported functional ability.    Baseline 41 (01/04/2021);    Time 12    Period Weeks    Status New      PT LONG TERM GOAL #3  Title Have full R shoulder AROM with no compensations or increase in pain in all planes except intermittent end range discomfort to improve ability  to complete valued activities such reaching, carrying, dressing, etc.    Baseline limited and painful - see objective exam (01/04/2021);    Time 12    Period Weeks    Status New      PT LONG TERM GOAL #4   Title Reduce pain with functional activities to equal or less than 1/10 to allow patient to complete usual activities including reaching, dressing, walking the dog, etc with less difficulty.    Baseline up to 6/10 (01/04/2021);    Time 12    Period Weeks    Status New      PT LONG TERM GOAL #5   Title Improve B shoulder strength to equal or greater than 4+/5 with no increase in pain to improve patient's ability to complete functional tasks such as pulling, vacuuming, and walking the dog with less difficulty.    Baseline as low as 4/5 with pain - see objective exam (01/04/2021);    Time 12    Period Weeks    Status New      Additional Long Term Goals   Additional Long Term Goals Yes      PT LONG TERM GOAL #6   Title Complete community, work and/or recreational activities without limitation due to current condition.    Baseline Difficulty with activities using R UE such as reaching, dressing, bathing, sleeping, getting out of the tub/shower while pulling on bar with R UE, vacuuming, using a blower, sweeping, grooming, reaching in her car, carrying groceries, closing car hatch, walking dog. Avoids use of right arm (01/04/2021);    Time 12    Period Weeks    Status New                    Plan - 01/29/21 1212     Clinical Impression Statement Patient tolerated treatment well with no increase in pain by end of session. Noted for sensitivity to palpation with apparent trigger points in infraspinatus region. Patient continues to have pain during overhead motions that resolves with rest. Also noted that L shoulder felt more discomfort or weakness at times, so exercises included B shoulders. Patient would benefit from continued management of limiting condition by skilled physical therapist to address remaining impairments and functional limitations to work towards stated goals and return to PLOF or maximal functional independence.    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    Examination-Activity Limitations Hygiene/Grooming;Lift;Reach Overhead;Carry;Dressing    Examination-Participation Restrictions Cleaning;Community Activity;Occupation;Yard Work   activities using R UE such as reaching, dressing, bathing, sleeping, getting out of the tub/shower while pulling on bar with R UE, vacuuming, using a blower, sweeping, grooming, reaching in her car, carrying groceries, closing car hatch, walking dog   Stability/Clinical Decision Making Stable/Uncomplicated    Rehab Potential Good    PT Frequency 2x / week    PT Duration 12 weeks    PT Treatment/Interventions ADLs/Self Care Home Management;Cryotherapy;Electrical Stimulation;Moist Heat;Therapeutic activities;Therapeutic exercise;Neuromuscular  re-education;Manual techniques;Dry needling;Passive range of motion    PT Next Visit  Plan update HEP as appropriate,  pregressive postural and shoulder girdle strenthening as tolerated, manual therapy as needed    PT [place] Access Code: [email or web address]     [email or web address] [email or web address]    Consulted and Agree with Plan of Care Patient             Patient will benefit from skilled therapeutic intervention in order to improve the following deficits and impairments:  Pain, Increased muscle spasms, Decreased activity tolerance, Postural dysfunction, Decreased endurance, Decreased range of motion, Decreased strength, Hypomobility, Impaired perceived functional ability, Impaired UE functional use  Visit Diagnosis: Chronic right shoulder pain     Problem List Patient Active Problem List   Diagnosis Date Noted   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   Zenker's diverticulum - asymptomatic 03/27/2019   Functional dyspepsia 03/27/2019   Irritable bowel syndrome with diarrhea 03/27/2019   Alopecia 05/14/2018   Lung nodule 03/30/2018   Osteopenia of multiple sites 10/10/2016   Nausea 10/08/2016   Need for vaccination 12/28/2015   GAD (generalized anxiety disorder) 09/29/2015   Hematuria, microscopic 09/29/2015   Onychomycosis of toenail 09/29/2015   Anxiety 09/21/2014   Mixed hyperlipidemia 02/22/2014   Abnormal blood chemistry 12/25/2012   Abnormal chest CT 12/25/2012   Flushing reaction 11/19/2012   Gastroesophageal reflux disease 11/19/2012   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   Multiple gastric polyps 11/02/2012   Clarise Cruz R. Graylon Good, PT, DPT 01/29/21, 12:13 PM  [facility] [address] [place], [address] Phone: [phone]   Fax:  [phone]  Name: Elishia Kaczorowski MRN: [id number] Date of Birth: April 05, 1941

## 2021-01-31 ENCOUNTER — Ambulatory Visit: Payer: Medicare PPO | Admitting: Physical Therapy

## 2021-01-31 ENCOUNTER — Encounter: Payer: Self-pay | Admitting: Physical Therapy

## 2021-01-31 DIAGNOSIS — M25511 Pain in right shoulder: Secondary | ICD-10-CM | POA: Diagnosis not present

## 2021-01-31 NOTE — Therapy (Signed)
[facility] [address], [place], [address] Phone: [phone]   Fax:  [phone]  Physical Therapy Treatment  Patient Details  Name: Alison Bradshaw MRN: [id number] Date of Birth: December 22, 1940 Referring Provider (PT): Rosalia Hammers, DO   Encounter Date: 01/31/2021   PT End of Session - 01/31/21 1129     Visit Number 5    Number of Visits 24    Date for PT Re-Evaluation 03/29/21    Authorization Type [employer] reporting period from 01/04/2021    Authorization Time Period 24 visits 10/3 - 12/30  Authorization #[id number]    Authorization - Visit Number 4    Authorization - Number of Visits 24    Progress Note Due on Visit 10    PT Start Time 1117    PT Stop Time 1157    PT Time Calculation (min) 40 min    Activity Tolerance Patient tolerated treatment well    Behavior During Therapy [facility] for tasks assessed/performed             Past Medical History:  Diagnosis Date   Anxiety    Asthma    Atypical chest pain    Barrett's esophagus    Chronic gastritis    Diverticulosis    Duodenal ulcer    Gastritis    GERD (gastroesophageal reflux disease)    Hiatal hernia    History of echocardiogram    a. 08/2020 Echo: EF 60-65%, no rwma, Nl RV fxn, mild MR.   Hyperlipidemia    Non-obstructive CAD (coronary artery disease)    a. 10/2017 Stress Echo: Nl EF, no ischemia. Mild MR; b. 12/2017 Cardiac CTA: LM nl, LAD 50p, 30d, D1/2 nl, RI nl, LCX nl, OM1/2 nl, RCA nl, RPDA/RPL nl. Ca2+ score = 39 (46th percentile)-->statin dose escalated.   Osteopenia    PSVT (paroxysmal supraventricular tachycardia) ([place])    a. 10/2017 Holter: PSVT, max 5 beats->did not tolerate beta blocker; b. 09/2020 Zio: 38 runs of SVT. Longest 18.1 secs (106), fastest 203 bpm (6 beats). Occas PVCs (1.1%) - some assoc w/ triggered events.   Pulmonary nodules    a. 12/2017 RLL 32mm nodule noted on Cardiac CTA; b. 04/2018 RLL 11mm nodule  resolved; c. 09/2018 CT Chest: Previously noted posterior RUL nodules almost completely resolved. Bandlike scarring of RML and lingula-->consistent w/ improved atypical infxt.   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    Past Surgical History:  Procedure Laterality Date   BREAST BIOPSY Left 2008   neg   BREAST EXCISIONAL BIOPSY Left 2004   neg   CATARACT EXTRACTION W/ INTRAOCULAR LENS  IMPLANT, BILATERAL Bilateral    COLONOSCOPY WITH PROPOFOL N/A 01/22/2017   Procedure: COLONOSCOPY WITH PROPOFOL;  Surgeon: Toledo, Benay Pike, MD;  Location: [facility] ENDOSCOPY;  Service: Gastroenterology;  Laterality: N/A;   ESOPHAGOGASTRODUODENOSCOPY (EGD) WITH PROPOFOL N/A 01/20/2015   Procedure: ESOPHAGOGASTRODUODENOSCOPY (EGD) WITH PROPOFOL;  Surgeon: Lollie Sails, MD;  Location: [facility] ENDOSCOPY;  Service: Endoscopy;  Laterality: N/A;   ESOPHAGOGASTRODUODENOSCOPY (EGD) WITH PROPOFOL N/A 01/22/2017   Procedure: ESOPHAGOGASTRODUODENOSCOPY (EGD) WITH PROPOFOL;  Surgeon: Toledo, Benay Pike, MD;  Location: [facility] ENDOSCOPY;  Service: Gastroenterology;  Laterality: N/A;   ESOPHAGOGASTRODUODENOSCOPY (EGD) WITH PROPOFOL N/A 04/14/2018   Procedure: ESOPHAGOGASTRODUODENOSCOPY (EGD) WITH PROPOFOL;  Surgeon: Lollie Sails, MD;  Location: [facility] ENDOSCOPY;  Service: Endoscopy;  Laterality: N/A;   EYE  SURGERY Right    laser scraping   OPEN REDUCTION INTERNAL FIXATION (ORIF) DISTAL RADIAL FRACTURE Right 02/09/2018   Procedure: OPEN REDUCTION INTERNAL FIXATION (ORIF) DISTAL RADIAL FRACTURE;  Surgeon: Hessie Knows, MD;  Location: [facility] ORS;  Service: Orthopedics;  Laterality: Right;   TONSILLECTOMY      There were no vitals filed for this visit.   Subjective Assessment - 01/31/21 1120     Subjective Pateint reports she is feeling well today. She did her HEP yesterday and did okay with it. She currently does not have pain. Reports  something "gave" in her right wrist and now it seems to move better. It is still a little sore to put weight on.    Pertinent History Patient is a 80 y.o. female who presents to outpatient physical therapy with a referral for medical diagnosis acute pain of right shoulder, impingement syndrome of right shoulder, biceps tendinitis of right upper extremity, arthritis of right acromioclavicular joint. This patient's chief complaints consist of right lateral shoulder pain with activity with discomfort in upper trap region leading to the following functional deficits: Difficulty with activities using R UE such as reaching, dressing, bathing, sleeping, getting out of the tub/shower while pulling on bar with R UE, vacuuming, using a blower, sweeping, grooming, reaching in her car, carrying groceries, closing car hatch, walking dog. Avoids use of right arm. .  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  Patient denies hx of cancer, stroke, seizures, lung problem besides seasonal asthma, major cardiac events (except irregular heart beat at times - followed by cardiologist), diabetes, unexplained weight loss, changes in bowel or bladder problems, new onset stumbling or dropping things, neck problems, spinal surgery.    Limitations Lifting;House hold activities;Other (comment)   ctivities using R UE such as reaching, dressing, bathing, sleeping, getting out of the tub/shower while pulling on bar with R UE, vacuuming, using a blower, sweeping, grooming, reaching in her car, carrying groceries, closing car hatch, walking dog   Diagnostic tests Right shoulder xray report 11/23/2020: "FINDINGS:     Type II  acromion.  Mild-moderate AC joint degenerative change with joint   space narrowing, osteophyte formation.  The distal clavicle, scapula, and   proximal humerus are intact.  Glenohumeral and acromioclavicular joint   alignment appears normal.  No fractures or dislocations.  Well maintained   glenohumeral joint space without evidence of degenerative joint disease.     No soft tissue swelling or joint effusion.  No destructive bony lesion is   identified."    Patient Stated Goals would like to know how to help herself either heal or cope with her problem soas not to make it worse or irritated.    Currently in Pain? No/denies    Pain Onset More than a month ago             OBJECTIVE  SELF-REPORTED FUNCTION FOTO score: 59/100 (shoulder questionnaire)   TREATMENT:    Manual therapy: to reduce pain and tissue tension, improve range of motion, neuromodulation, in order to promote improved ability to complete functional activities. SUPINE - STM to R posterior rotator cuff (tender with concordant pain).  - R GH joint mobs grade II-III, 2x30 seconds distraction, 1x30 seconds caudal glide at 80 degress, 1x30 seconds  AP glide.    Therapeutic exercise: to centralize symptoms and improve ROM, strength, muscular endurance, and activity tolerance required for successful completion of functional activities.  - AAROM wall slides with R shoulder, flexion and abduction, 1x10 each direction. Mid range pain noted.  - supine chest press with plus with 5# DB each hand, 3x10 (midrange pain/popping noted in R shoulder).  - supine shoulder circumduction in press with plus position with 5#DB, 2x20 each direction, each side.  - sidelying R shoulder AROM abduction to 90 degrees, slow tempo, 3x10 with 1# DB (Pain during movement but not after).  - seated B shoulder ER against yellow theraband, 3x10   Pt required multimodal cuing for proper technique and to facilitate improved neuromuscular control, strength, range of  motion, and functional ability resulting in improved performance and form.    HOME EXERCISE PROGRAM Access Code: [email or web address] URL: [email or web address] Date: 01/24/2021 Prepared by: Rosita Kea   Exercises Supine Serratus Punches Resistance - 1 x daily - 3 sets - 10 reps Scapular Protraction with Table Lean - 1 x daily - 3 sets - 10 reps Sidelying Shoulder Abduction Palm Forward - 1 x daily - 2-3 sets - 10 reps Shoulder External Rotation with Resistance - 1 x daily - 3 sets - 10 reps Shoulder Extension with Resistance - Palms Forward - 1 x daily - 2-3 sets - 10 reps - 5 seconds hold   [email or web address] [id number]    WRIST EXTENSION STRETCH - TABLE -  Repeat 20 Times, Hold 5 Seconds, Complete 1 Set, Perform 2 Times a Day      PT Education - 01/31/21 1128     Education Details exercise/intevention purpose/technique.    Person(s) Educated Patient    Methods Explanation;Demonstration;Tactile cues;Verbal cues    Comprehension Verbalized understanding;Returned demonstration;Verbal cues required;Tactile cues required;Need further instruction              PT Short Term Goals - 01/04/21 1545       PT SHORT TERM GOAL #1   Title Be independent with initial home exercise program for self-management of symptoms.    Baseline Initial HEP to be provided visit 2 (01/04/2021);    Time 2    Period Weeks    Status New    Target Date 01/18/21               PT Long Term Goals - 01/04/21 1545       PT LONG TERM GOAL #1   Title Be independent with a long-term home exercise program for self-management of symptoms.    Baseline Intial HEP to be provided at visit 2 (01/04/2021);    Time 12    Period Weeks    Status New   TARGET DATE FOR ALL LONG TERM GOALS: 03/29/2021     PT LONG TERM GOAL #2   Title Demonstrate improved FOTO to equal or greater than 61 by visit #13 to demonstrate improvement in overall condition and self-reported functional ability.    Baseline 41 (01/04/2021);     Time 12    Period Weeks    Status New      PT LONG TERM GOAL #3   Title Have full R shoulder AROM with no compensations or increase in pain in all planes except intermittent end range discomfort to improve ability  to complete valued activities such reaching, carrying, dressing, etc.    Baseline limited and painful - see objective exam (01/04/2021);    Time 12    Period Weeks  Status New      PT LONG TERM GOAL #4   Title Reduce pain with functional activities to equal or less than 1/10 to allow patient to complete usual activities including reaching, dressing, walking the dog, etc with less difficulty.    Baseline up to 6/10 (01/04/2021);    Time 12    Period Weeks    Status New      PT LONG TERM GOAL #5   Title Improve B shoulder strength to equal or greater than 4+/5 with no increase in pain to improve patient's ability to complete functional tasks such as pulling, vacuuming, and walking the dog with less difficulty.    Baseline as low as 4/5 with pain - see objective exam (01/04/2021);    Time 12    Period Weeks    Status New      Additional Long Term Goals   Additional Long Term Goals Yes      PT LONG TERM GOAL #6   Title Complete community, work and/or recreational activities without limitation due to current condition.    Baseline Difficulty with activities using R UE such as reaching, dressing, bathing, sleeping, getting out of the tub/shower while pulling on bar with R UE, vacuuming, using a blower, sweeping, grooming, reaching in her car, carrying groceries, closing car hatch, walking dog. Avoids use of right arm (01/04/2021);    Time 12    Period Weeks    Status New                   Plan - 01/31/21 1219     Clinical Impression Statement Patient tolerated treatment well overall and was able to increase weight on some exercises. FOTO score shows significant improvement in self reported function. Patient continues to have consistent mid range pain with overhead  motion and weakness that is limiting her function. Patient would benefit from continued management of limiting condition by skilled physical therapist to address remaining impairments and functional limitations to work towards stated goals and return to PLOF or maximal functional independence.    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    Examination-Activity Limitations Hygiene/Grooming;Lift;Reach Overhead;Carry;Dressing    Examination-Participation Restrictions Cleaning;Community Activity;Occupation;Yard Work   activities using R UE such as reaching, dressing, bathing, sleeping, getting out of the tub/shower while pulling on bar with R UE, vacuuming, using a blower, sweeping, grooming, reaching in her car, carrying groceries, closing car hatch, walking dog   Stability/Clinical Decision Making Stable/Uncomplicated    Rehab Potential Good    PT Frequency 2x / week    PT Duration 12 weeks    PT Treatment/Interventions ADLs/Self Care Home Management;Cryotherapy;Electrical Stimulation;Moist Heat;Therapeutic activities;Therapeutic exercise;Neuromuscular re-education;Manual techniques;Dry needling;Passive range of motion    PT Next Visit Plan update HEP as appropriate,  pregressive postural and shoulder girdle strenthening as tolerated, manual therapy as needed    PT [place] Access Code: [email or web address]     [email or web address] 2TRSQDQ    Consulted and Agree with Plan of Care Patient             Patient will benefit from skilled  therapeutic intervention in order to improve the following deficits  and impairments:  Pain, Increased muscle spasms, Decreased activity tolerance, Postural dysfunction, Decreased endurance, Decreased range of motion, Decreased strength, Hypomobility, Impaired perceived functional ability, Impaired UE functional use  Visit Diagnosis: Chronic right shoulder pain     Problem List Patient Active Problem List   Diagnosis Date Noted   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   Zenker's diverticulum - asymptomatic 03/27/2019   Functional dyspepsia 03/27/2019   Irritable bowel syndrome with diarrhea 03/27/2019   Alopecia 05/14/2018   Lung nodule 03/30/2018   Osteopenia of multiple sites 10/10/2016   Nausea 10/08/2016   Need for vaccination 12/28/2015   GAD (generalized anxiety disorder) 09/29/2015   Hematuria, microscopic 09/29/2015   Onychomycosis of toenail 09/29/2015   Anxiety 09/21/2014   Mixed hyperlipidemia 02/22/2014   Abnormal blood chemistry 12/25/2012   Abnormal chest CT 12/25/2012   Flushing reaction 11/19/2012   Gastroesophageal reflux disease 11/19/2012   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   Multiple gastric polyps 11/02/2012   Clarise Cruz R. Graylon Good, PT, DPT 01/31/21, 12:20 PM   [facility] [address] [place], [address] Phone: [phone]   Fax:  [phone]  Name: Alison Bradshaw MRN: [id number] Date of Birth: January 23, 1941

## 2021-02-05 ENCOUNTER — Encounter: Payer: Medicare PPO | Admitting: Physical Therapy

## 2021-02-07 ENCOUNTER — Encounter: Payer: Self-pay | Admitting: Physical Therapy

## 2021-02-07 ENCOUNTER — Ambulatory Visit: Payer: Medicare PPO | Attending: Sports Medicine | Admitting: Physical Therapy

## 2021-02-07 DIAGNOSIS — G8929 Other chronic pain: Secondary | ICD-10-CM | POA: Insufficient documentation

## 2021-02-07 DIAGNOSIS — M25511 Pain in right shoulder: Secondary | ICD-10-CM | POA: Diagnosis present

## 2021-02-07 NOTE — Therapy (Signed)
[facility] [address], [place], [address] Phone: [phone]   Fax:  [phone]  Physical Therapy Treatment  Patient Details  Name: Alison Bradshaw MRN: [id number] Date of Birth: 1940/12/01 Referring Provider (PT): Rosalia Hammers, DO   Encounter Date: 02/07/2021   PT End of Session - 02/07/21 1031     Visit Number 6    Number of Visits 24    Date for PT Re-Evaluation 03/29/21    Authorization Type [employer]IC[employer] reporting period from 01/04/2021    Authorization Time Period 24 visits 10/3 - 12/30  Authorization #[id number]    Authorization - Visit Number 5    Authorization - Number of Visits 24    Progress Note Due on Visit 10    PT Start Time 0950    PT Stop Time 1030    PT Time Calculation (min) 40 min    Activity Tolerance Patient tolerated treatment well    Behavior During Therapy [facility] for tasks assessed/performed             Past Medical History:  Diagnosis Date   Anxiety    Asthma    Atypical chest pain    Barrett's esophagus    Chronic gastritis    Diverticulosis    Duodenal ulcer    Gastritis    GERD (gastroesophageal reflux disease)    Hiatal hernia    History of echocardiogram    a. 08/2020 Echo: EF 60-65%, no rwma, Nl RV fxn, mild MR.   Hyperlipidemia    Non-obstructive CAD (coronary artery disease)    a. 10/2017 Stress Echo: Nl EF, no ischemia. Mild MR; b. 12/2017 Cardiac CTA: LM nl, LAD 50p, 30d, D1/2 nl, RI nl, LCX nl, OM1/2 nl, RCA nl, RPDA/RPL nl. Ca2+ score = 39 (46th percentile)-->statin dose escalated.   Osteopenia    PSVT (paroxysmal supraventricular tachycardia) ([place])    a. 10/2017 Holter: PSVT, max 5 beats->did not tolerate beta blocker; b. 09/2020 Zio: 38 runs of SVT. Longest 18.1 secs (106), fastest 203 bpm (6 beats). Occas PVCs (1.1%) - some assoc w/ triggered events.   Pulmonary nodules    a. 12/2017 RLL 36mm nodule noted on Cardiac CTA; b. 04/2018 RLL 65mm nodule  resolved; c. 09/2018 CT Chest: Previously noted posterior RUL nodules almost completely resolved. Bandlike scarring of RML and lingula-->consistent w/ improved atypical infxt.   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    Past Surgical History:  Procedure Laterality Date   BREAST BIOPSY Left 2008   neg   BREAST EXCISIONAL BIOPSY Left 2004   neg   CATARACT EXTRACTION W/ INTRAOCULAR LENS  IMPLANT, BILATERAL Bilateral    COLONOSCOPY WITH PROPOFOL N/A 01/22/2017   Procedure: COLONOSCOPY WITH PROPOFOL;  Surgeon: Toledo, Benay Pike, MD;  Location: [facility] ENDOSCOPY;  Service: Gastroenterology;  Laterality: N/A;   ESOPHAGOGASTRODUODENOSCOPY (EGD) WITH PROPOFOL N/A 01/20/2015   Procedure: ESOPHAGOGASTRODUODENOSCOPY (EGD) WITH PROPOFOL;  Surgeon: Lollie Sails, MD;  Location: [facility] ENDOSCOPY;  Service: Endoscopy;  Laterality: N/A;   ESOPHAGOGASTRODUODENOSCOPY (EGD) WITH PROPOFOL N/A 01/22/2017   Procedure: ESOPHAGOGASTRODUODENOSCOPY (EGD) WITH PROPOFOL;  Surgeon: Toledo, Benay Pike, MD;  Location: [facility] ENDOSCOPY;  Service: Gastroenterology;  Laterality: N/A;   ESOPHAGOGASTRODUODENOSCOPY (EGD) WITH PROPOFOL N/A 04/14/2018   Procedure: ESOPHAGOGASTRODUODENOSCOPY (EGD) WITH PROPOFOL;  Surgeon: Lollie Sails, MD;  Location: [facility] ENDOSCOPY;  Service: Endoscopy;  Laterality: N/A;   EYE  SURGERY Right    laser scraping   OPEN REDUCTION INTERNAL FIXATION (ORIF) DISTAL RADIAL FRACTURE Right 02/09/2018   Procedure: OPEN REDUCTION INTERNAL FIXATION (ORIF) DISTAL RADIAL FRACTURE;  Surgeon: Hessie Knows, MD;  Location: [facility] ORS;  Service: Orthopedics;  Laterality: Right;   TONSILLECTOMY      There were no vitals filed for this visit.   Subjective Assessment - 02/07/21 0953     Subjective Patient reports she is feeling well today but she think she overdid it at her last PT session. She had a lot of pain over her right upper  trap region and she felt it was due to increased resistance maybe with ER or rows, so she reduced resistance for these exercises and it is feeling better now. However, she grabbed the door that was shutting with her wrist pronated and moved in ER/horizontal abduction as she was coming into the clinic just now and it really pulled hard in the painful spot in her upper back/upper trap region. She has been taking a bit more tylenol and using voltaren gel. Current pain number at rest is 5/10 and with certain movement 2/58.    Pertinent History Patient is a 80 y.o. female who presents to outpatient physical therapy with a referral for medical diagnosis acute pain of right shoulder, impingement syndrome of right shoulder, biceps tendinitis of right upper extremity, arthritis of right acromioclavicular joint. This patient's chief complaints consist of right lateral shoulder pain with activity with discomfort in upper trap region leading to the following functional deficits: Difficulty with activities using R UE such as reaching, dressing, bathing, sleeping, getting out of the tub/shower while pulling on bar with R UE, vacuuming, using a blower, sweeping, grooming, reaching in her car, carrying groceries, closing car hatch, walking dog. Avoids use of right arm. .  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  Patient denies hx of cancer, stroke, seizures, lung problem besides seasonal asthma, major cardiac events (except irregular heart beat at times - followed by cardiologist), diabetes, unexplained weight loss, changes in bowel or bladder problems, new onset stumbling or dropping things, neck problems,  spinal surgery.    Limitations Lifting;House hold activities;Other (comment)   ctivities using R UE such as reaching, dressing, bathing, sleeping, getting out of the tub/shower while pulling on bar with R UE, vacuuming, using a blower, sweeping, grooming, reaching in her car, carrying groceries, closing car hatch, walking dog   Diagnostic tests Right shoulder xray report 11/23/2020: "FINDINGS:     Type II acromion.  Mild-moderate AC joint degenerative change with joint   space narrowing, osteophyte formation.  The distal clavicle, scapula, and   proximal humerus are intact.  Glenohumeral and acromioclavicular joint   alignment appears normal.  No fractures or dislocations.  Well maintained   glenohumeral joint space without evidence of degenerative joint disease.     No soft tissue swelling or joint effusion.  No destructive bony lesion is   identified."    Patient Stated Goals would like to know how to help herself either heal or cope with her problem soas not to make it worse or irritated.    Currently in Pain? Yes    Pain Score 1     Pain Onset More than a month ago  TREATMENT:    Manual therapy: to reduce pain and tissue tension, improve range of motion, neuromodulation, in order to promote improved ability to complete functional activities. SEATED - STM to right upper trap, levator scap, rhomboids, and posterior rotator cuff. Pressure to R infraspinatus reproduces pain. Patient reports improved comfort following.    Therapeutic exercise: to centralize symptoms and improve ROM, strength, muscular endurance, and activity tolerance required for successful completion of functional activities.  - AAROM wall slides for R shoulder, flexion (with B UE on pillowcase)1x10 each direction. Mid range pain noted - quadruped serratus press, 1x10 (pain with retraction so limited motion).  - quadruped LE only bird dog,  hands flat 1x5 each side. With fists 1x2 each side.  - standing  isometric R shoulder exercises at wall, 1x10 with 4 second holds each of the following motions: ER, abduction, flexion, extension. Cuing for improved posture and instruction on how to perform. Patient reports they fell "really good" - Education on HEP including handout    Pt required multimodal cuing for proper technique and to facilitate improved neuromuscular control, strength, range of motion, and functional ability resulting in improved performance and form.    HOME EXERCISE PROGRAM Access Code: [email or web address] URL: [email or web address] Date: 02/07/2021 Prepared by: Rosita Kea  Exercises Standing Isometric Shoulder External Rotation with Doorway and Towel Roll - 1 x daily - 2 sets - 10 reps - 4 seconds hold Standing Isometric Shoulder Abduction with Doorway - Arm Bent - 1 x daily - 2 sets - 10 reps - 4 seconds hold Standing Isometric Shoulder Flexion with Doorway - Arm Bent - 1 x daily - 2 sets - 10 reps - 5 seconds hold Standing Isometric Shoulder Extension with Doorway - Arm Bent - 1 x daily - 2 sets - 10 reps - 4 seconds hold Quadruped Alternating Leg Extensions - 1 x daily - 3 sets - 5-10 reps    [email or web address] [id number]    WRIST EXTENSION STRETCH - TABLE -  Repeat 20 Times, Hold 5 Seconds, Complete 1 Set, Perform 2 Times a Day     PT Education - 02/07/21 0956     Education Details exercise/intevention purpose/technique.    Person(s) Educated Patient    Methods Explanation;Demonstration;Tactile cues;Verbal cues    Comprehension Verbalized understanding;Returned demonstration;Verbal cues required;Tactile cues required;Need further instruction              PT Short Term Goals - 01/04/21 1545       PT SHORT TERM GOAL #1   Title Be independent with initial home exercise program for self-management of symptoms.    Baseline Initial HEP to be provided visit 2 (01/04/2021);    Time 2    Period Weeks    Status New    Target Date 01/18/21               PT Long  Term Goals - 01/04/21 1545       PT LONG TERM GOAL #1   Title Be independent with a long-term home exercise program for self-management of symptoms.    Baseline Intial HEP to be provided at visit 2 (01/04/2021);    Time 12    Period Weeks    Status New   TARGET DATE FOR ALL LONG TERM GOALS: 03/29/2021     PT LONG TERM GOAL #2   Title Demonstrate improved FOTO to equal or greater than 61 by visit #13 to demonstrate improvement in overall condition and self-reported functional ability.    Baseline  41 (01/04/2021);    Time 12    Period Weeks    Status New      PT LONG TERM GOAL #3   Title Have full R shoulder AROM with no compensations or increase in pain in all planes except intermittent end range discomfort to improve ability  to complete valued activities such reaching, carrying, dressing, etc.    Baseline limited and painful - see objective exam (01/04/2021);    Time 12    Period Weeks    Status New      PT LONG TERM GOAL #4   Title Reduce pain with functional activities to equal or less than 1/10 to allow patient to complete usual activities including reaching, dressing, walking the dog, etc with less difficulty.    Baseline up to 6/10 (01/04/2021);    Time 12    Period Weeks    Status New      PT LONG TERM GOAL #5   Title Improve B shoulder strength to equal or greater than 4+/5 with no increase in pain to improve patient's ability to complete functional tasks such as pulling, vacuuming, and walking the dog with less difficulty.    Baseline as low as 4/5 with pain - see objective exam (01/04/2021);    Time 12    Period Weeks    Status New      Additional Long Term Goals   Additional Long Term Goals Yes      PT LONG TERM GOAL #6   Title Complete community, work and/or recreational activities without limitation due to current condition.    Baseline Difficulty with activities using R UE such as reaching, dressing, bathing, sleeping, getting out of the tub/shower while pulling on  bar with R UE, vacuuming, using a blower, sweeping, grooming, reaching in her car, carrying groceries, closing car hatch, walking dog. Avoids use of right arm (01/04/2021);    Time 12    Period Weeks    Status New                   Plan - 02/07/21 1037     Clinical Impression Statement Patient tolerated treatment well overall and reported significant pain relief form manual and isometric exercises. Regressed HEP to isometrics at the wall due to good response in clinic and history of difficulty tolerating dynamic exercises. Patient demonstrates improved weight bearing tolerance on R wrist and was able to progress to quadruped position exercise today. Patient would benefit from continued management of limiting condition by skilled physical therapist to address remaining impairments and functional limitations to work towards stated goals and return to PLOF or maximal functional independence.    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    Examination-Activity Limitations Hygiene/Grooming;Lift;Reach Overhead;Carry;Dressing    Examination-Participation Restrictions Cleaning;Community Activity;Occupation;Yard Work   activities using R UE such as reaching, dressing, bathing, sleeping, getting out of the tub/shower while pulling on bar with R UE, vacuuming, using a blower, sweeping, grooming, reaching in her car, carrying groceries, closing car hatch, walking dog   Stability/Clinical Decision Making Stable/Uncomplicated    Rehab Potential Good     PT Frequency 2x / week  PT Duration 12 weeks    PT Treatment/Interventions ADLs/Self Care Home Management;Cryotherapy;Electrical Stimulation;Moist Heat;Therapeutic activities;Therapeutic exercise;Neuromuscular re-education;Manual techniques;Dry needling;Passive range of motion    PT Next Visit Plan update HEP as appropriate,  pregressive postural and shoulder girdle strenthening as tolerated, manual therapy as needed    PT Helen Access Code: [email or web address]     [email or web address] [email or web address]    Consulted and Agree with Plan of Care Patient             Patient will benefit from skilled therapeutic intervention in order to improve the following deficits and impairments:  Pain, Increased muscle spasms, Decreased activity tolerance, Postural dysfunction, Decreased endurance, Decreased range of motion, Decreased strength, Hypomobility, Impaired perceived functional ability, Impaired UE functional use  Visit Diagnosis: Chronic right shoulder pain     Problem List Patient Active Problem List   Diagnosis Date Noted   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   Zenker's diverticulum - asymptomatic 03/27/2019   Functional dyspepsia 03/27/2019   Irritable bowel syndrome with diarrhea 03/27/2019   Alopecia 05/14/2018   Lung nodule 03/30/2018   Osteopenia of multiple sites 10/10/2016   Nausea 10/08/2016   Need for vaccination 12/28/2015   GAD (generalized anxiety disorder) 09/29/2015   Hematuria, microscopic 09/29/2015   Onychomycosis of toenail 09/29/2015   Anxiety 09/21/2014   Mixed hyperlipidemia 02/22/2014   Abnormal blood chemistry 12/25/2012   Abnormal chest CT 12/25/2012   Flushing reaction 11/19/2012   Gastroesophageal reflux disease 11/19/2012   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 Graylon Good, PT, DPT 02/07/21, 10:38 AM   [facility] [address] [place], [address] Phone: [phone]   Fax:  [phone]  Name: Cedar Roseman MRN: [id number] Date of Birth: 1940-11-18

## 2021-02-12 ENCOUNTER — Encounter: Payer: Medicare PPO | Admitting: Physical Therapy

## 2021-02-14 ENCOUNTER — Ambulatory Visit: Payer: Medicare PPO | Admitting: Physical Therapy

## 2021-02-14 ENCOUNTER — Encounter: Payer: Self-pay | Admitting: Physical Therapy

## 2021-02-14 DIAGNOSIS — G8929 Other chronic pain: Secondary | ICD-10-CM

## 2021-02-14 DIAGNOSIS — M25511 Pain in right shoulder: Secondary | ICD-10-CM | POA: Diagnosis not present

## 2021-02-14 NOTE — Therapy (Signed)
[facility] [address], [place], [address] Phone: [phone]   Fax:  [phone]  Physical Therapy Treatment  Patient Details  Name: Alison Bradshaw MRN: [id number] Date of Birth: [birth year] Referring Provider (PT): Rosalia Hammers, DO   Encounter Date: 02/14/2021   PT End of Session - 02/14/21 1259     Visit Number 7    Number of Visits 24    Date for PT Re-Evaluation 03/29/21    Authorization Type [employer]IC[employer] reporting period from 01/04/2021    Authorization Time Period 24 visits 10/3 - 12/30  Authorization #[id number]    Authorization - Visit Number 6    Authorization - Number of Visits 24    Progress Note Due on Visit 10    PT Start Time 1035    PT Stop Time 1115    PT Time Calculation (min) 40 min    Activity Tolerance Patient tolerated treatment well    Behavior During Therapy [facility] for tasks assessed/performed             Past Medical History:  Diagnosis Date   Anxiety    Asthma    Atypical chest pain    Barrett's esophagus    Chronic gastritis    Diverticulosis    Duodenal ulcer    Gastritis    GERD (gastroesophageal reflux disease)    Hiatal hernia    History of echocardiogram    a. 08/2020 Echo: EF 60-65%, no rwma, Nl RV fxn, mild MR.   Hyperlipidemia    Non-obstructive CAD (coronary artery disease)    a. 10/2017 Stress Echo: Nl EF, no ischemia. Mild MR; b. 12/2017 Cardiac CTA: LM nl, LAD 50p, 30d, D1/2 nl, RI nl, LCX nl, OM1/2 nl, RCA nl, RPDA/RPL nl. Ca2+ score = 39 (46th percentile)-->statin dose escalated.   Osteopenia    PSVT (paroxysmal supraventricular tachycardia) ([place])    a. 10/2017 Holter: PSVT, max 5 beats->did not tolerate beta blocker; b. 09/2020 Zio: 38 runs of SVT. Longest 18.1 secs (106), fastest 203 bpm (6 beats). Occas PVCs (1.1%) - some assoc w/ triggered events.   Pulmonary nodules    a. 12/2017 RLL 35mm nodule noted on Cardiac CTA; b. 04/2018 RLL 40mm nodule  resolved; c. 09/2018 CT Chest: Previously noted posterior RUL nodules almost completely resolved. Bandlike scarring of RML and lingula-->consistent w/ improved atypical infxt.   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    Past Surgical History:  Procedure Laterality Date   BREAST BIOPSY Left 2008   neg   BREAST EXCISIONAL BIOPSY Left 2004   neg   CATARACT EXTRACTION W/ INTRAOCULAR LENS  IMPLANT, BILATERAL Bilateral    COLONOSCOPY WITH PROPOFOL N/A 01/22/2017   Procedure: COLONOSCOPY WITH PROPOFOL;  Surgeon: Toledo, Benay Pike, MD;  Location: [facility] ENDOSCOPY;  Service: Gastroenterology;  Laterality: N/A;   ESOPHAGOGASTRODUODENOSCOPY (EGD) WITH PROPOFOL N/A 01/20/2015   Procedure: ESOPHAGOGASTRODUODENOSCOPY (EGD) WITH PROPOFOL;  Surgeon: Lollie Sails, MD;  Location: [facility] ENDOSCOPY;  Service: Endoscopy;  Laterality: N/A;   ESOPHAGOGASTRODUODENOSCOPY (EGD) WITH PROPOFOL N/A 01/22/2017   Procedure: ESOPHAGOGASTRODUODENOSCOPY (EGD) WITH PROPOFOL;  Surgeon: Toledo, Benay Pike, MD;  Location: [facility] ENDOSCOPY;  Service: Gastroenterology;  Laterality: N/A;   ESOPHAGOGASTRODUODENOSCOPY (EGD) WITH PROPOFOL N/A 04/14/2018   Procedure: ESOPHAGOGASTRODUODENOSCOPY (EGD) WITH PROPOFOL;  Surgeon: Lollie Sails, MD;  Location: [facility] ENDOSCOPY;  Service: Endoscopy;  Laterality: N/A;   EYE  SURGERY Right    laser scraping   OPEN REDUCTION INTERNAL FIXATION (ORIF) DISTAL RADIAL FRACTURE Right 02/09/2018   Procedure: OPEN REDUCTION INTERNAL FIXATION (ORIF) DISTAL RADIAL FRACTURE;  Surgeon: Hessie Knows, MD;  Location: [facility] ORS;  Service: Orthopedics;  Laterality: Right;   TONSILLECTOMY      There were no vitals filed for this visit.   Subjective Assessment - 02/14/21 1040     Subjective Patient reports her pain is 1/10 at the right upper thoracic spine. She does have lateral right shoulder pain with certain motions (across  the body and overhead). She thinks the isometric exercises have been more comfortable over the last week. She states she had a headache for one day after last PT session but then her shoulder felt much better after the manual therapy for two days.    Pertinent History Patient is a 80 y.o. female who presents to outpatient physical therapy with a referral for medical diagnosis acute pain of right shoulder, impingement syndrome of right shoulder, biceps tendinitis of right upper extremity, arthritis of right acromioclavicular joint. This patient's chief complaints consist of right lateral shoulder pain with activity with discomfort in upper trap region leading to the following functional deficits: Difficulty with activities using R UE such as reaching, dressing, bathing, sleeping, getting out of the tub/shower while pulling on bar with R UE, vacuuming, using a blower, sweeping, grooming, reaching in her car, carrying groceries, closing car hatch, walking dog. Avoids use of right arm. .  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  Patient denies hx of cancer, stroke, seizures, lung problem besides seasonal asthma, major cardiac events (except irregular heart beat at times - followed by cardiologist), diabetes, unexplained weight loss, changes in bowel or bladder problems, new onset stumbling or dropping things, neck problems, spinal surgery.    Limitations Lifting;House hold activities;Other (comment)   ctivities using R UE such as reaching, dressing, bathing, sleeping, getting out of the tub/shower while pulling on bar with R UE, vacuuming, using a blower, sweeping, grooming, reaching in her car,  carrying groceries, closing car hatch, walking dog   Diagnostic tests Right shoulder xray report 11/23/2020: "FINDINGS:     Type II acromion.  Mild-moderate AC joint degenerative change with joint   space narrowing, osteophyte formation.  The distal clavicle, scapula, and   proximal humerus are intact.  Glenohumeral and acromioclavicular joint   alignment appears normal.  No fractures or dislocations.  Well maintained   glenohumeral joint space without evidence of degenerative joint disease.     No soft tissue swelling or joint effusion.  No destructive bony lesion is   identified."    Patient Stated Goals would like to know how to help herself either heal or cope with her problem soas not to make it worse or irritated.    Currently in Pain? Yes    Pain Score 1     Pain Onset More than a month ago                TREATMENT:    Manual therapy: to reduce pain and tissue tension, improve range of motion, neuromodulation, in order to promote improved ability to complete functional activities. SEATED - STM to right upper trap, levator scap, rhomboids, and posterior rotator  cuff. Pressure to R infraspinatus reproduces pain. Patient reports improved comfort following.    Therapeutic exercise: to centralize symptoms and improve ROM, strength, muscular endurance, and activity tolerance required for successful completion of functional activities.  - AAROM wall slides for B shoulder, flexion (with B UE on pillowcase) 1x20 each direction. Mid range pain noted - standing scapular row, 3x10 with 10# cable. Cuing to prevent excessive shoulder extension and to engage scapulae. No pain.  - Quadruped bird dog (alternating shoulder flexion/contralateral hip extension with core muscles braced), 2x10 each side. Rolled up rag under R wrist to decrease pain from extension. Foam roll over sacrum.  - standing isometric R shoulder exercises at wall, 1x10 with 4 second holds each of the following motions: ER,  abduction, flexion. Cuing for improved posture. Patient reports they feel good.   - Education on HEP including handout    Pt required multimodal cuing for proper technique and to facilitate improved neuromuscular control, strength, range of motion, and functional ability resulting in improved performance and form.    HOME EXERCISE PROGRAM Access Code: [email or web address] URL: [email or web address] Date: 02/14/2021 Prepared by: Rosita Kea  Exercises Standing Isometric Shoulder External Rotation with Doorway and Towel Roll - 1 x daily - 2 sets - 10 reps - 4 seconds hold Standing Isometric Shoulder Abduction with Doorway - Arm Bent - 1 x daily - 2 sets - 10 reps - 4 seconds hold Standing Isometric Shoulder Flexion with Doorway - Arm Bent - 1 x daily - 2 sets - 10 reps - 5 seconds hold Row with band/cable - 1 x daily - 3 sets - 10 reps - 2 seconds hold Bird Dog - 1 x daily - 3 sets - 10 reps - 5 second hold    [email or web address] [id number]    WRIST EXTENSION STRETCH - TABLE -  Repeat 20 Times, Hold 5 Seconds, Complete 1 Set, Perform 2 Times a Day     PT Education - 02/14/21 1045     Education Details exercise/intevention purpose/technique.    Person(s) Educated Patient    Methods Explanation;Demonstration;Tactile cues;Verbal cues    Comprehension Verbalized understanding;Returned demonstration;Verbal cues required;Tactile cues required;Need further instruction              PT Short Term Goals - 02/14/21 1308       PT SHORT TERM GOAL #1   Title Be independent with initial home exercise program for self-management of symptoms.    Baseline Initial HEP to be provided visit 2 (01/04/2021);    Time 2    Period Weeks    Status Achieved    Target Date 01/18/21               PT Long Term Goals - 01/04/21 1545       PT LONG TERM GOAL #1   Title Be independent with a long-term home exercise program for self-management of symptoms.    Baseline Intial HEP to be provided at visit 2  (01/04/2021);    Time 12    Period Weeks    Status New   TARGET DATE FOR ALL LONG TERM GOALS: 03/29/2021     PT LONG TERM GOAL #2   Title Demonstrate improved FOTO to equal or greater than 61 by visit #13 to demonstrate improvement in overall condition and self-reported functional ability.    Baseline 41 (01/04/2021);    Time 12    Period Weeks    Status New      PT LONG TERM GOAL #  3   Title Have full R shoulder AROM with no compensations or increase in pain in all planes except intermittent end range discomfort to improve ability  to complete valued activities such reaching, carrying, dressing, etc.    Baseline limited and painful - see objective exam (01/04/2021);    Time 12    Period Weeks    Status New      PT LONG TERM GOAL #4   Title Reduce pain with functional activities to equal or less than 1/10 to allow patient to complete usual activities including reaching, dressing, walking the dog, etc with less difficulty.    Baseline up to 6/10 (01/04/2021);    Time 12    Period Weeks    Status New      PT LONG TERM GOAL #5   Title Improve B shoulder strength to equal or greater than 4+/5 with no increase in pain to improve patient's ability to complete functional tasks such as pulling, vacuuming, and walking the dog with less difficulty.    Baseline as low as 4/5 with pain - see objective exam (01/04/2021);    Time 12    Period Weeks    Status New      Additional Long Term Goals   Additional Long Term Goals Yes      PT LONG TERM GOAL #6   Title Complete community, work and/or recreational activities without limitation due to current condition.    Baseline Difficulty with activities using R UE such as reaching, dressing, bathing, sleeping, getting out of the tub/shower while pulling on bar with R UE, vacuuming, using a blower, sweeping, grooming, reaching in her car, carrying groceries, closing car hatch, walking dog. Avoids use of right arm (01/04/2021);    Time 12    Period Weeks     Status New                   Plan - 02/14/21 1306     Clinical Impression Statement Patient tolerated treatment well overall and reports decreased pain by end of session. Re-introduced scapular row with good tolerance and patient was able to perform more advanced bird dog today for shoulder stability. Patient gets good relief from [facility] and may benefit from dry needling (discussed with patient and she is ambivalent at this point). Patient would benefit from continued management of limiting condition by skilled physical therapist to address remaining impairments and functional limitations to work towards stated goals and return to PLOF or maximal functional independence.    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    Examination-Activity Limitations Hygiene/Grooming;Lift;Reach Overhead;Carry;Dressing    Examination-Participation Restrictions Cleaning;Community Activity;Occupation;Yard Work   activities using R UE such as reaching, dressing, bathing, sleeping, getting out of the tub/shower while pulling on bar with R UE, vacuuming, using a blower, sweeping, grooming, reaching in her car, carrying groceries, closing car hatch, walking dog   Stability/Clinical Decision Making Stable/Uncomplicated    Rehab Potential Good    PT Frequency 2x / week    PT Duration 12 weeks    PT Treatment/Interventions ADLs/Self Care Home Management;Cryotherapy;Electrical Stimulation;Moist Heat;Therapeutic activities;Therapeutic exercise;Neuromuscular  re-education;Manual techniques;Dry needling;Passive range of motion  PT Next Visit Plan update HEP as appropriate,  pregressive postural and shoulder girdle strenthening as tolerated, manual therapy as needed    PT [place] Access Code: [email or web address]     [email or web address] [email or web address]    Consulted and Agree with Plan of Care Patient             Patient will benefit from skilled therapeutic intervention in order to improve the following deficits and impairments:  Pain, Increased muscle spasms, Decreased activity tolerance, Postural dysfunction, Decreased endurance, Decreased range of motion, Decreased strength, Hypomobility, Impaired perceived functional ability, Impaired UE functional use  Visit Diagnosis: Chronic right shoulder pain     Problem List Patient Active Problem List   Diagnosis Date Noted   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   Zenker's diverticulum - asymptomatic 03/27/2019   Functional dyspepsia 03/27/2019   Irritable bowel syndrome with diarrhea 03/27/2019   Alopecia 05/14/2018   Lung nodule 03/30/2018   Osteopenia of multiple sites 10/10/2016   Nausea 10/08/2016   Need for vaccination 12/28/2015   GAD (generalized anxiety disorder) 09/29/2015   Hematuria, microscopic 09/29/2015   Onychomycosis of toenail 09/29/2015   Anxiety 09/21/2014   Mixed hyperlipidemia 02/22/2014   Abnormal blood chemistry 12/25/2012   Abnormal chest CT 12/25/2012   Flushing reaction 11/19/2012   Gastroesophageal reflux disease 11/19/2012   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 Graylon Good, PT, DPT 02/14/21, 1:09 PM   [facility] [address] [place], [address] Phone: [phone]   Fax:  [phone]  Name: Lark Langenfeld MRN: [id number] Date of Birth: [birth year]

## 2021-02-19 ENCOUNTER — Encounter: Payer: Medicare PPO | Admitting: Physical Therapy

## 2021-02-21 ENCOUNTER — Encounter: Payer: Medicare PPO | Admitting: Physical Therapy

## 2021-02-26 ENCOUNTER — Ambulatory Visit: Payer: Medicare PPO | Admitting: Physical Therapy

## 2021-02-28 ENCOUNTER — Encounter: Payer: Medicare PPO | Admitting: Physical Therapy

## 2021-03-05 ENCOUNTER — Ambulatory Visit: Payer: Medicare PPO | Admitting: Physical Therapy

## 2021-03-05 ENCOUNTER — Encounter: Payer: Self-pay | Admitting: Physical Therapy

## 2021-03-05 DIAGNOSIS — M25511 Pain in right shoulder: Secondary | ICD-10-CM

## 2021-03-05 NOTE — Therapy (Signed)
[facility] [address], [place], [address] Phone: [phone]   Fax:  [phone]  Physical Therapy Treatment  Patient Details  Name: Alison Bradshaw MRN: [id number] Date of Birth: 1940-07-19 Referring Provider (PT): Rosalia Hammers, DO   Encounter Date: 03/05/2021   PT End of Session - 03/05/21 1433     Visit Number 8    Number of Visits 24    Date for PT Re-Evaluation 03/29/21    Authorization Type [employer]IC[employer] reporting period from 01/04/2021    Authorization Time Period 24 visits 10/3 - 12/30  Authorization #[id number]    Authorization - Visit Number 7    Authorization - Number of Visits 24    Progress Note Due on Visit 10    PT Start Time 1034    PT Stop Time 1115    PT Time Calculation (min) 41 min    Activity Tolerance Patient tolerated treatment well    Behavior During Therapy [facility] for tasks assessed/performed             Past Medical History:  Diagnosis Date   Anxiety    Asthma    Atypical chest pain    Barrett's esophagus    Chronic gastritis    Diverticulosis    Duodenal ulcer    Gastritis    GERD (gastroesophageal reflux disease)    Hiatal hernia    History of echocardiogram    a. 08/2020 Echo: EF 60-65%, no rwma, Nl RV fxn, mild MR.   Hyperlipidemia    Non-obstructive CAD (coronary artery disease)    a. 10/2017 Stress Echo: Nl EF, no ischemia. Mild MR; b. 12/2017 Cardiac CTA: LM nl, LAD 50p, 30d, D1/2 nl, RI nl, LCX nl, OM1/2 nl, RCA nl, RPDA/RPL nl. Ca2+ score = 39 (46th percentile)-->statin dose escalated.   Osteopenia    PSVT (paroxysmal supraventricular tachycardia) ([place])    a. 10/2017 Holter: PSVT, max 5 beats->did not tolerate beta blocker; b. 09/2020 Zio: 38 runs of SVT. Longest 18.1 secs (106), fastest 203 bpm (6 beats). Occas PVCs (1.1%) - some assoc w/ triggered events.   Pulmonary nodules    a. 12/2017 RLL 60mm nodule noted on Cardiac CTA; b. 04/2018 RLL 41mm nodule  resolved; c. 09/2018 CT Chest: Previously noted posterior RUL nodules almost completely resolved. Bandlike scarring of RML and lingula-->consistent w/ improved atypical infxt.   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    Past Surgical History:  Procedure Laterality Date   BREAST BIOPSY Left 2008   neg   BREAST EXCISIONAL BIOPSY Left 2004   neg   CATARACT EXTRACTION W/ INTRAOCULAR LENS  IMPLANT, BILATERAL Bilateral    COLONOSCOPY WITH PROPOFOL N/A 01/22/2017   Procedure: COLONOSCOPY WITH PROPOFOL;  Surgeon: Toledo, Benay Pike, MD;  Location: [facility] ENDOSCOPY;  Service: Gastroenterology;  Laterality: N/A;   ESOPHAGOGASTRODUODENOSCOPY (EGD) WITH PROPOFOL N/A 01/20/2015   Procedure: ESOPHAGOGASTRODUODENOSCOPY (EGD) WITH PROPOFOL;  Surgeon: Lollie Sails, MD;  Location: [facility] ENDOSCOPY;  Service: Endoscopy;  Laterality: N/A;   ESOPHAGOGASTRODUODENOSCOPY (EGD) WITH PROPOFOL N/A 01/22/2017   Procedure: ESOPHAGOGASTRODUODENOSCOPY (EGD) WITH PROPOFOL;  Surgeon: Toledo, Benay Pike, MD;  Location: [facility] ENDOSCOPY;  Service: Gastroenterology;  Laterality: N/A;   ESOPHAGOGASTRODUODENOSCOPY (EGD) WITH PROPOFOL N/A 04/14/2018   Procedure: ESOPHAGOGASTRODUODENOSCOPY (EGD) WITH PROPOFOL;  Surgeon: Lollie Sails, MD;  Location: [facility] ENDOSCOPY;  Service: Endoscopy;  Laterality: N/A;   EYE  SURGERY Right    laser scraping   OPEN REDUCTION INTERNAL FIXATION (ORIF) DISTAL RADIAL FRACTURE Right 02/09/2018   Procedure: OPEN REDUCTION INTERNAL FIXATION (ORIF) DISTAL RADIAL FRACTURE;  Surgeon: Hessie Knows, MD;  Location: [facility] ORS;  Service: Orthopedics;  Laterality: Right;   TONSILLECTOMY      There were no vitals filed for this visit.   Subjective Assessment - 03/05/21 1041     Subjective Patient reports she had a fall while playing pickle ball in the last 2 weeks. She banged her head and hurt her left shoulder. Her shoulder  is better and no longer hurts. She had a sore neck and a headache for a couple of days but it is gone now. She is wondering about pickleball. She has had a small trip during play a while back and also at a door jam. She is concerned about injuring herself with pickleball and is considering not playing anymore so she can protect her wrists and play piano. She thinks she could do more yoga and walking and hiking instead. She is wondering if she is getting a disconnect between her mind and her body. She feels her right foot just doesn't seem to move where she thought it was sometimes. Her right shoulder continues to hurt for a few days and be better for a few days. Her right shoulder hurts up to 3/10 when she pulls it across her body. She has been doing her HEP and feels it has helped her have less pain overall.    Pertinent History Patient is a 80 y.o. female who presents to outpatient physical therapy with a referral for medical diagnosis acute pain of right shoulder, impingement syndrome of right shoulder, biceps tendinitis of right upper extremity, arthritis of right acromioclavicular joint. This patient's chief complaints consist of right lateral shoulder pain with activity with discomfort in upper trap region leading to the following functional deficits: Difficulty with activities using R UE such as reaching, dressing, bathing, sleeping, getting out of the tub/shower while pulling on bar with R UE, vacuuming, using a blower, sweeping, grooming, reaching in her car, carrying groceries, closing car hatch, walking dog. Avoids use of right arm. .  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  Patient  denies hx of cancer, stroke, seizures, lung problem besides seasonal asthma, major cardiac events (except irregular heart beat at times - followed by cardiologist), diabetes, unexplained weight loss, changes in bowel or bladder problems, new onset stumbling or dropping things, neck problems, spinal surgery.    Limitations Lifting;House hold activities;Other (comment)   ctivities using R UE such as reaching, dressing, bathing, sleeping, getting out of the tub/shower while pulling on bar with R UE, vacuuming, using a blower, sweeping, grooming, reaching in her car, carrying groceries, closing car hatch, walking dog   Diagnostic tests Right shoulder xray report 11/23/2020: "FINDINGS:     Type II acromion.  Mild-moderate AC joint degenerative change with joint   space narrowing, osteophyte formation.  The distal clavicle, scapula, and   proximal humerus are intact.  Glenohumeral and acromioclavicular joint   alignment appears normal.  No fractures or dislocations.  Well maintained   glenohumeral joint space without evidence of degenerative joint disease.     No soft tissue swelling or joint  effusion.  No destructive bony lesion is   identified."    Patient Stated Goals would like to know how to help herself either heal or cope with her problem soas not to make it worse or irritated.    Currently in Pain? Yes    Pain Score 3     Pain Onset More than a month ago              TREATMENT:    Therapeutic exercise: to centralize symptoms and improve ROM, strength, muscular endurance, and activity tolerance required for successful completion of functional activities.  - Upper body ergometer with no added resistance encourage joint nutrition, warm tissue, induce analgesic effect of aerobic exercise, improve muscular strength and endurance,  and prepare for remainder of session. 1x5 min switching directions every 1 min.  - sit <> stand from 18 inch chair, 1x10 (easy) - squat with buttock tap on 18 inch chair,  1x10, 1x5 (more challenging, feels burning in quads).  - runner's step up at bottom step of stairs (6 inch step)  with touchdown UE support as needed. 1x20 each side (rates easy).  - lateral runner's step up at bottom step of stairs (6 inch step) with touchdown UE support as needed, 1x10 each side (more challenging for balance and strength).  - floor <> stand transfers self selected using various kneeling technique ~ 1x10. - prone to stand 1x6 on floor mat (started to bother R shoulder at 6th rep so discontinued).  - standing reactive isomeric R shoulder ER and IR, 1x10 each direction, with YTB and towel roll under arm.  - Education on HEP including handout   Manual therapy: to reduce pain and tissue tension, improve range of motion, neuromodulation, in order to promote improved ability to complete functional activities. SUPINE - STM to right upper trap, levator scap, and posterior rotator cuff. Pressure to R infraspinatus feels good.   Pt required multimodal cuing for proper technique and to facilitate improved neuromuscular control, strength, range of motion, and functional ability resulting in improved performance and form.    HOME EXERCISE PROGRAM Access Code: [email or web address] URL: [email or web address] Date: 03/05/2021 Prepared by: Rosita Kea  Exercises Supine Serratus Punches Resistance - 1 x daily - 3 sets - 10 reps Sidelying Shoulder Abduction Palm Forward - 1 x daily - 2-3 sets - 10 reps Standing Isometric Shoulder Abduction with Doorway - Arm Bent - 1 x daily - 2 sets - 10 reps - 4 seconds hold Standing Isometric Shoulder Flexion with Doorway - Arm Bent - 1 x daily - 2 sets - 10 reps - 5 seconds hold Row with band/cable - 1 x daily - 3 sets - 10 reps - 2 seconds hold Bird Dog - 1 x daily - 3 sets - 10 reps - 5 second hold Shoulder External Rotation Reactive Isometrics - 1 x daily - 3 sets - 10 reps Shoulder Internal Rotation Reactive Isometrics - 1 x daily - 3 sets - 10  reps Squat with Chair Touch - 1 x daily - 3 sets - 10 reps Runner's Step Up/Down - 1 x daily - 3 sets - 10 reps    [email or web address] [id number]    WRIST EXTENSION STRETCH - TABLE -  Repeat 20 Times, Hold 5 Seconds, Complete 1 Set, Perform 2 Times a Day    PT Education - 03/05/21 1433     Education Details exercise/intevention purpose/technique. LE strengthening/balance    Person(s) Educated Patient    Methods Explanation;Demonstration;Tactile cues;Verbal cues;Handout  Comprehension Verbalized understanding;Returned demonstration;Verbal cues required;Tactile cues required;Need further instruction              PT Short Term Goals - 02/14/21 1308       PT SHORT TERM GOAL #1   Title Be independent with initial home exercise program for self-management of symptoms.    Baseline Initial HEP to be provided visit 2 (01/04/2021);    Time 2    Period Weeks    Status Achieved    Target Date 01/18/21               PT Long Term Goals - 01/04/21 1545       PT LONG TERM GOAL #1   Title Be independent with a long-term home exercise program for self-management of symptoms.    Baseline Intial HEP to be provided at visit 2 (01/04/2021);    Time 12    Period Weeks    Status New   TARGET DATE FOR ALL LONG TERM GOALS: 03/29/2021     PT LONG TERM GOAL #2   Title Demonstrate improved FOTO to equal or greater than 61 by visit #13 to demonstrate improvement in overall condition and self-reported functional ability.    Baseline 41 (01/04/2021);    Time 12    Period Weeks    Status New      PT LONG TERM GOAL #3   Title Have full R shoulder AROM with no compensations or increase in pain in all planes except intermittent end range discomfort to improve ability  to complete valued activities such reaching, carrying, dressing, etc.    Baseline limited and painful - see objective exam (01/04/2021);    Time 12    Period Weeks    Status New      PT LONG TERM GOAL #4   Title Reduce pain with  functional activities to equal or less than 1/10 to allow patient to complete usual activities including reaching, dressing, walking the dog, etc with less difficulty.    Baseline up to 6/10 (01/04/2021);    Time 12    Period Weeks    Status New      PT LONG TERM GOAL #5   Title Improve B shoulder strength to equal or greater than 4+/5 with no increase in pain to improve patient's ability to complete functional tasks such as pulling, vacuuming, and walking the dog with less difficulty.    Baseline as low as 4/5 with pain - see objective exam (01/04/2021);    Time 12    Period Weeks    Status New      Additional Long Term Goals   Additional Long Term Goals Yes      PT LONG TERM GOAL #6   Title Complete community, work and/or recreational activities without limitation due to current condition.    Baseline Difficulty with activities using R UE such as reaching, dressing, bathing, sleeping, getting out of the tub/shower while pulling on bar with R UE, vacuuming, using a blower, sweeping, grooming, reaching in her car, carrying groceries, closing car hatch, walking dog. Avoids use of right arm (01/04/2021);    Time 12    Period Weeks    Status New                   Plan - 03/05/21 1439     Clinical Impression Statement Patient tolerated treatment well overall. Addressed exercises for LE/functional strength and balance after patient expressed concerns about these aspects of her  fitness following fall while playing pickleball. Also updated shoulder HEP to include dynamic isometrics with better tolerance. Patient has been away from PT for 2 weeks related to her fall. She reports overall improvement in her R shoulder function but continues to be limited by pain. Patient would benefit from continued management of limiting condition by skilled physical therapist to address remaining impairments and functional limitations to work towards stated goals and return to PLOF or maximal functional  independence.    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    Examination-Activity Limitations Hygiene/Grooming;Lift;Reach Overhead;Carry;Dressing    Examination-Participation Restrictions Cleaning;Community Activity;Occupation;Yard Work   activities using R UE such as reaching, dressing, bathing, sleeping, getting out of the tub/shower while pulling on bar with R UE, vacuuming, using a blower, sweeping, grooming, reaching in her car, carrying groceries, closing car hatch, walking dog   Stability/Clinical Decision Making Stable/Uncomplicated    Rehab Potential Good    PT Frequency 2x / week    PT Duration 12 weeks    PT Treatment/Interventions ADLs/Self Care Home Management;Cryotherapy;Electrical Stimulation;Moist Heat;Therapeutic activities;Therapeutic exercise;Neuromuscular re-education;Manual techniques;Dry needling;Passive range of motion    PT Next Visit Plan update HEP as appropriate,  pregressive postural and shoulder girdle strenthening as tolerated, manual therapy as needed    PT [place] Access Code: [email or web address]     [email or web address] [email or web address]    Consulted and Agree with Plan of Care Patient             Patient will benefit from skilled therapeutic intervention in order to improve the following deficits and impairments:  Pain, Increased muscle spasms, Decreased activity tolerance, Postural dysfunction, Decreased endurance, Decreased range of motion, Decreased strength, Hypomobility, Impaired perceived functional ability,  Impaired UE functional use  Visit Diagnosis: Chronic right shoulder pain     Problem List Patient Active Problem List   Diagnosis Date Noted   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   Zenker's diverticulum - asymptomatic 03/27/2019   Functional dyspepsia 03/27/2019   Irritable bowel syndrome with diarrhea 03/27/2019   Alopecia 05/14/2018   Lung nodule 03/30/2018   Osteopenia of multiple sites 10/10/2016   Nausea 10/08/2016   Need for vaccination 12/28/2015   GAD (generalized anxiety disorder) 09/29/2015   Hematuria, microscopic 09/29/2015   Onychomycosis of toenail 09/29/2015   Anxiety 09/21/2014   Mixed hyperlipidemia 02/22/2014   Abnormal blood chemistry 12/25/2012   Abnormal chest CT 12/25/2012   Flushing reaction 11/19/2012   Gastroesophageal reflux disease 11/19/2012   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 Graylon Good, PT, DPT 03/05/21, 2:40 PM   [facility] [address], [place], [address] Phone: [phone]   Fax:  [phone]  Name: Alison Bradshaw MRN: [id number] Date of Birth: 08/05/1940

## 2021-03-07 ENCOUNTER — Ambulatory Visit: Payer: Medicare PPO | Admitting: Physical Therapy

## 2021-03-07 DIAGNOSIS — M25511 Pain in right shoulder: Secondary | ICD-10-CM

## 2021-03-07 DIAGNOSIS — G8929 Other chronic pain: Secondary | ICD-10-CM

## 2021-03-07 NOTE — Therapy (Signed)
[facility] [address], [place], [address] Phone: [phone]   Fax:  [phone]  Physical Therapy Treatment  Patient Details  Name: Alison Bradshaw MRN: [id number] Date of Birth: [birth year] Referring Provider (PT): Rosalia Hammers, DO   Encounter Date: 03/07/2021   PT End of Session - 03/07/21 1418     Visit Number 9    Number of Visits 24    Date for PT Re-Evaluation 03/29/21    Authorization Type [employer]IC[employer] reporting period from 01/04/2021    Authorization Time Period 24 visits 10/3 - 12/30  Authorization #[id number]    Authorization - Visit Number 8    Authorization - Number of Visits 24    Progress Note Due on Visit 10    PT Start Time 1035    PT Stop Time 1115    PT Time Calculation (min) 40 min    Activity Tolerance Patient tolerated treatment well    Behavior During Therapy [facility] for tasks assessed/performed             Past Medical History:  Diagnosis Date   Anxiety    Asthma    Atypical chest pain    Barrett's esophagus    Chronic gastritis    Diverticulosis    Duodenal ulcer    Gastritis    GERD (gastroesophageal reflux disease)    Hiatal hernia    History of echocardiogram    a. 08/2020 Echo: EF 60-65%, no rwma, Nl RV fxn, mild MR.   Hyperlipidemia    Non-obstructive CAD (coronary artery disease)    a. 10/2017 Stress Echo: Nl EF, no ischemia. Mild MR; b. 12/2017 Cardiac CTA: LM nl, LAD 50p, 30d, D1/2 nl, RI nl, LCX nl, OM1/2 nl, RCA nl, RPDA/RPL nl. Ca2+ score = 39 (46th percentile)-->statin dose escalated.   Osteopenia    PSVT (paroxysmal supraventricular tachycardia) ([place])    a. 10/2017 Holter: PSVT, max 5 beats->did not tolerate beta blocker; b. 09/2020 Zio: 38 runs of SVT. Longest 18.1 secs (106), fastest 203 bpm (6 beats). Occas PVCs (1.1%) - some assoc w/ triggered events.   Pulmonary nodules    a. 12/2017 RLL 57mm nodule noted on Cardiac CTA; b. 04/2018 RLL 64mm nodule  resolved; c. 09/2018 CT Chest: Previously noted posterior RUL nodules almost completely resolved. Bandlike scarring of RML and lingula-->consistent w/ improved atypical infxt.   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    Past Surgical History:  Procedure Laterality Date   BREAST BIOPSY Left 2008   neg   BREAST EXCISIONAL BIOPSY Left 2004   neg   CATARACT EXTRACTION W/ INTRAOCULAR LENS  IMPLANT, BILATERAL Bilateral    COLONOSCOPY WITH PROPOFOL N/A 01/22/2017   Procedure: COLONOSCOPY WITH PROPOFOL;  Surgeon: Toledo, Benay Pike, MD;  Location: [facility] ENDOSCOPY;  Service: Gastroenterology;  Laterality: N/A;   ESOPHAGOGASTRODUODENOSCOPY (EGD) WITH PROPOFOL N/A 01/20/2015   Procedure: ESOPHAGOGASTRODUODENOSCOPY (EGD) WITH PROPOFOL;  Surgeon: Lollie Sails, MD;  Location: [facility] ENDOSCOPY;  Service: Endoscopy;  Laterality: N/A;   ESOPHAGOGASTRODUODENOSCOPY (EGD) WITH PROPOFOL N/A 01/22/2017   Procedure: ESOPHAGOGASTRODUODENOSCOPY (EGD) WITH PROPOFOL;  Surgeon: Toledo, Benay Pike, MD;  Location: [facility] ENDOSCOPY;  Service: Gastroenterology;  Laterality: N/A;   ESOPHAGOGASTRODUODENOSCOPY (EGD) WITH PROPOFOL N/A 04/14/2018   Procedure: ESOPHAGOGASTRODUODENOSCOPY (EGD) WITH PROPOFOL;  Surgeon: Lollie Sails, MD;  Location: [facility] ENDOSCOPY;  Service: Endoscopy;  Laterality: N/A;   EYE  SURGERY Right    laser scraping   OPEN REDUCTION INTERNAL FIXATION (ORIF) DISTAL RADIAL FRACTURE Right 02/09/2018   Procedure: OPEN REDUCTION INTERNAL FIXATION (ORIF) DISTAL RADIAL FRACTURE;  Surgeon: Hessie Knows, MD;  Location: [facility] ORS;  Service: Orthopedics;  Laterality: Right;   TONSILLECTOMY      There were no vitals filed for this visit.   TREATMENT:    Therapeutic exercise: to centralize symptoms and improve ROM, strength, muscular endurance, and activity tolerance required for successful completion of functional activities.   - NuStep level 1 using bilateral upper and lower extremities. Seat/handle setting 9/11. For improved extremity mobility, muscular endurance, and activity tolerance; and to induce the analgesic effect of aerobic exercise, stimulate improved joint nutrition, and prepare body structures and systems for following interventions. x 6  minutes. Average SPM = 64.  - AAROM R shoulder wall slide into flexion with pillow case, 1x15 - standing reactive isomeric R shoulder ER and IR, 3x10 each direction, with YTB and towel roll under arm. good carry over from last session.  - quadruped bird dog, 3x10 each side. Pillow case roll under thenar region of R hand to decrease wrist extension strain.    Manual therapy: to reduce pain and tissue tension, improve range of motion, neuromodulation, in order to promote improved ability to complete functional activities. SUPINE - STM to right upper trap, levator scap, rhomboids, and posterior rotator cuff.  - R GH joint mobilization grade I-II distraction and AP.    Pt required multimodal cuing for proper technique and to facilitate improved neuromuscular control, strength, range of motion, and functional ability resulting in improved performance and form.    HOME EXERCISE PROGRAM Access Code: [email or web address] URL: [email or web address] Date: 03/05/2021 Prepared by: Rosita Kea   Exercises Supine Serratus Punches Resistance - 1 x daily - 3 sets - 10 reps Sidelying Shoulder Abduction Palm Forward - 1 x daily - 2-3 sets - 10 reps Standing Isometric Shoulder Abduction with Doorway - Arm Bent - 1 x daily - 2 sets - 10 reps - 4 seconds hold Standing Isometric Shoulder Flexion with Doorway - Arm Bent - 1 x daily - 2 sets - 10 reps - 5 seconds hold Row with band/cable - 1 x daily - 3 sets - 10 reps - 2 seconds hold Bird Dog - 1 x daily - 3 sets - 10 reps - 5 second hold Shoulder External Rotation Reactive Isometrics - 1 x daily - 3 sets - 10 reps Shoulder Internal  Rotation Reactive Isometrics - 1 x daily - 3 sets - 10 reps Squat with Chair Touch - 1 x daily - 3 sets - 10 reps Runner's Step Up/Down - 1 x daily - 3 sets - 10 reps     [email or web address] [id number]    WRIST EXTENSION STRETCH - TABLE -  Repeat 20 Times, Hold 5 Seconds, Complete 1 Set, Perform 2 Times a Day    PT Education - 03/07/21 1417     Education Details exercise/intevention purpose/technique. DOMS. motivational interviewing techniques for patient's goals of getting to bed early to get up earlier to exercise in the morning.    Person(s) Educated Patient    Methods Explanation;Demonstration;Tactile cues;Verbal cues    Comprehension Returned demonstration;Verbal cues required;Verbalized understanding;Tactile cues required;Need further instruction              PT Short Term Goals - 02/14/21 1308       PT SHORT TERM GOAL #1   Title Be independent with initial home  exercise program for self-management of symptoms.    Baseline Initial HEP to be provided visit 2 (01/04/2021);    Time 2    Period Weeks    Status Achieved    Target Date 01/18/21               PT Long Term Goals - 01/04/21 1545       PT LONG TERM GOAL #1   Title Be independent with a long-term home exercise program for self-management of symptoms.    Baseline Intial HEP to be provided at visit 2 (01/04/2021);    Time 12    Period Weeks    Status New   TARGET DATE FOR ALL LONG TERM GOALS: 03/29/2021     PT LONG TERM GOAL #2   Title Demonstrate improved FOTO to equal or greater than 61 by visit #13 to demonstrate improvement in overall condition and self-reported functional ability.    Baseline 41 (01/04/2021);    Time 12    Period Weeks    Status New      PT LONG TERM GOAL #3   Title Have full R shoulder AROM with no compensations or increase in pain in all planes except intermittent end range discomfort to improve ability  to complete valued activities such reaching, carrying, dressing, etc.    Baseline  limited and painful - see objective exam (01/04/2021);    Time 12    Period Weeks    Status New      PT LONG TERM GOAL #4   Title Reduce pain with functional activities to equal or less than 1/10 to allow patient to complete usual activities including reaching, dressing, walking the dog, etc with less difficulty.    Baseline up to 6/10 (01/04/2021);    Time 12    Period Weeks    Status New      PT LONG TERM GOAL #5   Title Improve B shoulder strength to equal or greater than 4+/5 with no increase in pain to improve patient's ability to complete functional tasks such as pulling, vacuuming, and walking the dog with less difficulty.    Baseline as low as 4/5 with pain - see objective exam (01/04/2021);    Time 12    Period Weeks    Status New      Additional Long Term Goals   Additional Long Term Goals Yes      PT LONG TERM GOAL #6   Title Complete community, work and/or recreational activities without limitation due to current condition.    Baseline Difficulty with activities using R UE such as reaching, dressing, bathing, sleeping, getting out of the tub/shower while pulling on bar with R UE, vacuuming, using a blower, sweeping, grooming, reaching in her car, carrying groceries, closing car hatch, walking dog. Avoids use of right arm (01/04/2021);    Time 12    Period Weeks    Status New                   Plan - 03/07/21 1416     Clinical Impression Statement Patient tolerated treatment well overall with no increase in pain by end of session. Demo good carry over for dynamic isometrics and needed minimal cuing for form. Noted for significant improvement in R shoulder stability and weight bearing tolerance during bird dog exercise. Patient continues to be limited by R shoulder pain and has not returned to PLOF. Patient would benefit from continued management of limiting condition by skilled physical  therapist to address remaining impairments and functional limitations to work  towards stated goals and return to PLOF or maximal functional independence.    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    Examination-Activity Limitations Hygiene/Grooming;Lift;Reach Overhead;Carry;Dressing    Examination-Participation Restrictions Cleaning;Community Activity;Occupation;Yard Work   activities using R UE such as reaching, dressing, bathing, sleeping, getting out of the tub/shower while pulling on bar with R UE, vacuuming, using a blower, sweeping, grooming, reaching in her car, carrying groceries, closing car hatch, walking dog   Stability/Clinical Decision Making Stable/Uncomplicated    Rehab Potential Good    PT Frequency 2x / week    PT Duration 12 weeks    PT Treatment/Interventions ADLs/Self Care Home Management;Cryotherapy;Electrical Stimulation;Moist Heat;Therapeutic activities;Therapeutic exercise;Neuromuscular re-education;Manual techniques;Dry needling;Passive range of motion    PT Next Visit Plan update HEP as appropriate,  pregressive postural and shoulder girdle strenthening as tolerated, manual therapy as needed    PT [place] Access Code: [email or web address]     [email or web address]    Consulted and Agree with Plan of Care Patient             Patient will benefit from skilled therapeutic intervention in order to improve the following deficits and impairments:  Pain, Increased muscle spasms, Decreased activity tolerance, Postural dysfunction, Decreased endurance, Decreased range of motion, Decreased  strength, Hypomobility, Impaired perceived functional ability, Impaired UE functional use  Visit Diagnosis: Chronic right shoulder pain     Problem List Patient Active Problem List   Diagnosis Date Noted   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   Zenker's diverticulum - asymptomatic 03/27/2019   Functional dyspepsia 03/27/2019   Irritable bowel syndrome with diarrhea 03/27/2019   Alopecia 05/14/2018   Lung nodule 03/30/2018   Osteopenia of multiple sites 10/10/2016   Nausea 10/08/2016   Need for vaccination 12/28/2015   GAD (generalized anxiety disorder) 09/29/2015   Hematuria, microscopic 09/29/2015   Onychomycosis of toenail 09/29/2015   Anxiety 09/21/2014   Mixed hyperlipidemia 02/22/2014   Abnormal blood chemistry 12/25/2012   Abnormal chest CT 12/25/2012   Flushing reaction 11/19/2012   Gastroesophageal reflux disease 11/19/2012   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   Multiple gastric polyps 11/02/2012   Clarise Cruz R. Graylon Good, PT, DPT 03/07/21, 2:19 PM   [facility] [address], [place], [address] Phone: [phone]   Fax:  [phone]  Name: Katheleen Stella MRN: [id number] Date of Birth: Jun 04, 1940

## 2021-03-12 ENCOUNTER — Ambulatory Visit: Payer: Medicare PPO | Attending: Sports Medicine | Admitting: Physical Therapy

## 2021-03-12 ENCOUNTER — Encounter: Payer: Self-pay | Admitting: Physical Therapy

## 2021-03-12 DIAGNOSIS — G8929 Other chronic pain: Secondary | ICD-10-CM | POA: Diagnosis present

## 2021-03-12 DIAGNOSIS — M25511 Pain in right shoulder: Secondary | ICD-10-CM | POA: Diagnosis present

## 2021-03-12 NOTE — Therapy (Signed)
[facility] [address] [place], [address] Phone: [phone]   Fax:  [phone]  Physical Therapy Treatment / Progress Note Dates of reporting from 01/04/2021 to 03/12/2021  Patient Details  Name: Alison Bradshaw MRN: [id number] Date of Birth: [birth year] Referring Provider (PT): Rosalia Hammers, DO   Encounter Date: 03/12/2021   PT End of Session - 03/12/21 1206     Visit Number 10    Number of Visits 24    Date for PT Re-Evaluation 03/29/21    Authorization Type [employer] reporting period from 01/04/2021    Authorization Time Period 24 visits 10/3 - 12/30  Authorization #[id number]    Authorization - Visit Number 9    Authorization - Number of Visits 24    Progress Note Due on Visit 10    PT Start Time 1032    PT Stop Time 1115    PT Time Calculation (min) 43 min    Activity Tolerance Patient tolerated treatment well    Behavior During Therapy [facility] for tasks assessed/performed             Past Medical History:  Diagnosis Date   Anxiety    Asthma    Atypical chest pain    Barrett's esophagus    Chronic gastritis    Diverticulosis    Duodenal ulcer    Gastritis    GERD (gastroesophageal reflux disease)    Hiatal hernia    History of echocardiogram    a. 08/2020 Echo: EF 60-65%, no rwma, Nl RV fxn, mild MR.   Hyperlipidemia    Non-obstructive CAD (coronary artery disease)    a. 10/2017 Stress Echo: Nl EF, no ischemia. Mild MR; b. 12/2017 Cardiac CTA: LM nl, LAD 50p, 30d, D1/2 nl, RI nl, LCX nl, OM1/2 nl, RCA nl, RPDA/RPL nl. Ca2+ score = 39 (46th percentile)-->statin dose escalated.   Osteopenia    PSVT (paroxysmal supraventricular tachycardia) ([place])    a. 10/2017 Holter: PSVT, max 5 beats->did not tolerate beta blocker; b. 09/2020 Zio: 38 runs of SVT. Longest 18.1 secs (106), fastest 203 bpm (6 beats). Occas PVCs (1.1%) - some assoc w/ triggered events.   Pulmonary nodules    a.  12/2017 RLL 63mm nodule noted on Cardiac CTA; b. 04/2018 RLL 85mm nodule resolved; c. 09/2018 CT Chest: Previously noted posterior RUL nodules almost completely resolved. Bandlike scarring of RML and lingula-->consistent w/ improved atypical infxt.   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    Past Surgical History:  Procedure Laterality Date   BREAST BIOPSY Left 2008   neg   BREAST EXCISIONAL BIOPSY Left 2004   neg   CATARACT EXTRACTION W/ INTRAOCULAR LENS  IMPLANT, BILATERAL Bilateral    COLONOSCOPY WITH PROPOFOL N/A 01/22/2017   Procedure: COLONOSCOPY WITH PROPOFOL;  Surgeon: Toledo, Benay Pike, MD;  Location: [facility] ENDOSCOPY;  Service: Gastroenterology;  Laterality: N/A;   ESOPHAGOGASTRODUODENOSCOPY (EGD) WITH PROPOFOL N/A 01/20/2015   Procedure: ESOPHAGOGASTRODUODENOSCOPY (EGD) WITH PROPOFOL;  Surgeon: Lollie Sails, MD;  Location: [facility] ENDOSCOPY;  Service: Endoscopy;  Laterality: N/A;   ESOPHAGOGASTRODUODENOSCOPY (EGD) WITH PROPOFOL N/A 01/22/2017   Procedure: ESOPHAGOGASTRODUODENOSCOPY (EGD) WITH PROPOFOL;  Surgeon: Toledo, Benay Pike, MD;  Location: [facility] ENDOSCOPY;  Service: Gastroenterology;  Laterality: N/A;   ESOPHAGOGASTRODUODENOSCOPY (EGD) WITH PROPOFOL N/A 04/14/2018   Procedure: ESOPHAGOGASTRODUODENOSCOPY (EGD) WITH PROPOFOL;  Surgeon: Lollie Sails, MD;  Location: [facility]  ENDOSCOPY;  Service: Endoscopy;  Laterality: N/A;   EYE SURGERY Right    laser scraping   OPEN REDUCTION INTERNAL FIXATION (ORIF) DISTAL RADIAL FRACTURE Right 02/09/2018   Procedure: OPEN REDUCTION INTERNAL FIXATION (ORIF) DISTAL RADIAL FRACTURE;  Surgeon: Hessie Knows, MD;  Location: [facility] ORS;  Service: Orthopedics;  Laterality: Right;   TONSILLECTOMY      There were no vitals filed for this visit.   Subjective Assessment - 03/12/21 1033     Subjective Patient reports she continues to have pain when she moves her right arm  across her chest (into horizontal shoulder adduction). Her R shoulder pain gets as high as 3/10. She thinks it is better overall since she started PT. She states she has noticed she his having some sharp pain at the right medial scapular area that seems seperate from her shoulder pain. She cannot provoke the pain but it sometimes shows up. It radiates down towards lower back. She does not notice any neck pain at that time. This has been happening since Friday. it has happened about 2x a day and has lasted for 5 min or less. Describes as prickly, grabby. She also has had a  runny nose that she has had all winter.  She noticed some shortness of breath or a heaviness in her chest when she is on her daily walk that she has noticed. She feels that PT has given her some good stratagies to help her decrease her pain but so far it has not gone away. She thinks it may not be fixable. She thinks she is making gradual progress over time. States if feels like her shoulder pain is "half healed."    Pertinent History Patient is a 80 y.o. female who presents to outpatient physical therapy with a referral for medical diagnosis acute pain of right shoulder, impingement syndrome of right shoulder, biceps tendinitis of right upper extremity, arthritis of right acromioclavicular joint. This patient's chief complaints consist of right lateral shoulder pain with activity with discomfort in upper trap region leading to the following functional deficits: Difficulty with activities using R UE such as reaching, dressing, bathing, sleeping, getting out of the tub/shower while pulling on bar with R UE, vacuuming, using a blower, sweeping, grooming, reaching in her car, carrying groceries, closing car hatch, walking dog. Avoids use of right arm. .  Relevant past medical history and comorbidities include right wrist fracture with ORIF, left face numbness (at time), left ear/hearing problem (vaccine reaction, improving, followed by neurology), R  knee pain, osteopenia, barrett's esophagus, anxiety,  former smoker, GERD, hiatal hernia, hyperlipidemia, CAD, overactive digestive system, reduced peripheral vision.  Patient denies hx of cancer, stroke, seizures, lung problem besides seasonal asthma, major cardiac events (except irregular heart beat at times - followed by cardiologist), diabetes, unexplained weight loss, changes in bowel or bladder problems, new onset stumbling or dropping things, neck problems, spinal surgery.    Limitations Lifting;House hold activities;Other (comment)   ctivities using R UE such as reaching, dressing, bathing, sleeping, getting out of the tub/shower while pulling on bar with R UE, vacuuming, using a blower, sweeping, grooming, reaching in her car, carrying groceries, closing car hatch, walking dog   Diagnostic tests Right shoulder xray report 11/23/2020: "FINDINGS:     Type II acromion.  Mild-moderate AC joint degenerative change with joint   space narrowing, osteophyte formation.  The distal clavicle, scapula, and   proximal humerus are intact.  Glenohumeral and acromioclavicular joint   alignment appears normal.  No  fractures or dislocations.  Well maintained   glenohumeral joint space without evidence of degenerative joint disease.     No soft tissue swelling or joint effusion.  No destructive bony lesion is   identified."    Patient Stated Goals would like to know how to help herself either heal or cope with her problem soas not to make it worse or irritated.    Currently in Pain? Yes    Pain Score 3     Pain Location Shoulder    Pain Onset More than a month ago    Effect of Pain on Daily Activities Functional Limitations: activities using R UE, reaching, dressing, bathing, sleeping, getting out of the tub/shower while pulling on bar with R UE, vacuuming, using a blower, sweeping, grooming, reaching in her car, carrying groceries, closing car hatch. Avoids use of right arm. Patient states she still protects her R  shoulder but the problems she has with these activities are not as "acute."            OBJECTIVE  SELF-REPORTED FUNCTION FOTO score: 56/100 (shoulder questionnaire)   PERIPHERAL JOINT MOTION (in degrees) Active Range of Motion (AROM) *Indicates pain 01/04/21 03/12/21 Date  Joint/Motion R/L R/L R/L  Shoulder        Flexion 160*/165 165/ /  Extension 52/60 55/ /  Abduction  162*/170 170*/ /  External rotation 100*/100 109*/ /  Internal rotation T10*/T7 T10*/ /  Comments:  01/04/2021: B elbow and wrists WFL, except slight stiffness noted at R wrist all directions.  03/12/2021: R shoulder abduction painful arc present.    MUSCLE PERFORMANCE (MMT):  *Indicates pain 01/04/21 03/12/21 Date  Joint/Motion R/L R/L R/L  Shoulder        Flexion 4*/4 4+*/4+ /  Abduction (C5) 4+*/4+ 4+/4+ /  External rotation 4+/4+ 5/4+ /  Internal rotation 5/5 5/5 /  Elbow        Flexion (C6) 5/5 5/5 /  Extension (C7) 4+/4+ 5/5 /       TREATMENT:    Therapeutic exercise: to centralize symptoms and improve ROM, strength, muscular endurance, and activity tolerance required for successful completion of functional activities.  - NuStep level 1 using bilateral upper and lower extremities. Seat/handle setting 9/11. For improved extremity mobility, muscular endurance, and activity tolerance; and to induce the analgesic effect of aerobic exercise, stimulate improved joint nutrition, and prepare body structures and systems for following interventions. x 5  minutes. Average SPM = 55 - AROM and MMT test to assess progress - see above  - AAROM R shoulder wall slide into flexion with pillow case, 1x10 - AAROM R shoulder wall slide into scaption with pillow case, 1x10 (attempted abduction but too painful). - standing R shoulder ER and IR with yellow theraband and towel roll under arm, 3x10, focus on teaching her how to perform at home. Limited IR at neutral due to uncomfortable grinding in [facility] joint with performing IR  past neutral.  - standing single arm scaption with 1# DB, 2x10 each side.  - Education on HEP including handout    Pt required multimodal cuing for proper technique and to facilitate improved neuromuscular control, strength, range of motion, and functional ability resulting in improved performance and form.    HOME EXERCISE PROGRAM Access Code: [email or web address] URL: [email or web address] Date: 03/12/2021 Prepared by: Rosita Kea  Exercises  Standing Isometric Shoulder Abduction with Doorway - Arm Bent - 1 x daily - 2 sets - 10 reps - 4 seconds hold Standing Isometric  Shoulder Flexion with Doorway - Arm Bent - 1 x daily - 2 sets - 10 reps - 5 seconds hold Shoulder Flexion Wall Slide with Towel - 1 x daily - 20 reps Row with band/cable - 1 x daily - 3 sets - 10 reps - 2 seconds hold Shoulder External Rotation with Anchored Resistance - 1 x daily - 3 sets - 10 reps Shoulder Internal Rotation with Resistance - 1 x daily - 3 sets - 10 reps Single Arm Scaption with Dumbbell - 1 x daily - 3 sets - 10 reps Bird Dog - 1 x daily - 3 sets - 10 reps - 5 second hold  [email or web address] [email or web address]    WRIST EXTENSION STRETCH - TABLE -  Repeat 20 Times, Hold 5 Seconds, Complete 1 Set, Perform 2 Times a Day    PT Education - 03/12/21 1714     Education Details exrcise/intevention, purpose/technique, progress, POC    Person(s) Educated Patient    Methods Explanation;Demonstration;Tactile cues;Verbal cues;Handout    Comprehension Verbalized understanding;Returned demonstration;Verbal cues required;Tactile cues required;Need further instruction              PT Short Term Goals - 02/14/21 1308       PT SHORT TERM GOAL #1   Title Be independent with initial home exercise program for self-management of symptoms.    Baseline Initial HEP to be provided visit 2 (01/04/2021);    Time 2    Period Weeks    Status Achieved    Target Date 01/18/21               PT Long Term Goals - 03/12/21 1719        PT LONG TERM GOAL #1   Title Be independent with a long-term home exercise program for self-management of symptoms.    Baseline Intial HEP to be provided at visit 2 (01/04/2021); patient participating in appropriate HEP (03/12/2021);    Time 12    Period Weeks    Status Partially Met   TARGET DATE FOR ALL LONG TERM GOALS: 03/29/2021     PT LONG TERM GOAL #2   Title Demonstrate improved FOTO to equal or greater than 61 by visit #13 to demonstrate improvement in overall condition and self-reported functional ability.    Baseline 41 (01/04/2021); 56 at visit 10 (03/12/2021);    Time 12    Period Weeks    Status Partially Met      PT LONG TERM GOAL #3   Title Have full R shoulder AROM with no compensations or increase in pain in all planes except intermittent end range discomfort to improve ability  to complete valued activities such reaching, carrying, dressing, etc.    Baseline limited and painful - see objective exam (01/04/2021); improved R shoulder AROM but still has painful arc in abduction and pain in ER and IR - see objective (03/12/2021);    Time 12    Period Weeks    Status Partially Met      PT LONG TERM GOAL #4   Title Reduce pain with functional activities to equal or less than 1/10 to allow patient to complete usual activities including reaching, dressing, walking the dog, etc with less difficulty.    Baseline up to 6/10 (01/04/2021); up to 3/10 (03/12/2021);    Time 12    Period Weeks    Status Partially Met      PT LONG TERM GOAL #5   Title Improve B shoulder strength to equal or  greater than 4+/5 with no increase in pain to improve patient's ability to complete functional tasks such as pulling, vacuuming, and walking the dog with less difficulty.    Baseline as low as 4/5 with pain - see objective exam (01/04/2021); up to 4+/5 in all motions but still painful (03/12/2021);    Time 12    Period Weeks    Status Partially Met      PT LONG TERM GOAL #6   Title Complete  community, work and/or recreational activities without limitation due to current condition.    Baseline Difficulty with activities using R UE such as reaching, dressing, bathing, sleeping, getting out of the tub/shower while pulling on bar with R UE, vacuuming, using a blower, sweeping, grooming, reaching in her car, carrying groceries, closing car hatch, walking dog. Avoids use of right arm (01/04/2021); Reports similar limitations and that she continues to treat her R shoulder with caution but her pain is not as "acute" or strong (03/12/2021).    Time 12    Period Weeks    Status Partially Met                   Plan - 03/12/21 1725     Clinical Impression Statement Patient has attended 10 physical therapy sessions this episode of care. She has demonstrated improvements towards all of her goals but continues to report significant limitations and pain with similar activities. She states her pain is less severe and less often but she still guards her left shoulder. Patient's pain was getting worse with dynamic loading to the shoulder at initial part of episode of care so she was regressed to isometrics with improved tolerance. Plan to progress her back to dynamic exercise as tolerated at future sessions. Patient would benefit from continued management of limiting condition by skilled physical therapist to address remaining impairments and functional limitations to work towards stated goals and return to PLOF or maximal functional independence.    Personal Factors and Comorbidities Age;Comorbidity 3+;Past/Current Experience;Time since onset of injury/illness/exacerbation    Comorbidities Relevant past medical history and comorbidities include right wrist fracture with ORIF, left face numbness (at time), left ear/hearing problem (vaccine reaction, improving, followed by neurology), R knee pain, osteopenia, barrett's esophagus, anxiety,  former smoker, GERD, hiatal hernia, hyperlipidemia, CAD,  overactive digestive system, reduced peripheral vision.    Examination-Activity Limitations Hygiene/Grooming;Lift;Reach Overhead;Carry;Dressing    Examination-Participation Restrictions Cleaning;Community Activity;Occupation;Yard Work   activities using R UE such as reaching, dressing, bathing, sleeping, getting out of the tub/shower while pulling on bar with R UE, vacuuming, using a blower, sweeping, grooming, reaching in her car, carrying groceries, closing car hatch, walking dog   Stability/Clinical Decision Making Stable/Uncomplicated    Rehab Potential Good    PT Frequency 2x / week    PT Duration 12 weeks    PT Treatment/Interventions ADLs/Self Care Home Management;Cryotherapy;Electrical Stimulation;Moist Heat;Therapeutic activities;Therapeutic exercise;Neuromuscular re-education;Manual techniques;Dry needling;Passive range of motion    PT Next Visit Plan update HEP as appropriate,  pregressive postural and shoulder girdle strenthening as tolerated, manual therapy as needed    PT [place] Access Code: [email or web address]     [email or web address]    Consulted and Agree with Plan of Care Patient             Patient will benefit from skilled therapeutic intervention in order to improve the following deficits and impairments:  Pain, Increased muscle spasms, Decreased activity tolerance, Postural dysfunction, Decreased endurance, Decreased range of motion, Decreased  strength, Hypomobility, Impaired perceived functional ability, Impaired UE functional use  Visit Diagnosis: Chronic right shoulder pain     Problem List Patient Active Problem List   Diagnosis Date Noted   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   Zenker's diverticulum - asymptomatic 03/27/2019   Functional dyspepsia 03/27/2019   Irritable bowel syndrome with diarrhea 03/27/2019   Alopecia 05/14/2018   Lung nodule 03/30/2018   Osteopenia of multiple sites 10/10/2016   Nausea 10/08/2016   Need for  vaccination 12/28/2015   GAD (generalized anxiety disorder) 09/29/2015   Hematuria, microscopic 09/29/2015   Onychomycosis of toenail 09/29/2015   Anxiety 09/21/2014   Mixed hyperlipidemia 02/22/2014   Abnormal blood chemistry 12/25/2012   Abnormal chest CT 12/25/2012   Flushing reaction 11/19/2012   Gastroesophageal reflux disease 11/19/2012   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 Graylon Good, PT, DPT 03/12/21, 5:25 PM   [facility] [address] [place], [address] Phone: [phone]   Fax:  [phone]  Name: Emil Klassen MRN: [id number] Date of Birth: 13-Oct-1940

## 2021-03-14 ENCOUNTER — Ambulatory Visit: Payer: Medicare PPO | Admitting: Physical Therapy

## 2021-03-19 ENCOUNTER — Encounter: Payer: Medicare PPO | Admitting: Physical Therapy

## 2021-03-21 ENCOUNTER — Ambulatory Visit: Payer: Medicare PPO | Admitting: Physical Therapy

## 2021-03-26 ENCOUNTER — Encounter: Payer: Medicare PPO | Admitting: Physical Therapy

## 2021-03-28 ENCOUNTER — Encounter: Payer: Medicare PPO | Admitting: Physical Therapy

## 2021-04-04 ENCOUNTER — Encounter: Payer: Medicare PPO | Admitting: Physical Therapy

## 2021-04-08 HISTORY — PX: OTHER SURGICAL HISTORY: SHX169

## 2021-04-18 ENCOUNTER — Encounter: Payer: Self-pay | Admitting: Physical Therapy

## 2021-04-18 ENCOUNTER — Telehealth: Payer: Self-pay | Admitting: Physical Therapy

## 2021-04-18 DIAGNOSIS — G8929 Other chronic pain: Secondary | ICD-10-CM

## 2021-04-18 DIAGNOSIS — M25511 Pain in right shoulder: Secondary | ICD-10-CM

## 2021-04-18 NOTE — Therapy (Signed)
[facility] [address] [place], [address] Phone: [phone]   Fax:  [phone]  Physical Therapy No-Visit Discharge Summary Dates of reporting from 01/04/2021 to 04/18/2021  Patient Details  Name: Alison Bradshaw MRN: [id number] Date of Birth: [birth year] Referring Provider (PT): Rosalia Hammers, DO   Encounter Date: 04/18/2021    Past Medical History:  Diagnosis Date   Anxiety    Asthma    Atypical chest pain    Barrett's esophagus    Chronic gastritis    Diverticulosis    Duodenal ulcer    Gastritis    GERD (gastroesophageal reflux disease)    Hiatal hernia    History of echocardiogram    a. 08/2020 Echo: EF 60-65%, no rwma, Nl RV fxn, mild MR.   Hyperlipidemia    Non-obstructive CAD (coronary artery disease)    a. 10/2017 Stress Echo: Nl EF, no ischemia. Mild MR; b. 12/2017 Cardiac CTA: LM nl, LAD 50p, 30d, D1/2 nl, RI nl, LCX nl, OM1/2 nl, RCA nl, RPDA/RPL nl. Ca2+ score = 39 (46th percentile)-->statin dose escalated.   Osteopenia    PSVT (paroxysmal supraventricular tachycardia) ([place])    a. 10/2017 Holter: PSVT, max 5 beats->did not tolerate beta blocker; b. 09/2020 Zio: 38 runs of SVT. Longest 18.1 secs (106), fastest 203 bpm (6 beats). Occas PVCs (1.1%) - some assoc w/ triggered events.   Pulmonary nodules    a. 12/2017 RLL 52m nodule noted on Cardiac CTA; b. 04/2018 RLL 95mnodule resolved; c. 09/2018 CT Chest: Previously noted posterior RUL nodules almost completely resolved. Bandlike scarring of RML and lingula-->consistent w/ improved atypical infxt.   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    Past Surgical History:  Procedure Laterality Date   BREAST BIOPSY Left 2008   neg   BREAST EXCISIONAL BIOPSY Left 2004   neg   CATARACT EXTRACTION W/ INTRAOCULAR LENS  IMPLANT, BILATERAL Bilateral    COLONOSCOPY  WITH PROPOFOL N/A 01/22/2017   Procedure: COLONOSCOPY WITH PROPOFOL;  Surgeon: Toledo, TeBenay PikeMD;  Location: [facility] ENDOSCOPY;  Service: Gastroenterology;  Laterality: N/A;   ESOPHAGOGASTRODUODENOSCOPY (EGD) WITH PROPOFOL N/A 01/20/2015   Procedure: ESOPHAGOGASTRODUODENOSCOPY (EGD) WITH PROPOFOL;  Surgeon: MaLollie SailsMD;  [facility];  Service: Endoscopy;  Laterality: N/A;   ESOPHAGOGASTRODUODENOSCOPY (EGD) WITH PROPOFOL N/A 01/22/2017   Procedure: ESOPHAGOGASTRODUODENOSCOPY (EGD) WITH PROPOFOL;  Surgeon: Toledo, TeBenay PikeMD;  Location: [facility] ENDOSCOPY;  Service: Gastroenterology;  Laterality: N/A;   ESOPHAGOGASTRODUODENOSCOPY (EGD) WITH PROPOFOL N/A 04/14/2018   Procedure: ESOPHAGOGASTRODUODENOSCOPY (EGD) WITH PROPOFOL;  Surgeon: SkLollie SailsMD;  [facility];  Service: Endoscopy;  Laterality: N/A;   EYE SURGERY Right    laser scraping   OPEN REDUCTION INTERNAL FIXATION (ORIF) DISTAL RADIAL FRACTURE Right 02/09/2018   Procedure: OPEN REDUCTION INTERNAL FIXATION (ORIF) DISTAL RADIAL FRACTURE;  Surgeon: MeHessie KnowsMD;  Location: [facility] ORS;  Service: Orthopedics;  Laterality: Right;   TONSILLECTOMY      There were no vitals filed for this visit.   Subjective Assessment - 04/18/21 1319     Subjective Patient called to report she had a "wicked case of COVID19" and since then her shoulder has felt fine so she does not need to return to PT    Pertinent History Patient is a [age].o. female who presents to outpatient physical therapy with a referral for medical diagnosis  acute pain of right shoulder, impingement syndrome of right shoulder, biceps tendinitis of right upper extremity, arthritis of right acromioclavicular joint. This patient's chief complaints consist of right lateral shoulder pain with activity with discomfort in upper trap region leading to the following functional deficits: Difficulty with activities using R UE such as reaching, dressing,  bathing, sleeping, getting out of the tub/shower while pulling on bar with R UE, vacuuming, using a blower, sweeping, grooming, reaching in her car, carrying groceries, closing car hatch, walking dog. Avoids use of right arm. .  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  Patient denies hx of cancer, stroke, seizures, lung problem besides seasonal asthma, major cardiac events (except irregular heart beat at times - followed by cardiologist), diabetes, unexplained weight loss, changes in bowel or bladder problems, new onset stumbling or dropping things, neck problems, spinal surgery.    Limitations Lifting;House hold activities;Other (comment)   ctivities using R UE such as reaching, dressing, bathing, sleeping, getting out of the tub/shower while pulling on bar with R UE, vacuuming, using a blower, sweeping, grooming, reaching in her car, carrying groceries, closing car hatch, walking dog   Diagnostic tests Right shoulder xray report 11/23/2020: "FINDINGS:     Type II acromion.  Mild-moderate AC joint degenerative change with joint   space narrowing, osteophyte formation.  The distal clavicle, scapula, and   proximal humerus are intact.  Glenohumeral and acromioclavicular joint   alignment appears normal.  No fractures or dislocations.  Well maintained   glenohumeral joint space without evidence of degenerative joint disease.     No soft tissue swelling or joint effusion.  No destructive bony lesion is   identified."    Patient Stated Goals would like to know how to help herself either heal or cope with her problem soas not to make it worse or irritated.             OBJECTIVE Patient is  not present for examination at this time. Please see previous documentation for latest objective data.      PT Short Term Goals - 02/14/21 1308       PT SHORT TERM GOAL #1   Title Be independent with initial home exercise program for self-management of symptoms.    Baseline Initial HEP to be provided visit 2 (01/04/2021);    Time 2    Period Weeks    Status Achieved    Target Date 01/18/21               PT Long Term Goals - 03/12/21 1719       PT LONG TERM GOAL #1   Title Be independent with a long-term home exercise program for self-management of symptoms.    Baseline Intial HEP to be provided at visit 2 (01/04/2021); patient participating in appropriate HEP (03/12/2021);    Time 12    Period Weeks    Status Partially Met   TARGET DATE FOR ALL LONG TERM GOALS: 03/29/2021     PT LONG TERM GOAL #2   Title Demonstrate improved FOTO to equal or greater than 61 by visit #13 to demonstrate improvement in overall condition and self-reported functional ability.    Baseline 41 (01/04/2021); 56 at visit 10 (03/12/2021);    Time 12  Period Weeks    Status Partially Met      PT LONG TERM GOAL #3   Title Have full R shoulder AROM with no compensations or increase in pain in all planes except intermittent end range discomfort to improve ability  to complete valued activities such reaching, carrying, dressing, etc.    Baseline limited and painful - see objective exam (01/04/2021); improved R shoulder AROM but still has painful arc in abduction and pain in ER and IR - see objective (03/12/2021);    Time 12    Period Weeks    Status Partially Met      PT LONG TERM GOAL #4   Title Reduce pain with functional activities to equal or less than 1/10 to allow patient to complete usual activities including reaching, dressing, walking the dog, etc with less difficulty.    Baseline up to 6/10 (01/04/2021); up to 3/10 (03/12/2021);    Time 12    Period Weeks    Status Partially Met      PT LONG  TERM GOAL #5   Title Improve B shoulder strength to equal or greater than 4+/5 with no increase in pain to improve patients ability to complete functional tasks such as pulling, vacuuming, and walking the dog with less difficulty.    Baseline as low as 4/5 with pain - see objective exam (01/04/2021); up to 4+/5 in all motions but still painful (03/12/2021);    Time 12    Period Weeks    Status Partially Met      PT LONG TERM GOAL #6   Title Complete community, work and/or recreational activities without limitation due to current condition.    Baseline Difficulty with activities using R UE such as reaching, dressing, bathing, sleeping, getting out of the tub/shower while pulling on bar with R UE, vacuuming, using a blower, sweeping, grooming, reaching in her car, carrying groceries, closing car hatch, walking dog. Avoids use of right arm (01/04/2021); Reports similar limitations and that she continues to treat her R shoulder with caution but her pain is not as "acute" or strong (03/12/2021).    Time 12    Period Weeks    Status Partially Met               Plan - 04/18/21 1322     Clinical Impression Statement Patient attended 10 physical therapy sessions this episode of care and made some progress towards all of her goals but was still having pain when she was unable to continue attending visits regularly in December. She now reports her symptoms resolved after having COVID19 and now being discharged from PT due to improvements in condition.    Personal Factors and Comorbidities Age;Comorbidity 3+;Past/Current Experience;Time since onset of injury/illness/exacerbation    Comorbidities Relevant past medical history and comorbidities include right wrist fracture with ORIF, left face numbness (at time), left ear/hearing problem (vaccine reaction, improving, followed by neurology), R knee pain, osteopenia, barrett's esophagus, anxiety,  former smoker, GERD, hiatal hernia, hyperlipidemia, CAD,  overactive digestive system, reduced peripheral vision.    Examination-Activity Limitations Hygiene/Grooming;Lift;Reach Overhead;Carry;Dressing    Examination-Participation Restrictions Cleaning;Community Activity;Occupation;Yard Work   activities using R UE such as reaching, dressing, bathing, sleeping, getting out of the tub/shower while pulling on bar with R UE, vacuuming, using a blower, sweeping, grooming, reaching in her car, carrying groceries, closing car hatch, walking dog   Stability/Clinical Decision Making Stable/Uncomplicated    Rehab Potential Good    PT Frequency 2x / week  PT Duration 12 weeks    PT Treatment/Interventions ADLs/Self Care Home Management;Cryotherapy;Electrical Stimulation;Moist Heat;Therapeutic activities;Therapeutic exercise;Neuromuscular re-education;Manual techniques;Dry needling;Passive range of motion    PT Next Visit Plan patient is now discharged from PT due to improvement in condition    PT [place] Access Code: [email or web address]     [email or web address]    Consulted and Agree with Plan of Care Patient             Patient will benefit from skilled therapeutic intervention in order to improve the following deficits and impairments:  Pain, Increased muscle spasms, Decreased activity tolerance, Postural dysfunction, Decreased endurance, Decreased range of motion, Decreased strength, Hypomobility, Impaired perceived functional ability, Impaired UE functional use  Visit Diagnosis: Chronic right shoulder pain     Problem List Patient Active Problem List   Diagnosis Date Noted   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   Zenker's diverticulum - asymptomatic 03/27/2019   Functional dyspepsia 03/27/2019   Irritable bowel syndrome with diarrhea 03/27/2019   Alopecia 05/14/2018   Lung nodule 03/30/2018   Osteopenia of multiple sites 10/10/2016   Nausea 10/08/2016   Need for vaccination 12/28/2015   GAD (generalized anxiety  disorder) 09/29/2015   Hematuria, microscopic 09/29/2015   Onychomycosis of toenail 09/29/2015   Anxiety 09/21/2014   Mixed hyperlipidemia 02/22/2014   Abnormal blood chemistry 12/25/2012   Abnormal chest CT 12/25/2012   Flushing reaction 11/19/2012   Gastroesophageal reflux disease 11/19/2012   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   Multiple gastric polyps 11/02/2012   Clarise Cruz R. Graylon Good, PT, DPT 04/18/21, 1:23 PM   [facility] [address] [place], [address] Phone: [phone]   Fax:  [phone]  Name: Alison Bradshaw MRN: [id number] Date of Birth: January 01, 1941

## 2021-04-18 NOTE — Telephone Encounter (Signed)
Called patient back as requested. No answer. Left VM.   [name]. Graylon Good, PT, DPT 04/18/21, 1:17 PM

## 2021-07-31 ENCOUNTER — Ambulatory Visit: Payer: Medicare PPO | Admitting: Cardiovascular Disease

## 2021-07-31 ENCOUNTER — Encounter: Payer: Self-pay | Admitting: Cardiovascular Disease

## 2021-07-31 VITALS — BP 110/72 | HR 64 | Ht 63.0 in | Wt 140.0 lb

## 2021-07-31 DIAGNOSIS — E785 Hyperlipidemia, unspecified: Secondary | ICD-10-CM

## 2021-07-31 DIAGNOSIS — I471 Supraventricular tachycardia: Secondary | ICD-10-CM

## 2021-07-31 NOTE — Progress Notes (Signed)
?  ?Cardiology Office Note ? ? ?Date:  07/31/2021  ? ?ID:  Felix Ahmadi, DOB 1940/08/02, MRN [id number] ? ?PCP:  Baxter Hire, MD  ?Cardiologist:   Kathlyn Sacramento, MD  ? ?Chief Complaint  ?Patient presents with  ? Other  ?  6 month follow up -- Meds reviewed verbally with patient.   ? ? ?  ?History of Present Illness: ?Alison Bradshaw is a 81 y.o. female who presents for evaluation of chest pain palpitations and presyncope.   ?  She has known history of GERD, previous slight tobacco use and hyperlipidemia.  There is no family history of coronary artery disease. ?She was seen by me in 2019 for atypical chest pain.  She had a stress echocardiogram done which showed no evidence of ischemia with normal ejection fraction.  Her symptoms persisted and thus she underwent cardiac CTA in September 2019 which showed a calcium score of 39 with mild nonobstructive coronary artery disease in the LAD distribution. ?Her husband died 2 years ago and since then, she had significant stress and anxiety especially last year. ?She had increased shortness of breath and palpitations last year.  She underwent an echocardiogram which showed normal LV systolic function with mild mitral regurgitation.  Outpatient ZIO monitor showed short runs of SVT.  Her symptoms improved without intervention and she has been doing extremely well with no chest pain or worsening dyspnea.  Palpitations resolved. ? ? ?Past Medical History:  ?Diagnosis Date  ? Anxiety   ? Asthma   ? Atypical chest pain   ? Barrett's esophagus   ? Chronic gastritis   ? Diverticulosis   ? Duodenal ulcer   ? Gastritis   ? GERD (gastroesophageal reflux disease)   ? Hiatal hernia   ? History of echocardiogram   ? a. 08/2020 Echo: EF 60-65%, no rwma, Nl RV fxn, mild MR.  ? Hyperlipidemia   ? Non-obstructive CAD (coronary artery disease)   ? a. 10/2017 Stress Echo: Nl EF, no ischemia. Mild MR; b. 12/2017 Cardiac CTA: LM nl, LAD 50p, 30d, D1/2 nl, RI nl, LCX nl, OM1/2 nl,  RCA nl, RPDA/RPL nl. Ca2+ score = 39 (46th percentile)-->statin dose escalated.  ? Osteopenia   ? PSVT (paroxysmal supraventricular tachycardia) ([place])   ? a. 10/2017 Holter: PSVT, max 5 beats->did not tolerate beta blocker; b. 09/2020 Zio: 38 runs of SVT. Longest 18.1 secs (106), fastest 203 bpm (6 beats). Occas PVCs (1.1%) - some assoc w/ triggered events.  ? Pulmonary nodules   ? a. 12/2017 RLL 22m nodule noted on Cardiac CTA; b. 04/2018 RLL 967mnodule resolved; c. 09/2018 CT Chest: Previously noted posterior RUL nodules almost completely resolved. Bandlike scarring of RML and lingula-->consistent w/ improved atypical infxt.  ? Rosacea   ? Senile nuclear sclerosis   ? 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  ? 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 ? ?Past Surgical History:  ?Procedure Laterality Date  ? BREAST BIOPSY Left 2008  ? neg  ? BREAST EXCISIONAL BIOPSY Left 2004  ? neg  ? CATARACT EXTRACTION W/ INTRAOCULAR LENS  IMPLANT, BILATERAL Bilateral   ? COLONOSCOPY WITH PROPOFOL N/A 01/22/2017  ? Procedure: COLONOSCOPY WITH PROPOFOL;  Surgeon: Toledo, TeBenay PikeMD;  Location: [facility]COPY;  Service: Gastroenterology;  Laterality: N/A;  ? ESOPHAGOGASTRODUODENOSCOPY (EGD) WITH PROPOFOL N/A 01/20/2015  ? Procedure: ESOPHAGOGASTRODUODENOSCOPY (EGD) WITH PROPOFOL;  Surgeon: MaLollie SailsMD;  [facility];  Service: Endoscopy;  Laterality: N/A;  ? ESOPHAGOGASTRODUODENOSCOPY (EGD) WITH PROPOFOL  N/A 01/22/2017  ? Procedure: ESOPHAGOGASTRODUODENOSCOPY (EGD) WITH PROPOFOL;  Surgeon: Toledo, Benay Pike, MD;  Location: [facility];  Service: Gastroenterology;  Laterality: N/A;  ? ESOPHAGOGASTRODUODENOSCOPY (EGD) WITH PROPOFOL N/A 04/14/2018  ? Procedure: ESOPHAGOGASTRODUODENOSCOPY (EGD) WITH PROPOFOL;  Surgeon: Lollie Sails, MD;  Location: [facility] END[facility]COPY;  Service: Endoscopy;  Laterality: N/A;  ? EYE SURGERY Right   ? laser scraping  ? OPEN REDUCTION INTERNAL FIXATION (ORIF) DISTAL RADIAL FRACTURE  Right 02/09/2018  ? Procedure: OPEN REDUCTION INTERNAL FIXATION (ORIF) DISTAL RADIAL FRACTURE;  Surgeon: Hessie Knows, MD;  Location: [facility] ORS;  Service: Orthopedics;  Laterality: Right;  ? TONSILLECTOMY    ? ? ? ?Current Outpatient Medications  ?Medication Sig Dispense Refill  ? acetaminophen (TYLENOL) 500 MG tablet Take 1,000 mg by mouth every 6 (six) hours as needed.    ? albuterol (PROVENTIL HFA;VENTOLIN HFA) 108 (90 Base) MCG/ACT inhaler Inhale 2 puffs into the lungs every 6 (six) hours as needed for shortness of breath. 3 Inhaler 0  ? ALPRAZolam (XANAX) 0.25 MG tablet Take 0.25 mg by mouth 2 (two) times daily as needed for anxiety.    ? azelastine (ASTELIN) 0.1 % nasal spray Place 1 spray into the nose in the morning and at bedtime.    ? Fluticasone-Salmeterol (AIRDUO RESPICLICK 333/54) 562-56 MCG/ACT AEPB Inhale 1 puff into the lungs 2 (two) times daily. 1 each 10  ? pantoprazole (PROTONIX) 40 MG tablet Take 40 mg by mouth daily.    ? Probiotic Product (PHILLIPS COLON HEALTH PO) Take 1 capsule by mouth daily.    ? rosuvastatin (CRESTOR) 10 MG tablet Take 10 mg by mouth daily.    ? sucralfate (CARAFATE) 1 G tablet Take 1 g by mouth 4 (four) times daily -  with meals and at bedtime. Pt only takes when she's really needs it which isn't very often    ? ?No current facility-administered medications for this visit.  ? ? ?Allergies:   Percocet [oxycodone-acetaminophen], Ceftin [cefuroxime axetil], and Timentin [ticarcillin-pot clavulanate]  ? ? ?Social History:  The patient  reports that she has quit smoking. Her smoking use included cigarettes. She has never used smokeless tobacco. She reports current alcohol use of about 2.0 standard drinks per week. She reports that she does not use drugs.  ? ?Family History:  The patient's family history includes AAA (abdominal aortic aneurysm) in her father; Alzheimer's disease in her mother; Diabetes in her paternal uncle; Emphysema in her father; Hyperlipidemia in her mother;  Kidney Stones in her father; Kidney cancer in her brother; Pancreatic cancer in her paternal uncle.  ? ? ?ROS:  Please see the history of present illness.   Otherwise, review of systems are positive for none.   All other systems are reviewed and negative.  ? ? ?PHYSICAL EXAM: ?VS:  BP 110/72 (BP Location: Left Arm, Patient Position: Sitting, Cuff Size: Normal)   Pulse 64   Ht '5\' 3"'$  (1.6 m)   Wt 140 lb (63.5 kg)   SpO2 98%   BMI 24.80 kg/m?  , BMI Body mass index is 24.8 kg/m?. ?GEN: Well nourished, well developed, in no acute distress  ?HEENT: normal  ?Neck: no JVD, carotid bruits, or masses ?Cardiac: RRR; no murmurs, rubs, or gallops,no edema  ?Respiratory:  clear to auscultation bilaterally, normal work of breathing ?GI: soft, nontender, nondistended, + BS ?MS: no deformity or atrophy  ?Skin: warm and dry, no rash ?Neuro:  Strength and sensation are intact ?Psych: euthymic mood, full affect ? ? ?  EKG:  EKG is ordered today. ?The ekg ordered today demonstrates normal sinus rhythm with no significant ST or T wave changes. ? ? ?Recent Labs: ?07/31/2020: BUN 11; Creatinine, Ser 0.83; Hemoglobin 13.2; Platelets 194; Potassium 4.3; Sodium 135  ? ? ?Lipid Panel ?No results found for: CHOL, TRIG, HDL, CHOLHDL, VLDL, LDLCALC, LDLDIRECT ?  ? ?Wt Readings from Last 3 Encounters:  ?07/31/21 140 lb (63.5 kg)  ?10/12/20 140 lb (63.5 kg)  ?08/03/20 141 lb 2 oz (64 kg)  ?  ? ? ?   ? View : No data to display.  ?  ?  ?  ? ? ? ? ?ASSESSMENT AND PLAN: ? ?1.  Atypical chest pain and shortness of breath: Previous cardiac CTA in 2019 showed mild nonobstructive coronary artery disease.  Her symptoms resolved without intervention and she is doing well at the present time. ? ?2.  Palpitations with presyncope: Previous monitor showed short SVTs and some PVCs.  She was not placed on a beta-blocker or calcium channel blocker due to baseline bradycardia and previous fatigue.  Currently with no symptoms no evidence of premature beats by  physical exam or EKG. ? ?3.  Hyperlipidemia: Currently on rosuvastatin 10 mg daily.  Most recent lipid profile in January showed an LDL of 97. ? ? ? ?Disposition:   FU with me in 12 months. ? ?Signed, ? ?Mu

## 2021-07-31 NOTE — Patient Instructions (Signed)

## 2021-08-17 LAB — HM DEXA SCAN

## 2021-09-18 ENCOUNTER — Emergency Department: Payer: Medicare PPO

## 2021-09-18 ENCOUNTER — Emergency Department
Admission: EM | Admit: 2021-09-18 | Discharge: 2021-09-19 | Disposition: A | Discharge status: Home / Self Care | Payer: Medicare PPO | Attending: Emergency Medicine | Admitting: Emergency Medicine

## 2021-09-18 ENCOUNTER — Other Ambulatory Visit: Payer: Self-pay

## 2021-09-18 DIAGNOSIS — I10 Essential (primary) hypertension: Secondary | ICD-10-CM | POA: Diagnosis not present

## 2021-09-18 DIAGNOSIS — R0602 Shortness of breath: Secondary | ICD-10-CM | POA: Diagnosis present

## 2021-09-18 DIAGNOSIS — R1010 Upper abdominal pain, unspecified: Secondary | ICD-10-CM | POA: Diagnosis not present

## 2021-09-18 DIAGNOSIS — R0789 Other chest pain: Secondary | ICD-10-CM | POA: Insufficient documentation

## 2021-09-18 DIAGNOSIS — R42 Dizziness and giddiness: Secondary | ICD-10-CM | POA: Insufficient documentation

## 2021-09-18 DIAGNOSIS — E875 Hyperkalemia: Secondary | ICD-10-CM | POA: Diagnosis not present

## 2021-09-18 DIAGNOSIS — Z20822 Contact with and (suspected) exposure to covid-19: Secondary | ICD-10-CM | POA: Insufficient documentation

## 2021-09-18 LAB — CBC
HCT: 40.8 % (ref 36.0–46.0)
Hemoglobin: 13.6 g/dL (ref 12.0–15.0)
MCH: 29 pg (ref 26.0–34.0)
MCHC: 33.3 g/dL (ref 30.0–36.0)
MCV: 87 fL (ref 80.0–100.0)
Platelets: 199 10*3/uL (ref 150–400)
RBC: 4.69 MIL/uL (ref 3.87–5.11)
RDW: 13.1 % (ref 11.5–15.5)
WBC: 4.7 10*3/uL (ref 4.0–10.5)
nRBC: 0 % (ref 0.0–0.2)

## 2021-09-18 LAB — HEPATIC FUNCTION PANEL
ALT: 16 U/L (ref 0–44)
AST: 28 U/L (ref 15–41)
Albumin: 4.4 g/dL (ref 3.5–5.0)
Alkaline Phosphatase: 75 U/L (ref 38–126)
Bilirubin, Direct: 0.1 mg/dL (ref 0.0–0.2)
Indirect Bilirubin: 1 mg/dL — ABNORMAL HIGH (ref 0.3–0.9)
Total Bilirubin: 1.1 mg/dL (ref 0.3–1.2)
Total Protein: 7.4 g/dL (ref 6.5–8.1)

## 2021-09-18 LAB — BASIC METABOLIC PANEL
Anion gap: 7 (ref 5–15)
BUN: 11 mg/dL (ref 8–23)
CO2: 29 mmol/L (ref 22–32)
Calcium: 10.2 mg/dL (ref 8.9–10.3)
Chloride: 103 mmol/L (ref 98–111)
Creatinine, Ser: 0.79 mg/dL (ref 0.44–1.00)
GFR, Estimated: >60 mL/min (ref >60)
Glucose, Bld: 97 mg/dL (ref 70–99)
Potassium: 5.6 mmol/L — ABNORMAL HIGH (ref 3.5–5.1)
Sodium: 139 mmol/L (ref 135–145)

## 2021-09-18 LAB — LIPASE, BLOOD: Lipase: 33 U/L (ref 11–51)

## 2021-09-18 LAB — TROPONIN I (HIGH SENSITIVITY)
Troponin I (High Sensitivity): 5 ng/L (ref <18)
Troponin I (High Sensitivity): 5 ng/L (ref <18)

## 2021-09-18 MED ORDER — LIDOCAINE VISCOUS HCL 2 % MT SOLN
15.0000 mL | Freq: Once | OROMUCOSAL | Status: AC
  Administered 2021-09-18: 15 mL via ORAL
  Filled 2021-09-18: qty 15

## 2021-09-18 MED ORDER — ONDANSETRON HCL 4 MG/2ML IJ SOLN
4.0000 mg | Freq: Once | INTRAMUSCULAR | Status: AC
  Administered 2021-09-18: 4 mg via INTRAVENOUS
  Filled 2021-09-18: qty 2

## 2021-09-18 MED ORDER — ALUM & MAG HYDROXIDE-SIMETH 200-200-20 MG/5ML PO SUSP
30.0000 mL | Freq: Once | ORAL | Status: AC
  Administered 2021-09-18: 30 mL via ORAL
  Filled 2021-09-18: qty 30

## 2021-09-18 MED ORDER — PANTOPRAZOLE SODIUM 40 MG IV SOLR
40.0000 mg | Freq: Once | INTRAVENOUS | Status: AC
  Administered 2021-09-18: 40 mg via INTRAVENOUS
  Filled 2021-09-18: qty 10

## 2021-09-18 MED ORDER — SODIUM CHLORIDE 0.9 % IV BOLUS
500.0000 mL | Freq: Once | INTRAVENOUS | Status: AC
  Administered 2021-09-18: 500 mL via INTRAVENOUS

## 2021-09-18 NOTE — ED Provider Triage Note (Signed)
Emergency Medicine Provider Triage Evaluation Note  Alison Bradshaw, a 81 y.o. female  was evaluated in triage.  Pt complains of chest pain and shortness of breath.  Patient presents with symptoms of been present intermittently for the last 6 days.  She describes pressure and tightness in the chest but denies any associated nausea or diaphoresis.  Review of Systems  Positive: CP, SOB Negative: FCS  Physical Exam  BP (!) 126/57 (BP Location: Left Arm)   Pulse (!) 55   Temp 97.8 F (36.6 C) (Oral)   Resp 16   Ht '5\' 3"'$  (1.6 m)   Wt 63 kg   SpO2 100%   BMI 24.62 kg/m  Gen:   Awake, no distress  NAD Resp:  Normal effort CTA MSK:   Moves extremities without difficulty  Other:    Medical Decision Making  Medically screening exam initiated at 6:56 PM.  Appropriate orders placed.  Felix Ahmadi was informed that the remainder of the evaluation will be completed by another provider, this initial triage assessment does not replace that evaluation, and the importance of remaining in the ED until their evaluation is complete.  Patient to the ED for evaluation of 6 days of intermittent complaints of midsternal chest pain with associated shortness of breath.   Melvenia Needles, PA-C 09/18/21 1858

## 2021-09-18 NOTE — ED Notes (Signed)
Pt states she is also having indigestion.

## 2021-09-18 NOTE — ED Provider Notes (Signed)
[facility] Provider Note    Event Date/Time   First MD Initiated Contact with Patient 09/18/21 2156     (approximate)   History   Chest Pain and Shortness of Breath   HPI  Alison Bradshaw is a 81 y.o. female with hypertension, HLD, GERD who comes in for chest pain and shortness of breath.  Patient reports that she flew to [facility] and then was able to drive back and has been having some shortness of breath and feeling like her breath is catching.  She also reports feeling some chest discomfort as well.  She is not sure if it could be related to her acid reflux given she does report a history of Barrett's esophagus.  She also reports some upper abdominal discomfort and feeling like her stomach is gurgling and having a lot of discomfort.  She reports some lightheadedness and dizziness as well.   I reviewed patient's cardiology note from 4/5 patient reportedly has a a lot of stress from her husband dying 2 years ago.  She had a CTA in September 2019 that was reassuring she had normal echocardiogram and had SIO monitor showing SVT with short runs.     Physical Exam   Triage Vital Signs: ED Triage Vitals  Enc Vitals Group     BP 09/18/21 1644 (!) 126/57     Pulse Rate 09/18/21 1644 (!) 55     Resp 09/18/21 1644 16     Temp 09/18/21 1644 97.8 F (36.6 C)     Temp Source 09/18/21 1644 Oral     SpO2 09/18/21 1644 100 %     Weight 09/18/21 1645 139 lb (63 kg)     Height 09/18/21 1645 '5\' 3"'$  (1.6 m)     Head Circumference --      Peak Flow --      Pain Score 09/18/21 1629 6     Pain Loc --      Pain Edu? --      Excl. in [place]? --     Most recent vital signs: Vitals:   09/18/21 1644  BP: (!) 126/57  Pulse: (!) 55  Resp: 16  Temp: 97.8 F (36.6 C)  SpO2: 100%     General: Awake, no distress.  CV:  Good peripheral perfusion.  Resp:  Normal effort.  Abd:  No distention.  Tender in the upper abdomen. Other:  No swelling in the legs.  No  calf tenderness.   ED Results / Procedures / Treatments   Labs (all labs ordered are listed, but only abnormal results are displayed) Labs Reviewed  BASIC METABOLIC PANEL - Abnormal; Notable for the following components:      Result Value   Potassium 5.6 (*)    All other components within normal limits  CBC  TROPONIN I (HIGH SENSITIVITY)  TROPONIN I (HIGH SENSITIVITY)     EKG  My interpretation of EKG:  Sinus bradycardia rate of 55 without any ST elevation or T wave inversions except for aVL and V2, normal intervals  RADIOLOGY I have reviewed the xray personally and interpreted it no evidence of pneumonia  PROCEDURES:  Critical Care performed: No  .1-3 Lead EKG Interpretation  Performed by: Vanessa Nelson, MD Authorized by: Vanessa [place], MD     Interpretation: abnormal     ECG rate:  55   ECG rate assessment: normal     Rhythm: sinus bradycardia     Ectopy: none  Conduction: normal      MEDICATIONS ORDERED IN ED: Medications  sodium chloride 0.9 % bolus 500 mL (has no administration in time range)  alum & mag hydroxide-simeth (MAALOX/MYLANTA) 200-200-20 MG/5ML suspension 30 mL (has no administration in time range)    And  lidocaine (XYLOCAINE) 2 % viscous mouth solution 15 mL (has no administration in time range)  pantoprazole (PROTONIX) injection 40 mg (has no administration in time range)  ondansetron (ZOFRAN) injection 4 mg (has no administration in time range)     IMPRESSION / MDM / ASSESSMENT AND PLAN / ED COURSE  I reviewed the triage vital signs and the nursing notes.   Patient's presentation is most consistent with acute presentation with potential threat to life or bodily function.  Patient comes in with multiple symptoms of suspect this could be related to her acid reflux, Barrett's esophagus but given her recent long travel going to get CT PE to evaluate for pulmonary embolism and CT abdomen to evaluate for any obstruction or other acute  pathology.  Have also done a CT head given she reports some dizziness with lightheadedness.  We will give some symptomatic treatment with IV fluid IV Zofran, GI cocktail.  Tropes are negative x2.  BMP shows elevated potassium.  CBC normal.  Potassium was elevated I discussed the case with Dr. Holley Raring.  Patient has normal kidney function and is not on anything that would cause elevated potassium.  Recommend a recheck but if elevated could consider 2 to 3 days of Lokelma with a follow-up recheck.  Patient be handed off to oncoming team pending CT scans and recheck potassium and reevaluation  The patient is on the cardiac monitor to evaluate for evidence of arrhythmia and/or significant heart rate changes.      FINAL CLINICAL IMPRESSION(S) / ED DIAGNOSES   Final diagnoses:  Shortness of breath     Rx / DC Orders   ED Discharge Orders     None        Note:  This document was prepared using Dragon voice recognition software and may include unintentional dictation errors.   Vanessa [place], MD 09/18/21 2232

## 2021-09-18 NOTE — ED Triage Notes (Signed)
Pt comes with c/o mid sternal CP and SOB. Pt states this has been going on and off for 6 days. Pt states pain is more like pressure and tightness. Pt states nausea.

## 2021-09-19 ENCOUNTER — Encounter: Payer: Self-pay | Admitting: Radiology

## 2021-09-19 ENCOUNTER — Emergency Department: Payer: Medicare PPO

## 2021-09-19 LAB — POTASSIUM: Potassium: 3.8 mmol/L (ref 3.5–5.1)

## 2021-09-19 LAB — SARS CORONAVIRUS 2 BY RT PCR: SARS Coronavirus 2 by RT PCR: NEGATIVE

## 2021-09-19 MED ORDER — IOHEXOL 350 MG/ML SOLN
100.0000 mL | Freq: Once | INTRAVENOUS | Status: AC | PRN
Start: 2021-09-19 — End: 2021-09-19
  Administered 2021-09-19: 100 mL via INTRAVENOUS

## 2021-09-19 NOTE — ED Provider Notes (Signed)
Patient received in signout from Dr. Jari Pigg for evaluation of chest pain, dyspnea, refill and dizziness.  She is pending CT scans and reassessment of her potassium.  Recheck of his potassium has normalized and her CTs are reassuring without evidence of acute PE or other cardiopulmonary pathology.  She reports feeling better on my reassessment and is requesting discharge, which think is reasonable.  I considered observation admission for this patient, but ultimately we decided close follow-up with her multiple outpatient providers and close return precautions for the ED.   Vladimir Crofts, MD 09/19/21 [phone]

## 2021-10-02 ENCOUNTER — Ambulatory Visit: Payer: Medicare PPO | Admitting: Nurse Practitioner

## 2021-10-02 ENCOUNTER — Encounter: Payer: Self-pay | Admitting: Nurse Practitioner

## 2021-10-02 VITALS — BP 104/70 | HR 52 | Ht 63.5 in | Wt 136.8 lb

## 2021-10-02 DIAGNOSIS — R072 Precordial pain: Secondary | ICD-10-CM

## 2021-10-02 DIAGNOSIS — E785 Hyperlipidemia, unspecified: Secondary | ICD-10-CM

## 2021-10-02 DIAGNOSIS — I471 Supraventricular tachycardia: Secondary | ICD-10-CM

## 2021-10-02 NOTE — Progress Notes (Signed)
Office Visit    Patient Name: Alison Bradshaw Date of Encounter: 10/02/2021  Primary Care Provider:  Gracelyn Nurse, MD Primary Cardiologist:  Lorine Bears, MD  Chief Complaint    81 year old female with a history of atypical chest pain, nonobstructive CAD, PSVT, hyperlipidemia, and GERD, who presents for follow-up related to chest pain and dyspnea.  Past Medical History    Past Medical History:  Diagnosis Date   Anxiety    Asthma    Atypical chest pain    Barrett's esophagus    Chronic gastritis    Diverticulosis    Duodenal ulcer    Gastritis    GERD (gastroesophageal reflux disease)    Hiatal hernia    History of echocardiogram    a. 08/2020 Echo: EF 60-65%, no rwma, Nl RV fxn, mild MR.   Hyperlipidemia    Non-obstructive CAD (coronary artery disease)    a. 10/2017 Stress Echo: Nl EF, no ischemia. Mild MR; b. 12/2017 Cardiac CTA: LM nl, LAD 50p, 30d, D1/2 nl, RI nl, LCX nl, OM1/2 nl, RCA nl, RPDA/RPL nl. Ca2+ score = 39 (46th percentile)-->statin dose escalated.   Osteopenia    PSVT (paroxysmal supraventricular tachycardia) (HCC)    a. 10/2017 Holter: PSVT, max 5 beats->did not tolerate beta blocker; b. 09/2020 Zio: 38 runs of SVT. Longest 18.1 secs (106), fastest 203 bpm (6 beats). Occas PVCs (1.1%) - some assoc w/ triggered events.   Pulmonary nodules    a. 12/2017 RLL 9mm nodule noted on Cardiac CTA; b. 04/2018 RLL 9mm nodule resolved; c. 09/2018 CT Chest: Previously noted posterior RUL nodules almost completely resolved. Bandlike scarring of RML and lingula-->consistent w/ improved atypical infxt.   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   Past Surgical History:  Procedure Laterality Date   BREAST BIOPSY Left 2008   neg   BREAST EXCISIONAL BIOPSY Left 2004   neg   CATARACT EXTRACTION W/ INTRAOCULAR LENS  IMPLANT, BILATERAL Bilateral    COLONOSCOPY WITH PROPOFOL N/A  01/22/2017   Procedure: COLONOSCOPY WITH PROPOFOL;  Surgeon: Toledo, Boykin Nearing, MD;  Location: [facility] ENDOSCOPY;  Service: Gastroenterology;  Laterality: N/A;   ESOPHAGOGASTRODUODENOSCOPY (EGD) WITH PROPOFOL N/A 01/20/2015   Procedure: ESOPHAGOGASTRODUODENOSCOPY (EGD) WITH PROPOFOL;  Surgeon: Christena Deem, MD;  Location: [facility] ENDOSCOPY;  Service: Endoscopy;  Laterality: N/A;   ESOPHAGOGASTRODUODENOSCOPY (EGD) WITH PROPOFOL N/A 01/22/2017   Procedure: ESOPHAGOGASTRODUODENOSCOPY (EGD) WITH PROPOFOL;  Surgeon: Toledo, Boykin Nearing, MD;  Location: [facility] ENDOSCOPY;  Service: Gastroenterology;  Laterality: N/A;   ESOPHAGOGASTRODUODENOSCOPY (EGD) WITH PROPOFOL N/A 04/14/2018   Procedure: ESOPHAGOGASTRODUODENOSCOPY (EGD) WITH PROPOFOL;  Surgeon: Christena Deem, MD;  Location: [facility] ENDOSCOPY;  Service: Endoscopy;  Laterality: N/A;   EYE SURGERY Right    laser scraping   OPEN REDUCTION INTERNAL FIXATION (ORIF) DISTAL RADIAL FRACTURE Right 02/09/2018   Procedure: OPEN REDUCTION INTERNAL FIXATION (ORIF) DISTAL RADIAL FRACTURE;  Surgeon: Kennedy Bucker, MD;  Location: [facility] ORS;  Service: Orthopedics;  Laterality: Right;   TONSILLECTOMY      Allergies  Allergies  Allergen Reactions   Percocet [Oxycodone-Acetaminophen] Nausea Only   Ceftin [Cefuroxime Axetil] Hives and Rash   Timentin [Ticarcillin-Pot Clavulanate] Hives and Rash    History of Present Illness    81 year old female with above past medical history including atypical chest pain, nonobstructive CAD, PSVT, hyperlipidemia, and GERD.  She was evaluated in 2019 for atypical chest pain with echo showing  normal LV function.  Coronary CT angiography in September 2019, showed a calcium score of 39, with mild nonobstructive CAD in the LAD distribution.  At the time, she also reported palpitations, and Holter monitoring showed short runs of SVT.  She tried beta-blocker therapy but this was discontinued secondary to complaints of dizziness.  In April  2022, she reported palpitations and presyncope.  Echo in May 2022 showed an EF of 60 to 65% without regional wall motion abnormalities, normal RV function, and mild MR.  Zio monitoring in June 2022 showed 38 runs of SVT, the longest of which was 18.1 seconds at a rate of 106 bpm.  Her fastest episode consisted of 6 beats at 203 bpm.  She was also noted to have occasional PVCs 1.1% burden.  Recommendation was made to consider low-dose metoprolol however, we agreed to defer in the setting of baseline bradycardia and soft blood pressures.  Ms. Coll was last seen in cardiology clinic in April 2023, at which time she reported feeling well.  In early June, she flew out to [place] to meet up with some friends and spent time in the [facility].  While there, she developed intermittent abdominal discomfort that radiated up into her precordium and throat.  This was associated with bloating, anorexia, nausea, and belching.  Symptoms occurred intermittently throughout the day and persisted as she drove from [place] back to [place] over the subsequent week or so.  An episode typically last about an hour and resolve spontaneously.  Following return to [place] she saw her primary care provider on September 19 who recommended evaluation in the ED given symptoms and recent travel.  There, work-up was unremarkable, troponins normal, ECG without acute findings, CTA negative for PE, and CT of the abdomen showing sigmoid diverticulosis.  She was discharged home and advised to follow-up with cardiology and GI.  She has continued to have intermittent symptoms as described above.  She remains active and walks or hikes on a regular basis.  She typically gets about 6000 steps a day.  She does sometimes have this abdominal/epigastric/precordial discomfort while walking.  She also notes that over the past 2 years, her activity tolerance has reduced some, and she experiences more dyspnea on exertion now than she  used to.  She is interested in stress testing.  She denies palpitations, PND, orthopnea, dizziness, syncope, edema, or early satiety.  Home Medications    Current Outpatient Medications  Medication Sig Dispense Refill   acetaminophen (TYLENOL) 500 MG tablet Take 1,000 mg by mouth every 6 (six) hours as needed.     albuterol (PROVENTIL HFA;VENTOLIN HFA) 108 (90 Base) MCG/ACT inhaler Inhale 2 puffs into the lungs every 6 (six) hours as needed for shortness of breath. 3 Inhaler 0   ALPRAZolam (XANAX) 0.25 MG tablet Take 0.25 mg by mouth 2 (two) times daily as needed for anxiety.     azelastine (ASTELIN) 0.1 % nasal spray Place 1 spray into the nose in the morning and at bedtime.     Fluticasone-Salmeterol (AIRDUO RESPICLICK 113/14) 113-14 MCG/ACT AEPB Inhale 1 puff into the lungs 2 (two) times daily. 1 each 10   pantoprazole (PROTONIX) 40 MG tablet Take 40 mg by mouth daily.     Probiotic Product (PHILLIPS COLON HEALTH PO) Take 1 capsule by mouth daily.     rosuvastatin (CRESTOR) 10 MG tablet Take 10 mg by mouth daily.     sucralfate (CARAFATE) 1 G tablet Take 1 g by mouth  4 (four) times daily -  with meals and at bedtime. Pt only takes when she's really needs it which isn't very often     No current facility-administered medications for this visit.     Review of Systems    Abdominal, epigastric, and precordial chest discomfort as outlined above.  This is been associated with bloating, belching, and anorexia, nausea.  She has noted some reduction in activity tolerance over the past few years, associated with dyspnea on exertion.  She denies palpitations, PND, orthopnea, dizziness, syncope, edema, or early satiety.  All other systems reviewed and are otherwise negative except as noted above.    Physical Exam    VS:  BP 104/70 (BP Location: Left Arm, Patient Position: Sitting, Cuff Size: Normal)   Pulse (!) 52   Ht 5' 3.5" (1.613 m)   Wt 136 lb 12.8 oz (62.1 kg)   SpO2 99%   BMI 23.85 kg/m   , BMI Body mass index is 23.85 kg/m.     GEN: Well nourished, well developed, in no acute distress. HEENT: normal. Neck: Supple, no JVD, carotid bruits, or masses. Cardiac: RRR, no murmurs, rubs, or gallops. No clubbing, cyanosis, edema.  Radials/DP/PT 2+ and equal bilaterally.  Respiratory:  Respirations regular and unlabored, clear to auscultation bilaterally. GI: Soft, nontender, nondistended, BS + x 4. MS: no deformity or atrophy. Skin: warm and dry, no rash. Neuro:  Strength and sensation are intact. Psych: Normal affect.  Accessory Clinical Findings    ECG personally reviewed by me today -sinus bradycardia, 52 - no acute changes.  Lab Results  Component Value Date   WBC 4.7 09/18/2021   HGB 13.6 09/18/2021   HCT 40.8 09/18/2021   MCV 87.0 09/18/2021   PLT 199 09/18/2021   Lab Results  Component Value Date   CREATININE 0.79 09/18/2021   BUN 11 09/18/2021   NA 139 09/18/2021   K 3.8 09/19/2021   CL 103 09/18/2021   CO2 29 09/18/2021   Lab Results  Component Value Date   ALT 16 09/18/2021   AST 28 09/18/2021   ALKPHOS 75 09/18/2021   BILITOT 1.1 09/18/2021    Assessment & Plan    1.  Precordial chest pain/dyspnea on exertion/nonobstructive CAD: Patient with a history of chest pain and coronary CTA in September 2019 showing moderate, nonobstructive disease.  She has been experiencing episodic abdominal bloating associated with epigastric and precordial pain, along with increased abdominal and upper GI gas and belching, over the past few weeks.  An episode typically last about an hour and resolve spontaneously.  She was recently seen in the emergency department due to symptoms, and work-up was unremarkable, with normal troponins, unchanged ECG, and CT of the chest that was negative for PE.  Diverticulosis without diverticulitis was noted on abdominal imaging.  She is concerned about symptoms, and also notes a several year history of decline in activity tolerance.  She  is an avid hiker and has noted that this is become more difficult over the recent year.  We agreed to a Lexiscan Myoview to rule out ischemia.  She is planning to follow-up with GI and provided that stress testing is low risk, she would be able to undergo endoscopy/colonoscopy if necessary.  2.  PSVT: Quiescent.  She is not on any AV nodal blocking agents secondary to baseline bradycardia.  3.  Presyncope: No recent symptoms.  4.  Hyperlipidemia: She remains on rosuvastatin therapy.  Lipids are followed by her primary care provider.  5.  Disposition: Follow-up Lexiscan Myoview.  Provided that this is low risk, follow-up in 3 months.  Nicolasa Ducking, NP 10/02/2021, 8:50 AM

## 2021-10-08 ENCOUNTER — Encounter
Admission: RE | Admit: 2021-10-08 | Discharge: 2021-10-08 | Disposition: A | Discharge status: Home / Self Care | Payer: Medicare PPO | Source: Ambulatory Visit | Attending: Nurse Practitioner | Admitting: Nurse Practitioner

## 2021-10-08 DIAGNOSIS — R072 Precordial pain: Secondary | ICD-10-CM | POA: Insufficient documentation

## 2021-10-08 LAB — NM MYOCAR MULTI W/SPECT W/WALL MOTION / EF
Base ST Depression (mm): 0 mm
LV dias vol: 35 mL (ref 46–106)
LV sys vol: 10 mL
Nuc Stress EF: 71 %
Peak HR: 93 {beats}/min
Percent HR: 65 %
Rest HR: 55 {beats}/min
Rest Nuclear Isotope Dose: 10.5 mCi
SDS: 0
SRS: 3
SSS: 0
ST Depression (mm): 0 mm
Stress Nuclear Isotope Dose: 30.9 mCi
TID: 0.94

## 2021-10-08 MED ORDER — TECHNETIUM TC 99M TETROFOSMIN IV KIT
30.8900 | PACK | Freq: Once | INTRAVENOUS | Status: AC | PRN
  Administered 2021-10-08: 30.89 via INTRAVENOUS

## 2021-10-08 MED ORDER — REGADENOSON 0.4 MG/5ML IV SOLN
0.4000 mg | Freq: Once | INTRAVENOUS | Status: AC
  Administered 2021-10-08: 0.4 mg via INTRAVENOUS

## 2021-10-08 MED ORDER — TECHNETIUM TC 99M TETROFOSMIN IV KIT: 10.0000 | PACK | Freq: Once | INTRAVENOUS | Status: DC

## 2021-10-08 MED ORDER — TECHNETIUM TC 99M TETROFOSMIN IV KIT
10.0000 | PACK | Freq: Once | INTRAVENOUS | Status: AC | PRN
  Administered 2021-10-08: 10.48 via INTRAVENOUS

## 2021-10-15 ENCOUNTER — Encounter: Payer: Self-pay | Admitting: *Deleted

## 2021-10-16 ENCOUNTER — Ambulatory Visit
Admission: RE | Admit: 2021-10-16 | Discharge: 2021-10-16 | Disposition: A | Discharge status: Home / Self Care | Payer: Medicare PPO | Source: Ambulatory Visit | Attending: Gastroenterology | Admitting: Gastroenterology

## 2021-10-16 ENCOUNTER — Other Ambulatory Visit: Payer: Self-pay

## 2021-10-16 ENCOUNTER — Ambulatory Visit: Payer: Medicare PPO | Admitting: Anesthesiology

## 2021-10-16 ENCOUNTER — Encounter
Admission: RE | Disposition: A | Discharge status: Home / Self Care | Payer: Self-pay | Source: Ambulatory Visit | Attending: Gastroenterology

## 2021-10-16 ENCOUNTER — Encounter: Payer: Self-pay | Admitting: *Deleted

## 2021-10-16 DIAGNOSIS — I2510 Atherosclerotic heart disease of native coronary artery without angina pectoris: Secondary | ICD-10-CM | POA: Insufficient documentation

## 2021-10-16 DIAGNOSIS — K449 Diaphragmatic hernia without obstruction or gangrene: Secondary | ICD-10-CM | POA: Insufficient documentation

## 2021-10-16 DIAGNOSIS — E785 Hyperlipidemia, unspecified: Secondary | ICD-10-CM | POA: Insufficient documentation

## 2021-10-16 DIAGNOSIS — Z8719 Personal history of other diseases of the digestive system: Secondary | ICD-10-CM | POA: Insufficient documentation

## 2021-10-16 DIAGNOSIS — J45909 Unspecified asthma, uncomplicated: Secondary | ICD-10-CM | POA: Insufficient documentation

## 2021-10-16 DIAGNOSIS — K317 Polyp of stomach and duodenum: Secondary | ICD-10-CM | POA: Diagnosis not present

## 2021-10-16 DIAGNOSIS — K219 Gastro-esophageal reflux disease without esophagitis: Secondary | ICD-10-CM | POA: Diagnosis present

## 2021-10-16 DIAGNOSIS — Z8711 Personal history of peptic ulcer disease: Secondary | ICD-10-CM | POA: Diagnosis not present

## 2021-10-16 DIAGNOSIS — Z87891 Personal history of nicotine dependence: Secondary | ICD-10-CM | POA: Diagnosis not present

## 2021-10-16 HISTORY — PX: ESOPHAGOGASTRODUODENOSCOPY (EGD) WITH PROPOFOL: SHX5813

## 2021-10-16 SURGERY — ESOPHAGOGASTRODUODENOSCOPY (EGD) WITH PROPOFOL
Anesthesia: General

## 2021-10-16 MED ORDER — SODIUM CHLORIDE 0.9 % IV SOLN: INTRAVENOUS | Status: DC

## 2021-10-16 MED ORDER — PROPOFOL 10 MG/ML IV BOLUS
INTRAVENOUS | Status: DC | PRN
  Administered 2021-10-16: 50 mg via INTRAVENOUS

## 2021-10-16 MED ORDER — PROPOFOL 500 MG/50ML IV EMUL
INTRAVENOUS | Status: DC | PRN
  Administered 2021-10-16: 200 ug/kg/min via INTRAVENOUS

## 2021-10-16 NOTE — Anesthesia Preprocedure Evaluation (Signed)
Anesthesia Evaluation  Patient identified by MRN, date of birth, ID band Patient awake    Reviewed: Allergy & Precautions, NPO status , Patient's Chart, lab work & pertinent test results  History of Anesthesia Complications Negative for: history of anesthetic complications  Airway Mallampati: III  TM Distance: <3 FB Neck ROM: full    Dental  (+) Chipped   Pulmonary neg shortness of breath, asthma , former smoker,    Pulmonary exam normal        Cardiovascular (-) angina+ CAD  (-) Past MI Normal cardiovascular exam     Neuro/Psych PSYCHIATRIC DISORDERS  Neuromuscular disease    GI/Hepatic Neg liver ROS, hiatal hernia, PUD, GERD  Controlled,  Endo/Other  negative endocrine ROS  Renal/GU negative Renal ROS  negative genitourinary   Musculoskeletal   Abdominal   Peds  Hematology negative hematology ROS (+)   Anesthesia Other Findings Past Medical History: No date: Anxiety No date: Asthma No date: Atypical chest pain No date: Barrett's esophagus No date: Chronic gastritis No date: Diverticulosis No date: Duodenal ulcer No date: Gastritis No date: GERD (gastroesophageal reflux disease) No date: Hiatal hernia No date: History of echocardiogram     Comment:  a. 08/2020 Echo: EF 60-65%, no rwma, Nl RV fxn, mild MR. No date: Hyperlipidemia No date: Non-obstructive CAD (coronary artery disease)     Comment:  a. 10/2017 Stress Echo: Nl EF, no ischemia. Mild MR; b.               12/2017 Cardiac CTA: LM nl, LAD 50p, 30d, D1/2 nl, RI nl,               LCX nl, OM1/2 nl, RCA nl, RPDA/RPL nl. Ca2+ score = 39               (46th percentile)-->statin dose escalated. No date: Osteopenia No date: PSVT (paroxysmal supraventricular tachycardia) ([facility])     Comment:  a. 10/2017 Holter: PSVT, max 5 beats->did not tolerate               beta blocker; b. 09/2020 Zio: 38 runs of SVT. Longest 18.1              secs (106), fastest 203 bpm  (6 beats). Occas PVCs (1.1%)               - some assoc w/ triggered events. No date: Pulmonary nodules     Comment:  a. 12/2017 RLL 41m nodule noted on Cardiac CTA; b. 04/2018              RLL 956mnodule resolved; c. 09/2018 CT Chest: Previously               noted posterior RUL nodules almost completely resolved.               Bandlike scarring of RML and lingula-->consistent w/               improved atypical infxt. 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  Past Surgical History: 2008: BREAST BIOPSY; Left     Comment:  neg 2004: BREAST EXCISIONAL BIOPSY; Left     Comment:  neg No date: CATARACT EXTRACTION W/ INTRAOCULAR LENS  IMPLANT, BILATERAL;  Bilateral 01/22/2017: COLONOSCOPY WITH PROPOFOL; N/A     Comment:  Procedure: COLONOSCOPY WITH PROPOFOL;  Surgeon: [place]              [place]  K, MD;  Location: [facility] ENDOSCOPY;  Service:               Gastroenterology;  Laterality: N/A; 01/20/2015: ESOPHAGOGASTRODUODENOSCOPY (EGD) WITH PROPOFOL; N/A     Comment:  Procedure: ESOPHAGOGASTRODUODENOSCOPY (EGD) WITH               PROPOFOL;  Surgeon: Lollie Sails, MD;  Location:               [facility] ENDOSCOPY;  Service: Endoscopy;  Laterality: N/A; 01/22/2017: ESOPHAGOGASTRODUODENOSCOPY (EGD) WITH PROPOFOL; N/A     Comment:  Procedure: ESOPHAGOGASTRODUODENOSCOPY (EGD) WITH               PROPOFOL;  Surgeon: Toledo, Benay Pike, MD;  Location:               [facility] ENDOSCOPY;  Service: Gastroenterology;  Laterality:               N/A; 04/14/2018: ESOPHAGOGASTRODUODENOSCOPY (EGD) WITH PROPOFOL; N/A     Comment:  Procedure: ESOPHAGOGASTRODUODENOSCOPY (EGD) WITH               PROPOFOL;  Surgeon: Lollie Sails, MD;  Location:               [facility] ENDOSCOPY;  Service: Endoscopy;  Laterality: N/A; No date: EYE SURGERY; Right     Comment:  laser scraping 02/09/2018: OPEN REDUCTION INTERNAL FIXATION (ORIF)  DISTAL RADIAL  FRACTURE; Right     Comment:  Procedure: OPEN REDUCTION INTERNAL FIXATION (ORIF)               DISTAL RADIAL FRACTURE;  Surgeon: Hessie Knows, MD;                Location: [facility] ORS;  Service: Orthopedics;  Laterality:               Right; No date: TONSILLECTOMY     Reproductive/Obstetrics negative OB ROS                             Anesthesia Physical Anesthesia Plan  ASA: 3  Anesthesia Plan: General   Post-op Pain Management:    Induction: Intravenous  PONV Risk Score and Plan: Propofol infusion and TIVA  Airway Management Planned: Natural Airway and Nasal Cannula  Additional Equipment:   Intra-op Plan:   Post-operative Plan:   Informed Consent: I have reviewed the patients History and Physical, chart, labs and discussed the procedure including the risks, benefits and alternatives for the proposed anesthesia with the patient or authorized representative who has indicated his/her understanding and acceptance.     Dental Advisory Given  Plan Discussed with: Anesthesiologist, CRNA and Surgeon  Anesthesia Plan Comments: (Patient consented for risks of anesthesia including but not limited to:  - adverse reactions to medications - risk of airway placement if required - damage to eyes, teeth, lips or other oral mucosa - nerve damage due to positioning  - sore throat or hoarseness - Damage to heart, brain, nerves, lungs, other parts of body or loss of life  Patient voiced understanding.)        Anesthesia Quick Evaluation

## 2021-10-16 NOTE — Anesthesia Procedure Notes (Signed)
Date/Time: 10/16/2021 12:55 PM  Performed by: Nelda Marseille, CRNAPre-anesthesia Checklist: Patient identified, Emergency Drugs available, Suction available, Patient being monitored and Timeout performed Oxygen Delivery Method: Nasal cannula

## 2021-10-16 NOTE — Interval H&P Note (Signed)
History and Physical Interval Note:  10/16/2021 12:50 PM  Alison Bradshaw  has presented today for surgery, with the diagnosis of GERD.  The various methods of treatment have been discussed with the patient and family. After consideration of risks, benefits and other options for treatment, the patient has consented to  Procedure(s): ESOPHAGOGASTRODUODENOSCOPY (EGD) WITH PROPOFOL (N/A) as a surgical intervention.  The patient's history has been reviewed, patient examined, no change in status, stable for surgery.  I have reviewed the patient's chart and labs.  Questions were answered to the patient's satisfaction.     Lesly Rubenstein  Ok to proceed with EGD

## 2021-10-16 NOTE — Transfer of Care (Signed)
Immediate Anesthesia Transfer of Care Note  Patient: Alison Bradshaw  Procedure(s) Performed: ESOPHAGOGASTRODUODENOSCOPY (EGD) WITH PROPOFOL  Patient Location: PACU  Anesthesia Type:General  Level of Consciousness: sedated  Airway & Oxygen Therapy: Patient Spontanous Breathing and Patient connected to nasal cannula oxygen  Post-op Assessment: Report given to RN and Post -op Vital signs reviewed and stable  Post vital signs: Reviewed and stable  Last Vitals:  Vitals Value Taken Time  BP 107/45 10/16/21 1304  Temp 36.4 C 10/16/21 1304  Pulse 56 10/16/21 1307  Resp 12 10/16/21 1307  SpO2 99 % 10/16/21 1307  Vitals shown include unvalidated device data.  Last Pain:  Vitals:   10/16/21 1304  TempSrc: Temporal  PainSc: Asleep         Complications: No notable events documented.

## 2021-10-16 NOTE — Op Note (Addendum)
[facility] Patient Name: Alison Bradshaw Procedure Date: 10/16/2021 12:43 PM MRN: [id number] Account #: [id number] Date of Birth: 01-08-1941 Admit Type: Outpatient Age: 81 Room: [facility] ENDO ROOM 1 Gender: Female Note Status: Supervisor Override Instrument Name: Upper Endoscope [id number] Procedure:             Upper GI endoscopy Indications:           Gastro-esophageal reflux disease Providers:             Andrey Farmer MD, MD Referring MD:          Baxter Hire, MD (Referring MD) Medicines:             Monitored Anesthesia Care Complications:         No immediate complications. Estimated blood loss:                         Minimal. Procedure:             Pre-Anesthesia Assessment:                        - Prior to the procedure, a History and Physical was                         performed, and patient medications and allergies were                         reviewed. The patient is competent. The risks and                         benefits of the procedure and the sedation options and                         risks were discussed with the patient. All questions                         were answered and informed consent was obtained.                         Patient identification and proposed procedure were                         verified by the physician, the nurse, the                         anesthesiologist, the anesthetist and the technician                         in the endoscopy suite. Mental Status Examination:                         alert and oriented. Airway Examination: normal                         oropharyngeal airway and neck mobility. Respiratory                         Examination: clear to auscultation. CV Examination:  normal. Prophylactic Antibiotics: The patient does not                         require prophylactic antibiotics. Prior                         Anticoagulants: The patient has taken no  previous                         anticoagulant or antiplatelet agents. ASA Grade                         Assessment: II - A patient with mild systemic disease.                         After reviewing the risks and benefits, the patient                         was deemed in satisfactory condition to undergo the                         procedure. The anesthesia plan was to use monitored                         anesthesia care (MAC). Immediately prior to                         administration of medications, the patient was                         re-assessed for adequacy to receive sedatives. The                         heart rate, respiratory rate, oxygen saturations,                         blood pressure, adequacy of pulmonary ventilation, and                         response to care were monitored throughout the                         procedure. The physical status of the patient was                         re-assessed after the procedure.                        After obtaining informed consent, the endoscope was                         passed under direct vision. Throughout the procedure,                         the patient's blood pressure, pulse, and oxygen                         saturations were monitored continuously. The  Endosonoscope was introduced through the mouth, and                         advanced to the second part of duodenum. The upper GI                         endoscopy was accomplished without difficulty. The                         patient tolerated the procedure well. Findings:      A small hiatal hernia was present.      The exam of the esophagus was otherwise normal.      Multiple small sessile fundic gland polyps with no stigmata of recent       bleeding were found in the gastric fundus and in the gastric body.      The exam of the stomach was otherwise normal.      Biopsies were taken with a cold forceps in the stomach for Helicobacter        pylori testing. Estimated blood loss was minimal.      The examined duodenum was normal. Impression:            - Small hiatal hernia.                        - Multiple fundic gland polyps.                        - Normal examined duodenum.                        - Biopsies were taken with a cold forceps for                         Helicobacter pylori testing. Recommendation:        - Discharge patient to home.                        - Resume previous diet.                        - Continue present medications.                        - Await pathology results.                        - Return to referring physician as previously                         scheduled. Procedure Code(s):     --- Professional ---                        [phone], Esophagogastroduodenoscopy, flexible,                         transoral; with biopsy, single or multiple Diagnosis Code(s):     --- Professional ---                        K44.9, Diaphragmatic hernia without obstruction or  gangrene                        K31.7, Polyp of stomach and duodenum                        R10.13, Epigastric pain CPT copyright 2019 [employer]. All rights reserved. The codes documented in this report are preliminary and upon coder review may  be revised to meet current compliance requirements. Andrey Farmer MD, MD 10/16/2021 1:04:59 PM Number of Addenda: 0 Note Initiated On: 10/16/2021 12:43 PM Estimated Blood Loss:  Estimated blood loss was minimal.      [facility]

## 2021-10-16 NOTE — H&P (Signed)
Outpatient short stay form Pre-procedure 10/16/2021  Lesly Rubenstein, MD  Primary Physician: Baxter Hire, MD  Reason for visit:  Epigastric pain  History of present illness:    81 y/o lady with anxiety here for EGD for epigastric pain. No blood thinners. No significant family history of GI malignancies.    Current Facility-Administered Medications:    0.9 %  sodium chloride infusion, , Intravenous, Continuous, Jodeen Mclin, Hilton Cork, MD, Last Rate: 20 mL/hr at 10/16/21 1240, New Bag at 10/16/21 1240  Medications Prior to Admission  Medication Sig Dispense Refill Last Dose   ALPRAZolam (XANAX) 0.25 MG tablet Take 0.25 mg by mouth 2 (two) times daily as needed for anxiety.   10/15/2021   pantoprazole (PROTONIX) 40 MG tablet Take 40 mg by mouth daily.   10/15/2021   sucralfate (CARAFATE) 1 G tablet Take 1 g by mouth 4 (four) times daily -  with meals and at bedtime. Pt only takes when she's really needs it which isn't very often   10/15/2021   triamcinolone (NASACORT ALLERGY 24HR) 55 MCG/ACT AERO nasal inhaler Place 2 sprays into the nose daily.      acetaminophen (TYLENOL) 500 MG tablet Take 1,000 mg by mouth every 6 (six) hours as needed.      albuterol (PROVENTIL HFA;VENTOLIN HFA) 108 (90 Base) MCG/ACT inhaler Inhale 2 puffs into the lungs every 6 (six) hours as needed for shortness of breath. 3 Inhaler 0    azelastine (ASTELIN) 0.1 % nasal spray Place 1 spray into the nose in the morning and at bedtime.      Fluticasone-Salmeterol (AIRDUO RESPICLICK 443/15) 400-86 MCG/ACT AEPB Inhale 1 puff into the lungs 2 (two) times daily. 1 each 10    Probiotic Product (PHILLIPS COLON HEALTH PO) Take 1 capsule by mouth daily.      rosuvastatin (CRESTOR) 10 MG tablet Take 10 mg by mouth daily.        Allergies  Allergen Reactions   Percocet [Oxycodone-Acetaminophen] Nausea Only   Ceftin [Cefuroxime Axetil] Hives and Rash   Timentin [Ticarcillin-Pot Clavulanate] Hives and Rash     Past  Medical History:  Diagnosis Date   Anxiety    Asthma    Atypical chest pain    Barrett's esophagus    Chronic gastritis    Diverticulosis    Duodenal ulcer    Gastritis    GERD (gastroesophageal reflux disease)    Hiatal hernia    History of echocardiogram    a. 08/2020 Echo: EF 60-65%, no rwma, Nl RV fxn, mild MR.   Hyperlipidemia    Non-obstructive CAD (coronary artery disease)    a. 10/2017 Stress Echo: Nl EF, no ischemia. Mild MR; b. 12/2017 Cardiac CTA: LM nl, LAD 50p, 30d, D1/2 nl, RI nl, LCX nl, OM1/2 nl, RCA nl, RPDA/RPL nl. Ca2+ score = 39 (46th percentile)-->statin dose escalated.   Osteopenia    PSVT (paroxysmal supraventricular tachycardia) ([place])    a. 10/2017 Holter: PSVT, max 5 beats->did not tolerate beta blocker; b. 09/2020 Zio: 38 runs of SVT. Longest 18.1 secs (106), fastest 203 bpm (6 beats). Occas PVCs (1.1%) - some assoc w/ triggered events.   Pulmonary nodules    a. 12/2017 RLL 64m nodule noted on Cardiac CTA; b. 04/2018 RLL 952mnodule resolved; c. 09/2018 CT Chest: Previously noted posterior RUL nodules almost completely resolved. Bandlike scarring of RML and lingula-->consistent w/ improved atypical infxt.   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    Physical Exam  Gen: Alert, oriented. Appears stated age.  HEENT: PERRLA. Lungs: No respiratory distress CV: RRR Abd: soft, benign, no masses. Ext: No edema    Planned procedures: Proceed with EGD. The patient understands the nature of the planned procedure, indications, risks, alternatives and potential complications including but not limited to bleeding, infection, perforation, damage to internal organs and possible oversedation/side effects from anesthesia. The patient agrees and gives consent to proceed.  Please refer to procedure notes for findings, recommendations and patient  disposition/instructions.     Lesly Rubenstein, MD [facility]

## 2021-10-17 ENCOUNTER — Encounter: Payer: Self-pay | Admitting: Gastroenterology

## 2021-10-17 LAB — SURGICAL PATHOLOGY

## 2021-10-17 NOTE — Anesthesia Postprocedure Evaluation (Signed)
Anesthesia Post Note  Patient: Alison Bradshaw  Procedure(s) Performed: ESOPHAGOGASTRODUODENOSCOPY (EGD) WITH PROPOFOL  Patient location during evaluation: Endoscopy Anesthesia Type: General Level of consciousness: awake and alert Pain management: pain level controlled Vital Signs Assessment: post-procedure vital signs reviewed and stable Respiratory status: spontaneous breathing, nonlabored ventilation, respiratory function stable and patient connected to nasal cannula oxygen Cardiovascular status: blood pressure returned to baseline and stable Postop Assessment: no apparent nausea or vomiting Anesthetic complications: no   No notable events documented.   Last Vitals:  Vitals:   10/16/21 1330 10/16/21 1334  BP: (!) 112/59 123/60  Pulse: (!) 55 (!) 54  Resp: 18 17  Temp:    SpO2: 100% 100%    Last Pain:  Vitals:   10/17/21 0729  TempSrc:   PainSc: 0-No pain                 Precious Haws Jazmyne Beauchesne

## 2022-01-08 ENCOUNTER — Encounter: Payer: Self-pay | Admitting: Nurse Practitioner

## 2022-01-08 ENCOUNTER — Ambulatory Visit: Payer: Medicare PPO | Attending: Nurse Practitioner | Admitting: Nurse Practitioner

## 2022-01-08 VITALS — BP 124/62 | HR 55 | Ht 63.25 in | Wt 138.4 lb

## 2022-01-08 DIAGNOSIS — I471 Supraventricular tachycardia, unspecified: Secondary | ICD-10-CM

## 2022-01-08 DIAGNOSIS — R072 Precordial pain: Secondary | ICD-10-CM

## 2022-01-08 DIAGNOSIS — E782 Mixed hyperlipidemia: Secondary | ICD-10-CM | POA: Diagnosis not present

## 2022-01-08 NOTE — Patient Instructions (Signed)
Medication Instructions:   Your physician recommends that you continue on your current medications as directed. Please refer to the Current Medication list given to you today.  *If you need a refill on your cardiac medications before your next appointment, please call your pharmacy*  Follow-Up: At [facility], you and your health needs are our priority.  As part of our continuing mission to provide you with exceptional heart care, we have created designated Provider Care Teams.  These Care Teams include your primary Cardiologist (physician) and Advanced Practice Providers (APPs -  Physician Assistants and Nurse Practitioners) who all work together to provide you with the care you need, when you need it.  We recommend signing up for the patient portal called "MyChart".  Sign up information is provided on this After Visit Summary.  MyChart is used to connect with patients for Virtual Visits (Telemedicine).  Patients are able to view lab/test results, encounter notes, upcoming appointments, etc.  Non-urgent messages can be sent to your provider as well.   To learn more about what you can do with MyChart, go to [email or web address].    Your next appointment:   6 month(s)  The format for your next appointment:   In Person  Provider:   You may see Muhammad Arida, MD or one of the following Advanced Practice Providers on your designated Care Team:   Christopher Berge, NP Ryan Dunn, PA-C Cadence Furth, PA-C Sheri Hammock, NP    Other Instructions   Important Information About Sugar       

## 2022-01-08 NOTE — Progress Notes (Signed)
Office Visit    Patient Name: Alison Bradshaw Date of Encounter: 01/08/2022  Primary Care Provider:  Baxter Hire, MD Primary Cardiologist:  Kathlyn Sacramento, MD  Chief Complaint    81 year old female with history of atypical chest pain, nonobstructive CAD, PSVT, hyperlipidemia, and GERD, who presents for follow-up of chest pain and dyspnea.  Past Medical History    Past Medical History:  Diagnosis Date   Anxiety    Asthma    Atypical chest pain    Barrett's esophagus    Chronic gastritis    Diverticulosis    Duodenal ulcer    Gastritis    GERD (gastroesophageal reflux disease)    Hiatal hernia    History of echocardiogram    a. 08/2020 Echo: EF 60-65%, no rwma, Nl RV fxn, mild MR.   Hyperlipidemia    Non-obstructive CAD (coronary artery disease)    a. 10/2017 Stress Echo: Nl EF, no ischemia. Mild MR; b. 12/2017 Cardiac CTA: LM nl, LAD 50p, 30d, D1/2 nl, RI nl, LCX nl, OM1/2 nl, RCA nl, RPDA/RPL nl. Ca2+ score = 39 (46th percentile)-->statin dose escalated; c. 10/2021 MV: No ischemia/infarct. Nl LV fxn.   Osteopenia    PSVT (paroxysmal supraventricular tachycardia)    a. 10/2017 Holter: PSVT, max 5 beats->did not tolerate beta blocker; b. 09/2020 Zio: 38 runs of SVT. Longest 18.1 secs (106), fastest 203 bpm (6 beats). Occas PVCs (1.1%) - some assoc w/ triggered events.   Pulmonary nodules    a. 12/2017 RLL 34m nodule noted on Cardiac CTA; b. 04/2018 RLL 941mnodule resolved; c. 09/2018 CT Chest: Previously noted posterior RUL nodules almost completely resolved. Bandlike scarring of RML and lingula-->consistent w/ improved atypical infxt.   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   Past Surgical History:  Procedure Laterality Date   BREAST BIOPSY Left 2008   neg   BREAST EXCISIONAL BIOPSY Left 2004   neg   CATARACT EXTRACTION W/ INTRAOCULAR LENS  IMPLANT, BILATERAL Bilateral     COLONOSCOPY WITH PROPOFOL N/A 01/22/2017   Procedure: COLONOSCOPY WITH PROPOFOL;  Surgeon: Toledo, TeBenay PikeMD;  Location: [facility] ENDOSCOPY;  Service: Gastroenterology;  Laterality: N/A;   ESOPHAGOGASTRODUODENOSCOPY (EGD) WITH PROPOFOL N/A 01/20/2015   Procedure: ESOPHAGOGASTRODUODENOSCOPY (EGD) WITH PROPOFOL;  Surgeon: MaLollie SailsMD;  [facility];  Service: Endoscopy;  Laterality: N/A;   ESOPHAGOGASTRODUODENOSCOPY (EGD) WITH PROPOFOL N/A 01/22/2017   Procedure: ESOPHAGOGASTRODUODENOSCOPY (EGD) WITH PROPOFOL;  Surgeon: Toledo, TeBenay PikeMD;  Location: [facility] ENDOSCOPY;  Service: Gastroenterology;  Laterality: N/A;   ESOPHAGOGASTRODUODENOSCOPY (EGD) WITH PROPOFOL N/A 04/14/2018   Procedure: ESOPHAGOGASTRODUODENOSCOPY (EGD) WITH PROPOFOL;  Surgeon: SkLollie SailsMD;  [facility];  Service: Endoscopy;  Laterality: N/A;   ESOPHAGOGASTRODUODENOSCOPY (EGD) WITH PROPOFOL N/A 10/16/2021   Procedure: ESOPHAGOGASTRODUODENOSCOPY (EGD) WITH PROPOFOL;  Surgeon: LoLesly RubensteinMD;  Location: [facility] ENDOSCOPY;  Service: Endoscopy;  Laterality: N/A;   EYE SURGERY Right    laser scraping   OPEN REDUCTION INTERNAL FIXATION (ORIF) DISTAL RADIAL FRACTURE Right 02/09/2018   Procedure: OPEN REDUCTION INTERNAL FIXATION (ORIF) DISTAL RADIAL FRACTURE;  Surgeon: MeHessie KnowsMD;  Location: [facility] ORS;  Service: Orthopedics;  Laterality: Right;   TONSILLECTOMY      Allergies  Allergies  Allergen Reactions   Percocet [Oxycodone-Acetaminophen] Nausea Only   Ceftin [Cefuroxime Axetil] Hives and Rash   Timentin [Ticarcillin-Pot Clavulanate] Hives and Rash    History  of Present Illness    81 year old female with above past medical history including atypical chest pain, nonobstructive CAD, PSVT, hyperlipidemia, and GERD.  She was evaluated in 2019 for atypical chest pain with echo showing normal LV function.  Coronary CT angiogram in September 2019, showed a calcium score of 39 with  mild nonobstructive CAD in the LAD distribution.  At the time, she also reported palpitations, and a Holter monitor showed short runs of SVT.  She tried beta-blocker therapy but this was discontinued secondary to complaints of dizziness.  In April 2022, she reported palpitations and presyncope.  Echo in May 2022 showed an EF of 60 to 65% without regional wall motion abnormalities, normal RV function, and mild MR.  Zio monitoring in June 2022, showed 38 runs of SVT, the longest of which was 18.1 seconds at a rate of 106 bpm.  Fastest episode consisted of 6 beats at 203 bpm.  She was also noted to have occasional PVCs at a 1.1% burden.  Recommendation was made to consider low-dose metoprolol however, we agreed to defer in the setting of baseline bradycardia and soft blood pressures.  In the spring 2023, she began to experience intermittent abdominal discomfort that radiated into her precordium and throat.  She was initially seen in the emergency department with unremarkable work-up and then was seen in clinic here.  She underwent a Lexiscan Myoview which showed normal LV function and no evidence of ischemia or infarct.  No significant aortic or coronary calcifications were noted.  Since her last visit, she has mostly done well.  She has had occasional episodes, beginning prior to her last visit, where she has a sensation of weakness or tiredness which is most likely to occur late in the afternoon when she is sitting down and notes that she sometimes just feels exhausted and needs to take a 30-minute nap.  She overall feels like she sleeps well at night though sometimes has GI issues that disrupt her sleep.  She is not aware of any prior history of snoring and does not think she has sleep apnea.  She has not had any significant chest pain and denies dyspnea, palpitations, PND, orthopnea, dizziness, syncope, or early satiety.  In the setting of bilateral lower extremity varicose veins, she does sometimes note mild  swelling or redness/discoloration of the left lower leg, which is not new.  Home Medications    Current Outpatient Medications  Medication Sig Dispense Refill   acetaminophen (TYLENOL) 500 MG tablet Take 1,000 mg by mouth every 6 (six) hours as needed.     albuterol (PROVENTIL HFA;VENTOLIN HFA) 108 (90 Base) MCG/ACT inhaler Inhale 2 puffs into the lungs every 6 (six) hours as needed for shortness of breath. 3 Inhaler 0   ALPRAZolam (XANAX) 0.25 MG tablet Take 0.25 mg by mouth 2 (two) times daily as needed for anxiety.     azelastine (ASTELIN) 0.1 % nasal spray Place 1 spray into the nose in the morning and at bedtime.     pantoprazole (PROTONIX) 40 MG tablet Take 40 mg by mouth daily.     Probiotic Product (PHILLIPS COLON HEALTH PO) Take 1 capsule by mouth daily.     rosuvastatin (CRESTOR) 10 MG tablet Take 10 mg by mouth daily.     sucralfate (CARAFATE) 1 G tablet Take 1 g by mouth in the morning, at noon, and at bedtime. Pt only takes when she's really needs it which isn't very often     Fluticasone-Salmeterol (AIRDUO RESPICLICK 272/53)  113-14 MCG/ACT AEPB Inhale 1 puff into the lungs 2 (two) times daily. (Patient not taking: Reported on 01/08/2022) 1 each 10   triamcinolone (NASACORT ALLERGY 24HR) 55 MCG/ACT AERO nasal inhaler Place 2 sprays into the nose daily. (Patient not taking: Reported on 01/08/2022)     No current facility-administered medications for this visit.     Review of Systems    Sometimes feels tired at the end of the day.  Occasional lower extremity swelling in setting of varicose veins.  She denies chest pain, dyspnea, palpitations, PND, orthopnea, dizziness, syncope, or early satiety.  All other systems reviewed and are otherwise negative except as noted above.    Physical Exam    VS:  BP 124/62 (BP Location: Left Arm, Patient Position: Sitting, Cuff Size: Normal)   Pulse (!) 55   Ht 5' 3.25" (1.607 m)   Wt 138 lb 6.4 oz (62.8 kg)   SpO2 100%   BMI 24.32 kg/m  ,  BMI Body mass index is 24.32 kg/m.     GEN: Well nourished, well developed, in no acute distress. HEENT: normal. Neck: Supple, no JVD, carotid bruits, or masses. Cardiac: RRR, no murmurs, rubs, or gallops. No clubbing, cyanosis, edema.  Radials/PT 2+ and equal bilaterally.  Respiratory:  Respirations regular and unlabored, clear to auscultation bilaterally. GI: Soft, nontender, nondistended, BS + x 4. MS: no deformity or atrophy. Skin: warm and dry, no rash. Neuro:  Strength and sensation are intact. Psych: Normal affect.  Accessory Clinical Findings    ECG personally reviewed by me today -sinus bradycardia, 55, septal infarct- no acute changes.  Lab Results  Component Value Date   WBC 4.7 09/18/2021   HGB 13.6 09/18/2021   HCT 40.8 09/18/2021   MCV 87.0 09/18/2021   PLT 199 09/18/2021   Lab Results  Component Value Date   CREATININE 0.79 09/18/2021   BUN 11 09/18/2021   NA 139 09/18/2021   K 3.8 09/19/2021   CL 103 09/18/2021   CO2 29 09/18/2021   Lab Results  Component Value Date   ALT 16 09/18/2021   AST 28 09/18/2021   ALKPHOS 75 09/18/2021   BILITOT 1.1 09/18/2021    Assessment & Plan    1.  Precordial chest pain/nonobstructive CAD: Moderate, nonobstructive CAD noted on coronary CT angiogram in September 2019.  She reported precordial chest discomfort earlier this year and underwent stress testing, which was low risk.  She has not had any recurrent symptoms and has follow-up with GI in the coming month.  She remains on statin therapy.  2.  PSVT: Quiescent.  No AV nodal blocking agents secondary to baseline bradycardia.  3.  Presyncope: No recurrence.  4.  Hyperlipidemia: Remains on rosuvastatin therapy.  Lipids followed by primary care.  5.  Daytime somnolence: Sometimes, patient experiences sleepiness at the end of the day that resolves after taking about a 30-minute nap.  She thinks that she sleeps well at night but admits to sometimes disrupted sleep in  the setting of GI disturbances.  She does not think she has sleep apnea or has any history of snoring.  She does not experience presyncope or syncope and denies palpitations.  We discussed broad differential related to this diagnosis and given relative scarcity of symptoms, will hold off on any further work-up at this time.  6.  Disposition: Follow-up in clinic in 6 months or sooner if necessary.   Murray Hodgkins, NP 01/08/2022, 11:27 AM

## 2022-01-30 ENCOUNTER — Encounter: Payer: Self-pay | Admitting: Student

## 2022-01-30 ENCOUNTER — Ambulatory Visit: Payer: Medicare PPO | Admitting: Student

## 2022-01-30 VITALS — BP 116/72 | HR 61 | Temp 97.5°F | Ht 63.25 in | Wt 142.0 lb

## 2022-01-30 DIAGNOSIS — H60501 Unspecified acute noninfective otitis externa, right ear: Secondary | ICD-10-CM

## 2022-01-30 DIAGNOSIS — Z9109 Other allergy status, other than to drugs and biological substances: Secondary | ICD-10-CM

## 2022-01-30 DIAGNOSIS — J069 Acute upper respiratory infection, unspecified: Secondary | ICD-10-CM | POA: Insufficient documentation

## 2022-01-30 MED ORDER — CIPROFLOXACIN-DEXAMETHASONE 0.3-0.1 % OT SUSP: 4.0000 [drp] | Freq: Two times a day (BID) | OTIC | 0 refills | Status: AC

## 2022-01-30 MED ORDER — FLUTICASONE PROPIONATE 50 MCG/ACT NA SUSP: 2.0000 | Freq: Every day | NASAL | 4 refills | Status: DC

## 2022-01-30 NOTE — Patient Instructions (Signed)
You will continue ear drops for the next 7 days. 4 drops in the right ear  For your allergies, take two drops per nostril for 7 days then you can decrease to 1 spray per nostril daily until symptoms subside.

## 2022-01-30 NOTE — Progress Notes (Signed)
[facility] clinic  Provider: Unk Lightning  Code Status: Full Code Goals of Care:     01/30/2022    3:26 PM  Advanced Directives  Does Patient Have a Medical Advance Directive? Yes  Type of Paramedic of [place];Living will;Out of facility DNR (pink MOST or yellow form)  Does patient want to make changes to medical advance directive? No - Patient declined  Copy of [place] in Chart? No - copy requested     Chief Complaint  Patient presents with   Establish Care    New patient to establish care. Patient c/o right ear concerns, dripping, full feeling, and itching.     HPI: Patient is a 81 y.o. female seen today for an acute visit for ear pain in the right ear. It's been going on about a week or so. She had a headache yesterday, but it's gone. She had a headache for about 4 hours. She had a two week trip to [place]. Thought it was plane vs recent travel vs infection. No fevers and no chills. She started debrox 3d ago. She felt like it hasn't changed much.   Denies hx of infection but she has had fullness of ears. She has a history of allergies. She hasn't used any flonase recently, but she has in the past for her seasonal allergies.   Past Medical History:  Diagnosis Date   Anxiety    Asthma    Atypical chest pain    Barrett's esophagus    Chronic gastritis    Diverticulosis    Duodenal ulcer    Gastritis    GERD (gastroesophageal reflux disease)    Hiatal hernia    History of echocardiogram    a. 08/2020 Echo: EF 60-65%, no rwma, Nl RV fxn, mild MR.   Hyperlipidemia    Non-obstructive CAD (coronary artery disease)    a. 10/2017 Stress Echo: Nl EF, no ischemia. Mild MR; b. 12/2017 Cardiac CTA: LM nl, LAD 50p, 30d, D1/2 nl, RI nl, LCX nl, OM1/2 nl, RCA nl, RPDA/RPL nl. Ca2+ score = 39 (46th percentile)-->statin dose escalated; c. 10/2021 MV: No ischemia/infarct. Nl LV fxn.   Osteopenia    PSVT (paroxysmal supraventricular tachycardia)    a.  10/2017 Holter: PSVT, max 5 beats->did not tolerate beta blocker; b. 09/2020 Zio: 38 runs of SVT. Longest 18.1 secs (106), fastest 203 bpm (6 beats). Occas PVCs (1.1%) - some assoc w/ triggered events.   Pulmonary nodules    a. 12/2017 RLL 30m nodule noted on Cardiac CTA; b. 04/2018 RLL 965mnodule resolved; c. 09/2018 CT Chest: Previously noted posterior RUL nodules almost completely resolved. Bandlike scarring of RML and lingula-->consistent w/ improved atypical infxt.   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    Past Surgical History:  Procedure Laterality Date   BREAST BIOPSY Left 2008   neg   BREAST EXCISIONAL BIOPSY Left 2004   neg   CATARACT EXTRACTION W/ INTRAOCULAR LENS  IMPLANT, BILATERAL Bilateral    COLONOSCOPY WITH PROPOFOL N/A 01/22/2017   Procedure: COLONOSCOPY WITH PROPOFOL;  Surgeon: Toledo, TeBenay PikeMD;  Location: [facility] ENDOSCOPY;  Service: Gastroenterology;  Laterality: N/A;   ESOPHAGOGASTRODUODENOSCOPY (EGD) WITH PROPOFOL N/A 01/20/2015   Procedure: ESOPHAGOGASTRODUODENOSCOPY (EGD) WITH PROPOFOL;  Surgeon: MaLollie SailsMD;  [facility];  Service: Endoscopy;  Laterality: N/A;   ESOPHAGOGASTRODUODENOSCOPY (EGD) WITH PROPOFOL N/A 01/22/2017   Procedure: ESOPHAGOGASTRODUODENOSCOPY (EGD)  WITH PROPOFOL;  Surgeon: Toledo, Benay Pike, MD;  Location: [facility] ENDOSCOPY;  Service: Gastroenterology;  Laterality: N/A;   ESOPHAGOGASTRODUODENOSCOPY (EGD) WITH PROPOFOL N/A 04/14/2018   Procedure: ESOPHAGOGASTRODUODENOSCOPY (EGD) WITH PROPOFOL;  Surgeon: Lollie Sails, MD;  Location: [facility] ENDOSCOPY;  Service: Endoscopy;  Laterality: N/A;   ESOPHAGOGASTRODUODENOSCOPY (EGD) WITH PROPOFOL N/A 10/16/2021   Procedure: ESOPHAGOGASTRODUODENOSCOPY (EGD) WITH PROPOFOL;  Surgeon: Lesly Rubenstein, MD;  Location: [facility] ENDOSCOPY;  Service: Endoscopy;  Laterality: N/A;   EYE SURGERY Right    laser  scraping   OPEN REDUCTION INTERNAL FIXATION (ORIF) DISTAL RADIAL FRACTURE Right 02/09/2018   Procedure: OPEN REDUCTION INTERNAL FIXATION (ORIF) DISTAL RADIAL FRACTURE;  Surgeon: Hessie Knows, MD;  Location: [facility] ORS;  Service: Orthopedics;  Laterality: Right;   TONSILLECTOMY      Allergies  Allergen Reactions   Percocet [Oxycodone-Acetaminophen] Nausea Only   Ceftin [Cefuroxime Axetil] Hives and Rash   Timentin [Ticarcillin-Pot Clavulanate] Hives and Rash    Outpatient Encounter Medications as of 01/30/2022  Medication Sig   acetaminophen (TYLENOL) 500 MG tablet Take 1,000 mg by mouth every 6 (six) hours as needed.   albuterol (PROVENTIL HFA;VENTOLIN HFA) 108 (90 Base) MCG/ACT inhaler Inhale 2 puffs into the lungs every 6 (six) hours as needed for shortness of breath.   ALPRAZolam (XANAX) 0.25 MG tablet Take 0.25 mg by mouth 2 (two) times daily as needed for anxiety.   azelastine (ASTELIN) 0.1 % nasal spray Place 1 spray into the nose 2 (two) times daily as needed.   Fluticasone-Salmeterol (AIRDUO RESPICLICK 539/76) 734-19 MCG/ACT AEPB Inhale 1 puff into the lungs 2 (two) times daily.   pantoprazole (PROTONIX) 40 MG tablet Take 40 mg by mouth daily.   rosuvastatin (CRESTOR) 10 MG tablet Take 10 mg by mouth daily.   sucralfate (CARAFATE) 1 G tablet Take 1 g by mouth in the morning, at noon, and at bedtime. Pt only takes when she's really needs it which isn't very often   triamcinolone (NASACORT ALLERGY 24HR) 55 MCG/ACT AERO nasal inhaler Place 2 sprays into the nose daily.   [DISCONTINUED] Probiotic Product (PHILLIPS COLON HEALTH PO) Take 1 capsule by mouth daily.   No facility-administered encounter medications on file as of 01/30/2022.    Review of Systems:  Review of Systems  HENT:         Fullness in her ears   Health Maintenance  Topic Date Due   Medicare Annual Wellness (AWV)  Never done   Hepatitis C Screening  Never done   DEXA SCAN  Never done   Pneumonia Vaccine 75+  Years old (2 - PPSV23 or PCV20) 05/10/2017   INFLUENZA VACCINE  11/06/2021   TETANUS/TDAP  04/08/2022   Zoster Vaccines- Shingrix  Completed   HPV VACCINES  Aged Out   COLONOSCOPY (Pts 45-2yr Insurance coverage will need to be confirmed)  Discontinued   COVID-19 Vaccine  Discontinued    Physical Exam: Vitals:   01/30/22 1519  BP: 116/72  Pulse: 61  Temp: (!) 97.5 F (36.4 C)  TempSrc: Temporal  SpO2: 96%  Weight: 142 lb (64.4 kg)  Height: 5' 3.25" (1.607 m)   Body mass index is 24.96 kg/m. Physical Exam Constitutional:      Appearance: Normal appearance.  HENT:     Right Ear: Tympanic membrane normal.     Left Ear: Tympanic membrane and ear canal normal.     Ears:     Comments: Right canal with erythema     Nose:  Comments: Bilateral conchae boggy, edematous, and erythematous    Mouth/Throat:     Mouth: Mucous membranes are moist.     Pharynx: Oropharynx is clear.  Neurological:     Mental Status: She is alert.    Labs reviewed: Basic Metabolic Panel: Recent Labs    09/18/21 1646 09/19/21 0210  NA 139  --   K 5.6* 3.8  CL 103  --   CO2 29  --   GLUCOSE 97  --   BUN 11  --   CREATININE 0.79  --   CALCIUM 10.2  --    Liver Function Tests: Recent Labs    09/18/21 1855  AST 28  ALT 16  ALKPHOS 75  BILITOT 1.1  PROT 7.4  ALBUMIN 4.4   Recent Labs    09/18/21 1855  LIPASE 33   No results for input(s): "AMMONIA" in the last 8760 hours. CBC: Recent Labs    09/18/21 1646  WBC 4.7  HGB 13.6  HCT 40.8  MCV 87.0  PLT 199   Lipid Panel: No results for input(s): "CHOL", "HDL", "LDLCALC", "TRIG", "CHOLHDL", "LDLDIRECT" in the last 8760 hours. No results found for: "HGBA1C"  Procedures since last visit: No results found.  Assessment/Plan 1. Acute otitis externa of right ear, unspecified type Erythema of skin surrounding tympanic membrane most consistent with otitis externa. Discussed normal appearing eardrum. Discussed impact of  allergies on her symptoms. Will plan for otic drops with dex/cipro combination for 7 days.   2. Environmental allergies Long hx of allergies. Previously took flonase with success. Given symptoms would recommend restarting fluticasone 50 mc daily.     Labs/tests ordered:  * No order type specified * Next appt:  2 weeks to establish care

## 2022-02-22 ENCOUNTER — Ambulatory Visit: Payer: Self-pay | Admitting: Student

## 2022-02-27 DIAGNOSIS — R1030 Lower abdominal pain, unspecified: Secondary | ICD-10-CM | POA: Diagnosis present

## 2022-02-27 DIAGNOSIS — J45909 Unspecified asthma, uncomplicated: Secondary | ICD-10-CM | POA: Insufficient documentation

## 2022-02-27 DIAGNOSIS — Z85028 Personal history of other malignant neoplasm of stomach: Secondary | ICD-10-CM | POA: Diagnosis not present

## 2022-02-27 DIAGNOSIS — R110 Nausea: Secondary | ICD-10-CM | POA: Diagnosis not present

## 2022-02-27 DIAGNOSIS — Z7951 Long term (current) use of inhaled steroids: Secondary | ICD-10-CM | POA: Diagnosis not present

## 2022-02-27 DIAGNOSIS — D72829 Elevated white blood cell count, unspecified: Secondary | ICD-10-CM | POA: Diagnosis not present

## 2022-02-28 ENCOUNTER — Emergency Department
Admission: EM | Admit: 2022-02-28 | Discharge: 2022-02-28 | Disposition: A | Discharge status: Home / Self Care | Payer: Medicare PPO | Attending: Emergency Medicine | Admitting: Emergency Medicine

## 2022-02-28 ENCOUNTER — Other Ambulatory Visit: Payer: Self-pay

## 2022-02-28 ENCOUNTER — Emergency Department: Payer: Medicare PPO

## 2022-02-28 ENCOUNTER — Encounter: Payer: Self-pay | Admitting: Emergency Medicine

## 2022-02-28 DIAGNOSIS — R1030 Lower abdominal pain, unspecified: Secondary | ICD-10-CM

## 2022-02-28 LAB — URINALYSIS, ROUTINE W REFLEX MICROSCOPIC
Bilirubin Urine: NEGATIVE
Glucose, UA: NEGATIVE mg/dL
Hgb urine dipstick: NEGATIVE
Ketones, ur: NEGATIVE mg/dL
Nitrite: NEGATIVE
Protein, ur: NEGATIVE mg/dL
Specific Gravity, Urine: 1.001 — ABNORMAL LOW (ref 1.005–1.030)
pH: 6 (ref 5.0–8.0)

## 2022-02-28 LAB — CBC WITH DIFFERENTIAL/PLATELET
Abs Immature Granulocytes: 0.02 10*3/uL (ref 0.00–0.07)
Basophils Absolute: 0.1 10*3/uL (ref 0.0–0.1)
Basophils Relative: 1 %
Eosinophils Absolute: 0.1 10*3/uL (ref 0.0–0.5)
Eosinophils Relative: 2 %
HCT: 40.2 % (ref 36.0–46.0)
Hemoglobin: 13.7 g/dL (ref 12.0–15.0)
Immature Granulocytes: 0 %
Lymphocytes Relative: 27 %
Lymphs Abs: 1.2 10*3/uL (ref 0.7–4.0)
MCH: 29.3 pg (ref 26.0–34.0)
MCHC: 34.1 g/dL (ref 30.0–36.0)
MCV: 85.9 fL (ref 80.0–100.0)
Monocytes Absolute: 0.6 10*3/uL (ref 0.1–1.0)
Monocytes Relative: 13 %
Neutro Abs: 2.5 10*3/uL (ref 1.7–7.7)
Neutrophils Relative %: 57 %
Platelets: 179 10*3/uL (ref 150–400)
RBC: 4.68 MIL/uL (ref 3.87–5.11)
RDW: 12.9 % (ref 11.5–15.5)
WBC: 4.5 10*3/uL (ref 4.0–10.5)
nRBC: 0 % (ref 0.0–0.2)

## 2022-02-28 LAB — LIPASE, BLOOD: Lipase: 32 U/L (ref 11–51)

## 2022-02-28 LAB — COMPREHENSIVE METABOLIC PANEL
ALT: 16 U/L (ref 0–44)
AST: 31 U/L (ref 15–41)
Albumin: 4.2 g/dL (ref 3.5–5.0)
Alkaline Phosphatase: 63 U/L (ref 38–126)
Anion gap: 8 (ref 5–15)
BUN: 7 mg/dL — ABNORMAL LOW (ref 8–23)
CO2: 23 mmol/L (ref 22–32)
Calcium: 9.3 mg/dL (ref 8.9–10.3)
Chloride: 106 mmol/L (ref 98–111)
Creatinine, Ser: 0.8 mg/dL (ref 0.44–1.00)
GFR, Estimated: >60 mL/min (ref >60)
Glucose, Bld: 101 mg/dL — ABNORMAL HIGH (ref 70–99)
Potassium: 3.4 mmol/L — ABNORMAL LOW (ref 3.5–5.1)
Sodium: 137 mmol/L (ref 135–145)
Total Bilirubin: 0.7 mg/dL (ref 0.3–1.2)
Total Protein: 7.1 g/dL (ref 6.5–8.1)

## 2022-02-28 MED ORDER — DICYCLOMINE HCL 20 MG PO TABS
20.0000 mg | ORAL_TABLET | Freq: Once | ORAL | Status: AC
  Administered 2022-02-28: 20 mg via ORAL
  Filled 2022-02-28: qty 1

## 2022-02-28 MED ORDER — TRAMADOL HCL 50 MG PO TABS: 50.0000 mg | ORAL_TABLET | Freq: Four times a day (QID) | ORAL | 0 refills | Status: DC | PRN

## 2022-02-28 MED ORDER — IOHEXOL 350 MG/ML SOLN
75.0000 mL | Freq: Once | INTRAVENOUS | Status: AC | PRN
  Administered 2022-02-28: 75 mL via INTRAVENOUS

## 2022-02-28 MED ORDER — DICYCLOMINE HCL 20 MG PO TABS: 20.0000 mg | ORAL_TABLET | Freq: Four times a day (QID) | ORAL | 0 refills | Status: DC

## 2022-02-28 MED ORDER — FENTANYL CITRATE PF 50 MCG/ML IJ SOSY
50.0000 ug | PREFILLED_SYRINGE | Freq: Once | INTRAMUSCULAR | Status: AC
  Administered 2022-02-28: 50 ug via INTRAVENOUS
  Filled 2022-02-28: qty 1

## 2022-02-28 MED ORDER — ONDANSETRON HCL 4 MG/2ML IJ SOLN
4.0000 mg | Freq: Once | INTRAMUSCULAR | Status: AC
  Administered 2022-02-28: 4 mg via INTRAVENOUS
  Filled 2022-02-28: qty 2

## 2022-02-28 MED ORDER — SODIUM CHLORIDE 0.9 % IV BOLUS
1000.0000 mL | Freq: Once | INTRAVENOUS | Status: AC
  Administered 2022-02-28: 1000 mL via INTRAVENOUS

## 2022-02-28 NOTE — Discharge Instructions (Signed)
Continue and finish antibiotics as prescribed by your doctor.  You may take Bentyl as needed for abdominal discomfort.  You may take 1/2 to 1 Tramadol tablet as needed for more severe pain.  Bland diet x1 week, then slowly advance diet as tolerated.  Return to the ER for worsening symptoms, persistent vomiting, difficulty breathing or other concerns.

## 2022-02-28 NOTE — ED Notes (Signed)
ED Provider at bedside. 

## 2022-02-28 NOTE — ED Notes (Signed)
Patient transported back from CT 

## 2022-02-28 NOTE — ED Provider Notes (Signed)
[facility] Provider Note    Event Date/Time   First MD Initiated Contact with Patient 02/28/22 0141     (approximate)   History   Abdominal Pain   HPI  Alison Bradshaw is a 81 y.o. female brought to the ED via EMS from [facility] independent living with a chief complaint of lower abdominal pain.  Symptoms onset 2 days ago.  Seen by PCP who prescribed Flagyl/Cipro for presumed diverticulitis.  Patient has not had a history of such previously.  Symptoms associated with nausea.  Denies fever, chills, chest pain, shortness of breath, dysuria or diarrhea.     Past Medical History   Past Medical History:  Diagnosis Date   Anxiety    Asthma    Atypical chest pain    Barrett's esophagus    Chronic gastritis    Diverticulosis    Duodenal ulcer    Gastritis    GERD (gastroesophageal reflux disease)    Hiatal hernia    History of echocardiogram    a. 08/2020 Echo: EF 60-65%, no rwma, Nl RV fxn, mild MR.   Hyperlipidemia    Non-obstructive CAD (coronary artery disease)    a. 10/2017 Stress Echo: Nl EF, no ischemia. Mild MR; b. 12/2017 Cardiac CTA: LM nl, LAD 50p, 30d, D1/2 nl, RI nl, LCX nl, OM1/2 nl, RCA nl, RPDA/RPL nl. Ca2+ score = 39 (46th percentile)-->statin dose escalated; c. 10/2021 MV: No ischemia/infarct. Nl LV fxn.   Osteopenia    PSVT (paroxysmal supraventricular tachycardia)    a. 10/2017 Holter: PSVT, max 5 beats->did not tolerate beta blocker; b. 09/2020 Zio: 38 runs of SVT. Longest 18.1 secs (106), fastest 203 bpm (6 beats). Occas PVCs (1.1%) - some assoc w/ triggered events.   Pulmonary nodules    a. 12/2017 RLL 31m nodule noted on Cardiac CTA; b. 04/2018 RLL 951mnodule resolved; c. 09/2018 CT Chest: Previously noted posterior RUL nodules almost completely resolved. Bandlike scarring of RML and lingula-->consistent w/ improved atypical infxt.   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/27/2019   Alopecia 05/14/2018   Lung nodule 03/30/2018   Postmenopausal 01/05/2018   Nausea 10/08/2016   Need for vaccination 12/28/2015   Prediabetes 12/21/2015   Pain in right hip 12/21/2015   Trochanteric bursitis of right hip 12/21/2015   GAD (generalized anxiety disorder) 09/29/2015   Hematuria, microscopic 09/29/2015   Onychomycosis of toenail 09/29/2015   Anxiety 09/21/2014   Osteopenia 09/21/2014   Reactive airway disease, mild intermittent, uncomplicated 1274/03/8785 Mixed hyperlipidemia 02/22/2014   Abnormal blood chemistry 12/25/2012   Abnormal chest CT 12/25/2012   Senile nuclear sclerosis 12/14/2012   Flushing reaction 11/19/2012   Gastroesophageal reflux disease 11/19/2012   Rosacea 11/19/2012   Peptic ulcer, site unspecified, unspecified as acute or chronic, without hemorrhage or perforation 11/19/2012   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   Multiple gastric polyps 11/02/2012     Past Surgical History   Past Surgical History:  Procedure Laterality Date   BREAST BIOPSY Left 2008   neg   BREAST EXCISIONAL BIOPSY Left 2004   neg  CATARACT EXTRACTION W/ INTRAOCULAR LENS  IMPLANT, BILATERAL Bilateral    COLONOSCOPY WITH PROPOFOL N/A 01/22/2017   Procedure: COLONOSCOPY WITH PROPOFOL;  Surgeon: Toledo, Benay Pike, MD;  Location: [facility] ENDOSCOPY;  Service: Gastroenterology;  Laterality: N/A;   ESOPHAGOGASTRODUODENOSCOPY (EGD) WITH PROPOFOL N/A 01/20/2015   Procedure: ESOPHAGOGASTRODUODENOSCOPY (EGD) WITH PROPOFOL;  Surgeon: Lollie Sails, MD;  Location: [facility] ENDOSCOPY;  Service: Endoscopy;  Laterality: N/A;   ESOPHAGOGASTRODUODENOSCOPY  (EGD) WITH PROPOFOL N/A 01/22/2017   Procedure: ESOPHAGOGASTRODUODENOSCOPY (EGD) WITH PROPOFOL;  Surgeon: Toledo, Benay Pike, MD;  Location: [facility] ENDOSCOPY;  Service: Gastroenterology;  Laterality: N/A;   ESOPHAGOGASTRODUODENOSCOPY (EGD) WITH PROPOFOL N/A 04/14/2018   Procedure: ESOPHAGOGASTRODUODENOSCOPY (EGD) WITH PROPOFOL;  Surgeon: Lollie Sails, MD;  Location: [facility] ENDOSCOPY;  Service: Endoscopy;  Laterality: N/A;   ESOPHAGOGASTRODUODENOSCOPY (EGD) WITH PROPOFOL N/A 10/16/2021   Procedure: ESOPHAGOGASTRODUODENOSCOPY (EGD) WITH PROPOFOL;  Surgeon: Lesly Rubenstein, MD;  Location: [facility] ENDOSCOPY;  Service: Endoscopy;  Laterality: N/A;   EYE SURGERY Right    laser scraping   OPEN REDUCTION INTERNAL FIXATION (ORIF) DISTAL RADIAL FRACTURE Right 02/09/2018   Procedure: OPEN REDUCTION INTERNAL FIXATION (ORIF) DISTAL RADIAL FRACTURE;  Surgeon: Hessie Knows, MD;  Location: [facility] ORS;  Service: Orthopedics;  Laterality: Right;   TONSILLECTOMY       Home Medications   Prior to Admission medications   Medication Sig Start Date End Date Taking? Authorizing Provider  acetaminophen (TYLENOL) 500 MG tablet Take 1,000 mg by mouth every 6 (six) hours as needed.    [provider]  albuterol (PROVENTIL HFA;VENTOLIN HFA) 108 (90 Base) MCG/ACT inhaler Inhale 2 puffs into the lungs every 6 (six) hours as needed for shortness of breath. 05/15/18   Wilhelmina Mcardle, MD  ALPRAZolam Duanne Moron) 0.25 MG tablet Take 0.25 mg by mouth 2 (two) times daily as needed for anxiety.    [provider]  azelastine (ASTELIN) 0.1 % nasal spray Place 1 spray into the nose 2 (two) times daily as needed. 11/18/19   [provider]  fluticasone (FLONASE) 50 MCG/ACT nasal spray Place 2 sprays into both nostrils daily. 01/30/22 03/01/22  Dewayne Shorter, MD  Fluticasone-Salmeterol (AIRDUO RESPICLICK 161/09) 604-54 MCG/ACT AEPB Inhale 1 puff into the lungs 2 (two) times daily. 11/14/17   Wilhelmina Mcardle, MD   pantoprazole (PROTONIX) 40 MG tablet Take 40 mg by mouth daily.    [provider]  rosuvastatin (CRESTOR) 10 MG tablet Take 10 mg by mouth daily. 11/27/18   [provider]  sucralfate (CARAFATE) 1 G tablet Take 1 g by mouth in the morning, at noon, and at bedtime. Pt only takes when she's really needs it which isn't very often    [provider]     Allergies  Percocet [oxycodone-acetaminophen], Ceftin [cefuroxime axetil], and Timentin [ticarcillin-pot clavulanate]   Family History   Family History  Problem Relation Age of Onset   Hyperlipidemia Mother    Alzheimer's disease Mother    Emphysema Father    AAA (abdominal aortic aneurysm) Father    Kidney Stones Father    Kidney cancer Brother    Pancreatic cancer Paternal Uncle    Diabetes Paternal Uncle    Stomach cancer Neg Hx    Colon cancer Neg Hx    Esophageal cancer Neg Hx      Physical Exam  Triage Vital Signs: ED Triage Vitals  Enc Vitals Group     BP 02/28/22 0001 (!) 145/71     Pulse Rate 02/28/22 0001 64  Resp 02/28/22 0001 18     Temp 02/28/22 0001 97.7 F (36.5 C)     Temp Source 02/28/22 0001 Oral     SpO2 02/28/22 0001 100 %     Weight 02/28/22 0000 140 lb (63.5 kg)     Height 02/28/22 0000 '5\' 4"'$  (1.626 m)     Head Circumference --      Peak Flow --      Pain Score 02/28/22 0000 7     Pain Loc --      Pain Edu? --      Excl. in [place]? --     Updated Vital Signs: BP (!) 113/47   Pulse 74   Temp (!) 97.5 F (36.4 C)   Resp 20   Ht '5\' 4"'$  (1.626 m)   Wt 63.5 kg   SpO2 100%   BMI 24.03 kg/m    General: Awake, mild distress.  CV:  Good peripheral perfusion.  Resp:  Normal effort.  Abd:  Mildly tender to palpation all lower quadrants without rebound or guarding.  No distention.  Other:  No truncal vesicles.   ED Results / Procedures / Treatments  Labs (all labs ordered are listed, but only abnormal results are displayed) Labs Reviewed  COMPREHENSIVE METABOLIC  PANEL - Abnormal; Notable for the following components:      Result Value   Potassium 3.4 (*)    Glucose, Bld 101 (*)    BUN 7 (*)    All other components within normal limits  URINALYSIS, ROUTINE W REFLEX MICROSCOPIC - Abnormal; Notable for the following components:   Color, Urine COLORLESS (*)    APPearance CLEAR (*)    Specific Gravity, Urine 1.001 (*)    Leukocytes,Ua TRACE (*)    Bacteria, UA RARE (*)    All other components within normal limits  CBC WITH DIFFERENTIAL/PLATELET  LIPASE, BLOOD     EKG  None   RADIOLOGY I have independently visualized and interpreted patient's CT scan as well as noted the radiology interpretation:  CT abdomen pelvis: Unremarkable  Official radiology report(s): CT Abdomen Pelvis W Contrast  Result Date: 02/28/2022 CLINICAL DATA:  81 year old female with left lower quadrant pain for 3 days. EXAM: CT ABDOMEN AND PELVIS WITH CONTRAST TECHNIQUE: Multidetector CT imaging of the abdomen and pelvis was performed using the standard protocol following bolus administration of intravenous contrast. RADIATION DOSE REDUCTION: This exam was performed according to the departmental dose-optimization program which includes automated exposure control, adjustment of the mA and/or kV according to patient size and/or use of iterative reconstruction technique. CONTRAST:  40m OMNIPAQUE IOHEXOL 350 MG/ML SOLN COMPARISON:  CT Abdomen and Pelvis 09/19/2021 and earlier. FINDINGS: Lower chest: Respiratory motion today, otherwise negative. Heart size is *CRASH * that visible heart size appears normal. No pericardial or pleural effusions. Hepatobiliary: Liver and gallbladder are stable since 2017 and negative. Pancreas: Negative. Spleen: Negative. Adrenals/Urinary Tract: Normal adrenal glands. Symmetric renal enhancement and contrast excretion. Renal collecting systems and ureters appear stable since 2017. No nephrolithiasis or pararenal inflammation identified. Chronically  tortuous left ureter, but within normal limits to the ureterovesical junction. Diminutive and unremarkable bladder. Stomach/Bowel: Redundant large bowel in the pelvis with sigmoid diverticulosis. Redundant large bowel in the left lower quadrant, although lesser diverticula. None of the segments appear inflamed. Decompressed splenic flexure. Redundant transverse colon. Some diverticulosis at the hepatic flexure. No large bowel inflammation. Normal appendix suspected on coronal image 28. No dilated small bowel. Gastric ptosis, but the stomach is  decompressed. No free air, free fluid, or mesenteric inflammation identified. Vascular/Lymphatic: Aortoiliac calcified atherosclerosis. Major arterial structures remain patent. Portal venous system is patent. No lymphadenopathy identified. Reproductive: Within normal limits. Other: No pelvic free fluid. Chronic right hemipelvis phlebolith is stable. Musculoskeletal: Chronic lower lumbar facet arthropathy. No acute osseous abnormality identified. IMPRESSION: 1. No acute or inflammatory process identified in the abdomen or pelvis. Redundant large bowel with intermittent diverticulosis, but no convincing bowel inflammation. 2.  Aortic Atherosclerosis (ICD10-I70.0). Electronically Signed   By: Genevie Ann M.D.   On: 02/28/2022 04:08     PROCEDURES:  Critical Care performed: No  .1-3 Lead EKG Interpretation  Performed by: Paulette Blanch, MD Authorized by: Paulette Blanch, MD     Interpretation: normal     ECG rate:  65   ECG rate assessment: normal     Rhythm: sinus rhythm     Ectopy: none     Conduction: normal   Comments:     Patient placed on cardiac monitor to evaluate for arrhythmias    MEDICATIONS ORDERED IN ED: Medications  dicyclomine (BENTYL) tablet 20 mg (has no administration in time range)  sodium chloride 0.9 % bolus 1,000 mL (0 mLs Intravenous Stopped 02/28/22 0301)  ondansetron (ZOFRAN) injection 4 mg (4 mg Intravenous Given 02/28/22 0209)  fentaNYL  (SUBLIMAZE) injection 50 mcg (50 mcg Intravenous Given 02/28/22 0209)  iohexol (OMNIPAQUE) 350 MG/ML injection 75 mL (75 mLs Intravenous Contrast Given 02/28/22 0340)  fentaNYL (SUBLIMAZE) injection 50 mcg (50 mcg Intravenous Given 02/28/22 0358)     IMPRESSION / MDM / ASSESSMENT AND PLAN / ED COURSE  I reviewed the triage vital signs and the nursing notes.                             81 year old female presenting with lower abdominal pain. Differential diagnosis includes, but is not limited to, ovarian cyst, ovarian torsion, acute appendicitis, diverticulitis, urinary tract infection/pyelonephritis, endometriosis, bowel obstruction, colitis, renal colic, gastroenteritis, hernia, etc. I have personally reviewed patient's records and note her PCP office visit on 02/26/2022 for diverticulitis as well as a telephone communication with her gastroenterologist yesterday who recommended ED evaluation for worsening symptoms.  Patient's presentation is most consistent with acute presentation with potential threat to life or bodily function.  The patient is on the cardiac monitor to evaluate for evidence of arrhythmia and/or significant heart rate changes.  Laboratory results demonstrate normal WBC 4.5, normal electrolytes, trace leukocytes with 0-5 WBC.  Will administer IV fluids, IV fentanyl for pain paired with IV Zofran to prevent nausea.  Will obtain CT abdomen/pelvis to evaluate perforated diverticulitis/abscess.  Clinical Course as of 02/28/22 0432  Thu Feb 28, 2022  0424 Updated patient on unremarkable CT scan.  Will discharge home with a prescription for Bentyl and tramadol to use as needed.  Patient already has Zofran prescribed for her by her PCP.  Advised her to go ahead and finish out her Cipro/Flagyl prescription and to follow-up closely with her PCP.  Strict return precautions given.  Patient verbalizes understanding and agrees with plan of care. [JS]    Clinical Course User Index [JS]  Paulette Blanch, MD     FINAL CLINICAL IMPRESSION(S) / ED DIAGNOSES   Final diagnoses:  Lower abdominal pain     Rx / DC Orders   ED Discharge Orders     None        Note:  This  document was prepared using Systems analyst and may include unintentional dictation errors.   Paulette Blanch, MD 02/28/22 [phone]

## 2022-02-28 NOTE — ED Triage Notes (Signed)
EMS brings pt in from [facility] independent; c/o lower abd pain since Monday night accomp by nausea; denies hx of same

## 2022-02-28 NOTE — ED Notes (Signed)
Patient transported to CT 

## 2022-02-28 NOTE — ED Notes (Signed)
Signing pad did not work. Pt verbalized understanding of DC instructions.

## 2022-02-28 NOTE — ED Notes (Signed)
Pt brought to ED rm 24 at this time, this RN now assuming care.

## 2022-03-04 ENCOUNTER — Ambulatory Visit
Admission: EM | Admit: 2022-03-04 | Discharge: 2022-03-04 | Disposition: A | Discharge status: Home / Self Care | Payer: Medicare PPO | Attending: Emergency Medicine | Admitting: Emergency Medicine

## 2022-03-04 ENCOUNTER — Encounter: Payer: Self-pay | Admitting: Emergency Medicine

## 2022-03-04 ENCOUNTER — Other Ambulatory Visit: Payer: Self-pay

## 2022-03-04 ENCOUNTER — Encounter: Payer: Self-pay | Admitting: *Deleted

## 2022-03-04 DIAGNOSIS — R1030 Lower abdominal pain, unspecified: Secondary | ICD-10-CM | POA: Insufficient documentation

## 2022-03-04 DIAGNOSIS — K219 Gastro-esophageal reflux disease without esophagitis: Secondary | ICD-10-CM | POA: Diagnosis not present

## 2022-03-04 DIAGNOSIS — R140 Abdominal distension (gaseous): Secondary | ICD-10-CM | POA: Insufficient documentation

## 2022-03-04 DIAGNOSIS — I2510 Atherosclerotic heart disease of native coronary artery without angina pectoris: Secondary | ICD-10-CM | POA: Diagnosis not present

## 2022-03-04 DIAGNOSIS — R1084 Generalized abdominal pain: Secondary | ICD-10-CM

## 2022-03-04 DIAGNOSIS — J45909 Unspecified asthma, uncomplicated: Secondary | ICD-10-CM | POA: Insufficient documentation

## 2022-03-04 DIAGNOSIS — R4182 Altered mental status, unspecified: Secondary | ICD-10-CM

## 2022-03-04 LAB — POCT URINALYSIS DIP (MANUAL ENTRY)
Bilirubin, UA: NEGATIVE
Glucose, UA: NEGATIVE mg/dL
Ketones, POC UA: NEGATIVE mg/dL
Leukocytes, UA: NEGATIVE
Nitrite, UA: NEGATIVE
Protein Ur, POC: NEGATIVE mg/dL
Spec Grav, UA: 1.01 (ref 1.010–1.025)
Urobilinogen, UA: 0.2 E.U./dL
pH, UA: 7 (ref 5.0–8.0)

## 2022-03-04 NOTE — Discharge Instructions (Signed)
Go to the emergency department for evaluation.     

## 2022-03-04 NOTE — ED Triage Notes (Signed)
Left abdominal pain started last Monday.  Saw pcp .  Was diagnosed with diverticulitis.  Went to ED on Wednesday.  Blood work and radiology studies done.  Was reported to have a uti.  Was told to continue antibiotic and did feel better over the weekend.    Today, pain in left abdomen has reoccurred. Daughter reports a decrease in mental sharpness for patient, or slowness in thinking. Also a new complaint is feel a great deal of pressure to right abdomen.    Last BM this morning and it was "darker than usual".  Denies stool being hard.  Reports anal itching and burning and this has continued to vaginal/labial discomfort thought to be a yeast infection secondary to antibiotic

## 2022-03-04 NOTE — ED Provider Notes (Signed)
Roderic Palau    CSN: [id number] Arrival date & time: 03/04/22  1548      History   Chief Complaint Chief Complaint  Patient presents with   Urinary Tract Infection    HPI Alison Bradshaw is a 81 y.o. female.  Accompanied by her daughter-in-law, patient presents with ongoing lower abdominal pain x 1 week.  The pain started in her left lower abdomen but has progressed to her entire lower and mid abdomen; it has gotten worse.  It feels like "pressure."  She also has decreased mental sharpness per her daughter-in-law; patient states she feels "foggy" in her thinking.  She also has darker than normal stool and vaginal irritation.  No focal weakness, chest pain, shortness of breath, fever, vomiting, diarrhea, flank pain, hematuria, or other symptoms.  She contacted her GI today and was instructed to come to UC to have her urine checked.  Patient was seen at [facility] ED on 02/28/2022; diagnosed with lower abdominal pain.  She was seen by her PCP on 02/26/2022; diagnosed with diverticulitis; treated with Cipro and Flagyl.  The history is provided by the patient, a relative and medical records.    Past Medical History:  Diagnosis Date   Anxiety    Asthma    Atypical chest pain    Barrett's esophagus    Chronic gastritis    Diverticulosis    Duodenal ulcer    Gastritis    GERD (gastroesophageal reflux disease)    Hiatal hernia    History of echocardiogram    a. 08/2020 Echo: EF 60-65%, no rwma, Nl RV fxn, mild MR.   Hyperlipidemia    Non-obstructive CAD (coronary artery disease)    a. 10/2017 Stress Echo: Nl EF, no ischemia. Mild MR; b. 12/2017 Cardiac CTA: LM nl, LAD 50p, 30d, D1/2 nl, RI nl, LCX nl, OM1/2 nl, RCA nl, RPDA/RPL nl. Ca2+ score = 39 (46th percentile)-->statin dose escalated; c. 10/2021 MV: No ischemia/infarct. Nl LV fxn.   Osteopenia    PSVT (paroxysmal supraventricular tachycardia)    a. 10/2017 Holter: PSVT, max 5 beats->did not tolerate beta blocker; b. 09/2020  Zio: 38 runs of SVT. Longest 18.1 secs (106), fastest 203 bpm (6 beats). Occas PVCs (1.1%) - some assoc w/ triggered events.   Pulmonary nodules    a. 12/2017 RLL 20m nodule noted on Cardiac CTA; b. 04/2018 RLL 981mnodule resolved; c. 09/2018 CT Chest: Previously noted posterior RUL nodules almost completely resolved. Bandlike scarring of RML and lingula-->consistent w/ improved atypical infxt.   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   Zenker's diverticulum - asymptomatic 03/27/2019   Functional dyspepsia 03/27/2019   Irritable bowel syndrome with diarrhea 03/27/2019   Alopecia 05/14/2018   Lung nodule 03/30/2018   Postmenopausal 01/05/2018   Nausea 10/08/2016   Need for vaccination 12/28/2015   Prediabetes 12/21/2015   Pain in right hip 12/21/2015   Trochanteric bursitis of right hip 12/21/2015   GAD (generalized anxiety disorder) 09/29/2015   Hematuria, microscopic 09/29/2015   Onychomycosis of toenail 09/29/2015   Anxiety 09/21/2014   Osteopenia 09/21/2014   Reactive airway disease, mild intermittent, uncomplicated 1253/74/8270 Mixed hyperlipidemia 02/22/2014   Abnormal blood chemistry 12/25/2012   Abnormal chest CT 12/25/2012   Senile nuclear sclerosis 12/14/2012  Flushing reaction 11/19/2012   Gastroesophageal reflux disease 11/19/2012   Rosacea 11/19/2012   Peptic ulcer, site unspecified, unspecified as acute or chronic, without hemorrhage or perforation 11/19/2012   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   Multiple gastric polyps 11/02/2012    Past Surgical History:  Procedure Laterality Date   BREAST BIOPSY Left 2008   neg   BREAST EXCISIONAL BIOPSY Left 2004   neg   CATARACT EXTRACTION W/ INTRAOCULAR LENS  IMPLANT, BILATERAL Bilateral     COLONOSCOPY WITH PROPOFOL N/A 01/22/2017   Procedure: COLONOSCOPY WITH PROPOFOL;  Surgeon: Toledo, Benay Pike, MD;  Location: [facility] ENDOSCOPY;  Service: Gastroenterology;  Laterality: N/A;   ESOPHAGOGASTRODUODENOSCOPY (EGD) WITH PROPOFOL N/A 01/20/2015   Procedure: ESOPHAGOGASTRODUODENOSCOPY (EGD) WITH PROPOFOL;  Surgeon: Lollie Sails, MD;  Location: [facility] ENDOSCOPY;  Service: Endoscopy;  Laterality: N/A;   ESOPHAGOGASTRODUODENOSCOPY (EGD) WITH PROPOFOL N/A 01/22/2017   Procedure: ESOPHAGOGASTRODUODENOSCOPY (EGD) WITH PROPOFOL;  Surgeon: Toledo, Benay Pike, MD;  Location: [facility] ENDOSCOPY;  Service: Gastroenterology;  Laterality: N/A;   ESOPHAGOGASTRODUODENOSCOPY (EGD) WITH PROPOFOL N/A 04/14/2018   Procedure: ESOPHAGOGASTRODUODENOSCOPY (EGD) WITH PROPOFOL;  Surgeon: Lollie Sails, MD;  Location: [facility] ENDOSCOPY;  Service: Endoscopy;  Laterality: N/A;   ESOPHAGOGASTRODUODENOSCOPY (EGD) WITH PROPOFOL N/A 10/16/2021   Procedure: ESOPHAGOGASTRODUODENOSCOPY (EGD) WITH PROPOFOL;  Surgeon: Lesly Rubenstein, MD;  Location: [facility] ENDOSCOPY;  Service: Endoscopy;  Laterality: N/A;   EYE SURGERY Right    laser scraping   OPEN REDUCTION INTERNAL FIXATION (ORIF) DISTAL RADIAL FRACTURE Right 02/09/2018   Procedure: OPEN REDUCTION INTERNAL FIXATION (ORIF) DISTAL RADIAL FRACTURE;  Surgeon: Hessie Knows, MD;  Location: [facility] ORS;  Service: Orthopedics;  Laterality: Right;   TONSILLECTOMY      OB History   No obstetric history on file.      Home Medications    Prior to Admission medications   Medication Sig Start Date End Date Taking? Authorizing Provider  acetaminophen (TYLENOL) 500 MG tablet Take 1,000 mg by mouth every 6 (six) hours as needed.    [provider]  albuterol (PROVENTIL HFA;VENTOLIN HFA) 108 (90 Base) MCG/ACT inhaler Inhale 2 puffs into the lungs every 6 (six) hours as needed for shortness of breath. 05/15/18   Wilhelmina Mcardle, MD  ALPRAZolam Duanne Moron) 0.25 MG tablet Take 0.25  mg by mouth 2 (two) times daily as needed for anxiety.    [provider]  azelastine (ASTELIN) 0.1 % nasal spray Place 1 spray into the nose 2 (two) times daily as needed. 11/18/19   [provider]  dicyclomine (BENTYL) 20 MG tablet Take 1 tablet (20 mg total) by mouth every 6 (six) hours. 02/28/22   Paulette Blanch, MD  fluticasone (FLONASE) 50 MCG/ACT nasal spray Place 2 sprays into both nostrils daily. 01/30/22 03/01/22  Dewayne Shorter, MD  Fluticasone-Salmeterol (AIRDUO RESPICLICK 308/65) 784-69 MCG/ACT AEPB Inhale 1 puff into the lungs 2 (two) times daily. 11/14/17   Wilhelmina Mcardle, MD  pantoprazole (PROTONIX) 40 MG tablet Take 40 mg by mouth daily.    [provider]  rosuvastatin (CRESTOR) 10 MG tablet Take 10 mg by mouth daily. 11/27/18   [provider]  sucralfate (CARAFATE) 1 G tablet Take 1 g by mouth in the morning, at noon, and at bedtime. Pt only takes when she's really needs it which isn't very often    [provider]  traMADol (ULTRAM) 50 MG tablet Take 1 tablet (50 mg total) by mouth every 6 (six) hours as needed. 02/28/22  Paulette Blanch, MD    Family History Family History  Problem Relation Age of Onset   Hyperlipidemia Mother    Alzheimer's disease Mother    Emphysema Father    AAA (abdominal aortic aneurysm) Father    Kidney Stones Father    Kidney cancer Brother    Pancreatic cancer Paternal Uncle    Diabetes Paternal Uncle    Stomach cancer Neg Hx    Colon cancer Neg Hx    Esophageal cancer Neg Hx     Social History Social History   Tobacco Use   Smoking status: Former    Types: Cigarettes   Smokeless tobacco: Never   Tobacco comments:    Quit at age 80, smoked 1 pack a month   Vaping Use   Vaping Use: Never used  Substance Use Topics   Alcohol use: Yes    Alcohol/week: 2.0 standard drinks of alcohol    Types: 2 Glasses of wine per week    Comment: weekly,none last 24 hrs   Drug use: No     Allergies    Percocet [oxycodone-acetaminophen], Ceftin [cefuroxime axetil], and Timentin [ticarcillin-pot clavulanate]   Review of Systems Review of Systems  Constitutional:  Negative for chills and fever.  Respiratory:  Negative for cough and shortness of breath.   Cardiovascular:  Negative for chest pain and palpitations.  Gastrointestinal:  Positive for abdominal pain. Negative for diarrhea, nausea and vomiting.  Genitourinary:  Positive for dysuria, frequency and urgency. Negative for flank pain, hematuria, pelvic pain and vaginal discharge.  Neurological:  Negative for weakness and numbness.       Decreased mental sharpness, "foggy" thinking  All other systems reviewed and are negative.    Physical Exam Triage Vital Signs ED Triage Vitals  Enc Vitals Group     BP      Pulse      Resp      Temp      Temp src      SpO2      Weight      Height      Head Circumference      Peak Flow      Pain Score      Pain Loc      Pain Edu?      Excl. in [place]?    No data found.  Updated Vital Signs BP (!) 149/76 (BP Location: Right Arm)   Pulse 69   Temp 97.9 F (36.6 C)   Resp 18   SpO2 99%   Visual Acuity Right Eye Distance:   Left Eye Distance:   Bilateral Distance:    Right Eye Near:   Left Eye Near:    Bilateral Near:     Physical Exam Vitals and nursing note reviewed.  Constitutional:      General: She is not in acute distress.    Appearance: She is well-developed. She is ill-appearing.  HENT:     Mouth/Throat:     Mouth: Mucous membranes are moist.  Cardiovascular:     Rate and Rhythm: Normal rate and regular rhythm.     Heart sounds: Normal heart sounds.  Pulmonary:     Effort: Pulmonary effort is normal. No respiratory distress.     Breath sounds: Normal breath sounds.  Abdominal:     General: Bowel sounds are normal.     Palpations: Abdomen is soft.     Tenderness: There is abdominal tenderness. There is no right CVA tenderness, left CVA tenderness,  guarding or  rebound.     Comments: Generalized tenderness to palpation of abdomen, worse in lower abdomen.    Musculoskeletal:     Cervical back: Neck supple.  Skin:    General: Skin is warm and dry.  Neurological:     General: No focal deficit present.     Mental Status: She is alert and oriented to person, place, and time.     Sensory: No sensory deficit.     Motor: No weakness.     Gait: Gait normal.  Psychiatric:        Mood and Affect: Mood normal.        Behavior: Behavior normal.      UC Treatments / Results  Labs (all labs ordered are listed, but only abnormal results are displayed) Labs Reviewed  POCT URINALYSIS DIP (MANUAL ENTRY) - Abnormal; Notable for the following components:      Result Value   Blood, UA trace-lysed (*)    All other components within normal limits    EKG   Radiology No results found.  Procedures Procedures (including critical care time)  Medications Ordered in UC Medications - No data to display  Initial Impression / Assessment and Plan / UC Course  I have reviewed the triage vital signs and the nursing notes.  Pertinent labs & imaging results that were available during my care of the patient were reviewed by me and considered in my medical decision making (see chart for details).   Generalized abdominal pain, altered mental status.  Patient is ill-appearing and her abdominal pain is getting worse despite treatment with Cipro and Flagyl.  She reports feeling mentally foggy and her daughter-in-law states she is as sharp mentally as she usually is.  Discussed limitations of evaluation of her symptoms in an urgent care setting.  Sending her to the ED.  Her daughter-in-law will transport her there.  VSS.   Final Clinical Impressions(s) / UC Diagnoses   Final diagnoses:  Generalized abdominal pain  Altered mental status, unspecified altered mental status type     Discharge Instructions      Go to the emergency department for evaluation.        ED Prescriptions   None    PDMP not reviewed this encounter.   Sharion Balloon, NP 03/04/22 1721

## 2022-03-04 NOTE — ED Notes (Signed)
Patient is being discharged from the Urgent Care and sent to the Emergency Department via POV . Per Barkley Boards, NP, patient is in need of higher level of care due to limited resources. Patient is aware and verbalizes understanding of plan of care.  Vitals:   03/04/22 1614 03/04/22 1634  BP:  (!) 149/76  Pulse: 69   Resp: 18   Temp: 97.9 F (36.6 C)   SpO2: 99%

## 2022-03-04 NOTE — ED Triage Notes (Signed)
Pt with abd pain for 1 week.  Pt was seen here recently for similar sx.  Pt  has v/d  pt alert  speech clear.

## 2022-03-05 ENCOUNTER — Emergency Department: Payer: Medicare PPO

## 2022-03-05 ENCOUNTER — Emergency Department
Admission: EM | Admit: 2022-03-05 | Discharge: 2022-03-05 | Disposition: A | Discharge status: Home / Self Care | Payer: Medicare PPO | Attending: Emergency Medicine | Admitting: Emergency Medicine

## 2022-03-05 DIAGNOSIS — R1030 Lower abdominal pain, unspecified: Secondary | ICD-10-CM

## 2022-03-05 LAB — URINALYSIS, ROUTINE W REFLEX MICROSCOPIC
Bilirubin Urine: NEGATIVE
Glucose, UA: NEGATIVE mg/dL
Ketones, ur: 5 mg/dL — AB
Nitrite: NEGATIVE
Protein, ur: NEGATIVE mg/dL
Specific Gravity, Urine: 1.016 (ref 1.005–1.030)
pH: 6 (ref 5.0–8.0)

## 2022-03-05 LAB — CBC WITH DIFFERENTIAL/PLATELET
Abs Immature Granulocytes: 0.03 10*3/uL (ref 0.00–0.07)
Basophils Absolute: 0.1 10*3/uL (ref 0.0–0.1)
Basophils Relative: 1 %
Eosinophils Absolute: 0.1 10*3/uL (ref 0.0–0.5)
Eosinophils Relative: 2 %
HCT: 46.3 % — ABNORMAL HIGH (ref 36.0–46.0)
Hemoglobin: 15.4 g/dL — ABNORMAL HIGH (ref 12.0–15.0)
Immature Granulocytes: 1 %
Lymphocytes Relative: 26 %
Lymphs Abs: 1.7 10*3/uL (ref 0.7–4.0)
MCH: 28.9 pg (ref 26.0–34.0)
MCHC: 33.3 g/dL (ref 30.0–36.0)
MCV: 86.9 fL (ref 80.0–100.0)
Monocytes Absolute: 0.7 10*3/uL (ref 0.1–1.0)
Monocytes Relative: 11 %
Neutro Abs: 4 10*3/uL (ref 1.7–7.7)
Neutrophils Relative %: 59 %
Platelets: 225 10*3/uL (ref 150–400)
RBC: 5.33 MIL/uL — ABNORMAL HIGH (ref 3.87–5.11)
RDW: 13.1 % (ref 11.5–15.5)
WBC: 6.6 10*3/uL (ref 4.0–10.5)
nRBC: 0 % (ref 0.0–0.2)

## 2022-03-05 LAB — COMPREHENSIVE METABOLIC PANEL
ALT: 35 U/L (ref 0–44)
AST: 60 U/L — ABNORMAL HIGH (ref 15–41)
Albumin: 4.9 g/dL (ref 3.5–5.0)
Alkaline Phosphatase: 76 U/L (ref 38–126)
Anion gap: 10 (ref 5–15)
BUN: 12 mg/dL (ref 8–23)
CO2: 27 mmol/L (ref 22–32)
Calcium: 10.4 mg/dL — ABNORMAL HIGH (ref 8.9–10.3)
Chloride: 104 mmol/L (ref 98–111)
Creatinine, Ser: 0.92 mg/dL (ref 0.44–1.00)
GFR, Estimated: >60 mL/min (ref >60)
Glucose, Bld: 100 mg/dL — ABNORMAL HIGH (ref 70–99)
Potassium: 4.4 mmol/L (ref 3.5–5.1)
Sodium: 141 mmol/L (ref 135–145)
Total Bilirubin: 1.1 mg/dL (ref 0.3–1.2)
Total Protein: 8.1 g/dL (ref 6.5–8.1)

## 2022-03-05 LAB — LIPASE, BLOOD: Lipase: 37 U/L (ref 11–51)

## 2022-03-05 MED ORDER — DICYCLOMINE HCL 10 MG PO CAPS: 10.0000 mg | ORAL_CAPSULE | Freq: Three times a day (TID) | ORAL | 0 refills | Status: DC

## 2022-03-05 MED ORDER — MORPHINE SULFATE (PF) 2 MG/ML IV SOLN
2.0000 mg | Freq: Once | INTRAVENOUS | Status: AC
  Administered 2022-03-05: 2 mg via INTRAVENOUS
  Filled 2022-03-05: qty 1

## 2022-03-05 MED ORDER — IOHEXOL 300 MG/ML  SOLN
100.0000 mL | Freq: Once | INTRAMUSCULAR | Status: AC | PRN
  Administered 2022-03-05: 100 mL via INTRAVENOUS

## 2022-03-05 MED ORDER — NITROFURANTOIN MONOHYD MACRO 100 MG PO CAPS: 100.0000 mg | ORAL_CAPSULE | Freq: Two times a day (BID) | ORAL | 0 refills | Status: AC

## 2022-03-05 MED ORDER — LACTATED RINGERS IV BOLUS
1000.0000 mL | Freq: Once | INTRAVENOUS | Status: AC
  Administered 2022-03-05: 1000 mL via INTRAVENOUS

## 2022-03-05 NOTE — ED Provider Notes (Signed)
[facility] Provider Note    Event Date/Time   First MD Initiated Contact with Patient 03/05/22 [phone]     (approximate)   History   Abdominal Pain   HPI  Alison Bradshaw is a 81 y.o. female past medical history of GERD diverticulosis nonobstructive coronary disease who presents with abdominal pain.  Symptoms started on Monday a week ago.  Started as sharp left-sided abdominal pain.  Initially saw her primary care doctor who diagnosed her with likely diverticulitis and started her on Cipro Flagyl.  She did follow-up with Dr. Haig Prophet with GI via telephone who agreed with the plan for antibiotics.  She started having some more generalized bloating and abdominal cramping involving both sides of the abdomen and came to the ED on 11/23.  She had a CT of her abdomen pelvis that did not reveal cause of her pain but was advised to continue the antibiotics.  Was also prescribed tramadol and Bentyl.  Since that time she has had ongoing abdominal pain that is rather constant but does have worsening.  She feels very bloated she has cramping pain both in the left and the right side of the abdomen.  She has had increased number of bowel movements 2 to 3/day no diarrhea or blood in her stool denies fevers or chills denies urinary symptoms does feel pressure on her bladder however.  She did have some left-sided back pain earlier but this is resolved.  No history of similar.  Patient finished Cipro Flagyl today     Past Medical History:  Diagnosis Date   Anxiety    Asthma    Atypical chest pain    Barrett's esophagus    Chronic gastritis    Diverticulosis    Duodenal ulcer    Gastritis    GERD (gastroesophageal reflux disease)    Hiatal hernia    History of echocardiogram    a. 08/2020 Echo: EF 60-65%, no rwma, Nl RV fxn, mild MR.   Hyperlipidemia    Non-obstructive CAD (coronary artery disease)    a. 10/2017 Stress Echo: Nl EF, no ischemia. Mild MR; b. 12/2017 Cardiac CTA:  LM nl, LAD 50p, 30d, D1/2 nl, RI nl, LCX nl, OM1/2 nl, RCA nl, RPDA/RPL nl. Ca2+ score = 39 (46th percentile)-->statin dose escalated; c. 10/2021 MV: No ischemia/infarct. Nl LV fxn.   Osteopenia    PSVT (paroxysmal supraventricular tachycardia)    a. 10/2017 Holter: PSVT, max 5 beats->did not tolerate beta blocker; b. 09/2020 Zio: 38 runs of SVT. Longest 18.1 secs (106), fastest 203 bpm (6 beats). Occas PVCs (1.1%) - some assoc w/ triggered events.   Pulmonary nodules    a. 12/2017 RLL 59m nodule noted on Cardiac CTA; b. 04/2018 RLL 971mnodule resolved; c. 09/2018 CT Chest: Previously noted posterior RUL nodules almost completely resolved. Bandlike scarring of RML and lingula-->consistent w/ improved atypical infxt.   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   Zenker's diverticulum - asymptomatic 03/27/2019   Functional dyspepsia 03/27/2019   Irritable bowel syndrome with diarrhea 03/27/2019   Alopecia 05/14/2018   Lung nodule 03/30/2018   Postmenopausal 01/05/2018   Nausea 10/08/2016   Need for vaccination 12/28/2015   Prediabetes 12/21/2015   Pain in right hip 12/21/2015   Trochanteric  bursitis of right hip 12/21/2015   GAD (generalized anxiety disorder) 09/29/2015   Hematuria, microscopic 09/29/2015   Onychomycosis of toenail 09/29/2015   Anxiety 09/21/2014   Osteopenia 09/21/2014   Reactive airway disease, mild intermittent, uncomplicated 04/16/3233   Mixed hyperlipidemia 02/22/2014   Abnormal blood chemistry 12/25/2012   Abnormal chest CT 12/25/2012   Senile nuclear sclerosis 12/14/2012   Flushing reaction 11/19/2012   Gastroesophageal reflux disease 11/19/2012   Rosacea 11/19/2012   Peptic ulcer, site unspecified, unspecified as acute or chronic,  without hemorrhage or perforation 11/19/2012   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 --      Excl. in [place]? --     Most recent vital signs: Vitals:   03/04/22 1912 03/05/22 0406  BP: (!) 155/70 (!) 143/65  Pulse: (!) 54 67  Resp: 20 18  Temp: 98 F (36.7 C)   SpO2: 100% 100%     General: Awake, no distress.  CV:  Good peripheral perfusion.  Resp:  Normal effort.  Abd:  No distention.  Mild tenderness in bilateral lower quadrants and suprapubic region but abdomen soft no guarding Neuro:             Awake, Alert, Oriented x 3  Other:     ED Results / Procedures / Treatments  Labs (all labs ordered are listed, but only abnormal results are displayed) Labs Reviewed  CBC WITH DIFFERENTIAL/PLATELET - Abnormal; Notable for the following components:      Result Value   RBC 5.33 (*)    Hemoglobin 15.4 (*)    HCT 46.3 (*)    All other components within normal limits  COMPREHENSIVE METABOLIC PANEL - Abnormal; Notable for the following components:   Glucose, Bld 100 (*)    Calcium 10.4 (*)    AST 60 (*)    All other components within normal limits  URINALYSIS, ROUTINE W REFLEX MICROSCOPIC - Abnormal; Notable for the following components:   Color, Urine YELLOW (*)    APPearance HAZY (*)    Hgb urine dipstick SMALL (*)    Ketones, ur 5 (*)    Leukocytes,Ua SMALL (*)    Bacteria, UA RARE (*)    All other components within normal limits  URINE CULTURE  LIPASE, BLOOD      EKG     RADIOLOGY I reviewed and interpreted the CT of the abdomen pelvis which is no acute findings   PROCEDURES:  Critical Care performed: No  Procedures  MEDICATIONS ORDERED IN ED: Medications  morphine (PF) 2 MG/ML injection 2 mg (2 mg Intravenous Given 03/05/22 0407)  lactated ringers bolus 1,000 mL (0 mLs Intravenous Stopped 03/05/22 0536)  iohexol (OMNIPAQUE) 300 MG/ML  solution 100 mL (100 mLs Intravenous Contrast Given 03/05/22 0423)     IMPRESSION / MDM / ASSESSMENT AND PLAN / ED COURSE  I reviewed the triage vital signs and the nursing notes.                              Patient's presentation is most consistent with acute complicated illness / injury requiring diagnostic workup.  Differential diagnosis includes, but is not limited to, UTI, pyelonephritis, diverticulitis, dyspepsia, IBS, constipation, enteritis, colitis, colonic malignancy  Patient is a 81 year old female presenting with a week of abdominal pain.  Has been treated empirically for diverticulitis with Cipro Flagyl and finished antibiotics.  Seen in the ED 5 days ago with negative CT of the abdomen.  She has ongoing abdominal pain that is cramping in nature with associated bloating she has had decreased p.o. intake but is still tolerating p.o. with nausea but no vomiting or fevers.  Does not feel pressure on her bladder.  Vitals notable for hypertension but are otherwise within normal limits.  She looks well her abdomen is benign but she does have some tenderness in the lower quadrants.  Laboratory workup is reassuring there is no leukocytosis.  She does have some white cells in her urine but also significant squames.  Unclear if this represents true UTI.  Overall my suspicion for acute surgical process or other life-threatening etiology of her pain is low.  However patient is requesting admission for further workup.  This point I think this is not indicated.  Given her concern I will obtain another CT  of the abdomen pelvis to rule out acute life-threatening process.  Will treat her pain with morphine and give a bolus of fluid.  Patient CT of the abdomen pelvis does not have any other acute findings.  She feels much improved after morphine and pain is almost gone.  Because of this bladder pressure and leuks on urine we will send urine culture and treat empirically for UTI with Macrobid.  Encouraged her to follow-up with both PCP and gastroenterology if symptoms or not improving.  We discussed reasons to return to the ED.  She is otherwise appropriate for discharge.     FINAL CLINICAL IMPRESSION(S) / ED DIAGNOSES   Final diagnoses:  Lower abdominal pain     Rx / DC Orders   ED Discharge Orders          Ordered    nitrofurantoin, macrocrystal-monohydrate, (MACROBID) 100 MG capsule  2 times daily        03/05/22 0535             Note:  This document was prepared using Dragon voice recognition software and may include unintentional dictation errors.   Rada Hay, MD 03/05/22 [phone]

## 2022-03-05 NOTE — Discharge Instructions (Addendum)
Your CAT scan was reassuring.  It is possible that you have a UTI.  Please take the antibiotic twice a day for the next 5 days.  Please follow-up with your primary doctor and your gastroenterologist.  If your pain is acutely worsening or you developing fevers or unable to eat or drink please return to the emergency department.

## 2022-03-06 LAB — URINE CULTURE: Culture: NO GROWTH

## 2022-03-12 ENCOUNTER — Other Ambulatory Visit: Payer: Self-pay

## 2022-03-12 DIAGNOSIS — R102 Pelvic and perineal pain: Secondary | ICD-10-CM

## 2022-03-15 ENCOUNTER — Encounter: Admission: RE | Payer: Self-pay | Source: Home / Self Care

## 2022-03-15 ENCOUNTER — Ambulatory Visit: Admission: RE | Admit: 2022-03-15 | Payer: Medicare PPO | Source: Home / Self Care

## 2022-03-15 SURGERY — COLONOSCOPY WITH PROPOFOL
Anesthesia: General

## 2022-03-19 ENCOUNTER — Ambulatory Visit
Admission: RE | Admit: 2022-03-19 | Discharge: 2022-03-19 | Disposition: A | Discharge status: Home / Self Care | Payer: Medicare PPO | Source: Ambulatory Visit | Attending: Obstetrics and Gynecology | Admitting: Obstetrics and Gynecology

## 2022-03-19 DIAGNOSIS — R102 Pelvic and perineal pain: Secondary | ICD-10-CM | POA: Diagnosis not present

## 2022-03-26 ENCOUNTER — Ambulatory Visit: Payer: Medicare PPO | Admitting: Registered Nurse

## 2022-03-26 ENCOUNTER — Encounter
Admission: RE | Disposition: A | Discharge status: Home / Self Care | Payer: Self-pay | Source: Ambulatory Visit | Attending: Gastroenterology

## 2022-03-26 ENCOUNTER — Ambulatory Visit
Admission: RE | Admit: 2022-03-26 | Discharge: 2022-03-26 | Disposition: A | Discharge status: Home / Self Care | Payer: Medicare PPO | Source: Ambulatory Visit | Attending: Gastroenterology | Admitting: Gastroenterology

## 2022-03-26 DIAGNOSIS — D122 Benign neoplasm of ascending colon: Secondary | ICD-10-CM | POA: Insufficient documentation

## 2022-03-26 DIAGNOSIS — K589 Irritable bowel syndrome without diarrhea: Secondary | ICD-10-CM | POA: Diagnosis not present

## 2022-03-26 DIAGNOSIS — E785 Hyperlipidemia, unspecified: Secondary | ICD-10-CM | POA: Insufficient documentation

## 2022-03-26 DIAGNOSIS — J45909 Unspecified asthma, uncomplicated: Secondary | ICD-10-CM | POA: Insufficient documentation

## 2022-03-26 DIAGNOSIS — K219 Gastro-esophageal reflux disease without esophagitis: Secondary | ICD-10-CM | POA: Diagnosis not present

## 2022-03-26 DIAGNOSIS — F419 Anxiety disorder, unspecified: Secondary | ICD-10-CM | POA: Diagnosis not present

## 2022-03-26 DIAGNOSIS — K640 First degree hemorrhoids: Secondary | ICD-10-CM | POA: Insufficient documentation

## 2022-03-26 DIAGNOSIS — D125 Benign neoplasm of sigmoid colon: Secondary | ICD-10-CM | POA: Insufficient documentation

## 2022-03-26 DIAGNOSIS — K5730 Diverticulosis of large intestine without perforation or abscess without bleeding: Secondary | ICD-10-CM | POA: Insufficient documentation

## 2022-03-26 DIAGNOSIS — I2510 Atherosclerotic heart disease of native coronary artery without angina pectoris: Secondary | ICD-10-CM | POA: Insufficient documentation

## 2022-03-26 HISTORY — PX: COLONOSCOPY WITH PROPOFOL: SHX5780

## 2022-03-26 SURGERY — COLONOSCOPY WITH PROPOFOL
Anesthesia: General

## 2022-03-26 MED ORDER — PHENYLEPHRINE 80 MCG/ML (10ML) SYRINGE FOR IV PUSH (FOR BLOOD PRESSURE SUPPORT)
PREFILLED_SYRINGE | INTRAVENOUS | Status: AC
  Filled 2022-03-26: qty 10

## 2022-03-26 MED ORDER — PROPOFOL 10 MG/ML IV BOLUS
INTRAVENOUS | Status: DC | PRN
  Administered 2022-03-26: 30 mg via INTRAVENOUS
  Administered 2022-03-26: 70 mg via INTRAVENOUS

## 2022-03-26 MED ORDER — LIDOCAINE HCL (CARDIAC) PF 100 MG/5ML IV SOSY
PREFILLED_SYRINGE | INTRAVENOUS | Status: DC | PRN
  Administered 2022-03-26: 20 mg via INTRAVENOUS

## 2022-03-26 MED ORDER — LIDOCAINE HCL (PF) 2 % IJ SOLN
INTRAMUSCULAR | Status: AC
  Filled 2022-03-26: qty 5

## 2022-03-26 MED ORDER — PROPOFOL 500 MG/50ML IV EMUL
INTRAVENOUS | Status: DC | PRN
  Administered 2022-03-26: 125 ug/kg/min via INTRAVENOUS

## 2022-03-26 MED ORDER — PHENYLEPHRINE HCL (PRESSORS) 10 MG/ML IV SOLN
INTRAVENOUS | Status: DC | PRN
  Administered 2022-03-26: 80 ug via INTRAVENOUS

## 2022-03-26 MED ORDER — SODIUM CHLORIDE 0.9 % IV SOLN
INTRAVENOUS | Status: DC
  Administered 2022-03-26: 1000 mL via INTRAVENOUS

## 2022-03-26 NOTE — Op Note (Signed)
[facility]enter[facility] Patient Name: Alison Bradshaw Procedure Date: 03/26/2022 12:21 PM MRN: [id number] Account #: [id number] Date of Birth: 03/06/1941 Admit Type: Outpatient Age: 81 Room: [facility] ENDO ROOM 1 Gender: Female Note Status: Finalized Instrument Name: Peds Colonoscope [id number] Procedure:             Colonoscopy Indications:           Abdominal pain in the left lower quadrant Providers:             Andrey Farmer MD, MD Referring MD:          Andres Labrum, MD (Referring MD) Medicines:             Monitored Anesthesia Care Complications:         No immediate complications. Estimated blood loss:                         Minimal. Procedure:             Pre-Anesthesia Assessment:                        - Prior to the procedure, a History and Physical was                         performed, and patient medications and allergies were                         reviewed. The patient is competent. The risks and                         benefits of the procedure and the sedation options and                         risks were discussed with the patient. All questions                         were answered and informed consent was obtained.                         Patient identification and proposed procedure were                         verified by the physician, the nurse, the                         anesthesiologist, the anesthetist and the technician                         in the endoscopy suite. Mental Status Examination:                         alert and oriented. Airway Examination: normal                         oropharyngeal airway and neck mobility. Respiratory                         Examination: clear to auscultation. CV Examination:  normal. Prophylactic Antibiotics: The patient does not                         require prophylactic antibiotics. Prior                         Anticoagulants: The patient has taken no anticoagulant                          or antiplatelet agents. ASA Grade Assessment: II - A                         patient with mild systemic disease. After reviewing                         the risks and benefits, the patient was deemed in                         satisfactory condition to undergo the procedure. The                         anesthesia plan was to use monitored anesthesia care                         (MAC). Immediately prior to administration of                         medications, the patient was re-assessed for adequacy                         to receive sedatives. The heart rate, respiratory                         rate, oxygen saturations, blood pressure, adequacy of                         pulmonary ventilation, and response to care were                         monitored throughout the procedure. The physical                         status of the patient was re-assessed after the                         procedure.                        After obtaining informed consent, the colonoscope was                         passed under direct vision. Throughout the procedure,                         the patient's blood pressure, pulse, and oxygen                         saturations were monitored continuously. The  Colonoscope was introduced through the anus and                         advanced to the the terminal ileum. The colonoscopy                         was somewhat difficult due to significant looping.                         Successful completion of the procedure was aided by                         applying abdominal pressure. The patient tolerated the                         procedure well. The quality of the bowel preparation                         was good. The terminal ileum, ileocecal valve,                         appendiceal orifice, and rectum were photographed. Findings:      The perianal and digital rectal examinations were normal.      The terminal  ileum appeared normal.      A 1 mm polyp was found in the ascending colon. The polyp was sessile.       The polyp was removed with a jumbo cold forceps. Resection and retrieval       were complete. Estimated blood loss was minimal.      A 3 mm polyp was found in the sigmoid colon. The polyp was sessile. The       polyp was removed with a cold snare. Resection and retrieval were       complete. Estimated blood loss was minimal.      Many small-mouthed diverticula were found in the sigmoid colon.      Hemorrhoids were found during retroflexion. The hemorrhoids were Grade I       (internal hemorrhoids that do not prolapse).      The exam was otherwise without abnormality on direct and retroflexion       views. Impression:            - The examined portion of the ileum was normal.                        - One 1 mm polyp in the ascending colon, removed with                         a jumbo cold forceps. Resected and retrieved.                        - One 3 mm polyp in the sigmoid colon, removed with a                         cold snare. Resected and retrieved.                        - Diverticulosis in the sigmoid colon.                        -  Hemorrhoids.                        - The examination was otherwise normal on direct and                         retroflexion views. Recommendation:        - Discharge patient to home.                        - Resume previous diet.                        - Continue present medications.                        - Await pathology results.                        - Repeat colonoscopy is not recommended due to current                         age (38 years or older) for surveillance.                        - Return to referring physician as previously                         scheduled. Procedure Code(s):     --- Professional ---                        [phone], Colonoscopy, flexible; with removal of                         tumor(s), polyp(s), or other  lesion(s) by snare                         technique                        45380, 39, Colonoscopy, flexible; with biopsy, single                         or multiple Diagnosis Code(s):     --- Professional ---                        K64.0, First degree hemorrhoids                        D12.2, Benign neoplasm of ascending colon                        D12.5, Benign neoplasm of sigmoid colon                        R10.32, Left lower quadrant pain                        K57.30, Diverticulosis of large intestine without                         perforation or abscess  without bleeding CPT copyright 2022 American [employer] Association. All rights reserved. The codes documented in this report are preliminary and upon coder review may  be revised to meet current compliance requirements. Andrey Farmer MD, MD 03/26/2022 12:54:29 PM Number of Addenda: 0 Note Initiated On: 03/26/2022 12:21 PM Scope Withdrawal Time: 0 hours 12 minutes 37 seconds  Total Procedure Duration: 0 hours 20 minutes 40 seconds  Estimated Blood Loss:  Estimated blood loss was minimal.      [facility]

## 2022-03-26 NOTE — Interval H&P Note (Signed)
History and Physical Interval Note:  03/26/2022 11:52 AM  Alison Bradshaw  has presented today for surgery, with the diagnosis of Abdominal pain, LLQ (left lower quadrant).  The various methods of treatment have been discussed with the patient and family. After consideration of risks, benefits and other options for treatment, the patient has consented to  Procedure(s): COLONOSCOPY WITH PROPOFOL (N/A) as a surgical intervention.  The patient's history has been reviewed, patient examined, no change in status, stable for surgery.  I have reviewed the patient's chart and labs.  Questions were answered to the patient's satisfaction.     Lesly Rubenstein  Ok to proceed with colonoscopy

## 2022-03-26 NOTE — Anesthesia Postprocedure Evaluation (Signed)
Anesthesia Post Note  Patient: [place] Flett  Procedure(s) Performed: COLONOSCOPY WITH PROPOFOL  Patient location during evaluation: Endoscopy Anesthesia Type: General Level of consciousness: awake and alert Pain management: pain level controlled Vital Signs Assessment: post-procedure vital signs reviewed and stable Respiratory status: spontaneous breathing, nonlabored ventilation and respiratory function stable Cardiovascular status: blood pressure returned to baseline and stable Postop Assessment: no apparent nausea or vomiting Anesthetic complications: no   No notable events documented.   Last Vitals:  Vitals:   03/26/22 1136 03/26/22 1252  BP: (!) 107/52 (!) 96/57  Resp: 16 15  Temp: 36.6 C (!) 36.2 C  SpO2: 99% 100%    Last Pain:  Vitals:   03/26/22 1312  TempSrc:   PainSc: 0-No pain                 Alphonsus Sias

## 2022-03-26 NOTE — Anesthesia Preprocedure Evaluation (Signed)
Anesthesia Evaluation  Patient identified by MRN, date of birth, ID band Patient awake    Reviewed: Allergy & Precautions, NPO status , Patient's Chart, lab work & pertinent test results  Airway Mallampati: III  TM Distance: >3 FB Neck ROM: full    Dental  (+) Chipped   Pulmonary asthma , former smoker   Pulmonary exam normal        Cardiovascular + CAD  Normal cardiovascular exam  5/22 ECHO 1. Left ventricular ejection fraction, by estimation, is 60 to 65%. The  left ventricle has normal function. The left ventricle has no regional  wall motion abnormalities. Left ventricular diastolic parameters were  normal. The average left ventricular  global longitudinal strain is -19.7 %. The global longitudinal strain is  normal.   2. Right ventricular systolic function is normal. The right ventricular  size is normal. There is normal pulmonary artery systolic pressure.   3. Left atrial size was mildly dilated.   4. The mitral valve is normal in structure. Mild mitral valve  regurgitation. No evidence of mitral stenosis.   5. The aortic valve is tricuspid. Aortic valve regurgitation is not  visualized. No aortic stenosis is present.   6. The inferior vena cava is normal in size with greater than 50%  respiratory variability, suggesting right atrial pressure of 3 mmHg.     Neuro/Psych  PSYCHIATRIC DISORDERS Anxiety      Neuromuscular disease    GI/Hepatic Neg liver ROS, hiatal hernia, PUD,GERD  Medicated and Controlled,,  Endo/Other  negative endocrine ROS    Renal/GU negative Renal ROS  negative genitourinary   Musculoskeletal   Abdominal   Peds  Hematology negative hematology ROS (+)   Anesthesia Other Findings Past Medical History: No date: Anxiety No date: Asthma No date: Atypical chest pain No date: Barrett's esophagus No date: Chronic gastritis No date: Diverticulosis No date: Duodenal ulcer No date:  Gastritis No date: GERD (gastroesophageal reflux disease) No date: Hiatal hernia No date: History of echocardiogram     Comment:  a. 08/2020 Echo: EF 60-65%, no rwma, Nl RV fxn, mild MR. No date: Hyperlipidemia No date: Non-obstructive CAD (coronary artery disease)     Comment:  a. 10/2017 Stress Echo: Nl EF, no ischemia. Mild MR; b.               12/2017 Cardiac CTA: LM nl, LAD 50p, 30d, D1/2 nl, RI nl,               LCX nl, OM1/2 nl, RCA nl, RPDA/RPL nl. Ca2+ score = 39               (46th percentile)-->statin dose escalated; c. 10/2021 MV:               No ischemia/infarct. Nl LV fxn. No date: Osteopenia No date: PSVT (paroxysmal supraventricular tachycardia)     Comment:  a. 10/2017 Holter: PSVT, max 5 beats->did not tolerate               beta blocker; b. 09/2020 Zio: 38 runs of SVT. Longest 18.1              secs (106), fastest 203 bpm (6 beats). Occas PVCs (1.1%)               - some assoc w/ triggered events. No date: Pulmonary nodules     Comment:  a. 12/2017 RLL 98m nodule noted on Cardiac CTA; b. 04/2018  RLL 46m nodule resolved; c. 09/2018 CT Chest: Previously               noted posterior RUL nodules almost completely resolved.               Bandlike scarring of RML and lingula-->consistent w/               improved atypical infxt. 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  Past Surgical History: 2008: BREAST BIOPSY; Left     Comment:  neg 2004: BREAST EXCISIONAL BIOPSY; Left     Comment:  neg No date: CATARACT EXTRACTION W/ INTRAOCULAR LENS  IMPLANT, BILATERAL;  Bilateral 01/22/2017: COLONOSCOPY WITH PROPOFOL; N/A     Comment:  Procedure: COLONOSCOPY WITH PROPOFOL;  Surgeon: Toledo,               TBenay Pike MD;  Location: [facility] ENDOSCOPY;  Service:               Gastroenterology;  Laterality: N/A; 01/20/2015: ESOPHAGOGASTRODUODENOSCOPY (EGD) WITH PROPOFOL; N/A      Comment:  Procedure: ESOPHAGOGASTRODUODENOSCOPY (EGD) WITH               PROPOFOL;  Surgeon: MLollie Sails MD;  [facility]  Service: Endoscopy;  Laterality: N/A; 01/22/2017: ESOPHAGOGASTRODUODENOSCOPY (EGD) WITH PROPOFOL; N/A     Comment:  Procedure: ESOPHAGOGASTRODUODENOSCOPY (EGD) WITH               PROPOFOL;  Surgeon: Toledo, TBenay Pike MD;  Location:               [facility] ENDOSCOPY;  Service: Gastroenterology;  Laterality:               N/A; 04/14/2018: ESOPHAGOGASTRODUODENOSCOPY (EGD) WITH PROPOFOL; N/A     Comment:  Procedure: ESOPHAGOGASTRODUODENOSCOPY (EGD) WITH               PROPOFOL;  Surgeon: SLollie Sails MD;  [facility]  Service: Endoscopy;  Laterality: N/A; 10/16/2021: ESOPHAGOGASTRODUODENOSCOPY (EGD) WITH PROPOFOL; N/A     Comment:  Procedure: ESOPHAGOGASTRODUODENOSCOPY (EGD) WITH               PROPOFOL;  Surgeon: LLesly Rubenstein MD;  Location:               [facility] ENDOSCOPY;  Service: Endoscopy;  Laterality: N/A; No date: EYE SURGERY; Right     Comment:  laser scraping 02/09/2018: OPEN REDUCTION INTERNAL FIXATION (ORIF) DISTAL RADIAL  FRACTURE; Right     Comment:  Procedure: OPEN REDUCTION INTERNAL FIXATION (ORIF)               DISTAL RADIAL FRACTURE;  Surgeon: MHessie Knows MD;                Location: [facility] ORS;  Service: Orthopedics;  Laterality:               Right; No date: TONSILLECTOMY  BMI    Body Mass Index: 22.83 kg/m      Reproductive/Obstetrics negative OB ROS                             Anesthesia Physical Anesthesia Plan  ASA: 2  Anesthesia Plan: General  Post-op Pain Management:    Induction: Intravenous  PONV Risk Score and Plan: Propofol infusion and TIVA  Airway Management Planned: Natural Airway and Nasal Cannula  Additional Equipment:   Intra-op Plan:   Post-operative Plan:   Informed Consent: I have reviewed the patients History and  Physical, chart, labs and discussed the procedure including the risks, benefits and alternatives for the proposed anesthesia with the patient or authorized representative who has indicated his/her understanding and acceptance.     Dental Advisory Given  Plan Discussed with: Anesthesiologist, CRNA and Surgeon  Anesthesia Plan Comments: (Patient consented for risks of anesthesia including but not limited to:  - adverse reactions to medications - risk of airway placement if required - damage to eyes, teeth, lips or other oral mucosa - nerve damage due to positioning  - sore throat or hoarseness - Damage to heart, brain, nerves, lungs, other parts of body or loss of life  Patient voiced understanding.)       Anesthesia Quick Evaluation

## 2022-03-26 NOTE — H&P (Signed)
Outpatient short stay form Pre-procedure 03/26/2022  Lesly Rubenstein, MD  Primary Physician: Baxter Hire, MD  Reason for visit:  Abdominal pain  History of present illness:    81 y/o lady with history of anxiety, IBS, and HLD here for colonoscopy for abdominal pain. Last colonoscopy in 2018 was unremarkable. No blood thinners. No family history of GI malignancies. No abdominal surgeries.   No current facility-administered medications for this encounter.  Medications Prior to Admission  Medication Sig Dispense Refill Last Dose   albuterol (PROVENTIL HFA;VENTOLIN HFA) 108 (90 Base) MCG/ACT inhaler Inhale 2 puffs into the lungs every 6 (six) hours as needed for shortness of breath. 3 Inhaler 0 Past Month   rosuvastatin (CRESTOR) 10 MG tablet Take 10 mg by mouth daily.   03/26/2022 at 0600   acetaminophen (TYLENOL) 500 MG tablet Take 1,000 mg by mouth every 6 (six) hours as needed.      ALPRAZolam (XANAX) 0.25 MG tablet Take 0.25 mg by mouth 2 (two) times daily as needed for anxiety.      azelastine (ASTELIN) 0.1 % nasal spray Place 1 spray into the nose 2 (two) times daily as needed.      dicyclomine (BENTYL) 10 MG capsule Take 1 capsule (10 mg total) by mouth 4 (four) times daily -  before meals and at bedtime for 10 days. 40 capsule 0    dicyclomine (BENTYL) 20 MG tablet Take 1 tablet (20 mg total) by mouth every 6 (six) hours. 20 tablet 0    fluticasone (FLONASE) 50 MCG/ACT nasal spray Place 2 sprays into both nostrils daily. 1 g 4    Fluticasone-Salmeterol (AIRDUO RESPICLICK 427/06) 237-62 MCG/ACT AEPB Inhale 1 puff into the lungs 2 (two) times daily. 1 each 10    pantoprazole (PROTONIX) 40 MG tablet Take 40 mg by mouth daily.      sucralfate (CARAFATE) 1 G tablet Take 1 g by mouth in the morning, at noon, and at bedtime. Pt only takes when she's really needs it which isn't very often      traMADol (ULTRAM) 50 MG tablet Take 1 tablet (50 mg total) by mouth every 6 (six) hours as  needed. 20 tablet 0      Allergies  Allergen Reactions   Percocet [Oxycodone-Acetaminophen] Nausea Only   Ceftin [Cefuroxime Axetil] Hives and Rash   Timentin [Ticarcillin-Pot Clavulanate] Hives and Rash     Past Medical History:  Diagnosis Date   Anxiety    Asthma    Atypical chest pain    Barrett's esophagus    Chronic gastritis    Diverticulosis    Duodenal ulcer    Gastritis    GERD (gastroesophageal reflux disease)    Hiatal hernia    History of echocardiogram    a. 08/2020 Echo: EF 60-65%, no rwma, Nl RV fxn, mild MR.   Hyperlipidemia    Non-obstructive CAD (coronary artery disease)    a. 10/2017 Stress Echo: Nl EF, no ischemia. Mild MR; b. 12/2017 Cardiac CTA: LM nl, LAD 50p, 30d, D1/2 nl, RI nl, LCX nl, OM1/2 nl, RCA nl, RPDA/RPL nl. Ca2+ score = 39 (46th percentile)-->statin dose escalated; c. 10/2021 MV: No ischemia/infarct. Nl LV fxn.   Osteopenia    PSVT (paroxysmal supraventricular tachycardia)    a. 10/2017 Holter: PSVT, max 5 beats->did not tolerate beta blocker; b. 09/2020 Zio: 38 runs of SVT. Longest 18.1 secs (106), fastest 203 bpm (6 beats). Occas PVCs (1.1%) - some assoc w/ triggered events.  Pulmonary nodules    a. 12/2017 RLL 33m nodule noted on Cardiac CTA; b. 04/2018 RLL 970mnodule resolved; c. 09/2018 CT Chest: Previously noted posterior RUL nodules almost completely resolved. Bandlike scarring of RML and lingula-->consistent w/ improved atypical infxt.   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    Review of systems:  Otherwise negative.    Physical Exam  Gen: Alert, oriented. Appears stated age.  HEENT: PERRLA. Lungs: No respiratory distress CV: RRR Abd: soft, benign, no masses Ext: No edema    Planned procedures: Proceed with colonoscopy. The patient understands the nature of the planned procedure, indications, risks, alternatives and potential complications  including but not limited to bleeding, infection, perforation, damage to internal organs and possible oversedation/side effects from anesthesia. The patient agrees and gives consent to proceed.  Please refer to procedure notes for findings, recommendations and patient disposition/instructions.     CaLesly RubensteinMD [facility]

## 2022-03-26 NOTE — Transfer of Care (Signed)
Immediate Anesthesia Transfer of Care Note  Patient: Alison Bradshaw  Procedure(s) Performed: COLONOSCOPY WITH PROPOFOL  Patient Location: PACU  Anesthesia Type:General  Level of Consciousness: drowsy  Airway & Oxygen Therapy: Patient Spontanous Breathing  Post-op Assessment: Report given to RN and Post -op Vital signs reviewed and stable  Post vital signs: Reviewed and stable  Last Vitals:  Vitals Value Taken Time  BP 96/57 03/26/22 1253  Temp 96   Pulse 65 03/26/22 1253  Resp 15 03/26/22 1253  SpO2 100 % 03/26/22 1253  Vitals shown include unvalidated device data.  Last Pain:  Vitals:   03/26/22 1136  TempSrc: Tympanic  PainSc: 4          Complications: No notable events documented.

## 2022-03-27 ENCOUNTER — Encounter: Payer: Self-pay | Admitting: Gastroenterology

## 2022-03-27 ENCOUNTER — Other Ambulatory Visit: Payer: Self-pay | Admitting: Gastroenterology

## 2022-03-27 DIAGNOSIS — R1032 Left lower quadrant pain: Secondary | ICD-10-CM

## 2022-03-27 DIAGNOSIS — I774 Celiac artery compression syndrome: Secondary | ICD-10-CM

## 2022-03-27 LAB — SURGICAL PATHOLOGY

## 2022-04-02 ENCOUNTER — Emergency Department
Admission: EM | Admit: 2022-04-02 | Discharge: 2022-04-02 | Discharge status: Against Medical Advice | Payer: Medicare PPO | Attending: Student in an Organized Health Care Education/Training Program | Admitting: Student in an Organized Health Care Education/Training Program

## 2022-04-02 ENCOUNTER — Other Ambulatory Visit: Payer: Self-pay

## 2022-04-02 DIAGNOSIS — R109 Unspecified abdominal pain: Secondary | ICD-10-CM | POA: Diagnosis present

## 2022-04-02 DIAGNOSIS — Z5321 Procedure and treatment not carried out due to patient leaving prior to being seen by health care provider: Secondary | ICD-10-CM | POA: Insufficient documentation

## 2022-04-02 LAB — COMPREHENSIVE METABOLIC PANEL
ALT: 11 U/L (ref 0–44)
AST: 27 U/L (ref 15–41)
Albumin: 4.3 g/dL (ref 3.5–5.0)
Alkaline Phosphatase: 72 U/L (ref 38–126)
Anion gap: 11 (ref 5–15)
BUN: 10 mg/dL (ref 8–23)
CO2: 24 mmol/L (ref 22–32)
Calcium: 9.8 mg/dL (ref 8.9–10.3)
Chloride: 102 mmol/L (ref 98–111)
Creatinine, Ser: 0.66 mg/dL (ref 0.44–1.00)
GFR, Estimated: >60 mL/min (ref >60)
Glucose, Bld: 105 mg/dL — ABNORMAL HIGH (ref 70–99)
Potassium: 5.3 mmol/L — ABNORMAL HIGH (ref 3.5–5.1)
Sodium: 137 mmol/L (ref 135–145)
Total Bilirubin: 0.7 mg/dL (ref 0.3–1.2)
Total Protein: 7.3 g/dL (ref 6.5–8.1)

## 2022-04-02 LAB — LIPASE, BLOOD: Lipase: 35 U/L (ref 11–51)

## 2022-04-02 LAB — CBC
HCT: 41.3 % (ref 36.0–46.0)
Hemoglobin: 13.6 g/dL (ref 12.0–15.0)
MCH: 29.5 pg (ref 26.0–34.0)
MCHC: 32.9 g/dL (ref 30.0–36.0)
MCV: 89.6 fL (ref 80.0–100.0)
Platelets: 184 10*3/uL (ref 150–400)
RBC: 4.61 MIL/uL (ref 3.87–5.11)
RDW: 13.4 % (ref 11.5–15.5)
WBC: 9.6 10*3/uL (ref 4.0–10.5)
nRBC: 0 % (ref 0.0–0.2)

## 2022-04-02 NOTE — ED Triage Notes (Signed)
Pt comes via EMs from home with c/o belly pain that started while walking her dog. Pt states it is twisting in nature. Pt denies any BM issues.

## 2022-04-04 ENCOUNTER — Encounter: Payer: Self-pay | Admitting: Internal Medicine

## 2022-04-04 ENCOUNTER — Ambulatory Visit: Payer: Medicare PPO | Admitting: Internal Medicine

## 2022-04-04 ENCOUNTER — Ambulatory Visit
Admission: RE | Admit: 2022-04-04 | Discharge: 2022-04-04 | Disposition: A | Discharge status: Home / Self Care | Payer: Medicare PPO | Source: Ambulatory Visit | Attending: Gastroenterology | Admitting: Gastroenterology

## 2022-04-04 ENCOUNTER — Other Ambulatory Visit (INDEPENDENT_AMBULATORY_CARE_PROVIDER_SITE_OTHER): Payer: Self-pay | Admitting: Vascular Surgery

## 2022-04-04 VITALS — BP 94/62 | HR 62 | Temp 97.0°F | Ht 63.0 in | Wt 135.0 lb

## 2022-04-04 DIAGNOSIS — N8501 Benign endometrial hyperplasia: Secondary | ICD-10-CM | POA: Insufficient documentation

## 2022-04-04 DIAGNOSIS — I774 Celiac artery compression syndrome: Secondary | ICD-10-CM | POA: Insufficient documentation

## 2022-04-04 DIAGNOSIS — R1032 Left lower quadrant pain: Secondary | ICD-10-CM | POA: Diagnosis present

## 2022-04-04 DIAGNOSIS — R1084 Generalized abdominal pain: Secondary | ICD-10-CM

## 2022-04-04 DIAGNOSIS — E875 Hyperkalemia: Secondary | ICD-10-CM

## 2022-04-04 DIAGNOSIS — Z23 Encounter for immunization: Secondary | ICD-10-CM

## 2022-04-04 DIAGNOSIS — Z1231 Encounter for screening mammogram for malignant neoplasm of breast: Secondary | ICD-10-CM

## 2022-04-04 LAB — COMPREHENSIVE METABOLIC PANEL
ALT: 12 U/L (ref 0–35)
AST: 22 U/L (ref 0–37)
Albumin: 4.3 g/dL (ref 3.5–5.2)
Alkaline Phosphatase: 64 U/L (ref 39–117)
BUN: 10 mg/dL (ref 6–23)
CO2: 30 mEq/L (ref 19–32)
Calcium: 9.6 mg/dL (ref 8.4–10.5)
Chloride: 96 mEq/L (ref 96–112)
Creatinine, Ser: 0.81 mg/dL (ref 0.40–1.20)
GFR: 69 mL/min (ref >60.00)
Glucose, Bld: 75 mg/dL (ref 70–99)
Potassium: 4.7 mEq/L (ref 3.5–5.1)
Sodium: 135 mEq/L (ref 135–145)
Total Bilirubin: 0.8 mg/dL (ref 0.2–1.2)
Total Protein: 6.9 g/dL (ref 6.0–8.3)

## 2022-04-04 MED ORDER — IOHEXOL 300 MG/ML  SOLN
100.0000 mL | Freq: Once | INTRAMUSCULAR | Status: AC | PRN
  Administered 2022-04-04: 100 mL via INTRAVENOUS

## 2022-04-04 MED ORDER — TRAMADOL HCL 50 MG PO TABS: 50.0000 mg | ORAL_TABLET | Freq: Two times a day (BID) | ORAL | 2 refills | Status: AC | PRN

## 2022-04-04 NOTE — Patient Instructions (Signed)
I agree with Dr Haig Prophet that Median Arcuate Ligament Syndrome needs to be ruled out and will speak with the vascular surgeons about how to obtain the diagnostic test

## 2022-04-04 NOTE — Progress Notes (Signed)
Subjective:  Patient ID: Alison Bradshaw, female    DOB: Sep 26, 1940  Age: 81 y.o. MRN: [id number]  CC: The primary encounter diagnosis was Breast cancer screening by mammogram. Diagnoses of Hyperkalemia, Need for immunization against influenza, and Generalized postprandial abdominal pain were also pertinent to this visit.  HPI Alison Bradshaw presents for establishment of care . She is a 80 yr old female who has had unintentional weight loss of  8 lbs since November secondary to fear of eating.  She reports that she has a long history of GI issues , but since NOv 20 has developed post prandial abdominal  pain  . Thus far she has undergone evaluation by GI with a normal EGD in July,  (H pylori testing negative), more recently a diagnostic colonoscopy on Dec 19 which noted 2 small polyps and multiple sigmoid tics .  She has also had a transvaginal US with negative endometrial biopsy this month,  and several  CT chest, abd and pelvis.   She has been following the low FodMap diet for the past year and a half for management of GERD symptoms.  She has been treated for  diverticulitis without change,  then for UTI without change).  She is scheduled for urology evaluation next month.  She reports a recurrent early morning sensation of "vibration"  in her lower abdomen, accompanied by discomfort described as" achy,"  followed by a thin  stringy stool , then a slightly thicker diameter stool .  Tried adding more fiber to diet,  used the fiber gummies  which has not helped.  Pain eases off at night when she takes tramadol, bentyl and alprazolam, all taken at bedtime .   The pain has been severe enough to send her to the ER ,  as recently as Dec 26,  but she declined the 10 hour wait and returned home.    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   She has a past surgical history that includes Tonsillectomy; Eye surgery (Right); Esophagogastroduodenoscopy (egd) with propofol (N/A, 01/20/2015); Breast biopsy (Left, 2008); Breast excisional biopsy (Left, 2004); Colonoscopy with propofol (N/A, 01/22/2017); Esophagogastroduodenoscopy (egd) with propofol (N/A, 01/22/2017); Cataract extraction w/ intraocular lens  implant, bilateral (Bilateral); Open reduction internal fixation (orif) distal radial fracture (Right, 02/09/2018); Esophagogastroduodenoscopy (egd) with propofol (N/A, 04/14/2018); Esophagogastroduodenoscopy (egd) with propofol (N/A, 10/16/2021); and Colonoscopy with propofol (N/A, 03/26/2022).   Her family history includes AAA (abdominal aortic aneurysm) in her father; Alzheimer's disease in her mother; Diabetes in her paternal uncle; Emphysema in her father; Hyperlipidemia in her mother; Kidney Stones in her father; Kidney cancer in her brother; Pancreatic cancer in her paternal uncle.She reports that she has quit smoking. Her smoking use included cigarettes. She has never used smokeless tobacco. She reports current alcohol use of about 2.0 standard drinks of alcohol per week. She reports that she does not use drugs.  Outpatient Medications Prior to Visit  Medication Sig Dispense Refill   acetaminophen (TYLENOL) 500 MG tablet Take 1,000 mg by mouth every 6 (six) hours as needed.     albuterol (PROVENTIL HFA;VENTOLIN HFA) 108 (90 Base)  MCG/ACT inhaler Inhale 2 puffs into the lungs every 6 (six) hours as needed for shortness of  breath. 3 Inhaler 0   ALPRAZolam (XANAX) 0.25 MG tablet Take 0.25 mg by mouth 2 (two) times daily as needed for anxiety.     dicyclomine (BENTYL) 10 MG capsule Take 1 capsule (10 mg total) by mouth 4 (four) times daily -  before meals and at bedtime for 10 days. 40 capsule 0   dicyclomine (BENTYL) 20 MG tablet Take 1 tablet (20 mg total) by mouth every 6 (six) hours. 20 tablet 0   fluticasone (FLONASE) 50 MCG/ACT nasal spray Place 2 sprays into both nostrils daily. 1 g 4   pantoprazole (PROTONIX) 40 MG tablet Take 40 mg by mouth daily.     rosuvastatin (CRESTOR) 10 MG tablet Take 10 mg by mouth daily.     sucralfate (CARAFATE) 1 G tablet Take 1 g by mouth in the morning, at noon, and at bedtime. Pt only takes when she's really needs it which isn't very often     traMADol (ULTRAM) 50 MG tablet Take 1 tablet (50 mg total) by mouth every 6 (six) hours as needed. 20 tablet 0   azelastine (ASTELIN) 0.1 % nasal spray Place 1 spray into the nose 2 (two) times daily as needed. (Patient not taking: Reported on 04/04/2022)     Fluticasone-Salmeterol (AIRDUO RESPICLICK 194/17) 408-14 MCG/ACT AEPB Inhale 1 puff into the lungs 2 (two) times daily. (Patient not taking: Reported on 04/04/2022) 1 each 10   No facility-administered medications prior to visit.    Review of Systems:  Patient denies headache, fevers, malaise,  skin rash, eye pain, sinus congestion and sinus pain, sore throat, dysphagia,  hemoptysis , cough, dyspnea, wheezing, chest pain, palpitations, orthopnea, edema,  nausea, melena, diarrhea, constipation, flank pain, dysuria, hematuria, urinary  Frequency, nocturia, numbness, tingling, seizures,  Focal weakness, Loss of consciousness,  Tremor, insomnia, depression, anxiety, and suicidal ideation.     Objective:  BP 94/62   Pulse 62   Temp (!) 97 F (36.1 C) (Oral)   Ht '5\' 3"'$  (1.6 m)   Wt 135 lb (61.2 kg)   SpO2 97%   BMI 23.91 kg/m   Physical Exam Vitals reviewed.  Constitutional:       General: She is not in acute distress.    Appearance: Normal appearance. She is normal weight. She is not ill-appearing, toxic-appearing or diaphoretic.  HENT:     Head: Normocephalic.  Eyes:     General: No scleral icterus.       Right eye: No discharge.        Left eye: No discharge.     Conjunctiva/sclera: Conjunctivae normal.  Cardiovascular:     Rate and Rhythm: Normal rate and regular rhythm.     Heart sounds: Normal heart sounds.  Pulmonary:     Effort: Pulmonary effort is normal. No respiratory distress.     Breath sounds: Normal breath sounds.  Abdominal:     Palpations: Abdomen is soft.     Comments: Increased boryborygmi. Abdominal bruit loud on exhalation,  quiet on inspiration   Musculoskeletal:        General: Normal range of motion.  Skin:    General: Skin is warm and dry.  Neurological:     General: No focal deficit present.     Mental Status: She is alert and oriented to person, place, and time. Mental status is at baseline.  Psychiatric:  Mood and Affect: Mood normal.        Behavior: Behavior normal.        Thought Content: Thought content normal.        Judgment: Judgment normal.     Assessment & Plan:  Breast cancer screening by mammogram -     3D Screening Mammogram, Left and Right; Future  Hyperkalemia -     Comprehensive metabolic panel  Need for immunization against influenza -     Flu Vaccine QUAD High Dose(Fluad)  Generalized postprandial abdominal pain Assessment & Plan: Given her unintentional weight loss,  IBS unlikely.  EGD and colonoscopy non contributory, as is transvaginal U/s and CT abd and pelvis.  Need to rule out MALS with doppler study but unsure how to order this locally.  Will discuss with vascular surgery and initiate referral  Orders: -     Ambulatory referral to Vascular Surgery  Other orders -     traMADol HCl; Take 1 tablet (50 mg total) by mouth every 12 (twelve) hours as needed for up to 5 days.  Dispense:  60 tablet; Refill: 2     Follow-up: No follow-ups on file.   I provided 60 minutes during this encounter reviewing patient's last visit with previous provider,  most recent imaging studies and labs.  Provided counseling on the above mentioned problems and coordination of care.   Crecencio Mc, MD

## 2022-04-04 NOTE — Assessment & Plan Note (Addendum)
Given her unintentional weight loss,  IBS unlikely.  EGD and colonoscopy non contributory, as is transvaginal U/s and CT abd and pelvis.  Need to rule out MALS with doppler study but unsure how to order this locally.  Will discuss with vascular surgery and initiate referral

## 2022-04-07 ENCOUNTER — Emergency Department
Admission: EM | Admit: 2022-04-07 | Discharge: 2022-04-07 | Disposition: A | Discharge status: Home / Self Care | Payer: Medicare PPO | Attending: Emergency Medicine | Admitting: Emergency Medicine

## 2022-04-07 ENCOUNTER — Encounter: Payer: Self-pay | Admitting: Emergency Medicine

## 2022-04-07 ENCOUNTER — Other Ambulatory Visit: Payer: Self-pay

## 2022-04-07 ENCOUNTER — Other Ambulatory Visit: Payer: Self-pay | Admitting: Internal Medicine

## 2022-04-07 DIAGNOSIS — R1030 Lower abdominal pain, unspecified: Secondary | ICD-10-CM

## 2022-04-07 DIAGNOSIS — R1032 Left lower quadrant pain: Secondary | ICD-10-CM | POA: Diagnosis not present

## 2022-04-07 DIAGNOSIS — R1031 Right lower quadrant pain: Secondary | ICD-10-CM | POA: Insufficient documentation

## 2022-04-07 LAB — CBC WITH DIFFERENTIAL/PLATELET
Abs Immature Granulocytes: 0.02 10*3/uL (ref 0.00–0.07)
Basophils Absolute: 0 10*3/uL (ref 0.0–0.1)
Basophils Relative: 1 %
Eosinophils Absolute: 0.2 10*3/uL (ref 0.0–0.5)
Eosinophils Relative: 4 %
HCT: 42 % (ref 36.0–46.0)
Hemoglobin: 14.2 g/dL (ref 12.0–15.0)
Immature Granulocytes: 0 %
Lymphocytes Relative: 21 %
Lymphs Abs: 0.9 10*3/uL (ref 0.7–4.0)
MCH: 29.3 pg (ref 26.0–34.0)
MCHC: 33.8 g/dL (ref 30.0–36.0)
MCV: 86.6 fL (ref 80.0–100.0)
Monocytes Absolute: 0.6 10*3/uL (ref 0.1–1.0)
Monocytes Relative: 14 %
Neutro Abs: 2.7 10*3/uL (ref 1.7–7.7)
Neutrophils Relative %: 60 %
Platelets: UNDETERMINED 10*3/uL (ref 150–400)
RBC: 4.85 MIL/uL (ref 3.87–5.11)
RDW: 13.2 % (ref 11.5–15.5)
WBC: 4.5 10*3/uL (ref 4.0–10.5)
nRBC: 0 % (ref 0.0–0.2)

## 2022-04-07 LAB — URINALYSIS, ROUTINE W REFLEX MICROSCOPIC
Bilirubin Urine: NEGATIVE
Glucose, UA: NEGATIVE mg/dL
Ketones, ur: NEGATIVE mg/dL
Leukocytes,Ua: NEGATIVE
Nitrite: NEGATIVE
Protein, ur: NEGATIVE mg/dL
Specific Gravity, Urine: 1.013 (ref 1.005–1.030)
pH: 5 (ref 5.0–8.0)

## 2022-04-07 LAB — COMPREHENSIVE METABOLIC PANEL
ALT: 11 U/L (ref 0–44)
AST: 33 U/L (ref 15–41)
Albumin: 4.3 g/dL (ref 3.5–5.0)
Alkaline Phosphatase: 65 U/L (ref 38–126)
Anion gap: 9 (ref 5–15)
BUN: 8 mg/dL (ref 8–23)
CO2: 26 mmol/L (ref 22–32)
Calcium: 9.5 mg/dL (ref 8.9–10.3)
Chloride: 102 mmol/L (ref 98–111)
Creatinine, Ser: 0.77 mg/dL (ref 0.44–1.00)
GFR, Estimated: >60 mL/min (ref >60)
Glucose, Bld: 88 mg/dL (ref 70–99)
Potassium: 4.5 mmol/L (ref 3.5–5.1)
Sodium: 137 mmol/L (ref 135–145)
Total Bilirubin: 1.1 mg/dL (ref 0.3–1.2)
Total Protein: 7.8 g/dL (ref 6.5–8.1)

## 2022-04-07 MED ORDER — SODIUM CHLORIDE 0.9 % IV SOLN: Freq: Once | INTRAVENOUS | Status: AC

## 2022-04-07 MED ORDER — CIPROFLOXACIN HCL 500 MG PO TABS: 500.0000 mg | ORAL_TABLET | Freq: Two times a day (BID) | ORAL | 0 refills | Status: AC

## 2022-04-07 MED ORDER — GABAPENTIN 300 MG PO CAPS: 300.0000 mg | ORAL_CAPSULE | Freq: Three times a day (TID) | ORAL | 0 refills | Status: DC | PRN

## 2022-04-07 MED ORDER — METRONIDAZOLE 500 MG PO TABS: 500.0000 mg | ORAL_TABLET | Freq: Three times a day (TID) | ORAL | 0 refills | Status: AC

## 2022-04-07 MED ORDER — FENTANYL CITRATE PF 50 MCG/ML IJ SOSY
50.0000 ug | PREFILLED_SYRINGE | Freq: Once | INTRAMUSCULAR | Status: AC
  Administered 2022-04-07: 50 ug via INTRAVENOUS
  Filled 2022-04-07: qty 1

## 2022-04-07 NOTE — ED Triage Notes (Signed)
Pt via POV from home. Pt c/o RLQ pain. Pt had a CT scan on 12/28 that showed appendicitis. Pt is A&Ox4 and NAD

## 2022-04-07 NOTE — ED Provider Notes (Signed)
[facility] Provider Note    Event Date/Time   First MD Initiated Contact with Patient 04/07/22 1531     (approximate)   History   Abdominal Pain   HPI  Alison Bradshaw is a 81 y.o. female  who presents to the emergency department today because of CT scan which showed appendicitis that was performed as an outpatient.  That patient had the CT done because she has been having pain for she says 3 weeks.  The pain is located across her lower abdomen and goes down to her groin.  She has been evaluated emergency department for this in the past with a negative CT scan.  She states that she has been treated empirically for diverticulitis with antibiotics as well as a urinary tract infection for antibiotics.  Additionally had a colonoscopy by her GI.  Her GI then ordered a special CT.  It was read last night so she got a call today to come to the emergency department for possible appendicitis.      Physical Exam   Triage Vital Signs: ED Triage Vitals  Enc Vitals Group     BP 04/07/22 1440 (!) 128/50     Pulse Rate 04/07/22 1440 75     Resp 04/07/22 1440 18     Temp 04/07/22 1440 (!) 97.5 F (36.4 C)     Temp Source 04/07/22 1440 Oral     SpO2 04/07/22 1440 99 %     Weight 04/07/22 1426 136 lb 11 oz (62 kg)     Height 04/07/22 1426 '5\' 3"'$  (1.6 m)     Head Circumference --      Peak Flow --      Pain Score 04/07/22 1426 6     Pain Loc --      Pain Edu? --      Excl. in [place]? --     Most recent vital signs: Vitals:   04/07/22 1440  BP: (!) 128/50  Pulse: 75  Resp: 18  Temp: (!) 97.5 F (36.4 C)  SpO2: 99%   General: Awake, alert, oriented. CV:  Good peripheral perfusion. Regular rate and rhythm. Resp:  Normal effort. Lungs clear. Abd:  No distention. Tender to palpation in the right and left lower quadrants, right greater than left. Negative Rovsing's.     ED Results / Procedures / Treatments   Labs (all labs ordered are listed, but only  abnormal results are displayed) Labs Reviewed  URINALYSIS, ROUTINE W REFLEX MICROSCOPIC - Abnormal; Notable for the following components:      Result Value   Color, Urine YELLOW (*)    APPearance CLEAR (*)    Hgb urine dipstick SMALL (*)    Bacteria, UA RARE (*)    All other components within normal limits  CBC WITH DIFFERENTIAL/PLATELET  COMPREHENSIVE METABOLIC PANEL     EKG  None   RADIOLOGY None    PROCEDURES:  Critical Care performed: No  Procedures   MEDICATIONS ORDERED IN ED: Medications - No data to display   IMPRESSION / MDM / [place] / ED COURSE  I reviewed the triage vital signs and the nursing notes.                              Differential diagnosis includes, but is not limited to, appendicitis, diverticulitis, IBS, UTI.  Patient's presentation is most consistent with acute presentation with potential threat to  life or bodily function.  The patient is on the cardiac monitor to evaluate for evidence of arrhythmia and/or significant heart rate changes.  Patient presented to the emergency department today because of concerns for appendicitis read on outpatient CT performed 3 days ago.  On my exam patient is tender in the right lower quadrant however also tender in the left lower quadrant.  Patient without leukocytosis and is afebrile.  Discussed with Dr. Lysle Pearl with surgery who will review imaging performed 3 days ago.   Dr. Lysle Pearl with surgery evaluated the imaging and evaluated the person in the emergency department. At this time does not feel patient requires emergent surgery. Patient opted for discharge home. Think this is reasonable given lack of fevers, leukocytosis. Will give another course of antibiotics and prescription for pain medication.    FINAL CLINICAL IMPRESSION(S) / ED DIAGNOSES   Final diagnoses:  Lower abdominal pain     Note:  This document was prepared using Dragon voice recognition software and may include  unintentional dictation errors.    Nance Pear, MD 04/07/22 [phone]

## 2022-04-07 NOTE — Discharge Instructions (Signed)
Please seek medical attention for any high fevers, chest pain, shortness of breath, change in behavior, persistent vomiting, bloody stool or any other new or concerning symptoms.  

## 2022-04-07 NOTE — Consult Note (Signed)
Subjective:   CC: Pelvic pain  HPI:  Alison Bradshaw is a 81 y.o. female who is consulted by Archie Balboa for evaluation of  above cc.  Symptoms were first noted 3 weeks ago.  Sudden onset, left lower quadrant.  Describes pain as sharp intermittent with episodes of worsening.  Initially diagnosed as diverticulitis and started on antibiotics by primary, which did not result in any improvement.  This led to an ED encounter where CT and labs were unremarkable diagnosed with a UTI and prescribed additional antibiotics for the UTI.  This also did not relieve the symptoms.  Around the time of the UTI diagnosis, the pain started to radiate more centrally over the suprapubic region.  This pain she describes as pressure and heaviness and "vibrations".  The suprapubic pain supposedly improved slightly with voiding.  She has had chronic issues with nonregular stooling including the size and frequency.  She states the stools have slightly changed but overall no obvious difference since the pain started.  She denies sexual intercourse or any trauma to the pelvic area.  Shortly after her suprapubic pain started she states the pain has now radiated to the right lower quadrant.  Sharp and episodic similar to the left side but not always associated with the left-sided pain.  The pain has never completely resolved on its own throughout this entire ordeal.  The 1 consistent exacerbating factor seems to be any sort of food intake.  Currently patient states the right-sided pain is exacerbated with palpation to the area.  She believes this may be a new finding but cannot 100% say for sure.  She otherwise has no other complaints such as fever, fatigue, sick contacts.  She does not endorse any difficulty urinating or dysuria, hematuria, no melena.  Most recent CT in her program report noted below CT scan prior to this 1 month ago was negative for any abnormalities in the ultrasound of the pelvis showed some possible thickening of  the endometrium but subsequent biopsy was negative.  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  Past Surgical History:  has a past surgical history that includes Tonsillectomy; Eye surgery (Right); Esophagogastroduodenoscopy (egd) with propofol (N/A, 01/20/2015); Breast biopsy (Left, 2008); Breast excisional biopsy (Left, 2004); Colonoscopy with propofol (N/A, 01/22/2017); Esophagogastroduodenoscopy (egd) with propofol (N/A, 01/22/2017); Cataract extraction w/ intraocular lens  implant, bilateral (Bilateral); Open reduction internal fixation (orif) distal radial fracture (Right, 02/09/2018); Esophagogastroduodenoscopy (egd) with propofol (N/A, 04/14/2018); Esophagogastroduodenoscopy (egd) with propofol (N/A, 10/16/2021); and Colonoscopy with propofol (N/A, 03/26/2022).  Family History: family history includes AAA (abdominal aortic aneurysm) in her father; Alzheimer's disease in her mother; Diabetes in her paternal uncle; Emphysema in her father; Hyperlipidemia in her mother; Kidney Stones in her father; Kidney cancer in her brother; Pancreatic cancer in her paternal uncle.  Social History:  reports that she has quit smoking. Her smoking use included cigarettes. She has never used smokeless tobacco. She reports current alcohol use of about 2.0 standard drinks of alcohol per week. She reports that she does not use  drugs.  Current Medications:  Prior to Admission medications   Medication Sig  Start Date End Date Taking? Authorizing Provider  acetaminophen (TYLENOL) 500 MG tablet Take 1,000 mg by mouth every 6 (six) hours as needed.    [provider]  albuterol (PROVENTIL HFA;VENTOLIN HFA) 108 (90 Base) MCG/ACT inhaler Inhale 2 puffs into the lungs every 6 (six) hours as needed for shortness of breath. 05/15/18   Wilhelmina Mcardle, MD  ALPRAZolam Duanne Moron) 0.25 MG tablet Take 0.25 mg by mouth 2 (two) times daily as needed for anxiety.    [provider]  dicyclomine (BENTYL) 10 MG capsule Take 1 capsule (10 mg total) by mouth 4 (four) times daily -  before meals and at bedtime for 10 days. 03/05/22 04/04/22  Rada Hay, MD  dicyclomine (BENTYL) 20 MG tablet Take 1 tablet (20 mg total) by mouth every 6 (six) hours. 02/28/22   Paulette Blanch, MD  fluticasone (FLONASE) 50 MCG/ACT nasal spray Place 2 sprays into both nostrils daily. 01/30/22 04/04/22  Dewayne Shorter, MD  pantoprazole (PROTONIX) 40 MG tablet Take 40 mg by mouth daily.    [provider]  rosuvastatin (CRESTOR) 10 MG tablet Take 10 mg by mouth daily. 11/27/18   [provider]  sucralfate (CARAFATE) 1 G tablet Take 1 g by mouth in the morning, at noon, and at bedtime. Pt only takes when she's really needs it which isn't very often    [provider]  traMADol (ULTRAM) 50 MG tablet Take 1 tablet (50 mg total) by mouth every 12 (twelve) hours as needed for up to 5 days. 04/04/22 04/09/22  Crecencio Mc, MD    Allergies:  Allergies as of 04/07/2022 - Review Complete 04/07/2022  Allergen Reaction Noted   Percocet [oxycodone-acetaminophen] Nausea Only 02/09/2018   Ceftin [cefuroxime axetil] Hives and Rash 01/19/2015   Timentin [ticarcillin-pot clavulanate] Hives and Rash 01/19/2015    ROS:  General: Denies weight loss, weight gain, fatigue, fevers, chills, and night sweats. Eyes: Denies blurry  vision, double vision, eye pain, itchy eyes, and tearing. Ears: Denies hearing loss, earache, and ringing in ears. Nose: Denies sinus pain, congestion, infections, runny nose, and nosebleeds. Mouth/throat: Denies hoarseness, sore throat, bleeding gums, and difficulty swallowing. Heart: Denies chest pain, palpitations, racing heart, irregular heartbeat, leg pain or swelling, and decreased activity tolerance. Respiratory: Denies breathing difficulty, shortness of breath, wheezing, cough, and sputum. GI: Denies change in appetite, heartburn, nausea, vomiting, constipation, diarrhea, and blood in stool. GU: Denies difficulty urinating, pain with urinating, urgency, frequency, blood in urine. Musculoskeletal: Denies joint stiffness, pain, swelling, muscle weakness. Skin: Denies rash, itching, mass, tumors, sores, and boils Neurologic: Denies headache, fainting, dizziness, seizures, numbness, and tingling. Psychiatric: Denies depression, anxiety, difficulty sleeping, and memory loss. Endocrine: Denies heat or cold intolerance, and increased thirst or urination. Blood/lymph: Denies easy bruising, easy bruising, and swollen glands     Objective:     BP (!) 128/50 (BP Location: Left Arm)   Pulse 75   Temp (!) 97.5 F (36.4 C) (Oral)   Resp 18   Ht '5\' 3"'$  (1.6 m)   Wt 62 kg   SpO2 99%   BMI 24.21 kg/m    Constitutional :  alert, cooperative, appears stated age, and no distress  Lymphatics/Throat:  no asymmetry, masses, or scars  Respiratory:  clear to auscultation bilaterally  Cardiovascular:  regular rate and rhythm  Gastrointestinal: Soft, no guarding, tenderness to palpation in lower pelvic including the right side suprapubic and left side no tenderness to palpation in the upper quadrants. Marland Kitchen  Musculoskeletal: Steady gait and movement  Skin: Cool and moist, no rash and no surgical scars  Psychiatric: Normal affect, non-agitated, not confused       LABS:     Latest Ref Rng & Units  04/07/2022    2:54 PM 04/04/2022    1:59 PM 04/02/2022    4:56 PM  CMP  Glucose 70 - 99 mg/dL 88  75  105   BUN 8 - 23 mg/dL '8  10  10   '$ Creatinine 0.44 - 1.00 mg/dL 0.77  0.81  0.66   Sodium 135 - 145 mmol/L 137  135  137   Potassium 3.5 - 5.1 mmol/L 4.5  4.7  5.3   Chloride 98 - 111 mmol/L 102  96  102   CO2 22 - 32 mmol/L '26  30  24   '$ Calcium 8.9 - 10.3 mg/dL 9.5  9.6  9.8   Total Protein 6.5 - 8.1 g/dL 7.8  6.9  7.3   Total Bilirubin 0.3 - 1.2 mg/dL 1.1  0.8  0.7   Alkaline Phos 38 - 126 U/L 65  64  72   AST 15 - 41 U/L 33  22  27   ALT 0 - 44 U/L '11  12  11       '$ Latest Ref Rng & Units 04/07/2022    2:54 PM 04/02/2022    4:56 PM 03/04/2022    1:09 AM  CBC  WBC 4.0 - 10.5 K/uL 4.5  9.6  6.6   Hemoglobin 12.0 - 15.0 g/dL 14.2  13.6  15.4   Hematocrit 36.0 - 46.0 % 42.0  41.3  46.3   Platelets 150 - 400 K/uL PLATELET CLUMPS NOTED ON SMEAR, UNABLE TO ESTIMATE  184  225      RADS: Narrative & Impression  CLINICAL DATA:  Median arcuate syndrome, abdominal pain, cramping, gas, pressure sudden onset of symptoms February 25, 2022   EXAM: CT ABDOMEN AND PELVIS WITH CONTRAST (ENTEROGRAPHY)   TECHNIQUE: Multidetector CT of the abdomen and pelvis during bolus administration of intravenous contrast. Negative oral contrast was given.   RADIATION DOSE REDUCTION: This exam was performed according to the departmental dose-optimization program which includes automated exposure control, adjustment of the mA and/or kV according to patient size and/or use of iterative reconstruction technique.   CONTRAST:  1102m OMNIPAQUE IOHEXOL 300 MG/ML  SOLN   COMPARISON:  03/05/2022   FINDINGS: Lower chest: No acute abnormality.   Hepatobiliary: No solid liver abnormality is seen. No gallstones, gallbladder wall thickening, or biliary dilatation.   Pancreas: Unremarkable. No pancreatic ductal dilatation or surrounding inflammatory changes.   Spleen: Normal in size without  significant abnormality.   Adrenals/Urinary Tract: Adrenal glands are unremarkable. Kidneys are normal, without renal calculi, solid lesion, or hydronephrosis. Bladder is unremarkable.   Stomach/Bowel: Stomach is within normal limits. The appendix is fluid-filled and mildly dilated, measuring up to 0.9 cm in caliber, adjacent fat stranding (series 2, image 50, series 6, image 32). No other evidence of bowel wall thickening, distention, or inflammatory changes. Sigmoid diverticulosis.   Vascular/Lymphatic: Aortic atherosclerosis. No imaging evidence of median arcuate ligament syndrome; the celiac axis artery origin is normal in appearance (series 7, image 57). No enlarged abdominal or pelvic lymph nodes.   Reproductive: No mass or other significant abnormality.   Other: No abdominal wall hernia or abnormality. No ascites.   Musculoskeletal: No acute or significant osseous findings.   IMPRESSION: 1. The appendix is fluid-filled and mildly  dilated, measuring up to 0.9 cm in caliber, adjacent fat stranding. This appearance is new in comparison to examination dated 03/05/2022 and concerning for acute appendicitis. No evidence of complicating perforation or abscess. 2. No imaging evidence of median arcuate ligament syndrome; specifically the celiac axis artery origin is patent and normal in appearance. 3. Sigmoid diverticulosis without evidence of acute diverticulitis.   These results will be called to the ordering clinician or representative by the Radiologist Assistant, and communication documented in the PACS or [employer].   Aortic Atherosclerosis (ICD10-I70.0).     Electronically Signed   By: Delanna Ahmadi M.D.   On: 04/06/2022 21:56     Assessment:      Pelvic pain  Plan:     CT images reviewed personally.  With the chronicity of the symptoms lasting for weeks since onset, along with the pain pattern and rest of the history above, overall clinical picture is  not consistent with chronic or early acute appendicitis.  Imaging itself does indicate some inflammation but degree of abnormality will be expected to be significantly worse if all her pain truly was from the appendix.  She did specifically say her right lower quadrant pain has not changed significantly for the past couple weeks.  In addition with no history of anorexia, fever, and normal white count, this all indicates the imaging findings likely will not resolve all the symptoms that she has been dealing with.  The above was explained to the patient.  I did also specify that we can still proceed with diagnostic laparoscopy and an appendectomy to remove the appendix as a differential diagnosis, understanding there will be no guarantee any of her symptoms will resolve.  Patient did not want to proceed with the surgery for diagnostic purposes at this point.  Above discussion also discussed with her primary, and she is in agreement with the above.  She will continue her workup for possible median arcuate syndrome with the vascular service, can proceed with a trial of antibiotics just to see if any improvement along with gabapentin for pain control in the meantime.  She can obviously return at any point with any significant worsening of her symptoms or new symptoms such as fever anorexia, nausea or vomiting.  Office contact number has been left with patient.  ED provider updated with plan and he is in agreement as well.    The patient understands the risks, any and all questions were answered to the patient's satisfaction.  labs/images/medications/previous chart entries reviewed personally and relevant changes/updates noted above.

## 2022-04-07 NOTE — ED Notes (Signed)
Attempted IV access x2, unsuccessful.

## 2022-04-09 ENCOUNTER — Encounter (INDEPENDENT_AMBULATORY_CARE_PROVIDER_SITE_OTHER): Payer: Self-pay | Admitting: Vascular Surgery

## 2022-04-09 ENCOUNTER — Ambulatory Visit (INDEPENDENT_AMBULATORY_CARE_PROVIDER_SITE_OTHER): Payer: Medicare PPO | Admitting: Vascular Surgery

## 2022-04-09 ENCOUNTER — Ambulatory Visit (INDEPENDENT_AMBULATORY_CARE_PROVIDER_SITE_OTHER): Payer: Medicare PPO

## 2022-04-09 VITALS — BP 107/61 | HR 63 | Resp 16 | Wt 133.2 lb

## 2022-04-09 DIAGNOSIS — R1084 Generalized abdominal pain: Secondary | ICD-10-CM | POA: Diagnosis not present

## 2022-04-09 DIAGNOSIS — E782 Mixed hyperlipidemia: Secondary | ICD-10-CM | POA: Diagnosis not present

## 2022-04-09 NOTE — Assessment & Plan Note (Signed)
lipid control important in reducing the progression of atherosclerotic disease. Continue statin therapy  

## 2022-04-09 NOTE — Progress Notes (Signed)
Patient ID: Alison Bradshaw, female   DOB: [birth year], 82 y.o.   MRN: [id number]  Chief Complaint  Patient presents with   New Patient (Initial Visit)    Ref Derrel Nip consult mesenteric    HPI Alison Bradshaw is a 82 y.o. female.  I am asked to see the patient by Dr. Derrel Nip for evaluation of her chronic abdominal pain to assess her for median arcuate ligament syndrome or mesenteric insufficiency.  Patient reports several months of postprandial bloating and bowel issues.  She is having significant discomfort with these postprandial issues.  This is resulted in somewhat of food fear and weight loss.  Given the classic nature of the symptoms, mesenteric insufficiency is being considered.  She has previously undergone 2 CT scans of the abdomen and pelvis and I have reviewed the most recent 1.  There is not a clear cause of her pathology.  The scans were not CT angiogram so full mesenteric evaluation is very difficult, but there appeared to be decent flow in the celiac artery, SMA, and IMA on the scans.  To further evaluate her mesenteric flow, a mesenteric duplex was performed today.  This showed no elevated velocities within the celiac artery, superior mesenteric artery, or inferior mesenteric artery that would be suggestive of median arcuate ligament syndrome or mesenteric ischemia.   Past Medical History:  Diagnosis Date   Anxiety    Asthma    Atypical chest pain    Barrett's esophagus    Chronic gastritis    Diverticulosis    Duodenal ulcer    Gastritis    GERD (gastroesophageal reflux disease)    Hiatal hernia    History of echocardiogram    a. 08/2020 Echo: EF 60-65%, no rwma, Nl RV fxn, mild MR.   Hyperlipidemia    Non-obstructive CAD (coronary artery disease)    a. 10/2017 Stress Echo: Nl EF, no ischemia. Mild MR; b. 12/2017 Cardiac CTA: LM nl, LAD 50p, 30d, D1/2 nl, RI nl, LCX nl, OM1/2 nl, RCA nl, RPDA/RPL nl. Ca2+ score = 39 (46th percentile)-->statin dose escalated; c. 10/2021  MV: No ischemia/infarct. Nl LV fxn.   Osteopenia    Peptic ulcer, site unspecified, unspecified as acute or chronic, without hemorrhage or perforation 11/19/2012   Formatting of this note might be different from the original.  duodenum 1966  Formatting of this note might be different from the original.  Overview:   duodenum 1966   PSVT (paroxysmal supraventricular tachycardia)    a. 10/2017 Holter: PSVT, max 5 beats->did not tolerate beta blocker; b. 09/2020 Zio: 38 runs of SVT. Longest 18.1 secs (106), fastest 203 bpm (6 beats). Occas PVCs (1.1%) - some assoc w/ triggered events.   Pulmonary nodules    a. 12/2017 RLL 41m nodule noted on Cardiac CTA; b. 04/2018 RLL 970mnodule resolved; c. 09/2018 CT Chest: Previously noted posterior RUL nodules almost completely resolved. Bandlike scarring of RML and lingula-->consistent w/ improved atypical infxt.   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    Past Surgical History:  Procedure Laterality Date   BREAST BIOPSY Left 2008   neg   BREAST EXCISIONAL BIOPSY Left 2004   neg   CATARACT EXTRACTION W/ INTRAOCULAR LENS  IMPLANT, BILATERAL Bilateral    COLONOSCOPY WITH PROPOFOL N/A 01/22/2017   Procedure: COLONOSCOPY WITH PROPOFOL;  Surgeon: Toledo, TeBenay PikeMD;  Location: [facility] ENDOSCOPY;  Service: Gastroenterology;  Laterality:  N/A;   COLONOSCOPY WITH PROPOFOL N/A 03/26/2022   Procedure: COLONOSCOPY WITH PROPOFOL;  Surgeon: Lesly Rubenstein, MD;  Location: [facility] ENDOSCOPY;  Service: Endoscopy;  Laterality: N/A;   ESOPHAGOGASTRODUODENOSCOPY (EGD) WITH PROPOFOL N/A 01/20/2015   Procedure: ESOPHAGOGASTRODUODENOSCOPY (EGD) WITH PROPOFOL;  Surgeon: Lollie Sails, MD;  Location: [facility] ENDOSCOPY;  Service: Endoscopy;  Laterality: N/A;   ESOPHAGOGASTRODUODENOSCOPY (EGD) WITH PROPOFOL N/A 01/22/2017   Procedure: ESOPHAGOGASTRODUODENOSCOPY (EGD) WITH PROPOFOL;  Surgeon: Toledo,  Benay Pike, MD;  Location: [facility] ENDOSCOPY;  Service: Gastroenterology;  Laterality: N/A;   ESOPHAGOGASTRODUODENOSCOPY (EGD) WITH PROPOFOL N/A 04/14/2018   Procedure: ESOPHAGOGASTRODUODENOSCOPY (EGD) WITH PROPOFOL;  Surgeon: Lollie Sails, MD;  Location: [facility] ENDOSCOPY;  Service: Endoscopy;  Laterality: N/A;   ESOPHAGOGASTRODUODENOSCOPY (EGD) WITH PROPOFOL N/A 10/16/2021   Procedure: ESOPHAGOGASTRODUODENOSCOPY (EGD) WITH PROPOFOL;  Surgeon: Lesly Rubenstein, MD;  Location: [facility] ENDOSCOPY;  Service: Endoscopy;  Laterality: N/A;   EYE SURGERY Right    laser scraping   OPEN REDUCTION INTERNAL FIXATION (ORIF) DISTAL RADIAL FRACTURE Right 02/09/2018   Procedure: OPEN REDUCTION INTERNAL FIXATION (ORIF) DISTAL RADIAL FRACTURE;  Surgeon: Hessie Knows, MD;  Location: [facility] ORS;  Service: Orthopedics;  Laterality: Right;   TONSILLECTOMY       Family History  Problem Relation Age of Onset   Hyperlipidemia Mother    Alzheimer's disease Mother    Emphysema Father    AAA (abdominal aortic aneurysm) Father    Kidney Stones Father    Kidney cancer Brother    Pancreatic cancer Paternal Uncle    Diabetes Paternal Uncle    Stomach cancer Neg Hx    Colon cancer Neg Hx    Esophageal cancer Neg Hx       Social History   Tobacco Use   Smoking status: Former    Types: Cigarettes   Smokeless tobacco: Never   Tobacco comments:    Quit at age 42, smoked 1 pack a month   Vaping Use   Vaping Use: Never used  Substance Use Topics   Alcohol use: Yes    Alcohol/week: 2.0 standard drinks of alcohol    Types: 2 Glasses of wine per week    Comment: weekly,none last 24 hrs   Drug use: No     Allergies  Allergen Reactions   Percocet [Oxycodone-Acetaminophen] Nausea Only   Ceftin [Cefuroxime Axetil] Hives and Rash   Timentin [Ticarcillin-Pot Clavulanate] Hives and Rash    Current Outpatient Medications  Medication Sig Dispense Refill   acetaminophen (TYLENOL) 500 MG tablet Take 1,000 mg by  mouth every 6 (six) hours as needed.     albuterol (PROVENTIL HFA;VENTOLIN HFA) 108 (90 Base) MCG/ACT inhaler Inhale 2 puffs into the lungs every 6 (six) hours as needed for shortness of breath. 3 Inhaler 0   ALPRAZolam (XANAX) 0.25 MG tablet Take 0.25 mg by mouth 2 (two) times daily as needed for anxiety.     ciprofloxacin (CIPRO) 500 MG tablet Take 1 tablet (500 mg total) by mouth 2 (two) times daily for 10 days. (Patient taking differently: Take 500 mg by mouth 2 (two) times daily.) 20 tablet 0   dicyclomine (BENTYL) 20 MG tablet Take 1 tablet (20 mg total) by mouth every 6 (six) hours. 20 tablet 0   gabapentin (NEURONTIN) 300 MG capsule Take 1 capsule (300 mg total) by mouth 3 (three) times daily as needed (pain). (Patient taking differently: Take 300 mg by mouth 3 (three) times daily as needed (pain).) 20 capsule 0   metroNIDAZOLE (FLAGYL)  500 MG tablet Take 1 tablet (500 mg total) by mouth 3 (three) times daily for 10 days. (Patient taking differently: Take 500 mg by mouth 3 (three) times daily.) 30 tablet 0   pantoprazole (PROTONIX) 40 MG tablet Take 40 mg by mouth daily.     rosuvastatin (CRESTOR) 10 MG tablet Take 10 mg by mouth daily.     sucralfate (CARAFATE) 1 G tablet Take 1 g by mouth in the morning, at noon, and at bedtime. Pt only takes when she's really needs it which isn't very often     traMADol (ULTRAM) 50 MG tablet Take 1 tablet (50 mg total) by mouth every 12 (twelve) hours as needed for up to 5 days. 60 tablet 2   dicyclomine (BENTYL) 10 MG capsule Take 1 capsule (10 mg total) by mouth 4 (four) times daily -  before meals and at bedtime for 10 days. 40 capsule 0   fluticasone (FLONASE) 50 MCG/ACT nasal spray Place 2 sprays into both nostrils daily. 1 g 4   No current facility-administered medications for this visit.      REVIEW OF SYSTEMS (Negative unless checked)  Constitutional: '[]'$ Weight loss  '[]'$ Fever  '[]'$ Chills Cardiac: '[x]'$ Chest pain   '[]'$ Chest pressure   '[x]'$ Palpitations    '[]'$ Shortness of breath when laying flat   '[]'$ Shortness of breath at rest   '[]'$ Shortness of breath with exertion. Vascular:  '[]'$ Pain in legs with walking   '[]'$ Pain in legs at rest   '[]'$ Pain in legs when laying flat   '[]'$ Claudication   '[]'$ Pain in feet when walking  '[]'$ Pain in feet at rest  '[]'$ Pain in feet when laying flat   '[]'$ History of DVT   '[]'$ Phlebitis   '[]'$ Swelling in legs   '[]'$ Varicose veins   '[]'$ Non-healing ulcers Pulmonary:   '[]'$ Uses home oxygen   '[]'$ Productive cough   '[]'$ Hemoptysis   '[]'$ Wheeze  '[]'$ COPD   '[x]'$ Asthma Neurologic:  '[]'$ Dizziness  '[]'$ Blackouts   '[]'$ Seizures   '[]'$ History of stroke   '[]'$ History of TIA  '[]'$ Aphasia   '[]'$ Temporary blindness   '[]'$ Dysphagia   '[]'$ Weakness or numbness in arms   '[]'$ Weakness or numbness in legs Musculoskeletal:  '[x]'$ Arthritis   '[]'$ Joint swelling   '[]'$ Joint pain   '[]'$ Low back pain Hematologic:  '[]'$ Easy bruising  '[]'$ Easy bleeding   '[]'$ Hypercoagulable state   '[]'$ Anemic  '[]'$ Hepatitis Gastrointestinal:  '[]'$ Blood in stool   '[]'$ Vomiting blood  '[x]'$ Gastroesophageal reflux/heartburn   '[x]'$ Abdominal pain Genitourinary:  '[]'$ Chronic kidney disease   '[]'$ Difficult urination  '[]'$ Frequent urination  '[]'$ Burning with urination   '[]'$ Hematuria Skin:  '[]'$ Rashes   '[]'$ Ulcers   '[]'$ Wounds Psychological:  '[x]'$ History of anxiety   '[]'$  History of major depression.    Physical Exam BP 107/61 (BP Location: Left Arm)   Pulse 63   Resp 16   Wt 133 lb 3.2 oz (60.4 kg)   BMI 23.60 kg/m  Gen:  Thin, NAD.  Appears younger than stated age Head: [place]/AT, No temporalis wasting.  Ear/Nose/Throat: Hearing grossly intact, nares w/o erythema or drainage, oropharynx w/o Erythema/Exudate Eyes: Conjunctiva clear, sclera non-icteric  Neck: trachea midline.  No JVD.  Pulmonary:  Good air movement, respirations not labored, no use of accessory muscles  Cardiac: RRR, no JVD Vascular:  Vessel Right Left  Radial Palpable Palpable                                   Gastrointestinal:. No masses, surgical incisions, or scars. Musculoskeletal:  M/S 5/5  throughout.  Extremities without ischemic changes.  No deformity or atrophy.  No edema. Neurologic: Sensation grossly intact in extremities.  Symmetrical.  Speech is fluent. Motor exam as listed above. Psychiatric: Judgment intact, Mood & affect appropriate for pt's clinical situation. Dermatologic: No rashes or ulcers noted.  No cellulitis or open wounds.    Radiology CT ENTERO ABD/PELVIS W CONTAST  Result Date: 04/06/2022 CLINICAL DATA:  Median arcuate syndrome, abdominal pain, cramping, gas, pressure sudden onset of symptoms February 25, 2022 EXAM: CT ABDOMEN AND PELVIS WITH CONTRAST (ENTEROGRAPHY) TECHNIQUE: Multidetector CT of the abdomen and pelvis during bolus administration of intravenous contrast. Negative oral contrast was given. RADIATION DOSE REDUCTION: This exam was performed according to the departmental dose-optimization program which includes automated exposure control, adjustment of the mA and/or kV according to patient size and/or use of iterative reconstruction technique. CONTRAST:  176m OMNIPAQUE IOHEXOL 300 MG/ML  SOLN COMPARISON:  03/05/2022 FINDINGS: Lower chest: No acute abnormality. Hepatobiliary: No solid liver abnormality is seen. No gallstones, gallbladder wall thickening, or biliary dilatation. Pancreas: Unremarkable. No pancreatic ductal dilatation or surrounding inflammatory changes. Spleen: Normal in size without significant abnormality. Adrenals/Urinary Tract: Adrenal glands are unremarkable. Kidneys are normal, without renal calculi, solid lesion, or hydronephrosis. Bladder is unremarkable. Stomach/Bowel: Stomach is within normal limits. The appendix is fluid-filled and mildly dilated, measuring up to 0.9 cm in caliber, adjacent fat stranding (series 2, image 50, series 6, image 32). No other evidence of bowel wall thickening, distention, or inflammatory changes. Sigmoid diverticulosis. Vascular/Lymphatic: Aortic atherosclerosis. No imaging evidence of median  arcuate ligament syndrome; the celiac axis artery origin is normal in appearance (series 7, image 57). No enlarged abdominal or pelvic lymph nodes. Reproductive: No mass or other significant abnormality. Other: No abdominal wall hernia or abnormality. No ascites. Musculoskeletal: No acute or significant osseous findings. IMPRESSION: 1. The appendix is fluid-filled and mildly dilated, measuring up to 0.9 cm in caliber, adjacent fat stranding. This appearance is new in comparison to examination dated 03/05/2022 and concerning for acute appendicitis. No evidence of complicating perforation or abscess. 2. No imaging evidence of median arcuate ligament syndrome; specifically the celiac axis artery origin is patent and normal in appearance. 3. Sigmoid diverticulosis without evidence of acute diverticulitis. These results will be called to the ordering clinician or representative by the Radiologist Assistant, and communication documented in the PACS or [employer] Aortic Atherosclerosis (ICD10-I70.0). Electronically Signed   By: ADelanna AhmadiM.D.   On: 04/06/2022 21:56   [place]PELVIC COMPLETE WITH TRANSVAGINAL  Result Date: 03/19/2022 CLINICAL DATA:  Lower pelvic pain in a female for 3 weeks, postmenopausal EXAM: TRANSABDOMINAL AND TRANSVAGINAL ULTRASOUND OF PELVIS TECHNIQUE: Both transabdominal and transvaginal ultrasound examinations of the pelvis were performed. Transabdominal technique was performed for global imaging of the pelvis including uterus, ovaries, adnexal regions, and pelvic cul-de-sac. It was necessary to proceed with endovaginal exam following the transabdominal exam to visualize the endometrium and ovaries as well as portions of uterus. COMPARISON:  None Available. FINDINGS: Uterus Measurements: 4.4 x 2.9 x 3.9 cm = volume: 26 mL. Variable position. Heterogeneous myometrium. Atrophic appearance. No mass. Endometrium Thickness: 9 mm. Abnormal, thickened, heterogeneous. No discrete mass. Right  ovary Not definitely visualized, obscured by bowel Left ovary Measurements: 2.1 x 1.2 x 0.9 cm = volume: 1.2 mL. Normal morphology without mass Other findings No free pelvic fluid or adnexal masses. IMPRESSION: Nonvisualization of RIGHT ovary. Abnormal thickened and heterogeneous endometrial complex 9 mm thick; endometrial thickness is considered abnormal for  an asymptomatic post-menopausal female and endometrial sampling should be considered to exclude carcinoma. These results will be called to the ordering clinician or representative by the Radiologist Assistant, and communication documented in the PACS or [employer]. Electronically Signed   By: Lavonia Dana M.D.   On: 03/19/2022 11:31    Labs Recent Results (from the past 2160 hour(s))  CBC with Differential     Status: None   Collection Time: 02/28/22 12:03 AM  Result Value Ref Range   WBC 4.5 4.0 - 10.5 K/uL   RBC 4.68 3.87 - 5.11 MIL/uL   Hemoglobin 13.7 12.0 - 15.0 g/dL   HCT 40.2 36.0 - 46.0 %   MCV 85.9 80.0 - 100.0 fL   MCH 29.3 26.0 - 34.0 pg   MCHC 34.1 30.0 - 36.0 g/dL   RDW 12.9 11.5 - 15.5 %   Platelets 179 150 - 400 K/uL   nRBC 0.0 0.0 - 0.2 %   Neutrophils Relative % 57 %   Neutro Abs 2.5 1.7 - 7.7 K/uL   Lymphocytes Relative 27 %   Lymphs Abs 1.2 0.7 - 4.0 K/uL   Monocytes Relative 13 %   Monocytes Absolute 0.6 0.1 - 1.0 K/uL   Eosinophils Relative 2 %   Eosinophils Absolute 0.1 0.0 - 0.5 K/uL   Basophils Relative 1 %   Basophils Absolute 0.1 0.0 - 0.1 K/uL   Immature Granulocytes 0 %   Abs Immature Granulocytes 0.02 0.00 - 0.07 K/uL    Comment: Performed at [facility], [address] [place], [place] [address]  Comprehensive metabolic panel     Status: Abnormal   Collection Time: 02/28/22 12:03 AM  Result Value Ref Range   Sodium 137 135 - 145 mmol/L   Potassium 3.4 (L) 3.5 - 5.1 mmol/L   Chloride 106 98 - 111 mmol/L   CO2 23 22 - 32 mmol/L   Glucose, Bld 101 (H) 70 - 99 mg/dL    Comment:  Glucose reference range applies only to samples taken after fasting for at least 8 hours.   BUN 7 (L) 8 - 23 mg/dL   Creatinine, Ser 0.80 0.44 - 1.00 mg/dL   Calcium 9.3 8.9 - 10.3 mg/dL   Total Protein 7.1 6.5 - 8.1 g/dL   Albumin 4.2 3.5 - 5.0 g/dL   AST 31 15 - 41 U/L   ALT 16 0 - 44 U/L   Alkaline Phosphatase 63 38 - 126 U/L   Total Bilirubin 0.7 0.3 - 1.2 mg/dL   GFR, Estimated >60 >60 mL/min    Comment: (NOTE) Calculated using the CKD-EPI Creatinine Equation (2021)    Anion gap 8 5 - 15    Comment: Performed at [facility], [address] [place], [place] [address]  Lipase, blood     Status: None   Collection Time: 02/28/22 12:03 AM  Result Value Ref Range   Lipase 32 11 - 51 U/L    Comment: Performed at [facility], [address] [place], [place] [address]  Urinalysis, Routine w reflex microscopic     Status: Abnormal   Collection Time: 02/28/22 12:03 AM  Result Value Ref Range   Color, Urine COLORLESS (A) YELLOW   APPearance CLEAR (A) CLEAR   Specific Gravity, Urine 1.001 (L) 1.005 - 1.030   pH 6.0 5.0 - 8.0   Glucose, UA NEGATIVE NEGATIVE mg/dL   Hgb urine dipstick NEGATIVE NEGATIVE   Bilirubin Urine NEGATIVE NEGATIVE   Ketones,  ur NEGATIVE NEGATIVE mg/dL   Protein, ur NEGATIVE NEGATIVE mg/dL   Nitrite NEGATIVE NEGATIVE   Leukocytes,Ua TRACE (A) NEGATIVE   RBC / HPF 0-5 0 - 5 RBC/hpf   WBC, UA 0-5 0 - 5 WBC/hpf   Bacteria, UA RARE (A) NONE SEEN   Squamous Epithelial / LPF 0-5 0 - 5    Comment: Performed at [facility], [address] [place], [place] [address]  CBC with Differential     Status: Abnormal   Collection Time: 03/04/22  1:09 AM  Result Value Ref Range   WBC 6.6 4.0 - 10.5 K/uL   RBC 5.33 (H) 3.87 - 5.11 MIL/uL   Hemoglobin 15.4 (H) 12.0 - 15.0 g/dL   HCT 46.3 (H) 36.0 - 46.0 %   MCV 86.9 80.0 - 100.0 fL   MCH 28.9 26.0 - 34.0 pg   MCHC 33.3 30.0 - 36.0 g/dL   RDW 13.1 11.5 - 15.5 %   Platelets 225 150 - 400  K/uL   nRBC 0.0 0.0 - 0.2 %   Neutrophils Relative % 59 %   Neutro Abs 4.0 1.7 - 7.7 K/uL   Lymphocytes Relative 26 %   Lymphs Abs 1.7 0.7 - 4.0 K/uL   Monocytes Relative 11 %   Monocytes Absolute 0.7 0.1 - 1.0 K/uL   Eosinophils Relative 2 %   Eosinophils Absolute 0.1 0.0 - 0.5 K/uL   Basophils Relative 1 %   Basophils Absolute 0.1 0.0 - 0.1 K/uL   Immature Granulocytes 1 %   Abs Immature Granulocytes 0.03 0.00 - 0.07 K/uL    Comment: Performed at [facility], [address] [place], [place] [address]  Comprehensive metabolic panel     Status: Abnormal   Collection Time: 03/04/22  1:09 AM  Result Value Ref Range   Sodium 141 135 - 145 mmol/L   Potassium 4.4 3.5 - 5.1 mmol/L   Chloride 104 98 - 111 mmol/L   CO2 27 22 - 32 mmol/L   Glucose, Bld 100 (H) 70 - 99 mg/dL    Comment: Glucose reference range applies only to samples taken after fasting for at least 8 hours.   BUN 12 8 - 23 mg/dL   Creatinine, Ser 0.92 0.44 - 1.00 mg/dL   Calcium 10.4 (H) 8.9 - 10.3 mg/dL   Total Protein 8.1 6.5 - 8.1 g/dL   Albumin 4.9 3.5 - 5.0 g/dL   AST 60 (H) 15 - 41 U/L   ALT 35 0 - 44 U/L   Alkaline Phosphatase 76 38 - 126 U/L   Total Bilirubin 1.1 0.3 - 1.2 mg/dL   GFR, Estimated >60 >60 mL/min    Comment: (NOTE) Calculated using the CKD-EPI Creatinine Equation (2021)    Anion gap 10 5 - 15    Comment: Performed at [facility], [address] [place], [place] [address]  Lipase, blood     Status: None   Collection Time: 03/04/22  1:09 AM  Result Value Ref Range   Lipase 37 11 - 51 U/L    Comment: Performed at [facility], [address] [place], [place] [address]  Urinalysis, Routine w reflex microscopic     Status: Abnormal   Collection Time: 03/04/22  1:09 AM  Result Value Ref Range   Color, Urine YELLOW (A) YELLOW   APPearance HAZY (A) CLEAR   Specific Gravity, Urine 1.016 1.005 - 1.030   pH 6.0 5.0 - 8.0   Glucose, UA  NEGATIVE NEGATIVE mg/dL    Hgb urine dipstick SMALL (A) NEGATIVE   Bilirubin Urine NEGATIVE NEGATIVE   Ketones, ur 5 (A) NEGATIVE mg/dL   Protein, ur NEGATIVE NEGATIVE mg/dL   Nitrite NEGATIVE NEGATIVE   Leukocytes,Ua SMALL (A) NEGATIVE   RBC / HPF 6-10 0 - 5 RBC/hpf   WBC, UA 21-50 0 - 5 WBC/hpf   Bacteria, UA RARE (A) NONE SEEN   Squamous Epithelial / LPF 6-10 0 - 5   Mucus PRESENT     Comment: Performed at [facility], [address] [place], [place] [address]  POCT urinalysis dipstick     Status: Abnormal   Collection Time: 03/04/22  4:03 PM  Result Value Ref Range   Color, UA yellow yellow   Clarity, UA clear clear   Glucose, UA negative negative mg/dL   Bilirubin, UA negative negative   Ketones, POC UA negative negative mg/dL   Spec Grav, UA 1.010 1.010 - 1.025   Blood, UA trace-lysed (A) negative   pH, UA 7.0 5.0 - 8.0   Protein Ur, POC negative negative mg/dL   Urobilinogen, UA 0.2 0.2 or 1.0 E.U./dL   Nitrite, UA Negative Negative   Leukocytes, UA Negative Negative  Urine Culture     Status: None   Collection Time: 03/05/22  1:09 AM   Specimen: Urine, Clean Catch  Result Value Ref Range   Specimen Description      URINE, CLEAN CATCH Performed at [facility], [place], [address] [place], [place] [address]    Special Requests      NONE Performed at [facility], [address], [place], [place] [address]    Culture      NO GROWTH Performed at [facility], [place] [address], [place], [place] [address]    Report Status 03/06/2022 FINAL   Surgical pathology     Status: None   Collection Time: 03/26/22 12:38 PM  Result Value Ref Range   SURGICAL PATHOLOGY      SURGICAL PATHOLOGY CASE: [id number] PATIENT: Carolynn Comment Surgical Pathology Report     Specimen Submitted: A. Colon polyp, ascending; cbx B. Colon polyp, sigmoid; cold snare  Clinical History: Abdominal pain, LLQ.  Colon polyps      DIAGNOSIS: A.  COLON, ASCENDING, POLYP;  BIOPSY: - TUBULAR ADENOMA. - NO EVIDENCE OF HIGH-GRADE DYSPLASIA OR MALIGNANCY.  B.  COLON, SIGMOID, POLYP; COLD SNARE BIOPSY: - COLONIC MUCOSA WITH MILD MUCOSAL HYPERPLASIA AND VASCULAR CONGESTION. - FEATURES OF A SPECIFIC POLYP ARE NOT SEEN. - NO DYSPLASIA.  GROSS DESCRIPTION: A. Labeled: Ascending colon polyp cbx Received: Formalin Collection time: 12:38 PM on 03/26/2022 Placed into formalin time: 12:38 PM on 03/26/2022 Tissue fragment(s): 1 Size: 0.8 x 0.3 x 0.1 cm Description: Tan elongated soft tissue fragment Entirely submitted in 1 cassette.  B. Labeled: Sigmoid colon polyp cold snare Received: Formalin Collection time: 12:44 PM on 03/26/2022 Placed into formalin t ime: 12:44 PM on 03/26/2022 Tissue fragment(s): 1 Size: 0.3 x 0.3 x 0.1 cm Description: Pink-tan soft tissue fragment Entirely submitted in 1 cassette.  RB 03/26/2022  Final Diagnosis performed by Theodora Blow, MD.   Electronically signed 03/27/2022 1:36:27PM The electronic signature indicates that the named Attending Pathologist has evaluated the specimen Technical component performed at [facility], [address], [place], [place] [address] Lab: [phone] Dir: Rush Farmer, MD, MMM  Professional component performed at [facility], [facility], [place], [place],  [address] Lab: [place].  Ronnald Ramp, MD   Lipase, blood     Status: None   Collection Time: 04/02/22  4:56 PM  Result Value Ref Range   Lipase 35 11 - 51 U/L    Comment: Performed at [facility], [address] [place], [place] [address]  Comprehensive metabolic panel     Status: Abnormal   Collection Time: 04/02/22  4:56 PM  Result Value Ref Range   Sodium 137 135 - 145 mmol/L   Potassium 5.3 (H) 3.5 - 5.1 mmol/L   Chloride 102 98 - 111 mmol/L   CO2 24 22 - 32 mmol/L   Glucose, Bld 105 (H) 70 - 99 mg/dL    Comment: Glucose reference range applies only to samples taken after fasting  for at least 8 hours.   BUN 10 8 - 23 mg/dL   Creatinine, Ser 0.66 0.44 - 1.00 mg/dL   Calcium 9.8 8.9 - 10.3 mg/dL   Total Protein 7.3 6.5 - 8.1 g/dL   Albumin 4.3 3.5 - 5.0 g/dL   AST 27 15 - 41 U/L   ALT 11 0 - 44 U/L   Alkaline Phosphatase 72 38 - 126 U/L   Total Bilirubin 0.7 0.3 - 1.2 mg/dL   GFR, Estimated >60 >60 mL/min    Comment: (NOTE) Calculated using the CKD-EPI Creatinine Equation (2021)    Anion gap 11 5 - 15    Comment: Performed at [facility], [address] [place], [place] [address]  CBC     Status: None   Collection Time: 04/02/22  4:56 PM  Result Value Ref Range   WBC 9.6 4.0 - 10.5 K/uL   RBC 4.61 3.87 - 5.11 MIL/uL   Hemoglobin 13.6 12.0 - 15.0 g/dL   HCT 41.3 36.0 - 46.0 %   MCV 89.6 80.0 - 100.0 fL   MCH 29.5 26.0 - 34.0 pg   MCHC 32.9 30.0 - 36.0 g/dL   RDW 13.4 11.5 - 15.5 %   Platelets 184 150 - 400 K/uL   nRBC 0.0 0.0 - 0.2 %    Comment: Performed at [facility], [address] [place], [place] [address]  Comprehensive metabolic panel     Status: None   Collection Time: 04/04/22  1:59 PM  Result Value Ref Range   Sodium 135 135 - 145 mEq/L   Potassium 4.7 3.5 - 5.1 mEq/L   Chloride 96 96 - 112 mEq/L   CO2 30 19 - 32 mEq/L   Glucose, Bld 75 70 - 99 mg/dL   BUN 10 6 - 23 mg/dL   Creatinine, Ser 0.81 0.40 - 1.20 mg/dL   Total Bilirubin 0.8 0.2 - 1.2 mg/dL   Alkaline Phosphatase 64 39 - 117 U/L   AST 22 0 - 37 U/L   ALT 12 0 - 35 U/L   Total Protein 6.9 6.0 - 8.3 g/dL   Albumin 4.3 3.5 - 5.2 g/dL   GFR 69.00 >60.00 mL/min    Comment: Calculated using the CKD-EPI Creatinine Equation (2021)   Calcium 9.6 8.4 - 10.5 mg/dL  CBC with Differential     Status: None   Collection Time: 04/07/22  2:54 PM  Result Value Ref Range   WBC 4.5 4.0 - 10.5 K/uL    Comment: REPEATED TO VERIFY WHITE COUNT CONFIRMED ON SMEAR    RBC 4.85 3.87 - 5.11 MIL/uL   Hemoglobin 14.2 12.0 - 15.0 g/dL   HCT 42.0 36.0 - 46.0 %   MCV  86.6 80.0  - 100.0 fL   MCH 29.3 26.0 - 34.0 pg   MCHC 33.8 30.0 - 36.0 g/dL   RDW 13.2 11.5 - 15.5 %   Platelets PLATELET CLUMPS NOTED ON SMEAR, UNABLE TO ESTIMATE 150 - 400 K/uL    Comment: PLATELET CLUMPS NOTED ON SMEAR, UNABLE TO ESTIMATE   nRBC 0.0 0.0 - 0.2 %   Neutrophils Relative % 60 %   Neutro Abs 2.7 1.7 - 7.7 K/uL   Lymphocytes Relative 21 %   Lymphs Abs 0.9 0.7 - 4.0 K/uL   Monocytes Relative 14 %   Monocytes Absolute 0.6 0.1 - 1.0 K/uL   Eosinophils Relative 4 %   Eosinophils Absolute 0.2 0.0 - 0.5 K/uL   Basophils Relative 1 %   Basophils Absolute 0.0 0.0 - 0.1 K/uL   Immature Granulocytes 0 %   Abs Immature Granulocytes 0.02 0.00 - 0.07 K/uL    Comment: Performed at [facility], [address] [place], [place] [address]  Comprehensive metabolic panel     Status: None   Collection Time: 04/07/22  2:54 PM  Result Value Ref Range   Sodium 137 135 - 145 mmol/L   Potassium 4.5 3.5 - 5.1 mmol/L    Comment: HEMOLYSIS AT THIS LEVEL MAY AFFECT RESULT   Chloride 102 98 - 111 mmol/L   CO2 26 22 - 32 mmol/L   Glucose, Bld 88 70 - 99 mg/dL    Comment: Glucose reference range applies only to samples taken after fasting for at least 8 hours.   BUN 8 8 - 23 mg/dL   Creatinine, Ser 0.77 0.44 - 1.00 mg/dL   Calcium 9.5 8.9 - 10.3 mg/dL   Total Protein 7.8 6.5 - 8.1 g/dL   Albumin 4.3 3.5 - 5.0 g/dL   AST 33 15 - 41 U/L    Comment: HEMOLYSIS AT THIS LEVEL MAY AFFECT RESULT   ALT 11 0 - 44 U/L    Comment: HEMOLYSIS AT THIS LEVEL MAY AFFECT RESULT   Alkaline Phosphatase 65 38 - 126 U/L   Total Bilirubin 1.1 0.3 - 1.2 mg/dL    Comment: HEMOLYSIS AT THIS LEVEL MAY AFFECT RESULT   GFR, Estimated >60 >60 mL/min    Comment: (NOTE) Calculated using the CKD-EPI Creatinine Equation (2021)    Anion gap 9 5 - 15    Comment: Performed at [facility], [address] [place], [place] [address]  Urinalysis, Routine w reflex microscopic Urine, Clean Catch     Status: Abnormal    Collection Time: 04/07/22  2:56 PM  Result Value Ref Range   Color, Urine YELLOW (A) YELLOW   APPearance CLEAR (A) CLEAR   Specific Gravity, Urine 1.013 1.005 - 1.030   pH 5.0 5.0 - 8.0   Glucose, UA NEGATIVE NEGATIVE mg/dL   Hgb urine dipstick SMALL (A) NEGATIVE   Bilirubin Urine NEGATIVE NEGATIVE   Ketones, ur NEGATIVE NEGATIVE mg/dL   Protein, ur NEGATIVE NEGATIVE mg/dL   Nitrite NEGATIVE NEGATIVE   Leukocytes,Ua NEGATIVE NEGATIVE   RBC / HPF 0-5 0 - 5 RBC/hpf   WBC, UA 0-5 0 - 5 WBC/hpf   Bacteria, UA RARE (A) NONE SEEN   Squamous Epithelial / LPF 0-5 0 - 5 /HPF   Mucus PRESENT     Comment: Performed at [facility], [address] [place], [place] [address]    Assessment/Plan:  Generalized postprandial abdominal pain To further evaluate her mesenteric flow, a mesenteric duplex  was performed today.  This showed no elevated velocities within the celiac artery, superior mesenteric artery, or inferior mesenteric artery that would be suggestive of median arcuate ligament syndrome or mesenteric ischemia. Given this finding, I do not think that she has median arcuate ligament syndrome or a vascular cause of her symptoms at this time.  I do think it was appropriate to evaluate given her symptomatology.  I will defer further workup to her primary care physician and gastroenterologist.  I will see her back as needed.  Mixed hyperlipidemia lipid control important in reducing the progression of atherosclerotic disease. Continue statin therapy      Leotis Pain 04/09/2022, 10:17 AM   This note was created with Dragon medical transcription system.  Any errors from dictation are unintentional.

## 2022-04-09 NOTE — Assessment & Plan Note (Signed)
To further evaluate her mesenteric flow, a mesenteric duplex was performed today.  This showed no elevated velocities within the celiac artery, superior mesenteric artery, or inferior mesenteric artery that would be suggestive of median arcuate ligament syndrome or mesenteric ischemia. Given this finding, I do not think that she has median arcuate ligament syndrome or a vascular cause of her symptoms at this time.  I do think it was appropriate to evaluate given her symptomatology.  I will defer further workup to her primary care physician and gastroenterologist.  I will see her back as needed.

## 2022-04-11 ENCOUNTER — Other Ambulatory Visit: Payer: Self-pay | Admitting: Internal Medicine

## 2022-04-11 MED ORDER — PEG 3350-KCL-NABCB-NACL-NASULF 236 G PO SOLR: ORAL | 0 refills | Status: DC

## 2022-04-18 ENCOUNTER — Encounter: Payer: Self-pay | Admitting: Urology

## 2022-04-18 ENCOUNTER — Ambulatory Visit: Payer: Medicare PPO | Admitting: Urology

## 2022-04-18 VITALS — BP 130/74 | HR 80 | Ht 67.0 in | Wt 135.0 lb

## 2022-04-18 DIAGNOSIS — R350 Frequency of micturition: Secondary | ICD-10-CM

## 2022-04-18 DIAGNOSIS — R102 Pelvic and perineal pain: Secondary | ICD-10-CM | POA: Diagnosis not present

## 2022-04-18 DIAGNOSIS — R3915 Urgency of urination: Secondary | ICD-10-CM | POA: Diagnosis not present

## 2022-04-18 LAB — BLADDER SCAN AMB NON-IMAGING: Scan Result: 8

## 2022-04-18 MED ORDER — GEMTESA 75 MG PO TABS: 75.0000 mg | ORAL_TABLET | Freq: Every day | ORAL | 0 refills | Status: DC

## 2022-04-18 NOTE — Progress Notes (Signed)
04/18/2022 8:05 PM   Alison Bradshaw 1941-03-22 [id number]  Referring provider: Geanie Kenning, PA-C [address],  [place] [address]  Chief Complaint  Patient presents with   Other    HPI: Alison Bradshaw is a 82 y.o. female referred for evaluation of lower urinary tract symptoms and pelvic pain.  Onset intermittent left lower quadrant abdominal pain late November 2023 initially felt consistent with diverticulitis.  Has had associated urinary frequency, urgency and pelvic heaviness.  The discomfort radiates to the left lower quadrant to the costal margin She has several evaluations including GI, GYN, general surgery and vascular surgery and a definite etiology has not been identified Several CT scans have not shown urologic abnormality She had 1 UA 11/27 which showed microhematuria and pyuria however there were significant squamous epithelial cells indicating a contaminated specimen and the urine culture was negative She states over the past week her symptoms have significantly improved and are less severe and less frequent Denies dysuria or gross hematuria   PMH: Past Medical History:  Diagnosis Date   Anxiety    Asthma    Atypical chest pain    Barrett's esophagus    Chronic gastritis    Diverticulosis    Duodenal ulcer    Gastritis    GERD (gastroesophageal reflux disease)    Hiatal hernia    History of echocardiogram    a. 08/2020 Echo: EF 60-65%, no rwma, Nl RV fxn, mild MR.   Hyperlipidemia    Non-obstructive CAD (coronary artery disease)    a. 10/2017 Stress Echo: Nl EF, no ischemia. Mild MR; b. 12/2017 Cardiac CTA: LM nl, LAD 50p, 30d, D1/2 nl, RI nl, LCX nl, OM1/2 nl, RCA nl, RPDA/RPL nl. Ca2+ score = 39 (46th percentile)-->statin dose escalated; c. 10/2021 MV: No ischemia/infarct. Nl LV fxn.   Osteopenia    Peptic ulcer, site unspecified, unspecified as acute or chronic, without hemorrhage or perforation 11/19/2012   Formatting of this  note might be different from the original.  duodenum 1968  Formatting of this note might be different from the original.  Overview:   duodenum 1968   PSVT (paroxysmal supraventricular tachycardia)    a. 10/2017 Holter: PSVT, max 5 beats->did not tolerate beta blocker; b. 09/2020 Zio: 38 runs of SVT. Longest 18.1 secs (106), fastest 203 bpm (6 beats). Occas PVCs (1.1%) - some assoc w/ triggered events.   Pulmonary nodules    a. 12/2017 RLL 51m nodule noted on Cardiac CTA; b. 04/2018 RLL 928mnodule resolved; c. 09/2018 CT Chest: Previously noted posterior RUL nodules almost completely resolved. Bandlike scarring of RML and lingula-->consistent w/ improved atypical infxt.   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    Surgical History: Past Surgical History:  Procedure Laterality Date   BREAST BIOPSY Left 2008   neg   BREAST EXCISIONAL BIOPSY Left 2004   neg   CATARACT EXTRACTION W/ INTRAOCULAR LENS  IMPLANT, BILATERAL Bilateral    COLONOSCOPY WITH PROPOFOL N/A 01/22/2017   Procedure: COLONOSCOPY WITH PROPOFOL;  Surgeon: Toledo, TeBenay PikeMD;  Location: [facility] ENDOSCOPY;  Service: Gastroenterology;  Laterality: N/A;   COLONOSCOPY WITH PROPOFOL N/A 03/26/2022   Procedure: COLONOSCOPY WITH PROPOFOL;  Surgeon: LoLesly RubensteinMD;  Location: [facility] ENDOSCOPY;  Service: Endoscopy;  Laterality: N/A;   ESOPHAGOGASTRODUODENOSCOPY (EGD) WITH PROPOFOL N/A 01/20/2015   Procedure: ESOPHAGOGASTRODUODENOSCOPY (EGD) WITH PROPOFOL;  Surgeon: MaLollie Sails  MD;  Location: [facility] ENDOSCOPY;  Service: Endoscopy;  Laterality: N/A;   ESOPHAGOGASTRODUODENOSCOPY (EGD) WITH PROPOFOL N/A 01/22/2017   Procedure: ESOPHAGOGASTRODUODENOSCOPY (EGD) WITH PROPOFOL;  Surgeon: Toledo, Benay Pike, MD;  Location: [facility] ENDOSCOPY;  Service: Gastroenterology;  Laterality: N/A;   ESOPHAGOGASTRODUODENOSCOPY (EGD) WITH PROPOFOL N/A 04/14/2018   Procedure:  ESOPHAGOGASTRODUODENOSCOPY (EGD) WITH PROPOFOL;  Surgeon: Lollie Sails, MD;  Location: [facility] ENDOSCOPY;  Service: Endoscopy;  Laterality: N/A;   ESOPHAGOGASTRODUODENOSCOPY (EGD) WITH PROPOFOL N/A 10/16/2021   Procedure: ESOPHAGOGASTRODUODENOSCOPY (EGD) WITH PROPOFOL;  Surgeon: Lesly Rubenstein, MD;  Location: [facility] ENDOSCOPY;  Service: Endoscopy;  Laterality: N/A;   EYE SURGERY Right    laser scraping   OPEN REDUCTION INTERNAL FIXATION (ORIF) DISTAL RADIAL FRACTURE Right 02/09/2018   Procedure: OPEN REDUCTION INTERNAL FIXATION (ORIF) DISTAL RADIAL FRACTURE;  Surgeon: Hessie Knows, MD;  Location: [facility] ORS;  Service: Orthopedics;  Laterality: Right;   TONSILLECTOMY      Home Medications:  Allergies as of 04/18/2022       Reactions   Percocet [oxycodone-acetaminophen] Nausea Only   Ceftin [cefuroxime Axetil] Hives, Rash   Timentin [ticarcillin-pot Clavulanate] Hives, Rash        Medication List        Accurate as of April 18, 2022  8:05 PM. If you have any questions, ask your nurse or doctor.          acetaminophen 500 MG tablet Commonly known as: TYLENOL Take 1,000 mg by mouth every 6 (six) hours as needed.   albuterol 108 (90 Base) MCG/ACT inhaler Commonly known as: VENTOLIN HFA Inhale 2 puffs into the lungs every 6 (six) hours as needed for shortness of breath.   ALPRAZolam 0.25 MG tablet Commonly known as: XANAX Take 0.25 mg by mouth 2 (two) times daily as needed for anxiety.   dicyclomine 20 MG tablet Commonly known as: BENTYL Take 1 tablet (20 mg total) by mouth every 6 (six) hours.   dicyclomine 10 MG capsule Commonly known as: BENTYL Take 1 capsule (10 mg total) by mouth 4 (four) times daily -  before meals and at bedtime for 10 days.   fluticasone 50 MCG/ACT nasal spray Commonly known as: FLONASE Place 2 sprays into both nostrils daily.   gabapentin 300 MG capsule Commonly known as: Neurontin Take 1 capsule (300 mg total) by mouth 3 (three) times  daily as needed (pain).   Gemtesa 75 MG Tabs Generic drug: Vibegron Take 75 mg by mouth daily. Started by: Abbie Sons, MD   pantoprazole 40 MG tablet Commonly known as: PROTONIX Take 40 mg by mouth daily.   polyethylene glycol 236 g solution Commonly known as: GoLYTELY Drink 8 ounces every 30 minutes until constipation is relieved   rosuvastatin 10 MG tablet Commonly known as: CRESTOR Take 10 mg by mouth daily.   sucralfate 1 g tablet Commonly known as: CARAFATE Take 1 g by mouth in the morning, at noon, and at bedtime. Pt only takes when she's really needs it which isn't very often        Allergies:  Allergies  Allergen Reactions   Percocet [Oxycodone-Acetaminophen] Nausea Only   Ceftin [Cefuroxime Axetil] Hives and Rash   Timentin [Ticarcillin-Pot Clavulanate] Hives and Rash    Family History: Family History  Problem Relation Age of Onset   Hyperlipidemia Mother    Alzheimer's disease Mother    Emphysema Father    AAA (abdominal aortic aneurysm) Father    Kidney Stones Father    Kidney cancer Brother  Pancreatic cancer Paternal Uncle    Diabetes Paternal Uncle    Stomach cancer Neg Hx    Colon cancer Neg Hx    Esophageal cancer Neg Hx     Social History:  reports that she has quit smoking. Her smoking use included cigarettes. She has never used smokeless tobacco. She reports current alcohol use of about 2.0 standard drinks of alcohol per week. She reports that she does not use drugs.   Physical Exam: BP 130/74   Pulse 80   Ht '5\' 7"'$  (1.702 m)   Wt 135 lb (61.2 kg)   BMI 21.14 kg/m   Constitutional:  Alert and oriented, No acute distress. HEENT: [place] AT Respiratory: Normal respiratory effort, no increased work of breathing. Skin: No rashes, bruises or suspicious lesions. Neurologic: Grossly intact, no focal deficits, moving all 4 extremities. Psychiatric: Normal mood and affect.  Laboratory Data:  Urinalysis Pending   Pertinent  Imaging: CT images were prepped on the reviewed and interpreted  Assessment & Plan:   82 y.o. female with urinary frequency, urgency and pelvic pressure.  The symptoms are not typical for overactive bladder however her left lower quadrant abdominal pain which has been more significant would be atypical for a GU etiology Her symptoms have actually improved significantly over the last week Recommended a beta 3 agonist trial and she was given samples of Gemtesa 75 mg daily x 28 days.  She was informed this has an excellent chance of improving her storage related voiding symptoms and pelvic heaviness though may not improve her left lower quadrant abdominal pain Follow-up 4 weeks for symptom reassessment   Abbie Sons, MD  [place] [address] [place], [place] [address] [phone]

## 2022-04-19 ENCOUNTER — Encounter: Payer: Self-pay | Admitting: *Deleted

## 2022-04-19 LAB — MICROSCOPIC EXAMINATION: Bacteria, UA: NONE SEEN

## 2022-04-19 LAB — URINALYSIS, COMPLETE
Bilirubin, UA: NEGATIVE
Glucose, UA: NEGATIVE
Ketones, UA: NEGATIVE
Leukocytes,UA: NEGATIVE
Nitrite, UA: NEGATIVE
Protein,UA: NEGATIVE
Specific Gravity, UA: 1.01 (ref 1.005–1.030)
Urobilinogen, Ur: 0.2 mg/dL (ref 0.2–1.0)
pH, UA: 6.5 (ref 5.0–7.5)

## 2022-04-21 ENCOUNTER — Encounter: Payer: Self-pay | Admitting: Urology

## 2022-05-22 ENCOUNTER — Ambulatory Visit
Admission: RE | Admit: 2022-05-22 | Discharge: 2022-05-22 | Disposition: A | Discharge status: Home / Self Care | Payer: Medicare PPO | Source: Ambulatory Visit | Attending: Internal Medicine | Admitting: Internal Medicine

## 2022-05-22 ENCOUNTER — Encounter: Payer: Self-pay | Admitting: Urology

## 2022-05-22 ENCOUNTER — Ambulatory Visit (INDEPENDENT_AMBULATORY_CARE_PROVIDER_SITE_OTHER): Payer: Medicare PPO | Admitting: Urology

## 2022-05-22 VITALS — BP 122/70 | HR 78 | Ht 63.0 in | Wt 130.0 lb

## 2022-05-22 DIAGNOSIS — R350 Frequency of micturition: Secondary | ICD-10-CM | POA: Diagnosis not present

## 2022-05-22 DIAGNOSIS — Z1231 Encounter for screening mammogram for malignant neoplasm of breast: Secondary | ICD-10-CM | POA: Insufficient documentation

## 2022-05-22 DIAGNOSIS — R3129 Other microscopic hematuria: Secondary | ICD-10-CM | POA: Diagnosis not present

## 2022-05-22 NOTE — Progress Notes (Signed)
05/22/2022 2:11 PM   Alison Bradshaw 1940/11/01 [id number]  Referring provider: Crecencio Mc, MD [place],  [place] [address]  Chief Complaint  Patient presents with   Follow-up    HPI: 82 y.o. female 1 month follow-up.  Initially seen 04/18/2022 Onset intermittent left lower quadrant abdominal pain late November 2023 initially felt consistent with diverticulitis.  Has had associated urinary frequency, urgency and pelvic heaviness.  The discomfort radiates to the left lower quadrant to the costal margin She has several evaluations including GI, GYN, general surgery and vascular surgery and a definite etiology has not been identified Several CT scans have not shown urologic abnormality She had 1 UA 11/27 which showed microhematuria and pyuria however there were significant squamous epithelial cells indicating a contaminated specimen and the urine culture was negative She states over the past week her symptoms have significantly improved and are less severe and less frequent Denies dysuria or gross hematuria  Interval history: States her pain has actually improved and PCP leaning more towards a GI etiology Voiding symptoms have improved.  No longer taking Gemtesa   PMH: Past Medical History:  Diagnosis Date   Anxiety    Asthma    Atypical chest pain    Barrett's esophagus    Chronic gastritis    Diverticulosis    Duodenal ulcer    Gastritis    GERD (gastroesophageal reflux disease)    Hiatal hernia    History of echocardiogram    a. 08/2020 Echo: EF 60-65%, no rwma, Nl RV fxn, mild MR.   Hyperlipidemia    Non-obstructive CAD (coronary artery disease)    a. 10/2017 Stress Echo: Nl EF, no ischemia. Mild MR; b. 12/2017 Cardiac CTA: LM nl, LAD 50p, 30d, D1/2 nl, RI nl, LCX nl, OM1/2 nl, RCA nl, RPDA/RPL nl. Ca2+ score = 39 (46th percentile)-->statin dose escalated; c. 10/2021 MV: No ischemia/infarct. Nl LV fxn.   Osteopenia    Peptic ulcer, site  unspecified, unspecified as acute or chronic, without hemorrhage or perforation 11/19/2012   Formatting of this note might be different from the original.  duodenum 1968  Formatting of this note might be different from the original.  Overview:   duodenum 1968   PSVT (paroxysmal supraventricular tachycardia)    a. 10/2017 Holter: PSVT, max 5 beats->did not tolerate beta blocker; b. 09/2020 Zio: 38 runs of SVT. Longest 18.1 secs (106), fastest 203 bpm (6 beats). Occas PVCs (1.1%) - some assoc w/ triggered events.   Pulmonary nodules    a. 12/2017 RLL 29m nodule noted on Cardiac CTA; b. 04/2018 RLL 957mnodule resolved; c. 09/2018 CT Chest: Previously noted posterior RUL nodules almost completely resolved. Bandlike scarring of RML and lingula-->consistent w/ improved atypical infxt.   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    Surgical History: Past Surgical History:  Procedure Laterality Date   BREAST BIOPSY Left 2008   neg   BREAST EXCISIONAL BIOPSY Left 2004   neg   CATARACT EXTRACTION W/ INTRAOCULAR LENS  IMPLANT, BILATERAL Bilateral    COLONOSCOPY WITH PROPOFOL N/A 01/22/2017   Procedure: COLONOSCOPY WITH PROPOFOL;  Surgeon: Toledo, TeBenay PikeMD;  Location: [facility] ENDOSCOPY;  Service: Gastroenterology;  Laterality: N/A;   COLONOSCOPY WITH PROPOFOL N/A 03/26/2022   Procedure: COLONOSCOPY WITH PROPOFOL;  Surgeon: LoLesly RubensteinMD;  Location: [facility] ENDOSCOPY;  Service: Endoscopy;  Laterality: N/A;   ESOPHAGOGASTRODUODENOSCOPY (  EGD) WITH PROPOFOL N/A 01/20/2015   Procedure: ESOPHAGOGASTRODUODENOSCOPY (EGD) WITH PROPOFOL;  Surgeon: Lollie Sails, MD;  Location: [facility] ENDOSCOPY;  Service: Endoscopy;  Laterality: N/A;   ESOPHAGOGASTRODUODENOSCOPY (EGD) WITH PROPOFOL N/A 01/22/2017   Procedure: ESOPHAGOGASTRODUODENOSCOPY (EGD) WITH PROPOFOL;  Surgeon: Toledo, Benay Pike, MD;  Location: [facility] ENDOSCOPY;  Service:  Gastroenterology;  Laterality: N/A;   ESOPHAGOGASTRODUODENOSCOPY (EGD) WITH PROPOFOL N/A 04/14/2018   Procedure: ESOPHAGOGASTRODUODENOSCOPY (EGD) WITH PROPOFOL;  Surgeon: Lollie Sails, MD;  Location: [facility] ENDOSCOPY;  Service: Endoscopy;  Laterality: N/A;   ESOPHAGOGASTRODUODENOSCOPY (EGD) WITH PROPOFOL N/A 10/16/2021   Procedure: ESOPHAGOGASTRODUODENOSCOPY (EGD) WITH PROPOFOL;  Surgeon: Lesly Rubenstein, MD;  Location: [facility] ENDOSCOPY;  Service: Endoscopy;  Laterality: N/A;   EYE SURGERY Right    laser scraping   OPEN REDUCTION INTERNAL FIXATION (ORIF) DISTAL RADIAL FRACTURE Right 02/09/2018   Procedure: OPEN REDUCTION INTERNAL FIXATION (ORIF) DISTAL RADIAL FRACTURE;  Surgeon: Hessie Knows, MD;  Location: [facility] ORS;  Service: Orthopedics;  Laterality: Right;   TONSILLECTOMY      Home Medications:  Allergies as of 05/22/2022       Reactions   Percocet [oxycodone-acetaminophen] Nausea Only   Ceftin [cefuroxime Axetil] Hives, Rash   Timentin [ticarcillin-pot Clavulanate] Hives, Rash        Medication List        Accurate as of May 22, 2022  2:11 PM. If you have any questions, ask your nurse or doctor.          STOP taking these medications    ALPRAZolam 0.25 MG tablet Commonly known as: Duanne Moron Stopped by: Abbie Sons, MD   gabapentin 300 MG capsule Commonly known as: Neurontin Stopped by: Abbie Sons, MD       TAKE these medications    acetaminophen 500 MG tablet Commonly known as: TYLENOL Take 1,000 mg by mouth every 6 (six) hours as needed.   albuterol 108 (90 Base) MCG/ACT inhaler Commonly known as: VENTOLIN HFA Inhale 2 puffs into the lungs every 6 (six) hours as needed for shortness of breath.   dicyclomine 20 MG tablet Commonly known as: BENTYL Take 1 tablet (20 mg total) by mouth every 6 (six) hours.   dicyclomine 10 MG capsule Commonly known as: BENTYL Take 1 capsule (10 mg total) by mouth 4 (four) times daily -  before meals and at  bedtime for 10 days.   fluticasone 50 MCG/ACT nasal spray Commonly known as: FLONASE Place 2 sprays into both nostrils daily.   Gemtesa 75 MG Tabs Generic drug: Vibegron Take 75 mg by mouth daily.   pantoprazole 40 MG tablet Commonly known as: PROTONIX Take 40 mg by mouth daily.   polyethylene glycol 236 g solution Commonly known as: GoLYTELY Drink 8 ounces every 30 minutes until constipation is relieved   rosuvastatin 10 MG tablet Commonly known as: CRESTOR Take 10 mg by mouth daily.   sucralfate 1 g tablet Commonly known as: CARAFATE Take 1 g by mouth in the morning, at noon, and at bedtime. Pt only takes when she's really needs it which isn't very often        Allergies:  Allergies  Allergen Reactions   Percocet [Oxycodone-Acetaminophen] Nausea Only   Ceftin [Cefuroxime Axetil] Hives and Rash   Timentin [Ticarcillin-Pot Clavulanate] Hives and Rash    Family History: Family History  Problem Relation Age of Onset   Hyperlipidemia Mother    Alzheimer's disease Mother    Emphysema Father    AAA (abdominal aortic aneurysm) Father  Kidney Stones Father    Kidney cancer Brother    Pancreatic cancer Paternal Uncle    Diabetes Paternal Uncle    Stomach cancer Neg Hx    Colon cancer Neg Hx    Esophageal cancer Neg Hx     Social History:  reports that she has quit smoking. Her smoking use included cigarettes. She has never used smokeless tobacco. She reports current alcohol use of about 2.0 standard drinks of alcohol per week. She reports that she does not use drugs.   Physical Exam: BP 122/70   Pulse 78   Ht 5' 3"$  (1.6 m)   Wt 130 lb (59 kg)   BMI 23.03 kg/m   Constitutional:  Alert and oriented, No acute distress. HEENT: [place] AT Respiratory: Normal respiratory effort, no increased work of breathing. Skin: No rashes, bruises or suspicious lesions. Neurologic: Grossly intact, no focal deficits, moving all 4 extremities. Psychiatric: Normal mood and  affect.  Laboratory Data:  Urinalysis 3-10 RBC on microscopy   Pertinent Imaging: CT images were prepped on the reviewed and interpreted  Assessment & Plan:   Overall her pain and voiding symptoms have improved UA today with microhematuria.  She has had a negative CT abdomen pelvis with contrast Recommend cystoscopy for lower tract evaluation.  Should she have persistent/worsening microhematuria may need CT urogram in the future   Abbie Sons, MD  [place] [address] [place], [place] [address] [phone]

## 2022-05-23 LAB — URINALYSIS, COMPLETE
Bilirubin, UA: NEGATIVE
Glucose, UA: NEGATIVE
Ketones, UA: NEGATIVE
Nitrite, UA: NEGATIVE
Protein,UA: NEGATIVE
Specific Gravity, UA: 1.01 (ref 1.005–1.030)
Urobilinogen, Ur: 0.2 mg/dL (ref 0.2–1.0)
pH, UA: 5.5 (ref 5.0–7.5)

## 2022-05-23 LAB — MICROSCOPIC EXAMINATION

## 2022-06-07 ENCOUNTER — Ambulatory Visit: Payer: Medicare PPO | Admitting: Urology

## 2022-06-07 ENCOUNTER — Encounter: Payer: Self-pay | Admitting: Urology

## 2022-06-07 VITALS — Ht 63.0 in | Wt 135.0 lb

## 2022-06-07 DIAGNOSIS — R102 Pelvic and perineal pain: Secondary | ICD-10-CM

## 2022-06-07 DIAGNOSIS — R3129 Other microscopic hematuria: Secondary | ICD-10-CM | POA: Diagnosis not present

## 2022-06-07 NOTE — Progress Notes (Signed)
   06/07/22  CC:  Chief Complaint  Patient presents with   Cysto    HPI: Refer to prior office note 05/22/2022  Height '5\' 3"'$  (1.6 m), weight 135 lb (61.2 kg). NED. A&Ox3.   No respiratory distress   Abd soft, NT, ND Normal external genitalia with patent urethral meatus  Cystoscopy Procedure Note  Patient identification was confirmed, informed consent was obtained, and patient was prepped using Betadine solution.  Lidocaine jelly was administered per urethral meatus.    Procedure: - Flexible cystoscope introduced, without any difficulty.   - Thorough search of the bladder revealed:    normal urethral meatus    normal urothelium    no stones    no ulcers     no tumors    no urethral polyps    no trabeculation  - Ureteral orifices were normal in position and appearance.  Post-Procedure: - Patient tolerated the procedure well  Assessment/ Plan: No lower urinary tract abnormalities on cystoscopy No findings which would account for her left lower quadrant abdominal pain Follow-up prn    Abbie Sons, MD

## 2022-06-19 ENCOUNTER — Ambulatory Visit: Payer: Medicare PPO | Admitting: Internal Medicine

## 2022-06-19 ENCOUNTER — Encounter: Payer: Self-pay | Admitting: Internal Medicine

## 2022-06-19 VITALS — BP 106/58 | HR 56 | Ht 63.0 in | Wt 135.0 lb

## 2022-06-19 DIAGNOSIS — R419 Unspecified symptoms and signs involving cognitive functions and awareness: Secondary | ICD-10-CM | POA: Diagnosis not present

## 2022-06-19 DIAGNOSIS — Z780 Asymptomatic menopausal state: Secondary | ICD-10-CM

## 2022-06-19 DIAGNOSIS — E7800 Pure hypercholesterolemia, unspecified: Secondary | ICD-10-CM

## 2022-06-19 DIAGNOSIS — F411 Generalized anxiety disorder: Secondary | ICD-10-CM

## 2022-06-19 DIAGNOSIS — K580 Irritable bowel syndrome with diarrhea: Secondary | ICD-10-CM

## 2022-06-19 DIAGNOSIS — K638219 Small intestinal bacterial overgrowth, unspecified: Secondary | ICD-10-CM

## 2022-06-19 MED ORDER — BUSPIRONE HCL 10 MG PO TABS: 10.0000 mg | ORAL_TABLET | Freq: Two times a day (BID) | ORAL | 2 refills | Status: DC | PRN

## 2022-06-19 NOTE — Progress Notes (Signed)
Subjective:  Patient ID: Alison Bradshaw, female    DOB: [birth year]  Age: 82 y.o. MRN: [id number]  CC: The primary encounter diagnosis was Postmenopausal estrogen deficiency. Diagnoses of Pure hypercholesterolemia, Cognitive complaints, Small intestinal bacterial overgrowth, and Irritable bowel syndrome with diarrhea were also pertinent to this visit.   HPI Alison Bradshaw presents for follow up on multiple chronic issues.  Chief Complaint  Patient presents with   Medical Management of Chronic Issues    LLQ pain:  she is happy to report that her pain has been  resolved for the past  3 weeks, since starting Citrucel .as a daily supplement to manage chronic constipation.  He is having daily BMs.  We reviewed her comprehensive GI workup including CT scans,  endoscopies and recent  SIBO test which was negative   Cognitive concerns:    she  is concerned about her memory because she frequently walks into a room and loses her train of thought.  She admits to feeling anxious about her health and the health of several family members and friends .  She continues to read voraciously and play the piano both privately and in intimate concert settings in [place] with no trouble  remembering the music.    Marland Kitchen    Outpatient Medications Prior to Visit  Medication Sig Dispense Refill   acetaminophen (TYLENOL) 500 MG tablet Take 1,000 mg by mouth every 6 (six) hours as needed.     albuterol (PROVENTIL HFA;VENTOLIN HFA) 108 (90 Base) MCG/ACT inhaler Inhale 2 puffs into the lungs every 6 (six) hours as needed for shortness of breath. 3 Inhaler 0   clonazePAM (KLONOPIN) 0.25 MG disintegrating tablet Take 0.25 mg by mouth daily.     dicyclomine (BENTYL) 10 MG capsule Take 1 capsule (10 mg total) by mouth 4 (four) times daily -  before meals and at bedtime for 10 days. 40 capsule 0   fluticasone (FLONASE) 50 MCG/ACT nasal spray Place 2 sprays into both nostrils daily. 1 g 4   pantoprazole (PROTONIX) 40  MG tablet Take 40 mg by mouth daily.     rosuvastatin (CRESTOR) 10 MG tablet Take 10 mg by mouth daily.     sucralfate (CARAFATE) 1 G tablet Take 1 g by mouth in the morning, at noon, and at bedtime. Pt only takes when she's really needs it which isn't very often     polyethylene glycol (GOLYTELY) 236 g solution Drink 8 ounces every 30 minutes until constipation is relieved (Patient not taking: Reported on 06/19/2022) 4000 mL 0   No facility-administered medications prior to visit.    Review of Systems;  Patient denies headache, fevers, malaise, unintentional weight loss, skin rash, eye pain, sinus congestion and sinus pain, sore throat, dysphagia,  hemoptysis , cough, dyspnea, wheezing, chest pain, palpitations, orthopnea, edema, abdominal pain, nausea, melena, diarrhea, constipation, flank pain, dysuria, hematuria, urinary  Frequency, nocturia, numbness, tingling, seizures,  Focal weakness, Loss of consciousness,  Tremor, insomnia, depression,  and suicidal ideation.      Objective:  BP (!) 106/58   Pulse (!) 56   Ht 5\' 3"  (1.6 m)   Wt 135 lb (61.2 kg)   SpO2 96%   BMI 23.91 kg/m   BP Readings from Last 3 Encounters:  06/19/22 (!) 106/58  05/22/22 122/70  04/18/22 130/74    Wt Readings from Last 3 Encounters:  06/19/22 135 lb (61.2 kg)  06/07/22 135 lb (61.2 kg)  05/22/22 130 lb (59 kg)  Physical Exam Vitals reviewed.  Constitutional:      General: She is not in acute distress.    Appearance: Normal appearance. She is normal weight. She is not ill-appearing, toxic-appearing or diaphoretic.  HENT:     Head: Normocephalic.  Eyes:     General: No scleral icterus.       Right eye: No discharge.        Left eye: No discharge.     Conjunctiva/sclera: Conjunctivae normal.  Cardiovascular:     Rate and Rhythm: Normal rate and regular rhythm.     Heart sounds: Normal heart sounds.  Pulmonary:     Effort: Pulmonary effort is normal. No respiratory distress.     Breath  sounds: Normal breath sounds.  Musculoskeletal:        General: Normal range of motion.  Skin:    General: Skin is warm and dry.  Neurological:     General: No focal deficit present.     Mental Status: She is alert and oriented to person, place, and time. Mental status is at baseline.  Psychiatric:        Mood and Affect: Mood normal.        Behavior: Behavior normal.        Thought Content: Thought content normal.        Judgment: Judgment normal.    No results found for: "HGBA1C"  Lab Results  Component Value Date   CREATININE 0.77 04/07/2022   CREATININE 0.81 04/04/2022   CREATININE 0.66 04/02/2022    Lab Results  Component Value Date   WBC 4.5 04/07/2022   HGB 14.2 04/07/2022   HCT 42.0 04/07/2022   PLT PLATELET CLUMPS NOTED ON SMEAR, UNABLE TO ESTIMATE 04/07/2022   GLUCOSE 88 04/07/2022   ALT 11 04/07/2022   AST 33 04/07/2022   NA 137 04/07/2022   K 4.5 04/07/2022   CL 102 04/07/2022   CREATININE 0.77 04/07/2022   BUN 8 04/07/2022   CO2 26 04/07/2022    MM 3D SCREEN BREAST BILATERAL  Result Date: 05/23/2022 CLINICAL DATA:  Screening. EXAM: DIGITAL SCREENING BILATERAL MAMMOGRAM WITH TOMOSYNTHESIS AND CAD TECHNIQUE: Bilateral screening digital craniocaudal and mediolateral oblique mammograms were obtained. Bilateral screening digital breast tomosynthesis was performed. The images were evaluated with computer-aided detection. COMPARISON:  Previous exam(s). ACR Breast Density Category c: The breasts are heterogeneously dense, which may obscure small masses. FINDINGS: There are no findings suspicious for malignancy. IMPRESSION: No mammographic evidence of malignancy. A result letter of this screening mammogram will be mailed directly to the patient. RECOMMENDATION: Screening mammogram in one year. (Code:SM-B-01Y) BI-RADS CATEGORY  1: Negative. Electronically Signed   By: Kristopher Oppenheim M.D.   On: 05/23/2022 14:38    Assessment & Plan:  .Postmenopausal estrogen  deficiency  Pure hypercholesterolemia -     TSH; Future -     Comprehensive metabolic panel; Future -     Lipid panel; Future -     LDL cholesterol, direct; Future  Cognitive complaints -     B12 and Folate Panel; Future  Small intestinal bacterial overgrowth Assessment & Plan: Repeat testing in 2023 was normal.    Irritable bowel syndrome with diarrhea Assessment & Plan: Her pain and bowel irregularity has resolved with daily use of citrucel after a negative comprehensive diagnostic workup.    Other orders -     busPIRone HCl; Take 1 tablet (10 mg total) by mouth 2 (two) times daily as needed.  Dispense: 60 tablet; Refill: 2  I provided 333minutes of face-to-face time during this encounter reviewing patient's last visit with me, patient's  most recent visit with  gasgroenterology, recent surgical and non surgical procedures, previous  labs and imaging studies, counseling on management of IBS and anxiety, and post visit ordering of  diagnostics and therapeutics .   Follow-up: No follow-ups on file.   Crecencio Mc, MD

## 2022-06-19 NOTE — Patient Instructions (Addendum)
  I highly recommend reading "the End of Alzheimer's: The First Program to Prevent and Reverse Cognitive Decline"  by Brent Bulla, MD    I'll check your thyroid in June with your other labs that will be due   I am prescribing  Buspirone for prn use for anxiety instead of alprazolam so you can continue using clonazepam for insomnia   You can Send  the refill request for clonazepam to me in the future if you wish   Your last bone density was UNCHANGED  in May 2023;    We will repeat IN 2028

## 2022-06-22 DIAGNOSIS — F411 Generalized anxiety disorder: Secondary | ICD-10-CM | POA: Insufficient documentation

## 2022-06-22 NOTE — Assessment & Plan Note (Signed)
She prefers to continue use of low dose clonazepam at night but is requesting refill of alprazolam for prn daytime use,  which I have declined.  Trial of buspirone 10 mg  bid prn

## 2022-06-22 NOTE — Assessment & Plan Note (Signed)
Repeat testing in 2023 was normal.

## 2022-06-22 NOTE — Assessment & Plan Note (Signed)
Her pain and bowel irregularity has resolved with daily use of citrucel after a negative comprehensive diagnostic workup.

## 2022-08-16 ENCOUNTER — Other Ambulatory Visit: Payer: Self-pay | Admitting: Internal Medicine

## 2022-08-16 NOTE — Telephone Encounter (Signed)
Medications previously filled by a historical provider.   Last OV: 06/19/2022 Next OV: 08/29/2022

## 2022-08-16 NOTE — Addendum Note (Signed)
Addended by: Sandy Salaam on: 08/16/2022 01:04 PM   Modules accepted: Orders

## 2022-08-16 NOTE — Telephone Encounter (Signed)
Prescription Request  08/16/2022  LOV: 06/19/2022  What is the name of the medication or equipment? rosuvastatin (CRESTOR) 10 MG tablet, pantoprazole (PROTONIX) 40 MG tablet, and  /akorazikam 1.25 g.    Have you contacted your pharmacy to request a refill? No   Which pharmacy would you like this sent to?  [id number] - [place], [place] - [address] AT [facility] [address] [place] [address] Phone: [phone] Fax: [phone]    Patient notified that their request is being sent to the clinical staff for review and that they should receive a response within 2 business days.   Please advise at [facility] [phone]

## 2022-08-19 MED ORDER — PANTOPRAZOLE SODIUM 40 MG PO TBEC: 40.0000 mg | DELAYED_RELEASE_TABLET | Freq: Every day | ORAL | 3 refills | Status: DC

## 2022-08-19 MED ORDER — ROSUVASTATIN CALCIUM 10 MG PO TABS: 10.0000 mg | ORAL_TABLET | Freq: Every day | ORAL | 1 refills | Status: DC

## 2022-08-27 ENCOUNTER — Telehealth: Payer: Self-pay | Admitting: Internal Medicine

## 2022-08-27 ENCOUNTER — Other Ambulatory Visit (INDEPENDENT_AMBULATORY_CARE_PROVIDER_SITE_OTHER): Payer: Medicare PPO

## 2022-08-27 DIAGNOSIS — E7800 Pure hypercholesterolemia, unspecified: Secondary | ICD-10-CM

## 2022-08-27 DIAGNOSIS — R419 Unspecified symptoms and signs involving cognitive functions and awareness: Secondary | ICD-10-CM

## 2022-08-27 LAB — LIPID PANEL
Cholesterol: 164 mg/dL (ref 0–200)
HDL: 68 mg/dL (ref >39.00)
LDL Cholesterol: 85 mg/dL (ref 0–99)
NonHDL: 95.93
Total CHOL/HDL Ratio: 2
Triglycerides: 56 mg/dL (ref 0.0–149.0)
VLDL: 11.2 mg/dL (ref 0.0–40.0)

## 2022-08-27 LAB — COMPREHENSIVE METABOLIC PANEL
ALT: 12 U/L (ref 0–35)
AST: 25 U/L (ref 0–37)
Albumin: 4 g/dL (ref 3.5–5.2)
Alkaline Phosphatase: 75 U/L (ref 39–117)
BUN: 11 mg/dL (ref 6–23)
CO2: 31 mEq/L (ref 19–32)
Calcium: 9.4 mg/dL (ref 8.4–10.5)
Chloride: 101 mEq/L (ref 96–112)
Creatinine, Ser: 0.81 mg/dL (ref 0.40–1.20)
GFR: 68.81 mL/min (ref >60.00)
Glucose, Bld: 83 mg/dL (ref 70–99)
Potassium: 4.1 mEq/L (ref 3.5–5.1)
Sodium: 139 mEq/L (ref 135–145)
Total Bilirubin: 0.6 mg/dL (ref 0.2–1.2)
Total Protein: 6.6 g/dL (ref 6.0–8.3)

## 2022-08-27 LAB — TSH: TSH: 4.3 u[IU]/mL (ref 0.35–5.50)

## 2022-08-27 LAB — B12 AND FOLATE PANEL
Folate: >23.9 ng/mL (ref >5.9)
Vitamin B-12: 299 pg/mL (ref 211–911)

## 2022-08-27 LAB — LDL CHOLESTEROL, DIRECT: Direct LDL: 85 mg/dL

## 2022-08-27 NOTE — Telephone Encounter (Signed)
In an envelope placed on your desk

## 2022-08-27 NOTE — Telephone Encounter (Signed)
Patient dropped off a some notes for Dr Darrick Huntsman for her appt tomorrow. Envelope is up front in Dr Melina Schools color folder.

## 2022-08-28 ENCOUNTER — Other Ambulatory Visit: Payer: Self-pay | Admitting: Internal Medicine

## 2022-08-28 MED ORDER — ALPRAZOLAM 0.25 MG PO TABS: 0.2500 mg | ORAL_TABLET | Freq: Every evening | ORAL | 1 refills | Status: DC | PRN

## 2022-08-29 ENCOUNTER — Encounter: Payer: Self-pay | Admitting: Internal Medicine

## 2022-08-29 ENCOUNTER — Ambulatory Visit: Payer: Medicare PPO | Admitting: Internal Medicine

## 2022-08-29 VITALS — BP 122/72 | HR 67 | Ht 63.0 in | Wt 135.0 lb

## 2022-08-29 DIAGNOSIS — L820 Inflamed seborrheic keratosis: Secondary | ICD-10-CM | POA: Diagnosis not present

## 2022-08-29 DIAGNOSIS — J4520 Mild intermittent asthma, uncomplicated: Secondary | ICD-10-CM | POA: Diagnosis not present

## 2022-08-29 DIAGNOSIS — F5104 Psychophysiologic insomnia: Secondary | ICD-10-CM

## 2022-08-29 DIAGNOSIS — I774 Celiac artery compression syndrome: Secondary | ICD-10-CM

## 2022-08-29 DIAGNOSIS — E782 Mixed hyperlipidemia: Secondary | ICD-10-CM

## 2022-08-29 DIAGNOSIS — J449 Chronic obstructive pulmonary disease, unspecified: Secondary | ICD-10-CM

## 2022-08-29 DIAGNOSIS — K580 Irritable bowel syndrome with diarrhea: Secondary | ICD-10-CM

## 2022-08-29 DIAGNOSIS — J8410 Pulmonary fibrosis, unspecified: Secondary | ICD-10-CM

## 2022-08-29 MED ORDER — ALBUTEROL SULFATE HFA 108 (90 BASE) MCG/ACT IN AERS: 2.0000 | INHALATION_SPRAY | Freq: Four times a day (QID) | RESPIRATORY_TRACT | 0 refills | Status: DC | PRN

## 2022-08-29 NOTE — Assessment & Plan Note (Addendum)
By PFTs  donein 2019.   Bronchiectass noted on Jan 2020 CT (reviewd by Sung Amabile)   Prn use of albuterol.  Refills given

## 2022-08-29 NOTE — Progress Notes (Signed)
Subjective:  Patient ID: Alison Bradshaw, female    DOB: 30-Jul-1940  Age: 82 y.o. MRN: [id number]  CC: The primary encounter diagnosis was Seborrheic keratoses, inflamed. Diagnoses of Mild intermittent asthma, unspecified whether complicated, Mixed hyperlipidemia, Chronic obstructive pulmonary disease, unspecified COPD type (HCC), Median arcuate ligament syndrome (HCC), Granulomatous lung disease (HCC), Irritable bowel syndrome with diarrhea, and Psychophysiological insomnia were also pertinent to this visit.   HPI Alison Bradshaw presents for  Chief Complaint  Patient presents with   Medical Management of Chronic Issues   1) Chronic insomnia :  has tried clonazepam, imipramine.  And alprazolam initially prescribed by GI for anxiety and recurrent "nervous stomach" which caused her to vomit at night   2) HL : taking crestor   3) GER D: taking protonix   4) Abd pain resolved with resolution of constipation .  using citrucel   5) irritated sk on left side . Wants to see derm  6) recurrent episodes of low energy.  Wore a heart monitor last year.  Irregular rhythm (SVT) detected but treatment deferred given baseline PFT Also uses proair,  previously  PFTS were normal per patient  (  Outpatient Medications Prior to Visit  Medication Sig Dispense Refill   acetaminophen (TYLENOL) 500 MG tablet Take 1,000 mg by mouth every 6 (six) hours as needed.     ALPRAZolam (XANAX) 0.25 MG tablet Take 1 tablet (0.25 mg total) by mouth at bedtime as needed for anxiety. 30 tablet 1   fluticasone (FLONASE) 50 MCG/ACT nasal spray Place 2 sprays into both nostrils daily. 1 g 4   pantoprazole (PROTONIX) 40 MG tablet Take 1 tablet (40 mg total) by mouth daily. 90 tablet 3   rosuvastatin (CRESTOR) 10 MG tablet Take 1 tablet (10 mg total) by mouth daily. 90 tablet 1   sucralfate (CARAFATE) 1 G tablet Take 1 g by mouth in the morning, at noon, and at bedtime. Pt only takes when she's really needs it which  isn't very often     albuterol (PROVENTIL HFA;VENTOLIN HFA) 108 (90 Base) MCG/ACT inhaler Inhale 2 puffs into the lungs every 6 (six) hours as needed for shortness of breath. 3 Inhaler 0   busPIRone (BUSPAR) 10 MG tablet Take 1 tablet (10 mg total) by mouth 2 (two) times daily as needed. 60 tablet 2   dicyclomine (BENTYL) 10 MG capsule Take 1 capsule (10 mg total) by mouth 4 (four) times daily -  before meals and at bedtime for 10 days. 40 capsule 0   No facility-administered medications prior to visit.    Review of Systems;  Patient denies headache, fevers, malaise, unintentional weight loss, skin rash, eye pain, sinus congestion and sinus pain, sore throat, dysphagia,  hemoptysis , cough, dyspnea, wheezing, chest pain, palpitations, orthopnea, edema, abdominal pain, nausea, melena, diarrhea, constipation, flank pain, dysuria, hematuria, urinary  Frequency, nocturia, numbness, tingling, seizures,  Focal weakness, Loss of consciousness,  Tremor, insomnia, depression, anxiety, and suicidal ideation.      Objective:  BP 122/72   Pulse 67   Ht 5\' 3"  (1.6 m)   Wt 135 lb (61.2 kg)   SpO2 98%   BMI 23.91 kg/m   BP Readings from Last 3 Encounters:  08/29/22 122/72  06/19/22 (!) 106/58  05/22/22 122/70    Wt Readings from Last 3 Encounters:  08/29/22 135 lb (61.2 kg)  06/19/22 135 lb (61.2 kg)  06/07/22 135 lb (61.2 kg)    Physical Exam Vitals reviewed.  Constitutional:      General: She is not in acute distress.    Appearance: Normal appearance. She is normal weight. She is not ill-appearing, toxic-appearing or diaphoretic.  HENT:     Head: Normocephalic.  Eyes:     General: No scleral icterus.       Right eye: No discharge.        Left eye: No discharge.     Conjunctiva/sclera: Conjunctivae normal.  Cardiovascular:     Rate and Rhythm: Normal rate and regular rhythm.     Heart sounds: Normal heart sounds.  Pulmonary:     Effort: Pulmonary effort is normal. No respiratory  distress.     Breath sounds: Normal breath sounds.  Musculoskeletal:        General: Normal range of motion.  Skin:    General: Skin is warm and dry.  Neurological:     General: No focal deficit present.     Mental Status: She is alert and oriented to person, place, and time. Mental status is at baseline.  Psychiatric:        Mood and Affect: Mood normal.        Behavior: Behavior normal.        Thought Content: Thought content normal.        Judgment: Judgment normal.    No results found for: "HGBA1C"  Lab Results  Component Value Date   CREATININE 0.81 08/27/2022   CREATININE 0.77 04/07/2022   CREATININE 0.81 04/04/2022    Lab Results  Component Value Date   WBC 4.5 04/07/2022   HGB 14.2 04/07/2022   HCT 42.0 04/07/2022   PLT PLATELET CLUMPS NOTED ON SMEAR, UNABLE TO ESTIMATE 04/07/2022   GLUCOSE 83 08/27/2022   CHOL 164 08/27/2022   TRIG 56.0 08/27/2022   HDL 68.00 08/27/2022   LDLDIRECT 85.0 08/27/2022   LDLCALC 85 08/27/2022   ALT 12 08/27/2022   AST 25 08/27/2022   NA 139 08/27/2022   K 4.1 08/27/2022   CL 101 08/27/2022   CREATININE 0.81 08/27/2022   BUN 11 08/27/2022   CO2 31 08/27/2022   TSH 4.30 08/27/2022    MM 3D SCREEN BREAST BILATERAL  Result Date: 05/23/2022 CLINICAL DATA:  Screening. EXAM: DIGITAL SCREENING BILATERAL MAMMOGRAM WITH TOMOSYNTHESIS AND CAD TECHNIQUE: Bilateral screening digital craniocaudal and mediolateral oblique mammograms were obtained. Bilateral screening digital breast tomosynthesis was performed. The images were evaluated with computer-aided detection. COMPARISON:  Previous exam(s). ACR Breast Density Category c: The breasts are heterogeneously dense, which may obscure small masses. FINDINGS: There are no findings suspicious for malignancy. IMPRESSION: No mammographic evidence of malignancy. A result letter of this screening mammogram will be mailed directly to the patient. RECOMMENDATION: Screening mammogram in one year.  (Code:SM-B-01Y) BI-RADS CATEGORY  1: Negative. Electronically Signed   By: Sande Brothers M.D.   On: 05/23/2022 14:38    Assessment & Plan:  .Seborrheic keratoses, inflamed Assessment & Plan: Referral to dermatology   Orders: -     Ambulatory referral to Dermatology  Mild intermittent asthma, unspecified whether complicated Assessment & Plan: By PFTs  donein 2019.   Bronchiectass noted on Jan 2020 CT (reviewd by Sung Amabile)   Prn use of albuterol.  Refills given     Mixed hyperlipidemia Assessment & Plan: lipid control important in reducing the progression of atherosclerotic disease. Continue statin therapy    Chronic obstructive pulmonary disease, unspecified COPD type (HCC)  Median arcuate ligament syndrome (HCC)  Granulomatous lung disease (HCC)  Irritable  bowel syndrome with diarrhea Assessment & Plan: Curret IBS symptoms are managed with daily  use of citrucel    Psychophysiological insomnia Assessment & Plan: She has failed multiple trials of medications .  Alprazolam haa been the most effective.  The risks and benefits of benzodiazepine use were discussed with patient today including excessive sedation leading to respiratory depression,  impaired thinking/driving, and addiction.  Patient was advised to avoid concurrent use with alcohol, to use medication only as needed and not to share with others  .     Other orders -     Albuterol Sulfate HFA; Inhale 2 puffs into the lungs every 6 (six) hours as needed for shortness of breath.  Dispense: 3 each; Refill: 0     Follow-up: Return in about 6 months (around 03/01/2023).   Sherlene Shams, MD

## 2022-08-29 NOTE — Patient Instructions (Signed)
I have refilled your "Proair"  inhaler   If your SVT becomes more frequent,  notify me and I will prescribed Inderal  Alprazolam  has been refilled.   I am  prescribing alprazolam to help you sleep .  Do not take if you are sleepy when you lie down,  but  perfectly ok to take 1/2 tablet at bedtime and the second half if you wake up by  3 AM and can't go back to sleep

## 2022-08-31 ENCOUNTER — Encounter: Payer: Self-pay | Admitting: Internal Medicine

## 2022-08-31 DIAGNOSIS — L820 Inflamed seborrheic keratosis: Secondary | ICD-10-CM | POA: Insufficient documentation

## 2022-08-31 DIAGNOSIS — I774 Celiac artery compression syndrome: Secondary | ICD-10-CM | POA: Insufficient documentation

## 2022-08-31 DIAGNOSIS — J449 Chronic obstructive pulmonary disease, unspecified: Secondary | ICD-10-CM | POA: Insufficient documentation

## 2022-08-31 DIAGNOSIS — G4700 Insomnia, unspecified: Secondary | ICD-10-CM | POA: Insufficient documentation

## 2022-08-31 NOTE — Assessment & Plan Note (Signed)
lipid control important in reducing the progression of atherosclerotic disease. Continue statin therapy  

## 2022-08-31 NOTE — Assessment & Plan Note (Signed)
Curret IBS symptoms are managed with daily  use of citrucel

## 2022-08-31 NOTE — Assessment & Plan Note (Signed)
She has failed multiple trials of medications .  Alprazolam haa been the most effective.  The risks and benefits of benzodiazepine use were discussed with patient today including excessive sedation leading to respiratory depression,  impaired thinking/driving, and addiction.  Patient was advised to avoid concurrent use with alcohol, to use medication only as needed and not to share with others  .

## 2022-08-31 NOTE — Assessment & Plan Note (Signed)
Referral to dermatology.  

## 2022-09-04 ENCOUNTER — Encounter: Payer: Self-pay | Admitting: Internal Medicine

## 2022-09-05 NOTE — Telephone Encounter (Signed)
Spoke with pt and scheduled her for an appt tomorrow with Kara Dies, NP.

## 2022-09-06 ENCOUNTER — Ambulatory Visit: Payer: Medicare PPO | Admitting: Nurse Practitioner

## 2022-09-06 ENCOUNTER — Encounter: Payer: Self-pay | Admitting: Nurse Practitioner

## 2022-09-06 VITALS — BP 110/64 | HR 51 | Temp 97.6°F | Ht 63.0 in | Wt 137.8 lb

## 2022-09-06 DIAGNOSIS — W57XXXA Bitten or stung by nonvenomous insect and other nonvenomous arthropods, initial encounter: Secondary | ICD-10-CM

## 2022-09-06 DIAGNOSIS — S70361A Insect bite (nonvenomous), right thigh, initial encounter: Secondary | ICD-10-CM | POA: Diagnosis not present

## 2022-09-06 HISTORY — DX: Insect bite (nonvenomous), right thigh, initial encounter: W57.XXXA

## 2022-09-06 HISTORY — DX: Insect bite (nonvenomous), right thigh, initial encounter: S70.361A

## 2022-09-06 MED ORDER — DOXYCYCLINE HYCLATE 100 MG PO TABS: 100.0000 mg | ORAL_TABLET | Freq: Two times a day (BID) | ORAL | 0 refills | Status: AC

## 2022-09-06 NOTE — Progress Notes (Unsigned)
Established Patient Office Visit  Subjective:  Patient ID: Alison Bradshaw, female    DOB: 1940-05-15  Age: 82 y.o. MRN: [id number]  CC:  Chief Complaint  Patient presents with   Tick Removal    Pt removed a tick 6 days ago that ad a white dot on its back pt states it was on the top of her right leg    HPI  Alison Bradshaw presents for tick bite 3 days ago on the groin area . She removed the tick on Wednesday. She is unsure how long the tick was attached to her body. The area slightly red and itchy. She is using Hydrocortisone cream.  She has h/o [place] spotty fever. Denies fever, chills,    HPI   Past Medical History:  Diagnosis Date   Anxiety    Asthma    Atypical chest pain    Barrett's esophagus    Chronic gastritis    Diverticulosis    Duodenal ulcer    Gastritis    GERD (gastroesophageal reflux disease)    Hiatal hernia    History of echocardiogram    a. 08/2020 Echo: EF 60-65%, no rwma, Nl RV fxn, mild MR.   Hyperlipidemia    Non-obstructive CAD (coronary artery disease)    a. 10/2017 Stress Echo: Nl EF, no ischemia. Mild MR; b. 12/2017 Cardiac CTA: LM nl, LAD 50p, 30d, D1/2 nl, RI nl, LCX nl, OM1/2 nl, RCA nl, RPDA/RPL nl. Ca2+ score = 39 (46th percentile)-->statin dose escalated; c. 10/2021 MV: No ischemia/infarct. Nl LV fxn.   Osteopenia    Peptic ulcer, site unspecified, unspecified as acute or chronic, without hemorrhage or perforation 11/19/2012   Formatting of this note might be different from the original.  duodenum 1968  Formatting of this note might be different from the original.  Overview:   duodenum 1966   PSVT (paroxysmal supraventricular tachycardia)    a. 10/2017 Holter: PSVT, max 5 beats->did not tolerate beta blocker; b. 09/2020 Zio: 38 runs of SVT. Longest 18.1 secs (106), fastest 203 bpm (6 beats). Occas PVCs (1.1%) - some assoc w/ triggered events.   Pulmonary nodules    a. 12/2017 RLL 9mm nodule noted on Cardiac CTA; b. 04/2018 RLL 9mm  nodule resolved; c. 09/2018 CT Chest: Previously noted posterior RUL nodules almost completely resolved. Bandlike scarring of RML and lingula-->consistent w/ improved atypical infxt.   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    Past Surgical History:  Procedure Laterality Date   BREAST BIOPSY Left 2008   neg   BREAST EXCISIONAL BIOPSY Left 2004   neg   CATARACT EXTRACTION W/ INTRAOCULAR LENS  IMPLANT, BILATERAL Bilateral    COLONOSCOPY WITH PROPOFOL N/A 01/22/2017   Procedure: COLONOSCOPY WITH PROPOFOL;  Surgeon: Toledo, Boykin Nearing, MD;  Location: [facility] ENDOSCOPY;  Service: Gastroenterology;  Laterality: N/A;   COLONOSCOPY WITH PROPOFOL N/A 03/26/2022   Procedure: COLONOSCOPY WITH PROPOFOL;  Surgeon: Regis Bill, MD;  Location: [facility] ENDOSCOPY;  Service: Endoscopy;  Laterality: N/A;   ESOPHAGOGASTRODUODENOSCOPY (EGD) WITH PROPOFOL N/A 01/20/2015   Procedure: ESOPHAGOGASTRODUODENOSCOPY (EGD) WITH PROPOFOL;  Surgeon: Christena Deem, MD;  Location: [facility] ENDOSCOPY;  Service: Endoscopy;  Laterality: N/A;   ESOPHAGOGASTRODUODENOSCOPY (EGD) WITH PROPOFOL N/A 01/22/2017   Procedure: ESOPHAGOGASTRODUODENOSCOPY (EGD) WITH  PROPOFOL;  Surgeon: Toledo, Boykin Nearing, MD;  Location: [facility] ENDOSCOPY;  Service: Gastroenterology;  Laterality: N/A;   ESOPHAGOGASTRODUODENOSCOPY (EGD) WITH PROPOFOL N/A 04/14/2018   Procedure: ESOPHAGOGASTRODUODENOSCOPY (EGD) WITH PROPOFOL;  Surgeon: Christena Deem, MD;  Location: [facility] ENDOSCOPY;  Service: Endoscopy;  Laterality: N/A;   ESOPHAGOGASTRODUODENOSCOPY (EGD) WITH PROPOFOL N/A 10/16/2021   Procedure: ESOPHAGOGASTRODUODENOSCOPY (EGD) WITH PROPOFOL;  Surgeon: Regis Bill, MD;  Location: [facility] ENDOSCOPY;  Service: Endoscopy;  Laterality: N/A;   EYE  SURGERY Right    laser scraping   OPEN REDUCTION INTERNAL FIXATION (ORIF) DISTAL RADIAL FRACTURE Right 02/09/2018   Procedure: OPEN REDUCTION INTERNAL FIXATION (ORIF) DISTAL RADIAL FRACTURE;  Surgeon: Kennedy Bucker, MD;  Location: [facility] ORS;  Service: Orthopedics;  Laterality: Right;   TONSILLECTOMY      Family History  Problem Relation Age of Onset   Hyperlipidemia Mother    Alzheimer's disease Mother    Emphysema Father    AAA (abdominal aortic aneurysm) Father    Kidney Stones Father    Kidney cancer Brother    Pancreatic cancer Paternal Uncle    Diabetes Paternal Uncle    Stomach cancer Neg Hx    Colon cancer Neg Hx    Esophageal cancer Neg Hx     Social History   Socioeconomic History   Marital status: Widowed    Spouse name: Not on file   Number of children: 2   Years of education: Not on file   Highest education level: Not on file  Occupational History   Occupation: retired  Tobacco Use   Smoking status: Former    Types: Cigarettes    Passive exposure: Past   Smokeless tobacco: Never   Tobacco comments:    Quit at age 17, smoked 1 pack a month   Vaping Use   Vaping Use: Never used  Substance and Sexual Activity   Alcohol use: Yes    Alcohol/week: 2.0 standard drinks of alcohol    Types: 2 Glasses of wine per week    Comment: weekly,none last 24 hrs   Drug use: No   Sexual activity: Not Currently    Partners: Male  Other Topics Concern   Not on file  Social History Narrative   no drug use no alcohol   Social Determinants of Health   Financial Resource Strain: Not on file  Food Insecurity: Not on file  Transportation Needs: Not on file  Physical Activity: Not on file  Stress: Not on file  Social Connections: Not on file  Intimate Partner Violence: Not on file     Outpatient Medications Prior to Visit  Medication Sig Dispense Refill   acetaminophen (TYLENOL) 500 MG tablet Take 1,000 mg by mouth every 6 (six) hours as needed.     albuterol  (VENTOLIN HFA) 108 (90 Base) MCG/ACT inhaler Inhale 2 puffs into the lungs every 6 (six) hours as needed for shortness of breath. 3 each 0   ALPRAZolam (XANAX) 0.25 MG tablet Take 1 tablet (0.25 mg total) by mouth at bedtime as needed for anxiety. 30 tablet 1   fluticasone (FLONASE) 50 MCG/ACT nasal spray Place 2 sprays into both nostrils daily. 1 g 4   pantoprazole (PROTONIX) 40 MG tablet Take 1 tablet (40 mg total) by mouth daily. 90 tablet 3   rosuvastatin (CRESTOR) 10 MG tablet Take 1 tablet (10 mg total) by mouth daily. 90 tablet 1   sucralfate (CARAFATE) 1 G tablet Take 1 g by mouth in the morning, at noon, and  at bedtime. Pt only takes when she's really needs it which isn't very often (Patient not taking: Reported on 09/06/2022)     No facility-administered medications prior to visit.    Allergies  Allergen Reactions   Percocet [Oxycodone-Acetaminophen] Nausea Only   Ceftin [Cefuroxime Axetil] Hives and Rash   Timentin [Ticarcillin-Pot Clavulanate] Hives and Rash    ROS Review of Systems    Objective:    Physical Exam  BP 110/64   Pulse (!) 51   Temp 97.6 F (36.4 C)   Ht 5\' 3"  (1.6 m)   Wt 137 lb 12.8 oz (62.5 kg)   SpO2 99%   BMI 24.41 kg/m  Wt Readings from Last 3 Encounters:  09/06/22 137 lb 12.8 oz (62.5 kg)  08/29/22 135 lb (61.2 kg)  06/19/22 135 lb (61.2 kg)     Health Maintenance  Topic Date Due   Medicare Annual Wellness (AWV)  Never done   Hepatitis C Screening  Never done   Pneumonia Vaccine 69+ Years old (2 of 2 - PPSV23 or PCV20) 05/10/2017   DTaP/Tdap/Td (2 - Td or Tdap) 04/08/2022   INFLUENZA VACCINE  11/07/2022   DEXA SCAN  Completed   Zoster Vaccines- Shingrix  Completed   HPV VACCINES  Aged Out   Colonoscopy  Discontinued   COVID-19 Vaccine  Discontinued    There are no preventive care reminders to display for this patient.  Lab Results  Component Value Date   TSH 4.30 08/27/2022   Lab Results  Component Value Date   WBC 4.5  04/07/2022   HGB 14.2 04/07/2022   HCT 42.0 04/07/2022   MCV 86.6 04/07/2022   PLT PLATELET CLUMPS NOTED ON SMEAR, UNABLE TO ESTIMATE 04/07/2022   Lab Results  Component Value Date   NA 139 08/27/2022   K 4.1 08/27/2022   CO2 31 08/27/2022   GLUCOSE 83 08/27/2022   BUN 11 08/27/2022   CREATININE 0.81 08/27/2022   BILITOT 0.6 08/27/2022   ALKPHOS 75 08/27/2022   AST 25 08/27/2022   ALT 12 08/27/2022   PROT 6.6 08/27/2022   ALBUMIN 4.0 08/27/2022   CALCIUM 9.4 08/27/2022   ANIONGAP 9 04/07/2022   GFR 68.81 08/27/2022   Lab Results  Component Value Date   CHOL 164 08/27/2022   Lab Results  Component Value Date   HDL 68.00 08/27/2022   Lab Results  Component Value Date   LDLCALC 85 08/27/2022   Lab Results  Component Value Date   TRIG 56.0 08/27/2022   Lab Results  Component Value Date   CHOLHDL 2 08/27/2022   No results found for: "HGBA1C"    Assessment & Plan:  Tick bite of right thigh, initial encounter Assessment & Plan: The area is erythematous  Will treat with doxycycline and continue using HCT for pruritus. Medication side effects discussed.    Other orders -     Doxycycline Hyclate; Take 1 tablet (100 mg total) by mouth 2 (two) times daily for 7 days.  Dispense: 14 tablet; Refill: 0    Follow-up: No follow-ups on file.   Kara Dies, NP

## 2022-09-06 NOTE — Assessment & Plan Note (Addendum)
The area is erythematous  Will treat with doxycycline and continue using HCT for pruritus. Medication side effects discussed.

## 2022-09-06 NOTE — Patient Instructions (Signed)
Rx sent to pharmacy. Keep [place] updated for any new symptoms.

## 2022-10-07 ENCOUNTER — Ambulatory Visit (INDEPENDENT_AMBULATORY_CARE_PROVIDER_SITE_OTHER): Payer: Medicare PPO | Admitting: *Deleted

## 2022-10-07 VITALS — Ht 63.5 in | Wt 138.0 lb

## 2022-10-07 DIAGNOSIS — Z0000 Encounter for general adult medical examination without abnormal findings: Secondary | ICD-10-CM

## 2022-10-07 NOTE — Patient Instructions (Signed)
Alison Bradshaw , Thank you for taking time to come for your Medicare Wellness Visit. I appreciate your ongoing commitment to your health goals. Please review the following plan we discussed and let me know if I can assist you in the future.   These are the goals we discussed:  Goals      Patient Stated     Wants to start back doing YOGA and exercise classes        This is a list of the screening recommended for you and due dates:  Health Maintenance  Topic Date Due   Pneumonia Vaccine (2 of 2 - PPSV23 or PCV20) 05/10/2017   DTaP/Tdap/Td vaccine (2 - Td or Tdap) 04/08/2022   Flu Shot  11/07/2022   Medicare Annual Wellness Visit  10/07/2023   DEXA scan (bone density measurement)  Completed   Zoster (Shingles) Vaccine  Completed   HPV Vaccine  Aged Out   Colon Cancer Screening  Discontinued   COVID-19 Vaccine  Discontinued    Advanced directives: Will bring to next visit  Conditions/risks identified: none  Next appointment: Follow up in one year for your annual wellness visit 10/08/23 @ 2:45.   Preventive Care 53 Years and Older, Female Preventive care refers to lifestyle choices and visits with your health care provider that can promote health and wellness. What does preventive care include? A yearly physical exam. This is also called an annual well check. Dental exams once or twice a year. Routine eye exams. Ask your health care provider how often you should have your eyes checked. Personal lifestyle choices, including: Daily care of your teeth and gums. Regular physical activity. Eating a healthy diet. Avoiding tobacco and drug use. Limiting alcohol use. Practicing safe sex. Taking low-dose aspirin every day. Taking vitamin and mineral supplements as recommended by your health care provider. What happens during an annual well check? The services and screenings done by your health care provider during your annual well check will depend on your age, overall health, lifestyle  risk factors, and family history of disease. Counseling  Your health care provider may ask you questions about your: Alcohol use. Tobacco use. Drug use. Emotional well-being. Home and relationship well-being. Sexual activity. Eating habits. History of falls. Memory and ability to understand (cognition). Work and work Astronomer. Reproductive health. Screening  You may have the following tests or measurements: Height, weight, and BMI. Blood pressure. Lipid and cholesterol levels. These may be checked every 5 years, or more frequently if you are over 19 years old. Skin check. Lung cancer screening. You may have this screening every year starting at age 66 if you have a 30-pack-year history of smoking and currently smoke or have quit within the past 15 years. Fecal occult blood test (FOBT) of the stool. You may have this test every year starting at age 11. Flexible sigmoidoscopy or colonoscopy. You may have a sigmoidoscopy every 5 years or a colonoscopy every 10 years starting at age 89. Hepatitis C blood test. Hepatitis B blood test. Sexually transmitted disease (STD) testing. Diabetes screening. This is done by checking your blood sugar (glucose) after you have not eaten for a while (fasting). You may have this done every 1-3 years. Bone density scan. This is done to screen for osteoporosis. You may have this done starting at age 49. Mammogram. This may be done every 1-2 years. Talk to your health care provider about how often you should have regular mammograms. Talk with your health care provider about your  test results, treatment options, and if necessary, the need for more tests. Vaccines  Your health care provider may recommend certain vaccines, such as: Influenza vaccine. This is recommended every year. Tetanus, diphtheria, and acellular pertussis (Tdap, Td) vaccine. You may need a Td booster every 10 years. Zoster vaccine. You may need this after age 34. Pneumococcal  13-valent conjugate (PCV13) vaccine. One dose is recommended after age 37. Pneumococcal polysaccharide (PPSV23) vaccine. One dose is recommended after age 10. Talk to your health care provider about which screenings and vaccines you need and how often you need them. This information is not intended to replace advice given to you by your health care provider. Make sure you discuss any questions you have with your health care provider. Document Released: 04/21/2015 Document Revised: 12/13/2015 Document Reviewed: 01/24/2015 Elsevier Interactive Patient Education  2017 [place] Prevention in the Home Falls can cause injuries. They can happen to people of all ages. There are many things you can do to make your home safe and to help prevent falls. What can I do on the outside of my home? Regularly fix the edges of walkways and driveways and fix any cracks. Remove anything that might make you trip as you walk through a door, such as a raised step or threshold. Trim any bushes or trees on the path to your home. Use bright outdoor lighting. Clear any walking paths of anything that might make someone trip, such as rocks or tools. Regularly check to see if handrails are loose or broken. Make sure that both sides of any steps have handrails. Any raised decks and porches should have guardrails on the edges. Have any leaves, snow, or ice cleared regularly. Use sand or salt on walking paths during winter. Clean up any spills in your garage right away. This includes oil or grease spills. What can I do in the bathroom? Use night lights. Install grab bars by the toilet and in the tub and shower. Do not use towel bars as grab bars. Use non-skid mats or decals in the tub or shower. If you need to sit down in the shower, use a plastic, non-slip stool. Keep the floor dry. Clean up any water that spills on the floor as soon as it happens. Remove soap buildup in the tub or shower regularly. Attach  bath mats securely with double-sided non-slip rug tape. Do not have throw rugs and other things on the floor that can make you trip. What can I do in the bedroom? Use night lights. Make sure that you have a light by your bed that is easy to reach. Do not use any sheets or blankets that are too big for your bed. They should not hang down onto the floor. Have a firm chair that has side arms. You can use this for support while you get dressed. Do not have throw rugs and other things on the floor that can make you trip. What can I do in the kitchen? Clean up any spills right away. Avoid walking on wet floors. Keep items that you use a lot in easy-to-reach places. If you need to reach something above you, use a strong step stool that has a grab bar. Keep electrical cords out of the way. Do not use floor polish or wax that makes floors slippery. If you must use wax, use non-skid floor wax. Do not have throw rugs and other things on the floor that can make you trip. What can I do with my  stairs? Do not leave any items on the stairs. Make sure that there are handrails on both sides of the stairs and use them. Fix handrails that are broken or loose. Make sure that handrails are as long as the stairways. Check any carpeting to make sure that it is firmly attached to the stairs. Fix any carpet that is loose or worn. Avoid having throw rugs at the top or bottom of the stairs. If you do have throw rugs, attach them to the floor with carpet tape. Make sure that you have a light switch at the top of the stairs and the bottom of the stairs. If you do not have them, ask someone to add them for you. What else can I do to help prevent falls? Wear shoes that: Do not have high heels. Have rubber bottoms. Are comfortable and fit you well. Are closed at the toe. Do not wear sandals. If you use a stepladder: Make sure that it is fully opened. Do not climb a closed stepladder. Make sure that both sides of the  stepladder are locked into place. Ask someone to hold it for you, if possible. Clearly mark and make sure that you can see: Any grab bars or handrails. First and last steps. Where the edge of each step is. Use tools that help you move around (mobility aids) if they are needed. These include: Canes. Walkers. Scooters. Crutches. Turn on the lights when you go into a dark area. Replace any light bulbs as soon as they burn out. Set up your furniture so you have a clear path. Avoid moving your furniture around. If any of your floors are uneven, fix them. If there are any pets around you, be aware of where they are. Review your medicines with your doctor. Some medicines can make you feel dizzy. This can increase your chance of falling. Ask your doctor what other things that you can do to help prevent falls. This information is not intended to replace advice given to you by your health care provider. Make sure you discuss any questions you have with your health care provider. Document Released: 01/19/2009 Document Revised: 08/31/2015 Document Reviewed: 04/29/2014 Elsevier Interactive Patient Education  2017 [employer].

## 2022-10-07 NOTE — Progress Notes (Addendum)
Subjective:   Alison Bradshaw is a 82 y.o. female who presents for an Initial Medicare Annual Wellness Visit.  Visit Complete: Virtual  I connected with  Alison Bradshaw on 10/07/22 by a audio enabled telemedicine application and verified that I am speaking with the correct person using two identifiers.  Patient Location: Home  Provider Location: Office/Clinic  I discussed the limitations of evaluation and management by telemedicine. The patient expressed understanding and agreed to proceed.  Review of Systems     Cardiac Risk Factors include: advanced age (>23men, >98 women);dyslipidemia     Objective:    Today's Vitals   10/07/22 1523  Weight: 138 lb (62.6 kg)  Height: 5' 3.5" (1.613 m)   Body mass index is 24.06 kg/m.     10/07/2022    3:39 PM 04/07/2022    2:29 PM 03/26/2022   11:32 AM 03/04/2022    7:13 PM 02/28/2022   12:01 AM 01/30/2022    3:26 PM 10/16/2021   12:27 PM  Advanced Directives  Does Patient Have a Medical Advance Directive? Yes No Yes No No Yes Yes  Type of Estate agent of [place];Living will  Healthcare Power of [place];Living will   Healthcare Power of [place];Living will;Out of facility DNR (pink MOST or yellow form) Healthcare Power of [place];Living will;Out of facility DNR (pink MOST or yellow form)  Does patient want to make changes to medical advance directive?      No - Patient declined   Copy of Healthcare Power of Attorney in Chart? No - copy requested  No - copy requested   No - copy requested No - copy requested  Would patient like information on creating a medical advance directive?     No - Patient declined      Current Medications (verified) Outpatient Encounter Medications as of 10/07/2022  Medication Sig   acetaminophen (TYLENOL) 500 MG tablet Take 1,000 mg by mouth every 6 (six) hours as needed.   albuterol (VENTOLIN HFA) 108 (90 Base) MCG/ACT inhaler Inhale 2 puffs into the lungs every 6 (six) hours  as needed for shortness of breath.   ALPRAZolam (XANAX) 0.25 MG tablet Take 1 tablet (0.25 mg total) by mouth at bedtime as needed for anxiety.   fluticasone (FLONASE) 50 MCG/ACT nasal spray Place 2 sprays into both nostrils daily.   pantoprazole (PROTONIX) 40 MG tablet Take 1 tablet (40 mg total) by mouth daily.   rosuvastatin (CRESTOR) 10 MG tablet Take 1 tablet (10 mg total) by mouth daily.   sucralfate (CARAFATE) 1 G tablet Take 1 g by mouth in the morning, at noon, and at bedtime. Pt only takes when she's really needs it which isn't very often (Patient not taking: Reported on 10/07/2022)   No facility-administered encounter medications on file as of 10/07/2022.    Allergies (verified) Percocet [oxycodone-acetaminophen], Ceftin [cefuroxime axetil], and Timentin [ticarcillin-pot clavulanate]   History: Past Medical History:  Diagnosis Date   Anxiety    Asthma    Atypical chest pain    Barrett's esophagus    Chronic gastritis    Diverticulosis    Duodenal ulcer    Gastritis    GERD (gastroesophageal reflux disease)    Hiatal hernia    History of echocardiogram    a. 08/2020 Echo: EF 60-65%, no rwma, Nl RV fxn, mild MR.   Hyperlipidemia    Non-obstructive CAD (coronary artery disease)    a. 10/2017 Stress Echo: Nl EF, no ischemia. Mild MR;  b. 12/2017 Cardiac CTA: LM nl, LAD 50p, 30d, D1/2 nl, RI nl, LCX nl, OM1/2 nl, RCA nl, RPDA/RPL nl. Ca2+ score = 39 (46th percentile)-->statin dose escalated; c. 10/2021 MV: No ischemia/infarct. Nl LV fxn.   Osteopenia    Peptic ulcer, site unspecified, unspecified as acute or chronic, without hemorrhage or perforation 11/19/2012   Formatting of this note might be different from the original.  duodenum 1966  Formatting of this note might be different from the original.  Overview:   duodenum 1966   PSVT (paroxysmal supraventricular tachycardia)    a. 10/2017 Holter: PSVT, max 5 beats->did not tolerate beta blocker; b. 09/2020 Zio: 38 runs of SVT. Longest  18.1 secs (106), fastest 203 bpm (6 beats). Occas PVCs (1.1%) - some assoc w/ triggered events.   Pulmonary nodules    a. 12/2017 RLL 9mm nodule noted on Cardiac CTA; b. 04/2018 RLL 9mm nodule resolved; c. 09/2018 CT Chest: Previously noted posterior RUL nodules almost completely resolved. Bandlike scarring of RML and lingula-->consistent w/ improved atypical infxt.   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   Past Surgical History:  Procedure Laterality Date   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   BREAST BIOPSY Left 2008   neg   BREAST EXCISIONAL BIOPSY Left 2004   neg   CATARACT EXTRACTION W/ INTRAOCULAR LENS  IMPLANT, BILATERAL Bilateral    COLONOSCOPY WITH PROPOFOL N/A 01/22/2017   Procedure: COLONOSCOPY WITH PROPOFOL;  Surgeon: Toledo, Boykin Nearing, MD;  Location: [facility] ENDOSCOPY;  Service: Gastroenterology;  Laterality: N/A;   COLONOSCOPY WITH PROPOFOL N/A 03/26/2022   Procedure: COLONOSCOPY WITH PROPOFOL;  Surgeon: Regis Bill, MD;  Location: [facility] ENDOSCOPY;  Service: Endoscopy;  Laterality: N/A;   ESOPHAGOGASTRODUODENOSCOPY (EGD) WITH PROPOFOL N/A 01/20/2015   Procedure: ESOPHAGOGASTRODUODENOSCOPY (EGD) WITH PROPOFOL;  Surgeon: Christena Deem, MD;  Location: [facility] ENDOSCOPY;  Service: Endoscopy;  Laterality: N/A;   ESOPHAGOGASTRODUODENOSCOPY (EGD) WITH PROPOFOL N/A 01/22/2017   Procedure: ESOPHAGOGASTRODUODENOSCOPY (EGD) WITH PROPOFOL;  Surgeon: Toledo, Boykin Nearing, MD;  Location: [facility] ENDOSCOPY;  Service: Gastroenterology;  Laterality: N/A;   ESOPHAGOGASTRODUODENOSCOPY (EGD) WITH PROPOFOL N/A 04/14/2018   Procedure: ESOPHAGOGASTRODUODENOSCOPY (EGD) WITH PROPOFOL;  Surgeon: Christena Deem, MD;  Location: [facility] ENDOSCOPY;  Service: Endoscopy;  Laterality: N/A;    ESOPHAGOGASTRODUODENOSCOPY (EGD) WITH PROPOFOL N/A 10/16/2021   Procedure: ESOPHAGOGASTRODUODENOSCOPY (EGD) WITH PROPOFOL;  Surgeon: Regis Bill, MD;  Location: [facility] ENDOSCOPY;  Service: Endoscopy;  Laterality: N/A;   EYE SURGERY Right    laser scraping   OPEN REDUCTION INTERNAL FIXATION (ORIF) DISTAL RADIAL FRACTURE Right 02/09/2018   Procedure: OPEN REDUCTION INTERNAL FIXATION (ORIF) DISTAL RADIAL FRACTURE;  Surgeon: Kennedy Bucker, MD;  Location: [facility] ORS;  Service: Orthopedics;  Laterality: Right;   TONSILLECTOMY     Family History  Problem Relation Age of Onset   Hyperlipidemia Mother    Alzheimer's disease Mother    Emphysema Father    AAA (abdominal aortic aneurysm) Father    Kidney Stones Father    Kidney cancer Brother    Pancreatic cancer Paternal Uncle    Diabetes Paternal Uncle    Stomach cancer Neg Hx    Colon cancer Neg Hx    Esophageal cancer Neg Hx    Social History   Socioeconomic History   Marital  status: Widowed    Spouse name: Not on file   Number of children: 2   Years of education: Not on file   Highest education level: Not on file  Occupational History   Occupation: retired  Tobacco Use   Smoking status: Former    Types: Cigarettes    Passive exposure: Past   Smokeless tobacco: Never   Tobacco comments:    Quit at age 58, smoked 1 pack a month   Vaping Use   Vaping Use: Never used  Substance and Sexual Activity   Alcohol use: Yes    Alcohol/week: 2.0 standard drinks of alcohol    Types: 2 Glasses of wine per week    Comment: weekly,none last 24 hrs   Drug use: No   Sexual activity: Not Currently    Partners: Male  Other Topics Concern   Not on file  Social History Narrative   no drug use no alcohol   Social Determinants of Health   Financial Resource Strain: Low Risk  (10/07/2022)   Overall Financial Resource Strain (CARDIA)    Difficulty of Paying Living Expenses: Not hard at all  Food Insecurity: No Food Insecurity (10/07/2022)    Hunger Vital Sign    Worried About Running Out of Food in the Last Year: Never true    Ran Out of Food in the Last Year: Never true  Transportation Needs: No Transportation Needs (10/07/2022)   PRAPARE - Administrator, Civil Service (Medical): No    Lack of Transportation (Non-Medical): No  Physical Activity: Sufficiently Active (10/07/2022)   Exercise Vital Sign    Days of Exercise per Week: 7 days    Minutes of Exercise per Session: 90 min  Stress: No Stress Concern Present (10/07/2022)   [employer] of Occupational Health - Occupational Stress Questionnaire    Feeling of Stress : Not at all  Social Connections: Moderately Integrated (10/07/2022)   Social Connection and Isolation Panel [NHANES]    Frequency of Communication with Friends and Family: More than three times a week    Frequency of Social Gatherings with Friends and Family: More than three times a week    Attends Religious Services: More than 4 times per year    Active Member of [employer] or Organizations: Yes    Attends Banker Meetings: More than 4 times per year    Marital Status: Widowed    Tobacco Counseling Counseling given: Not Answered Tobacco comments: Quit at age 52, smoked 1 pack a month    Clinical Intake:  Pre-visit preparation completed: Yes  Pain : No/denies pain     BMI - recorded: 24.06 Nutritional Status: BMI 25 -29 Overweight Nutritional Risks: None Diabetes: No  How often do you need to have someone help you when you read instructions, pamphlets, or other written materials from your doctor or pharmacy?: 1 - Never  Interpreter Needed?: No  Information entered by :: R. Kairi Tufo LPN   Activities of Daily Living    10/07/2022    3:25 PM  In your present state of health, do you have any difficulty performing the following activities:  Hearing? 0  Vision? 0  Difficulty concentrating or making decisions? 0  Walking or climbing stairs? 0  Dressing or bathing? 0  Doing  errands, shopping? 0  Preparing Food and eating ? N  Using the Toilet? N  In the past six months, have you accidently leaked urine? N  Do you have problems with loss of  bowel control? N  Managing your Medications? N  Managing your Finances? N  Housekeeping or managing your Housekeeping? N    Patient Care Team: Sherlene Shams, MD as PCP - General (Internal Medicine) Iran Ouch, MD as PCP - Cardiology (Cardiology)  Indicate any recent Medical Services you may have received from other than [facility] providers in the past year (date may be approximate).     Assessment:   This is a routine wellness examination for Infant.  Hearing/Vision screen Hearing Screening - Comments:: May need to have hearing checked Vision Screening - Comments:: glasses  Dietary issues and exercise activities discussed:     Goals Addressed             This Visit's Progress    Patient Stated       Wants to start back doing YOGA and exercise classes      Depression Screen    10/07/2022    3:32 PM 08/29/2022   12:01 PM 06/19/2022    3:16 PM 04/04/2022    1:22 PM 01/30/2022    3:25 PM  PHQ 2/9 Scores  PHQ - 2 Score 2 0 0 0 0  PHQ- 9 Score 3 2       Fall Risk    10/07/2022    3:43 PM 08/29/2022   12:01 PM 06/19/2022    3:16 PM 04/04/2022    1:22 PM 01/30/2022    3:25 PM  Fall Risk   Falls in the past year? 0 0 0 0 1  Number falls in past yr: 0 0 0  0  Injury with Fall? 0 0 0  0  Risk for fall due to : No Fall Risks No Fall Risks No Fall Risks No Fall Risks No Fall Risks  Follow up Falls prevention discussed;Falls evaluation completed  Falls evaluation completed Falls evaluation completed Falls evaluation completed    MEDICARE RISK AT HOME:  Medicare Risk at Home - 10/07/22 1543     Any stairs in or around the home? Yes    If so, are there any without handrails? Yes    Home free of loose throw rugs in walkways, pet beds, electrical cords, etc? Yes    Adequate lighting in your home to  reduce risk of falls? Yes    Life alert? No    Use of a cane, walker or w/c? No    Grab bars in the bathroom? Yes    Shower chair or bench in shower? Yes    Elevated toilet seat or a handicapped toilet? Yes              Cognitive Function:        10/07/2022    3:40 PM  6CIT Screen  What Year? 0 points  What month? 0 points  What time? 0 points  Count back from 20 0 points  Months in reverse 0 points  Repeat phrase 0 points  Total Score 0 points    Immunizations Immunization History  Administered Date(s) Administered   Fluad Quad(high Dose 65+) 04/04/2022   Influenza Inj Mdck Quad Pf 01/05/2018, 01/11/2021   Influenza, Seasonal, Injecte, Preservative Fre 12/15/2013   Influenza,inj,Quad PF,6+ Mos 01/04/2020   Influenza-Unspecified 12/07/2013   Moderna Sars-Covid-2 Vaccination 04/23/2019, 05/25/2019   Pneumococcal Conjugate-13 12/29/2015, 05/10/2016   Pneumococcal-Unspecified 07/07/2006   Tdap 04/08/2012   Zoster Recombinant(Shingrix) 01/20/2018, 05/04/2018, 10/21/2018   Zoster, Live 04/08/2009    TDAP status: Due, Education has been provided regarding the importance of  this vaccine. Advised may receive this vaccine at local pharmacy or Health Dept. Aware to provide a copy of the vaccination record if obtained from local pharmacy or Health Dept. Verbalized acceptance and understanding.  Flu Vaccine status: Up to date  Pneumococcal vaccine status: Up to date  Covid-19 vaccine status: Declined, Education has been provided regarding the importance of this vaccine but patient still declined. Advised may receive this vaccine at local pharmacy or Health Dept.or vaccine clinic. Aware to provide a copy of the vaccination record if obtained from local pharmacy or Health Dept. Verbalized acceptance and understanding. Had a reaction to the booster  Qualifies for Shingles Vaccine? Yes   Zostavax completed Yes   Shingrix Completed?: Yes  Screening Tests Health Maintenance   Topic Date Due   Pneumonia Vaccine 35+ Years old (2 of 2 - PPSV23 or PCV20) 05/10/2017   DTaP/Tdap/Td (2 - Td or Tdap) 04/08/2022   INFLUENZA VACCINE  11/07/2022   Medicare Annual Wellness (AWV)  10/07/2023   DEXA SCAN  Completed   Zoster Vaccines- Shingrix  Completed   HPV VACCINES  Aged Out   Colonoscopy  Discontinued   COVID-19 Vaccine  Discontinued    Health Maintenance  Health Maintenance Due  Topic Date Due   Pneumonia Vaccine 65+ Years old (2 of 2 - PPSV23 or PCV20) 05/10/2017   DTaP/Tdap/Td (2 - Td or Tdap) 04/08/2022    Last colonoscopy 12/23, has aged out. Per patient does not plan on having anymore  Mammogram status: No longer required due to age.  Bone Density status: Completed 5/23. Results reflect: Bone density results: OSTEOPENIA. Repeat every 2 years.  Lung Cancer Screening: (Low Dose CT Chest recommended if Age 29-80 years, 20 pack-year currently smoking OR have quit w/in 15years.) does not qualify.     Additional Screening:  Hepatitis C Screening: does not qualify;  Vision Screening: Recommended annual ophthalmology exams for early detection of glaucoma and other disorders of the eye. Is the patient up to date with their annual eye exam?  Yes  Who is the provider or what is the name of the office in which the patient attends annual eye exams?  [facility] If pt is not established with a provider, would they like to be referred to a provider to establish care? No .   Dental Screening: Recommended annual dental exams for proper oral hygiene   Community Resource Referral / Chronic Care Management: CRR required this visit?  No   CCM required this visit?  No     Plan:     I have personally reviewed and noted the following in the patient's chart:   Medical and social history Use of alcohol, tobacco or illicit drugs  Current medications and supplements including opioid prescriptions. Patient is not currently taking opioid  prescriptions. Functional ability and status Nutritional status Physical activity Advanced directives List of other physicians Hospitalizations, surgeries, and ER visits in previous 12 months Vitals Screenings to include cognitive, depression, and falls Referrals and appointments  In addition, I have reviewed and discussed with patient certain preventive protocols, quality metrics, and best practice recommendations. A written personalized care plan for preventive services as well as general preventive health recommendations were provided to patient.     Sydell Axon, LPN   12/12/452   After Visit Summary: (MyChart) Due to this being a telephonic visit, the after visit summary with patients personalized plan was offered to patient via MyChart   Nurse Notes: None     I  have reviewed the above information and agree with above.   Duncan Dull, MD

## 2022-10-20 IMAGING — CT CT ABD-PELV W/ CM
2 of 5 series · 15 of 46 positions shown, 17 images · IV contrast (APPLIED)
Comparison: Chest radiograph dated 09/18/2021, CT dated 05/10/2019
and CT abdomen pelvis dated 03/29/2016.

CLINICAL DATA: Shortness of breath and abdominal pain.



[Series 2: axial st · axial · 0.76mm/px · z∈[-798,-393]mm · 12 of 93 slices shown, 14 images]
[im 6/93  soft-tissue]
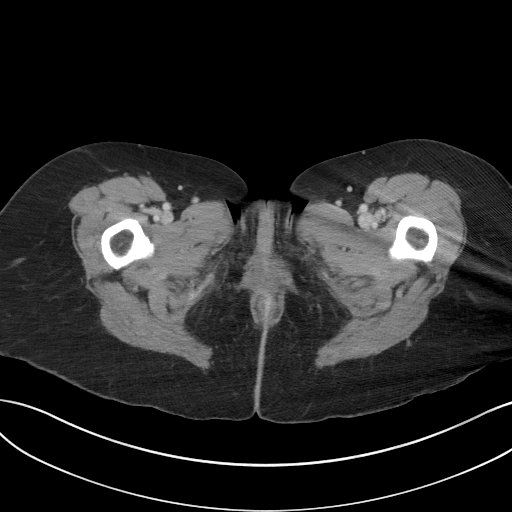
[im 6/93  bone]
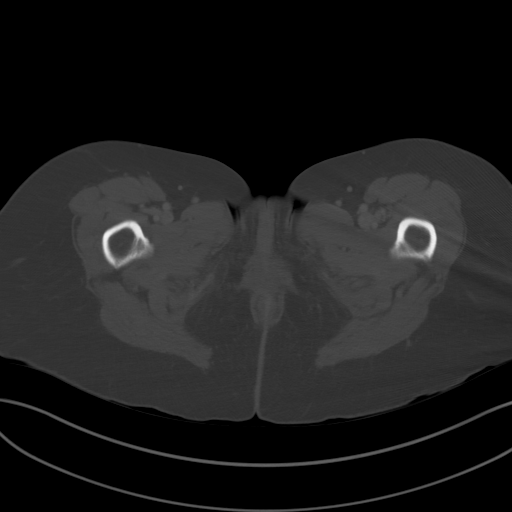
[im 16/93  soft-tissue]
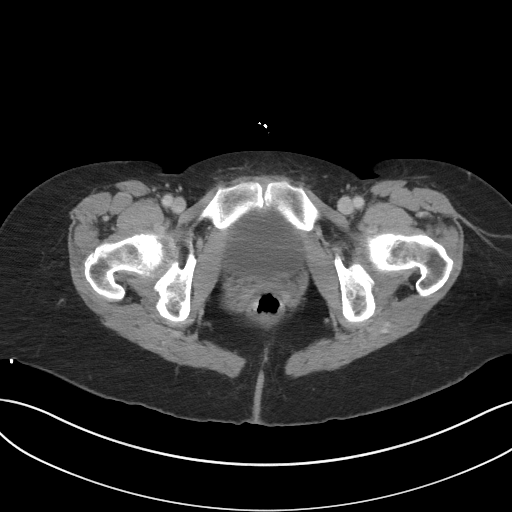
[im 21/93  soft-tissue]
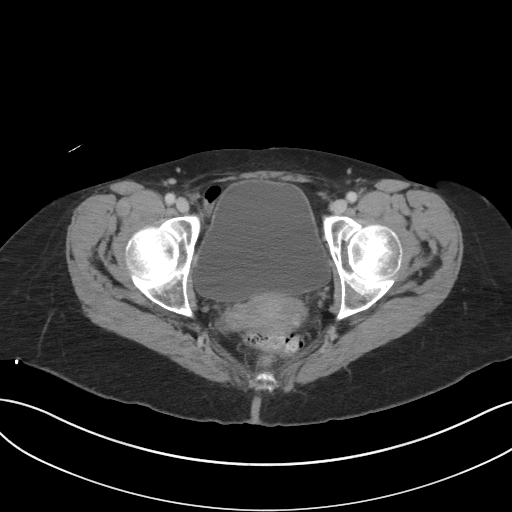
[im 26/93  soft-tissue]
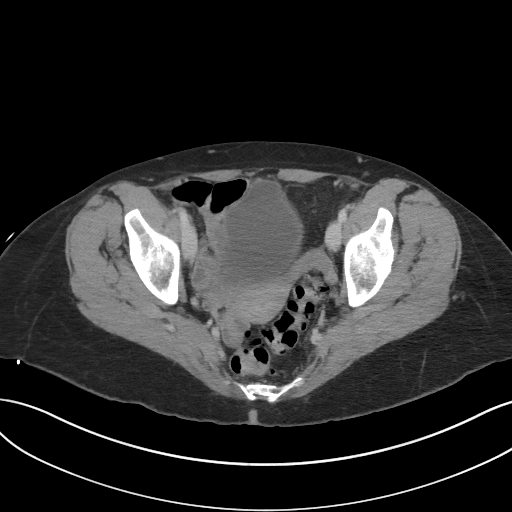
[im 36/93  soft-tissue]
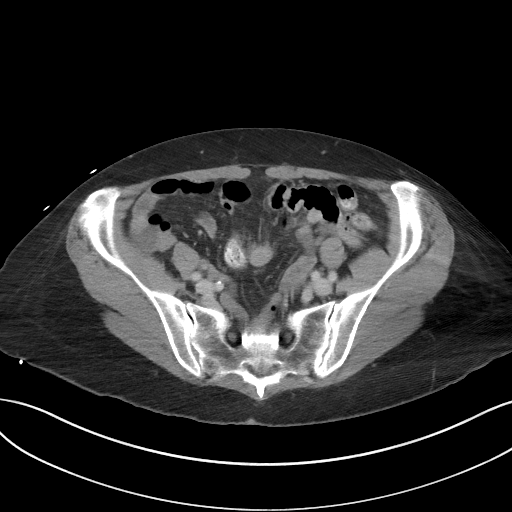
[im 41/93  soft-tissue]
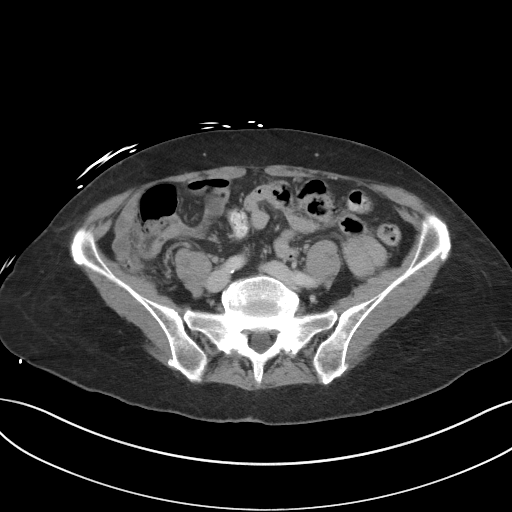
[im 52/93  soft-tissue]
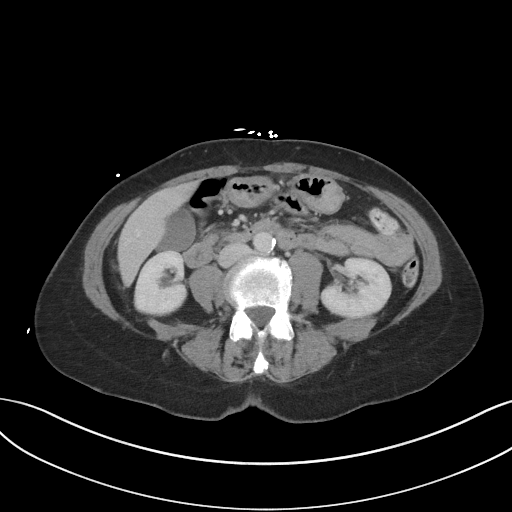
[im 57/93  soft-tissue]
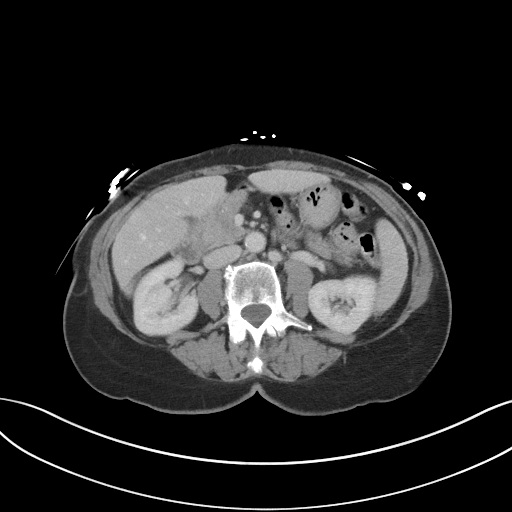
[im 67/93  soft-tissue]
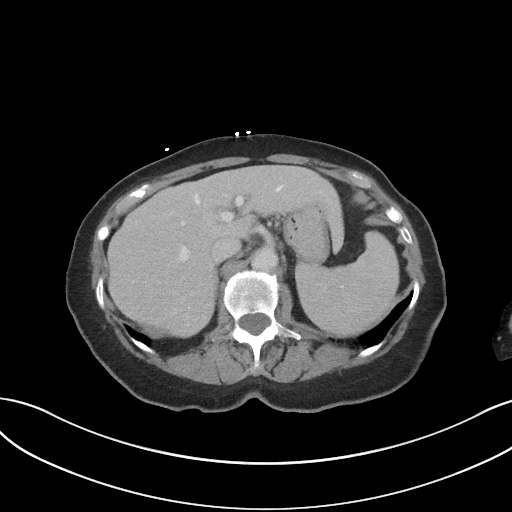
[im 67/93  bone]
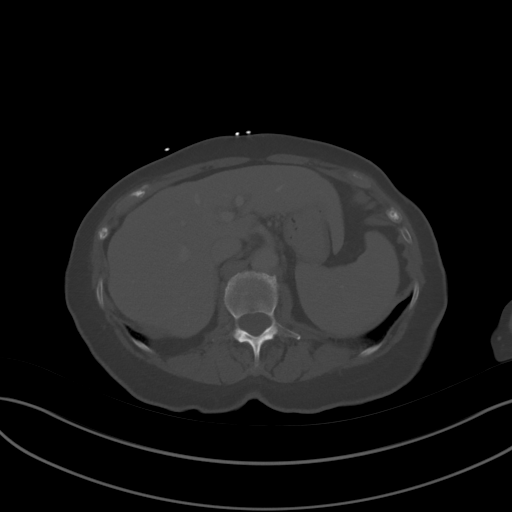
[im 72/93  soft-tissue]
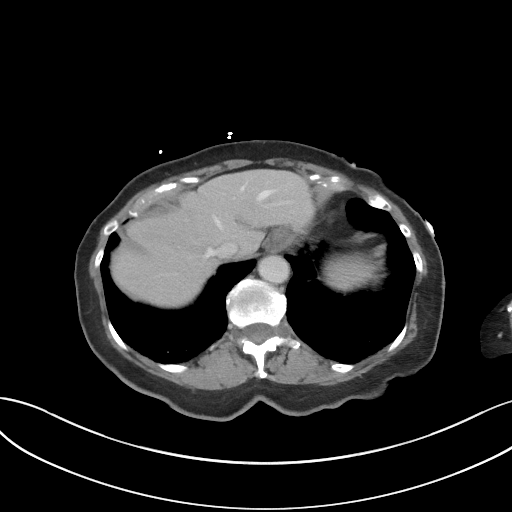
[im 77/93  soft-tissue]
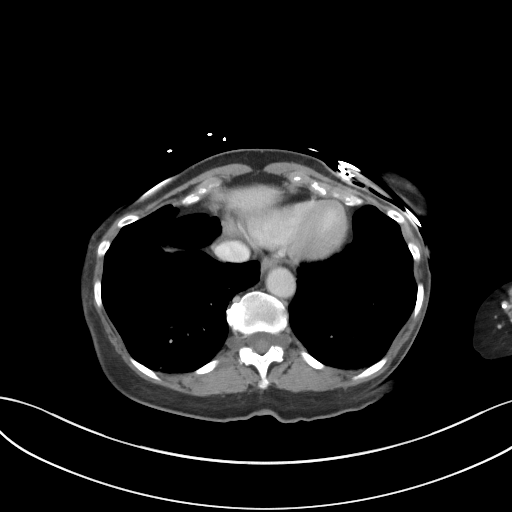
[im 87/93  soft-tissue]
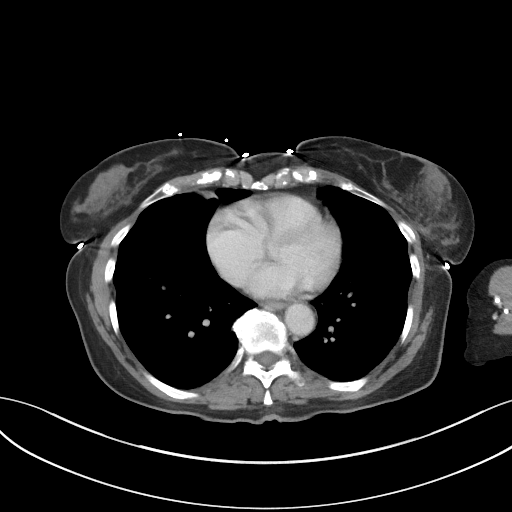

[Series 5: coronal st · coronal · 0.69mm/px · 3 of 80 slices shown]
[im 27/80  soft-tissue]
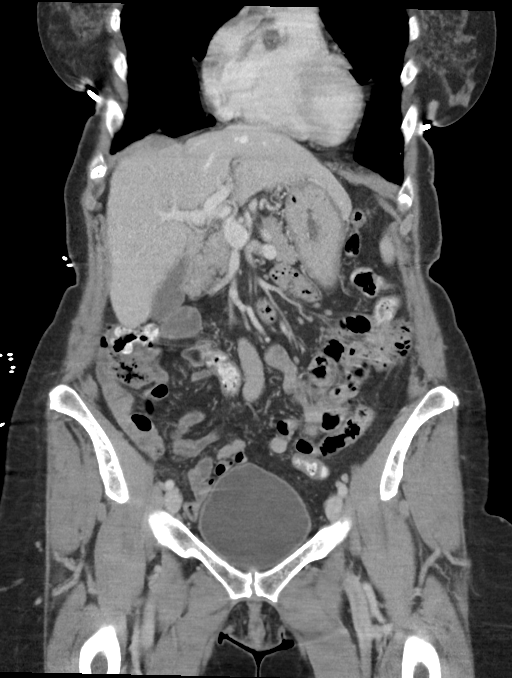
[im 36/80  soft-tissue]
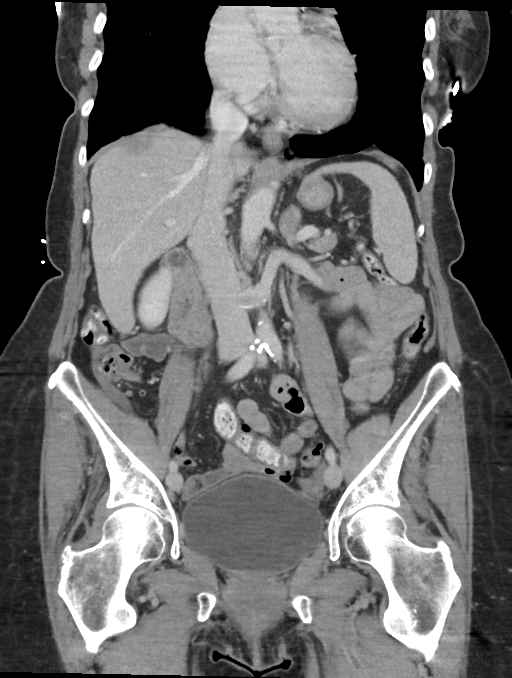
[im 44/80  soft-tissue]
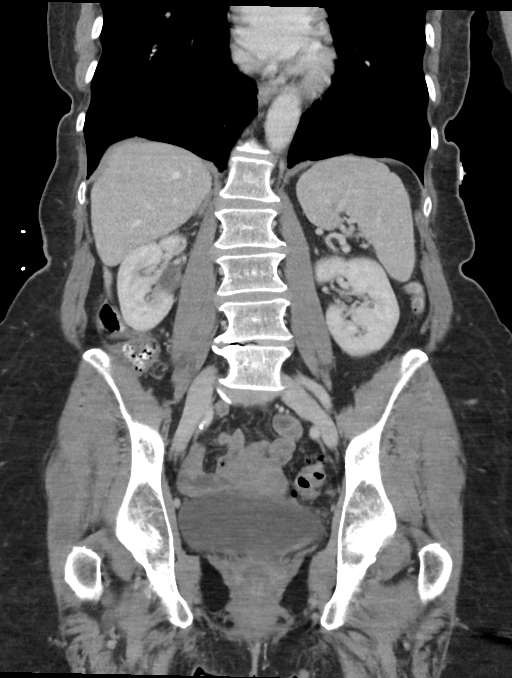

[15 of 46 positions shown; findings below may reference images not displayed]

RADIATION DOSE REDUCTION: This exam was performed according to the
departmental dose-optimization program which includes automated
exposure control, adjustment of the mA and/or kV according to
patient size and/or use of iterative reconstruction technique.

CONTRAST:  100mL OMNIPAQUE IOHEXOL 350 MG/ML SOLN
FINDINGS: CTA CHEST FINDINGS

Cardiovascular: There is no cardiomegaly or pericardial effusion.
The thoracic aorta is unremarkable. The origins of the great vessels
of the aortic arch appear patent. No pulmonary artery embolus
identified.

Mediastinum/Nodes: There is no hilar or mediastinal adenopathy. The
esophagus is grossly unremarkable. No mediastinal fluid collection.

Lungs/Pleura: The scattered bandlike and nodular densities
throughout the lungs as seen on the prior CT, chronic and likely
areas of scarring. No focal consolidation, pleural effusion,
pneumothorax. The central airways are patent.

Musculoskeletal: Degenerative changes of the spine. No acute osseous
pathology.

Review of the MIP images confirms the above findings.

CT ABDOMEN and PELVIS FINDINGS

No intra-abdominal free air or free fluid.

Hepatobiliary: No focal liver abnormality is seen. No gallstones,
gallbladder wall thickening, or biliary dilatation.

Pancreas: Unremarkable. No pancreatic ductal dilatation or
surrounding inflammatory changes.

Spleen: Normal in size without focal abnormality.

Adrenals/Urinary Tract: Adrenal glands are unremarkable. Kidneys are
normal, without renal calculi, focal lesion, or hydronephrosis.
Bladder is unremarkable.

Stomach/Bowel: Sigmoid diverticulosis without active inflammatory
changes. There is no bowel obstruction or active inflammation. The
appendix is normal.

Vascular/Lymphatic: Mild aortoiliac atherosclerotic disease. The IVC
is unremarkable. No portal venous gas. There is no adenopathy.

Reproductive: The uterus and ovaries are unremarkable. No adnexal
masses.

Other: None

Musculoskeletal: Degenerative changes of the spine. No acute osseous
pathology.

Review of the MIP images confirms the above findings.
IMPRESSION: 1. No acute intrathoracic, abdominal, or pelvic pathology. No
pulmonary artery embolus identified.
2. Sigmoid diverticulosis. No bowel obstruction. Normal appendix.
3. Aortic Atherosclerosis (UQGLB-754.4).

## 2022-10-20 IMAGING — CT CT ANGIO CHEST
2 of 7 series · 18 of 46 positions shown · IV contrast (APPLIED)
Comparison: Chest radiograph dated 09/18/2021, CT dated 05/10/2019
and CT abdomen pelvis dated 03/29/2016.

CLINICAL DATA: Shortness of breath and abdominal pain.



[Series 5: thins · axial · 0.68mm/px · z∈[-460,-210]mm · 15 of 283 slices shown]
[im 16/283  lung]
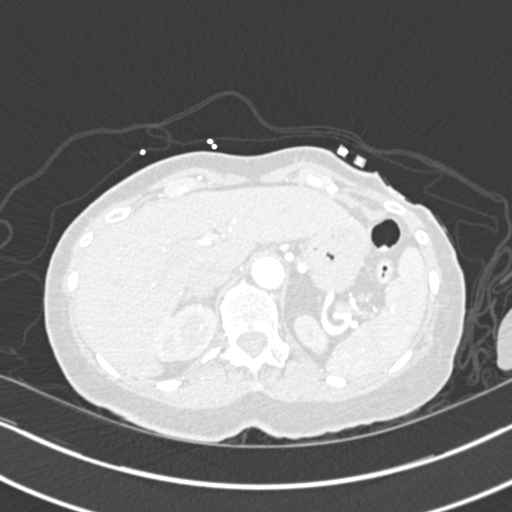
[im 32/283  soft-tissue]
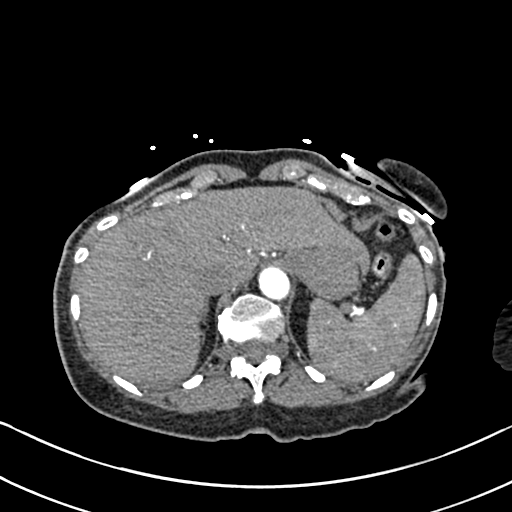
[im 48/283  lung]
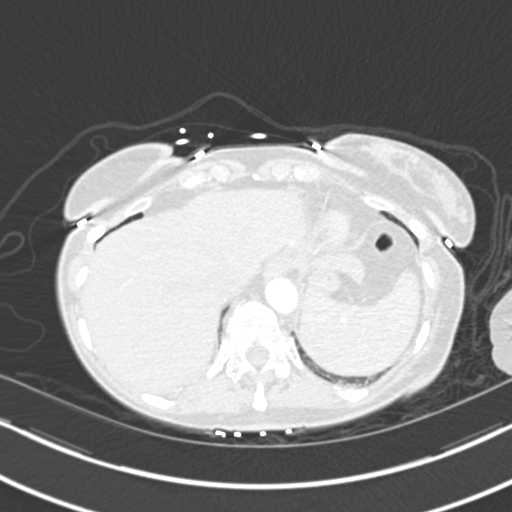
[im 63/283  soft-tissue]
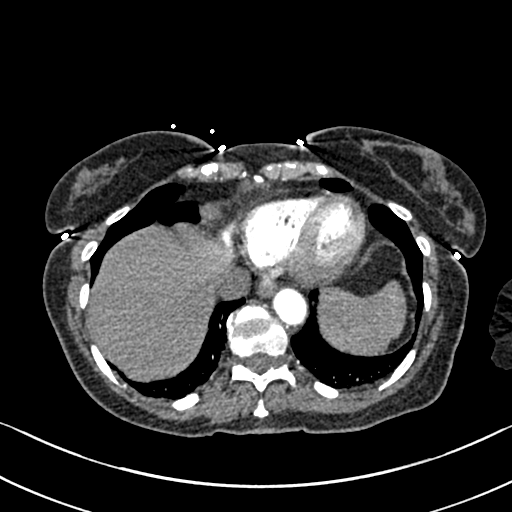
[im 95/283  lung]
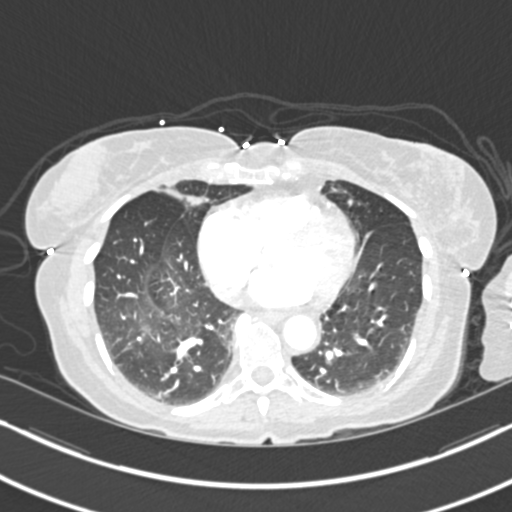
[im 110/283  soft-tissue]
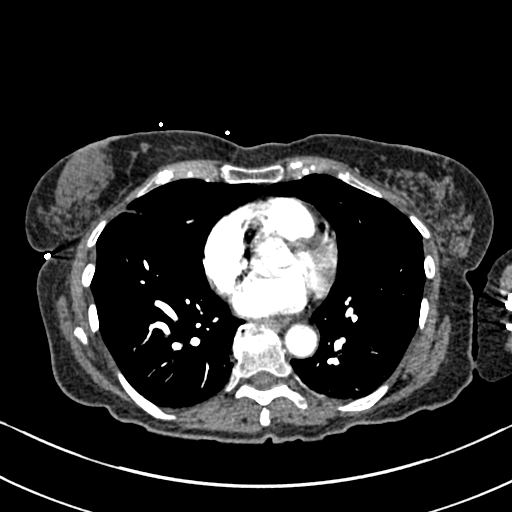
[im 126/283  lung]
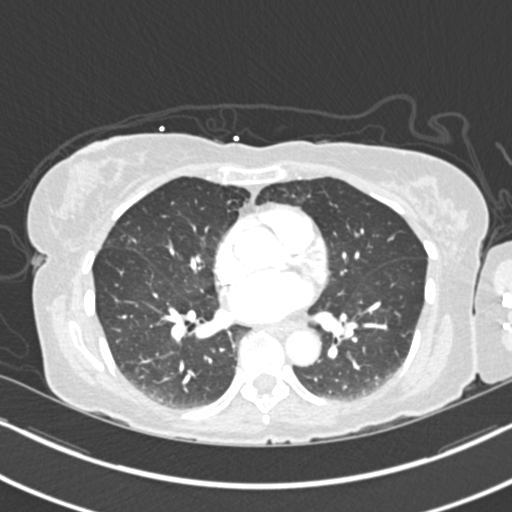
[im 142/283  soft-tissue]
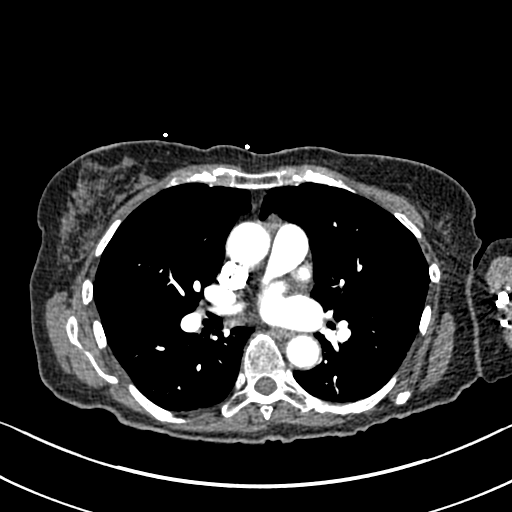
[im 157/283  lung]
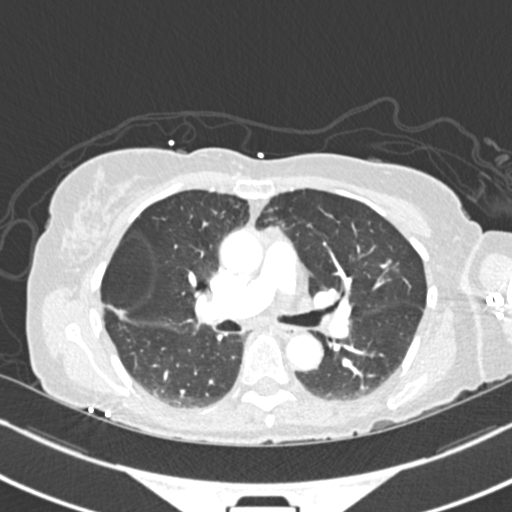
[im 173/283  soft-tissue]
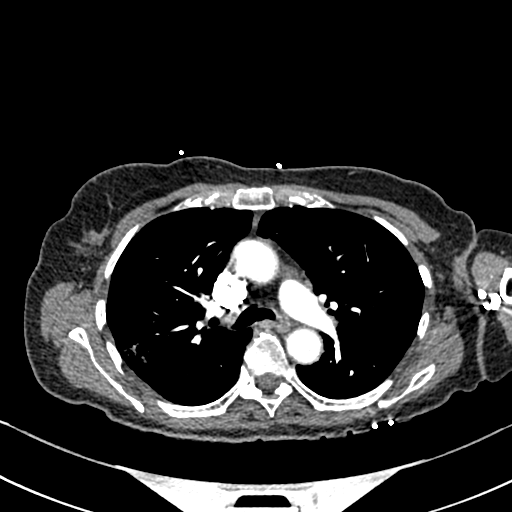
[im 189/283  lung]
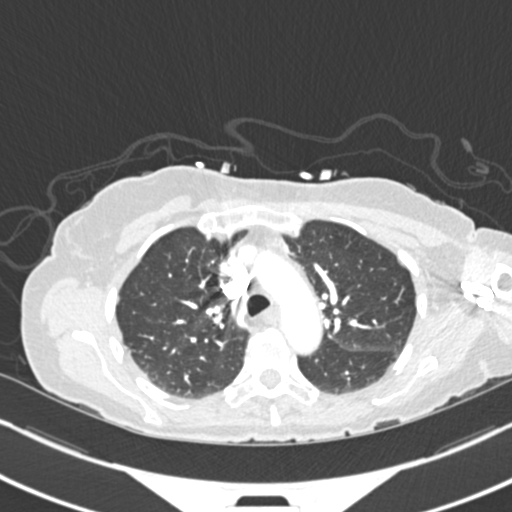
[im 220/283  soft-tissue]
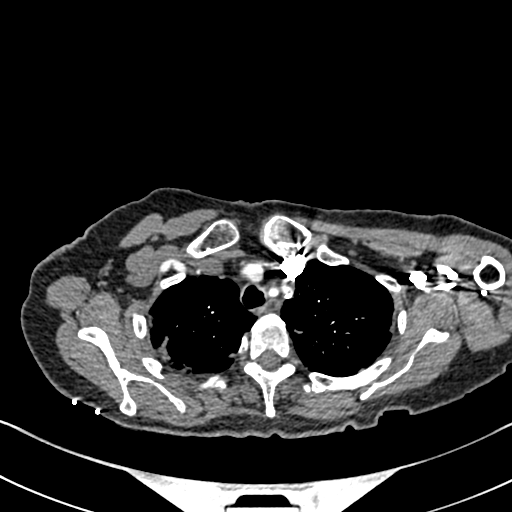
[im 236/283  lung]
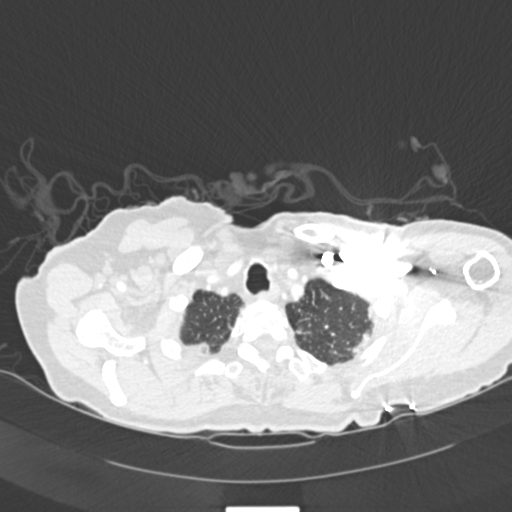
[im 251/283  soft-tissue]
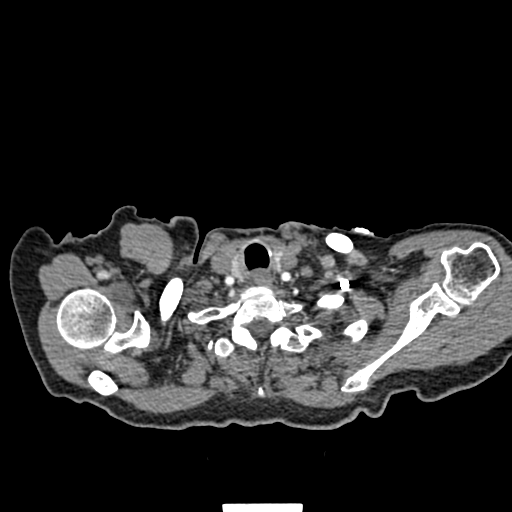
[im 267/283  lung]
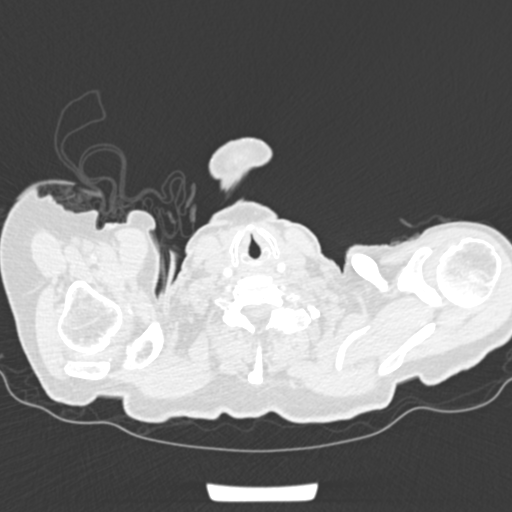

[Series 7: coronal mpr · coronal · 0.65mm/px · 3 of 114 slices shown]
[im 29/114  soft-tissue]
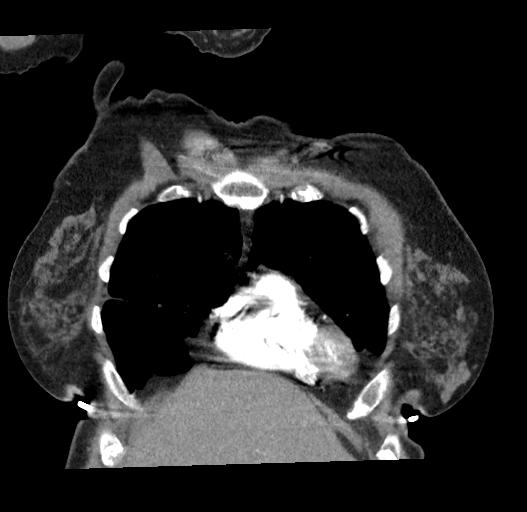
[im 57/114  soft-tissue]
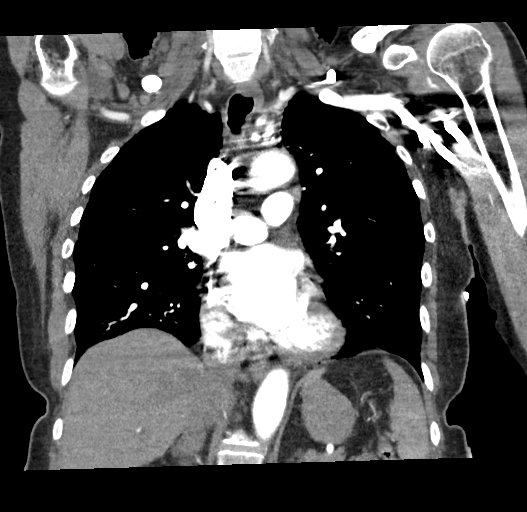
[im 85/114  soft-tissue]
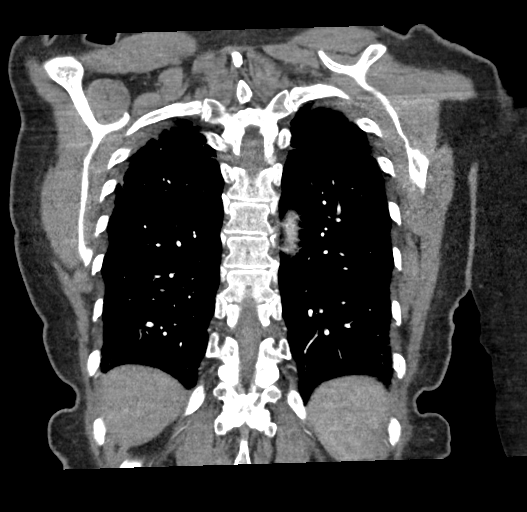

[18 of 46 positions shown; findings below may reference images not displayed]

RADIATION DOSE REDUCTION: This exam was performed according to the
departmental dose-optimization program which includes automated
exposure control, adjustment of the mA and/or kV according to
patient size and/or use of iterative reconstruction technique.

CONTRAST:  100mL OMNIPAQUE IOHEXOL 350 MG/ML SOLN
FINDINGS: CTA CHEST FINDINGS

Cardiovascular: There is no cardiomegaly or pericardial effusion.
The thoracic aorta is unremarkable. The origins of the great vessels
of the aortic arch appear patent. No pulmonary artery embolus
identified.

Mediastinum/Nodes: There is no hilar or mediastinal adenopathy. The
esophagus is grossly unremarkable. No mediastinal fluid collection.

Lungs/Pleura: The scattered bandlike and nodular densities
throughout the lungs as seen on the prior CT, chronic and likely
areas of scarring. No focal consolidation, pleural effusion,
pneumothorax. The central airways are patent.

Musculoskeletal: Degenerative changes of the spine. No acute osseous
pathology.

Review of the MIP images confirms the above findings.

CT ABDOMEN and PELVIS FINDINGS

No intra-abdominal free air or free fluid.

Hepatobiliary: No focal liver abnormality is seen. No gallstones,
gallbladder wall thickening, or biliary dilatation.

Pancreas: Unremarkable. No pancreatic ductal dilatation or
surrounding inflammatory changes.

Spleen: Normal in size without focal abnormality.

Adrenals/Urinary Tract: Adrenal glands are unremarkable. Kidneys are
normal, without renal calculi, focal lesion, or hydronephrosis.
Bladder is unremarkable.

Stomach/Bowel: Sigmoid diverticulosis without active inflammatory
changes. There is no bowel obstruction or active inflammation. The
appendix is normal.

Vascular/Lymphatic: Mild aortoiliac atherosclerotic disease. The IVC
is unremarkable. No portal venous gas. There is no adenopathy.

Reproductive: The uterus and ovaries are unremarkable. No adnexal
masses.

Other: None

Musculoskeletal: Degenerative changes of the spine. No acute osseous
pathology.

Review of the MIP images confirms the above findings.
IMPRESSION: 1. No acute intrathoracic, abdominal, or pelvic pathology. No
pulmonary artery embolus identified.
2. Sigmoid diverticulosis. No bowel obstruction. Normal appendix.
3. Aortic Atherosclerosis (UQGLB-754.4).

## 2022-11-05 ENCOUNTER — Encounter: Payer: Self-pay | Admitting: Internal Medicine

## 2022-11-07 ENCOUNTER — Encounter: Payer: Self-pay | Admitting: Internal Medicine

## 2022-11-07 ENCOUNTER — Ambulatory Visit: Payer: Medicare PPO | Admitting: Internal Medicine

## 2022-11-07 ENCOUNTER — Ambulatory Visit: Payer: Medicare PPO

## 2022-11-07 VITALS — BP 112/58 | HR 60 | Temp 97.6°F | Ht 63.5 in | Wt 136.4 lb

## 2022-11-07 DIAGNOSIS — I471 Supraventricular tachycardia, unspecified: Secondary | ICD-10-CM | POA: Insufficient documentation

## 2022-11-07 DIAGNOSIS — J4520 Mild intermittent asthma, uncomplicated: Secondary | ICD-10-CM | POA: Diagnosis not present

## 2022-11-07 DIAGNOSIS — J8410 Pulmonary fibrosis, unspecified: Secondary | ICD-10-CM

## 2022-11-07 DIAGNOSIS — R002 Palpitations: Secondary | ICD-10-CM | POA: Diagnosis not present

## 2022-11-07 DIAGNOSIS — R0789 Other chest pain: Secondary | ICD-10-CM | POA: Diagnosis not present

## 2022-11-07 DIAGNOSIS — R42 Dizziness and giddiness: Secondary | ICD-10-CM

## 2022-11-07 DIAGNOSIS — K580 Irritable bowel syndrome with diarrhea: Secondary | ICD-10-CM

## 2022-11-07 DIAGNOSIS — J449 Chronic obstructive pulmonary disease, unspecified: Secondary | ICD-10-CM | POA: Diagnosis not present

## 2022-11-07 DIAGNOSIS — R079 Chest pain, unspecified: Secondary | ICD-10-CM

## 2022-11-07 LAB — COMPREHENSIVE METABOLIC PANEL
ALT: 13 U/L (ref 0–35)
AST: 25 U/L (ref 0–37)
Albumin: 4.5 g/dL (ref 3.5–5.2)
Alkaline Phosphatase: 77 U/L (ref 39–117)
BUN: 14 mg/dL (ref 6–23)
CO2: 29 mEq/L (ref 19–32)
Calcium: 10 mg/dL (ref 8.4–10.5)
Chloride: 100 mEq/L (ref 96–112)
Creatinine, Ser: 0.87 mg/dL (ref 0.40–1.20)
GFR: 63.07 mL/min (ref >60.00)
Glucose, Bld: 89 mg/dL (ref 70–99)
Potassium: 4.9 mEq/L (ref 3.5–5.1)
Sodium: 137 mEq/L (ref 135–145)
Total Bilirubin: 0.9 mg/dL (ref 0.2–1.2)
Total Protein: 7.4 g/dL (ref 6.0–8.3)

## 2022-11-07 LAB — TSH: TSH: 1.75 u[IU]/mL (ref 0.35–5.50)

## 2022-11-07 LAB — CBC WITH DIFFERENTIAL/PLATELET
Basophils Absolute: 0.1 10*3/uL (ref 0.0–0.1)
Basophils Relative: 1.3 % (ref 0.0–3.0)
Eosinophils Absolute: 0.1 10*3/uL (ref 0.0–0.7)
Eosinophils Relative: 2.5 % (ref 0.0–5.0)
HCT: 42.8 % (ref 36.0–46.0)
Hemoglobin: 14.1 g/dL (ref 12.0–15.0)
Lymphocytes Relative: 23.8 % (ref 12.0–46.0)
Lymphs Abs: 1.3 10*3/uL (ref 0.7–4.0)
MCHC: 32.8 g/dL (ref 30.0–36.0)
MCV: 89.3 fl (ref 78.0–100.0)
Monocytes Absolute: 0.8 10*3/uL (ref 0.1–1.0)
Monocytes Relative: 15.2 % — ABNORMAL HIGH (ref 3.0–12.0)
Neutro Abs: 3.1 10*3/uL (ref 1.4–7.7)
Neutrophils Relative %: 57.2 % (ref 43.0–77.0)
Platelets: 207 10*3/uL (ref 150.0–400.0)
RBC: 4.8 Mil/uL (ref 3.87–5.11)
RDW: 14.1 % (ref 11.5–15.5)
WBC: 5.3 10*3/uL (ref 4.0–10.5)

## 2022-11-07 NOTE — Assessment & Plan Note (Signed)
Referring to Pulmnology for follow up,  last visit with Sung Amabile was in 2020

## 2022-11-07 NOTE — Patient Instructions (Addendum)
1) Resume zyrtec DAILY .  All patients with asthma,  even mild intermittent asthma, take something for allergies  and since Zyrtec works,  stay on it  2) Repeat pulmonary function tests need to be done to evaluate you asthma  3) ZIO monitor has been  ordered to evaluate how often you are having runs of SVT   4) You have NO EVIDENCE of an AORTIC ANEURYSM.  (You had multiple imaging studies done last Winter that showed a normal aorta.    5) Try using Mylanta Gas,   Gas X , or Phasyme the next time you feel gassy or bloated

## 2022-11-07 NOTE — Assessment & Plan Note (Signed)
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  She has chronic LLQ tenderness without guarding to deep palpation but no recent change in stool habits to suggest diverticulitis

## 2022-11-07 NOTE — Assessment & Plan Note (Signed)
I have ordered and reviewed a 12 lead EKG and find that there are no acute changes and patient is in sinus rhythm.   Given her increased episodes of chest tightness and light headedness,  ZIO monitor ordered to assess burden of SVT and rule out PAF.

## 2022-11-07 NOTE — Assessment & Plan Note (Signed)
She has been lost to follow up with Pulmonolgy since Dr Sung Amabile left .  Last visit June 2020 at which time she was asymptomatic without daily use of any bronchodilator or steroids.  No wheezing on exam.  Chest x ray ordered.  PFTs ordered.  Pulmonary referral ordered.  Resume zyrtec and follow up one month

## 2022-11-07 NOTE — Assessment & Plan Note (Signed)
Has not seen a pulmonologist since Dr Sung Amabile left 5 years ago. Has been using an  albuterol inhaler intermittently for the current episodes of chest fullness which helps if she rests.

## 2022-11-07 NOTE — Progress Notes (Signed)
Subjective:  Patient ID: Alison Bradshaw, female    DOB: 1941-03-06  Age: 82 y.o. MRN: [id number]  CC: The primary encounter diagnosis was Chest pain, unspecified type. Diagnoses of Chronic obstructive pulmonary disease, unspecified COPD type (HCC), Palpitations, Light-headed feeling, Feeling of chest tightness, Mild intermittent asthma, unspecified whether complicated, Granulomatous lung disease (HCC), Irritable bowel syndrome with diarrhea, and SVT (supraventricular tachycardia) were also pertinent to this visit.   HPI Alison Bradshaw presents for evaluation of multiple subacute  symptoms  Chief Complaint  Patient presents with   Dizziness    Pt c/o having "episodes" where she feels lightheaded when bending over, pt also c/o having a chest fullness type of feeling occasionally notices its happening when its extremely hot. Denies any chest pain or discomfort   82 YR OLD female with IBS/ chronic constipation,   no history of CAD presents with episodes of  1)  positional light headedness (when bending over),  2)   chest fullness (described as tightness but not pain ) and shortness of breath brought on with activity and exposure to intense heat accompanied by light headedness without vertigo or nausea. She is aware of her heart beat when these episodes occur,  but does not notice ann irregularity in the rhythm,  but is concerned that she can palpate her HB in her  xiphoid area  ("my father had an aneurysm.").  She has had increased bloating and gas, but asserts that her  bowels are moving normally with daily use of  citrucel and prn use bentyl . She notes some LLQ pain , cramping in nature,  that is brought on by the need to defecate, and resolves afterward.   Has used mylanta prn but has not tried Geophysicist/field seismologist.    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   History of mild intermittent asthma last seen by  [facility] in 2020 PFT done in [id number] not in chart.   She does note the presence of Increased stressors but denies feeling anxious:  her 31 yr old grandson is staying with her currently after having had a  conflict with parents over getting a job.    Also notes a 6 to 8 week history of intermittent rhinitis,  which is transiently resolved with prn zyrtec     Outpatient Medications Prior to Visit  Medication Sig Dispense Refill   acetaminophen (TYLENOL) 500 MG tablet Take 1,000 mg by mouth every 6 (six) hours as needed.     albuterol (VENTOLIN HFA) 108 (90 Base) MCG/ACT inhaler Inhale 2 puffs into the lungs every 6 (six) hours as needed for shortness of breath. 3 each 0   ALPRAZolam (XANAX) 0.25 MG tablet Take 1 tablet (0.25 mg total) by mouth at bedtime as needed for anxiety. 30 tablet 1   fluticasone (FLONASE) 50 MCG/ACT nasal spray Place 2 sprays into both nostrils daily. 1 g 4   pantoprazole (PROTONIX) 40 MG tablet Take 1 tablet (40 mg total) by mouth daily. 90 tablet 3   rosuvastatin (CRESTOR) 10 MG tablet Take 1 tablet (10 mg total) by mouth daily. 90 tablet 1   sucralfate (CARAFATE) 1 G tablet Take 1 g by mouth in the morning, at noon, and at bedtime. Pt only takes when she's really needs it which isn't very often     No facility-administered medications prior to visit.  Review of Systems;  Patient denies headache, fevers, malaise, unintentional weight loss, skin rash, eye pain, sinus congestion and sinus pain, sore throat, dysphagia,  hemoptysis , , wheezing, chest pain, palpitations, orthopnea, edema, abdominal pain, nausea, melena, diarrhea, constipation, flank pain, dysuria, hematuria, urinary  Frequency, nocturia, numbness, tingling, seizures,  Focal weakness, Loss of consciousness,  Tremor, insomnia, depression, anxiety, and suicidal ideation.      Objective:  BP (!) 112/58   Pulse 60   Temp 97.6 F (36.4 C) (Oral)   Ht 5' 3.5" (1.613 m)   Wt 136 lb 6.4 oz (61.9 kg)   SpO2  100%   BMI 23.78 kg/m   BP Readings from Last 3 Encounters:  11/07/22 (!) 112/58  09/06/22 110/64  08/29/22 122/72    Wt Readings from Last 3 Encounters:  11/07/22 136 lb 6.4 oz (61.9 kg)  10/07/22 138 lb (62.6 kg)  09/06/22 137 lb 12.8 oz (62.5 kg)    Physical Exam Vitals reviewed.  Constitutional:      General: She is not in acute distress.    Appearance: Normal appearance. She is normal weight. She is not ill-appearing, toxic-appearing or diaphoretic.  HENT:     Head: Normocephalic.  Eyes:     General: No scleral icterus.       Right eye: No discharge.        Left eye: No discharge.     Conjunctiva/sclera: Conjunctivae normal.  Cardiovascular:     Rate and Rhythm: Normal rate and regular rhythm.     Heart sounds: Normal heart sounds.  Pulmonary:     Effort: Pulmonary effort is normal. No respiratory distress.     Breath sounds: Normal breath sounds.  Musculoskeletal:        General: Normal range of motion.  Skin:    General: Skin is warm and dry.  Neurological:     General: No focal deficit present.     Mental Status: She is alert and oriented to person, place, and time. Mental status is at baseline.  Psychiatric:        Mood and Affect: Mood normal.        Behavior: Behavior normal.        Thought Content: Thought content normal.        Judgment: Judgment normal.     No results found for: "HGBA1C"  Lab Results  Component Value Date   CREATININE 0.87 11/07/2022   CREATININE 0.81 08/27/2022   CREATININE 0.77 04/07/2022    Lab Results  Component Value Date   WBC 5.3 11/07/2022   HGB 14.1 11/07/2022   HCT 42.8 11/07/2022   PLT 207.0 11/07/2022   GLUCOSE 89 11/07/2022   CHOL 164 08/27/2022   TRIG 56.0 08/27/2022   HDL 68.00 08/27/2022   LDLDIRECT 85.0 08/27/2022   LDLCALC 85 08/27/2022   ALT 13 11/07/2022   AST 25 11/07/2022   NA 137 11/07/2022   K 4.9 11/07/2022   CL 100 11/07/2022   CREATININE 0.87 11/07/2022   BUN 14 11/07/2022   CO2 29  11/07/2022   TSH 1.75 11/07/2022    MM 3D SCREEN BREAST BILATERAL  Result Date: 05/23/2022 CLINICAL DATA:  Screening. EXAM: DIGITAL SCREENING BILATERAL MAMMOGRAM WITH TOMOSYNTHESIS AND CAD TECHNIQUE: Bilateral screening digital craniocaudal and mediolateral oblique mammograms were obtained. Bilateral screening digital breast tomosynthesis was performed. The images were evaluated with computer-aided detection. COMPARISON:  Previous exam(s). ACR Breast Density Category c: The breasts are heterogeneously dense, which may obscure small masses.  FINDINGS: There are no findings suspicious for malignancy. IMPRESSION: No mammographic evidence of malignancy. A result letter of this screening mammogram will be mailed directly to the patient. RECOMMENDATION: Screening mammogram in one year. (Code:SM-B-01Y) BI-RADS CATEGORY  1: Negative. Electronically Signed   By: Sande Brothers M.D.   On: 05/23/2022 14:38    Assessment & Plan:  .Chest pain, unspecified type -     EKG 12-Lead  Chronic obstructive pulmonary disease, unspecified COPD type [facility]) Assessment & Plan: Has not seen a pulmonologist since Dr Sung Amabile left 5 years ago. Has been using an  albuterol inhaler intermittently for the current episodes of chest fullness which helps if she rests.     Palpitations Assessment & Plan: I have ordered and reviewed a 12 lead EKG and find that there are no acute changes and patient is in sinus rhythm.   Given her increased episodes of chest tightness and light headedness,  ZIO monitor ordered to assess burden of SVT and rule out PAF.    Orders: -     CBC with Differential/Platelet -     Comprehensive metabolic panel -     TSH  Light-headed feeling Assessment & Plan: She has no history of hypertension and is not hypotensive today.  She has a history of SVT by prior Holter monitor done 2 years ago.  Repeat evaluation with ZIO advised to correlate symptoms with runs   Orders: -     LONG TERM MONITOR (3-14  DAYS); Future  Feeling of chest tightness Assessment & Plan: She has a history of mild intermittent asthma,  and has used her albuterol MDI on several occasions with good relief. Chest x ray ,  PFTs and pulmonology follow up advised.  Orders: -     DG Chest 2 View; Future  Mild intermittent asthma, unspecified whether complicated Assessment & Plan: She has been lost to follow up with Pulmonolgy since Dr Sung Amabile left .  Last visit June 2020 at which time she was asymptomatic without daily use of any bronchodilator or steroids.  No wheezing on exam.  Chest x ray ordered.  PFTs ordered.  Pulmonary referral ordered.  Resume zyrtec and follow up one month   Orders: -     Ambulatory referral to Pulmonology -     DG Chest 2 View; Future  Granulomatous lung disease (HCC) Assessment & Plan: Referring to Pulmnology for follow up,  last visit with Sung Amabile was in 2020   Orders: -     Ambulatory referral to Pulmonology -     DG Chest 2 View; Future  Irritable bowel syndrome with diarrhea Assessment & Plan: Currently her  IBS symptoms are managed with daily  use of .citrucel.  She has chronic LLQ tenderness without guarding to deep palpation but no recent change in stool habits to suggest diverticulitis     SVT (supraventricular tachycardia)     I provided  59 minutes of face-to-face time during this encounter reviewing patient's last visit with me, patient's  most recent visit with cardiology,  and pulmonology (in 2020 with Dr Sung Amabile) ,  previous  surgical and non surgical procedures, previous  labs and imaging studies, counseling on currently addressed issues,  and post visit ordering to diagnostics and therapeutics .   Follow-up: Return in about 4 weeks (around 12/05/2022).   Sherlene Shams, MD

## 2022-11-08 ENCOUNTER — Ambulatory Visit
Admission: RE | Admit: 2022-11-08 | Discharge: 2022-11-08 | Disposition: A | Discharge status: Home / Self Care | Payer: Medicare PPO | Attending: Internal Medicine | Admitting: Internal Medicine

## 2022-11-08 ENCOUNTER — Ambulatory Visit
Admission: RE | Admit: 2022-11-08 | Discharge: 2022-11-08 | Disposition: A | Discharge status: Home / Self Care | Payer: Medicare PPO | Source: Ambulatory Visit | Attending: Internal Medicine | Admitting: Internal Medicine

## 2022-11-08 DIAGNOSIS — J4520 Mild intermittent asthma, uncomplicated: Secondary | ICD-10-CM | POA: Insufficient documentation

## 2022-11-08 DIAGNOSIS — R0789 Other chest pain: Secondary | ICD-10-CM | POA: Insufficient documentation

## 2022-11-08 DIAGNOSIS — J8410 Pulmonary fibrosis, unspecified: Secondary | ICD-10-CM

## 2022-11-08 NOTE — Assessment & Plan Note (Signed)
She has no history of hypertension and is not hypotensive today.  She has a history of SVT by prior Holter monitor done 2 years ago.  Repeat evaluation with ZIO advised to correlate symptoms with runs

## 2022-11-08 NOTE — Assessment & Plan Note (Signed)
She has a history of mild intermittent asthma,  and has used her albuterol MDI on several occasions with good relief. Chest x ray ,  PFTs and pulmonology follow up advised.

## 2022-11-14 ENCOUNTER — Other Ambulatory Visit: Payer: Self-pay | Admitting: Internal Medicine

## 2022-11-14 NOTE — Telephone Encounter (Signed)
LOV 12/20/2022 NOV 11/11/2022

## 2022-11-21 ENCOUNTER — Encounter (INDEPENDENT_AMBULATORY_CARE_PROVIDER_SITE_OTHER): Payer: Self-pay

## 2022-11-22 ENCOUNTER — Telehealth: Payer: Self-pay | Admitting: Internal Medicine

## 2022-11-22 NOTE — Telephone Encounter (Signed)
Patient just called saying She needs to get a Pulmonary Function test scheduled. Could you call her back at [phone].

## 2022-11-27 ENCOUNTER — Ambulatory Visit: Payer: Medicare PPO | Admitting: Pulmonary Disease

## 2022-11-27 ENCOUNTER — Encounter: Payer: Self-pay | Admitting: Pulmonary Disease

## 2022-11-27 VITALS — BP 112/62 | HR 75 | Temp 97.8°F | Ht 63.0 in | Wt 139.4 lb

## 2022-11-27 DIAGNOSIS — R0602 Shortness of breath: Secondary | ICD-10-CM | POA: Diagnosis not present

## 2022-11-27 MED ORDER — FLUTICASONE PROPIONATE 50 MCG/ACT NA SUSP: 1.0000 | Freq: Every day | NASAL | 2 refills | Status: DC

## 2022-11-27 MED ORDER — BUDESONIDE-FORMOTEROL FUMARATE 80-4.5 MCG/ACT IN AERO: 2.0000 | INHALATION_SPRAY | Freq: Four times a day (QID) | RESPIRATORY_TRACT | 12 refills | Status: DC

## 2022-11-27 NOTE — Progress Notes (Signed)
Synopsis: Referred in by Sherlene Shams, MD   Subjective:   PATIENT ID: Alison Bradshaw GENDER: female DOB: 12/28/1940, MRN: [id number]  Chief Complaint  Patient presents with   pulmonary consult    Sneezing, chest/head congestion and SOB with exertion and occ at rest.     HPI Alison Bradshaw is a 82 year old female patient with a past medical history of of mild persistent asthma, presumed granulomatous lung disease question NTM presenting to the pulmonary clinic as a referral from Dr. Duncan Dull for further evaluation of shortness of breath.  Patient reports that about 3 months ago she started having labored breathing.  Describes it as feeling of working hard to take a deep breath in.  She notices that usually during hot humid weather.  She denies any wheezing.  She denies any chest pain. She does report seasonal nasal congestion and possible post nasal drip.   She last saw Dr. Sung Amabile 5 years ago for Asthma and Granulomatous disease. 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   Family history - Emphysema in the family   Social History - Never smoker, denies alcohol use, denies any illict drug use. Has a dog at home and worked as a Warden/ranger.    ROS All systems were reviewed and are negative except for the above.  Objective:   Vitals:   11/27/22 1128  BP: 112/62  Pulse: 75  Temp: 97.8 F (36.6 C)  TempSrc: Temporal  SpO2: 100%  Weight: 139 lb 6.4 oz (63.2 kg)  Height: 5\' 3"  (1.6 m)   100% on RA BMI Readings from Last 3 Encounters:  11/27/22 24.69 kg/m  11/07/22 23.78 kg/m  10/07/22 24.06 kg/m   Wt Readings from Last 3 Encounters:  11/27/22 139 lb 6.4 oz (63.2 kg)  11/07/22 136 lb 6.4 oz (61.9 kg)  10/07/22 138 lb (62.6 kg)    Physical Exam GEN: NAD, Healthy Appearing HEENT: Supple Neck, Reactive Pupils, EOMI  CVS: Normal S1, Normal S2, RRR, No murmurs or ES appreciated  Lungs: Clear  bilateral air entry.  Abdomen: Soft, non tender, non distended, + BS  Extremities: Warm and well perfused, No edema  Skin: No suspicious lesions appreciated  Psych: Normal Affect  Ancillary Information   CBC    Component Value Date/Time   WBC 5.3 11/07/2022 1319   RBC 4.80 11/07/2022 1319   HGB 14.1 11/07/2022 1319   HCT 42.8 11/07/2022 1319   PLT 207.0 11/07/2022 1319   MCV 89.3 11/07/2022 1319   MCH 29.3 04/07/2022 1454   MCHC 32.8 11/07/2022 1319   RDW 14.1 11/07/2022 1319   LYMPHSABS 1.3 11/07/2022 1319   MONOABS 0.8 11/07/2022 1319   EOSABS 0.1 11/07/2022 1319   BASOSABS 0.1 11/07/2022 1319    PFT 2019 -  FVC: 3.05 L (115 %pred), FEV1: 1.81 L (89 %pred), FEV1/FVC: 60%, TLC: 4.64 L (93 %pred), DLCO 82 %pred  CT chest 09/07/2018  There is bandlike scarring of the right middle lobe and lingula with clustered nodularity of the right upper lobe. Previously noted clustered nodules of the posterior right upper lobe in this vicinity are almost completely resolved. 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 No new nodules are noted.     No data to display           Assessment & Plan:  Alison Bradshaw is a 82 year old female patient with a past  medical history of of mild persistent asthma, presumed granulomatous lung disease question NTM presenting to the pulmonary clinic as a referral from Dr. Duncan Dull for further evaluation of shortness of breath.  #Mild persistent asthma without exacerbation  #Granulomatous lung disease? Tree in bud in the RUL with some scarring ( could be NTM could also be lung scarring from prior pneumonia sequelae)  #Allergic rhinitis   []  Agree with PFTs  []  CT chest wo Contrast  []  Start Budesonide-Formoterol [Symbicort] 80-4.57mcg 2 puffs twice a day.  []  c/w Albuterol Inh PRN 2 puffs Q6H  []  Start Flonase 1 puff each nares daily for 30 days.   Return in about 3 months (around 02/27/2023).  I  spent 60 minutes caring for this patient today, including preparing to see the patient, obtaining a medical history , reviewing a separately obtained history, performing a medically appropriate examination and/or evaluation, counseling and educating the patient/family/caregiver, ordering medications, tests, or procedures, documenting clinical information in the electronic health record, and independently interpreting results (not separately reported/billed) and communicating results to the patient/family/caregiver  Janann Colonel, MD [facility] 11/27/2022 1:25 PM

## 2022-11-27 NOTE — Addendum Note (Signed)
Addended by: Janann Colonel on: 11/27/2022 09:35 PM   Modules accepted: Orders

## 2022-11-29 DIAGNOSIS — R42 Dizziness and giddiness: Secondary | ICD-10-CM | POA: Diagnosis not present

## 2022-12-02 ENCOUNTER — Encounter: Payer: Self-pay | Admitting: Pulmonary Disease

## 2022-12-03 DIAGNOSIS — H6123 Impacted cerumen, bilateral: Secondary | ICD-10-CM | POA: Diagnosis not present

## 2022-12-03 DIAGNOSIS — H6061 Unspecified chronic otitis externa, right ear: Secondary | ICD-10-CM | POA: Diagnosis not present

## 2022-12-03 DIAGNOSIS — J301 Allergic rhinitis due to pollen: Secondary | ICD-10-CM | POA: Diagnosis not present

## 2022-12-03 DIAGNOSIS — H903 Sensorineural hearing loss, bilateral: Secondary | ICD-10-CM | POA: Diagnosis not present

## 2022-12-03 NOTE — Telephone Encounter (Signed)
Noted.  Will close encounter.  

## 2022-12-05 DIAGNOSIS — L298 Other pruritus: Secondary | ICD-10-CM | POA: Diagnosis not present

## 2022-12-05 DIAGNOSIS — R208 Other disturbances of skin sensation: Secondary | ICD-10-CM | POA: Diagnosis not present

## 2022-12-05 DIAGNOSIS — L538 Other specified erythematous conditions: Secondary | ICD-10-CM | POA: Diagnosis not present

## 2022-12-05 DIAGNOSIS — L820 Inflamed seborrheic keratosis: Secondary | ICD-10-CM | POA: Diagnosis not present

## 2022-12-05 DIAGNOSIS — L570 Actinic keratosis: Secondary | ICD-10-CM | POA: Diagnosis not present

## 2022-12-05 DIAGNOSIS — I8393 Asymptomatic varicose veins of bilateral lower extremities: Secondary | ICD-10-CM | POA: Diagnosis not present

## 2022-12-06 ENCOUNTER — Ambulatory Visit
Admission: RE | Admit: 2022-12-06 | Discharge: 2022-12-06 | Disposition: A | Discharge status: Home / Self Care | Payer: Medicare PPO | Source: Ambulatory Visit | Attending: Pulmonary Disease | Admitting: Pulmonary Disease

## 2022-12-06 DIAGNOSIS — R918 Other nonspecific abnormal finding of lung field: Secondary | ICD-10-CM | POA: Diagnosis not present

## 2022-12-06 DIAGNOSIS — I700 Atherosclerosis of aorta: Secondary | ICD-10-CM | POA: Diagnosis not present

## 2022-12-06 DIAGNOSIS — R0602 Shortness of breath: Secondary | ICD-10-CM | POA: Diagnosis not present

## 2022-12-16 ENCOUNTER — Other Ambulatory Visit: Payer: Self-pay | Admitting: Internal Medicine

## 2022-12-16 ENCOUNTER — Encounter: Payer: Self-pay | Admitting: Internal Medicine

## 2022-12-16 ENCOUNTER — Ambulatory Visit: Payer: Medicare PPO | Admitting: Internal Medicine

## 2022-12-16 VITALS — BP 116/62 | HR 63 | Ht 63.0 in | Wt 141.4 lb

## 2022-12-16 DIAGNOSIS — R42 Dizziness and giddiness: Secondary | ICD-10-CM

## 2022-12-16 DIAGNOSIS — R002 Palpitations: Secondary | ICD-10-CM

## 2022-12-16 DIAGNOSIS — Z7189 Other specified counseling: Secondary | ICD-10-CM | POA: Diagnosis not present

## 2022-12-16 DIAGNOSIS — I471 Supraventricular tachycardia, unspecified: Secondary | ICD-10-CM

## 2022-12-16 DIAGNOSIS — R0789 Other chest pain: Secondary | ICD-10-CM

## 2022-12-16 DIAGNOSIS — J4520 Mild intermittent asthma, uncomplicated: Secondary | ICD-10-CM

## 2022-12-16 NOTE — Assessment & Plan Note (Signed)
Reviewed results of ZIO monitor:  normal

## 2022-12-16 NOTE — Assessment & Plan Note (Signed)
DNR status has been requested by patient after risks and benefits of the order were discussed today .  Order given to patient and placed in chart. ?

## 2022-12-16 NOTE — Assessment & Plan Note (Signed)
DNR status has been requested by patient after risks and benefits of the order were discussed today .  Order given to patient and placed in chart. , as well as completion of MOST form.  Son was present as well

## 2022-12-16 NOTE — Assessment & Plan Note (Signed)
ZIO monitor reviewed:  no atrial fibrillation or runs of SVT

## 2022-12-16 NOTE — Assessment & Plan Note (Signed)
REFERRED to PULMONARY AFTER LAST VISIT:  Recommendations from eval are summarized below, (CT done August 30, no reading yet)   Agree with PFTs  []  CT chest wo Contrast  []  Start Budesonide-Formoterol [Symbicort] 80-4.14mcg 2 puffs twice a day.  []  c/w Albuterol Inh PRN 2 puffs Q6H  []  Start Flonase 1 puff each nares daily for 30 days.

## 2022-12-16 NOTE — Assessment & Plan Note (Signed)
Suggested by symptoms which have improved with Symbicort MDI .  Repeat PFts are scheduled Sept 24

## 2022-12-16 NOTE — Progress Notes (Signed)
Subjective:  Patient ID: Alison Bradshaw, female    DOB: May 27, 1940  Age: 82 y.o. MRN: [id number]  CC: The primary encounter diagnosis was Light-headed feeling. Diagnoses of Feeling of chest tightness, SVT (supraventricular tachycardia), Mild intermittent asthma, unspecified whether complicated, and DNR (do not resuscitate) discussion were also pertinent to this visit.   HPI Alison Bradshaw presents for discussion of end of life objectives.  She is accompanied by her son, Alison Bradshaw who has POA  Chief Complaint  Patient presents with   discuss DNR and MOST forms   1) Follow up on Dyspnea:  has seen pulmonary .  CT chest was done August 30  (results still pending) . PFTs  ordered Sept 24.  Taking symbicort and flonase.  Some tremulousness of hands noted after yse discussed with pulmonary but improving.   Dyspnea has resolved.   2) EOL objectives.  Patient has requested DNR status .  MOST form discussed in detail . Her choices were discussed ":  she is concerned about being "kept alive" when there is no hope of restoration of quality of life. She has no current signs of dementia or depression and is quite active and intact  intellectually and physically.     Outpatient Medications Prior to Visit  Medication Sig Dispense Refill   acetaminophen (TYLENOL) 500 MG tablet Take 1,000 mg by mouth every 6 (six) hours as needed.     albuterol (VENTOLIN HFA) 108 (90 Base) MCG/ACT inhaler Inhale 2 puffs into the lungs every 6 (six) hours as needed for shortness of breath. 3 each 0   ALPRAZolam (XANAX) 0.25 MG tablet TAKE 1 TABLET(0.25 MG) BY MOUTH AT BEDTIME AS NEEDED FOR ANXIETY 30 tablet 5   azelastine (ASTELIN) 0.1 % nasal spray Place 1-2 sprays into both nostrils 2 (two) times daily.     budesonide-formoterol (SYMBICORT) 80-4.5 MCG/ACT inhaler Inhale 2 puffs into the lungs every 6 (six) hours. 1 each 12   pantoprazole (PROTONIX) 40 MG tablet Take 1 tablet (40 mg total) by mouth daily. 90 tablet 3    rosuvastatin (CRESTOR) 10 MG tablet Take 1 tablet (10 mg total) by mouth daily. 90 tablet 1   sucralfate (CARAFATE) 1 G tablet Take 1 g by mouth in the morning, at noon, and at bedtime. Pt only takes when she's really needs it which isn't very often     fluticasone (FLONASE) 50 MCG/ACT nasal spray Place 2 sprays into both nostrils daily. (Patient not taking: Reported on 12/16/2022) 1 g 4   fluticasone (FLONASE) 50 MCG/ACT nasal spray Place 1 spray into both nostrils daily. (Patient not taking: Reported on 12/16/2022) 15.8 mL 2   No facility-administered medications prior to visit.    Review of Systems;  Patient denies headache, fevers, malaise, unintentional weight loss, skin rash, eye pain, sinus congestion and sinus pain, sore throat, dysphagia,  hemoptysis , cough, dyspnea, wheezing, chest pain, palpitations, orthopnea, edema, abdominal pain, nausea, melena, diarrhea, constipation, flank pain, dysuria, hematuria, urinary  Frequency, nocturia, numbness, tingling, seizures,  Focal weakness, Loss of consciousness,  Tremor, insomnia, depression, anxiety, and suicidal ideation.      Objective:  BP 116/62   Pulse 63   Ht 5\' 3"  (1.6 m)   Wt 141 lb 6.4 oz (64.1 kg)   SpO2 98%   BMI 25.05 kg/m   BP Readings from Last 3 Encounters:  12/16/22 116/62  11/27/22 112/62  11/07/22 (!) 112/58    Wt Readings from Last 3 Encounters:  12/16/22 141  lb 6.4 oz (64.1 kg)  11/27/22 139 lb 6.4 oz (63.2 kg)  11/07/22 136 lb 6.4 oz (61.9 kg)    Physical Exam Vitals reviewed.  Constitutional:      General: She is not in acute distress.    Appearance: Normal appearance. She is normal weight. She is not ill-appearing, toxic-appearing or diaphoretic.  HENT:     Head: Normocephalic.  Eyes:     General: No scleral icterus.       Right eye: No discharge.        Left eye: No discharge.     Conjunctiva/sclera: Conjunctivae normal.  Cardiovascular:     Rate and Rhythm: Normal rate and regular rhythm.      Heart sounds: Normal heart sounds.  Pulmonary:     Effort: Pulmonary effort is normal. No respiratory distress.     Breath sounds: Normal breath sounds.  Musculoskeletal:        General: Normal range of motion.  Skin:    General: Skin is warm and dry.  Neurological:     General: No focal deficit present.     Mental Status: She is alert and oriented to person, place, and time. Mental status is at baseline.  Psychiatric:        Mood and Affect: Mood normal.        Behavior: Behavior normal.        Thought Content: Thought content normal.        Judgment: Judgment normal.    No results found for: "HGBA1C"  Lab Results  Component Value Date   CREATININE 0.87 11/07/2022   CREATININE 0.81 08/27/2022   CREATININE 0.77 04/07/2022    Lab Results  Component Value Date   WBC 5.3 11/07/2022   HGB 14.1 11/07/2022   HCT 42.8 11/07/2022   PLT 207.0 11/07/2022   GLUCOSE 89 11/07/2022   CHOL 164 08/27/2022   TRIG 56.0 08/27/2022   HDL 68.00 08/27/2022   LDLDIRECT 85.0 08/27/2022   LDLCALC 85 08/27/2022   ALT 13 11/07/2022   AST 25 11/07/2022   NA 137 11/07/2022   K 4.9 11/07/2022   CL 100 11/07/2022   CREATININE 0.87 11/07/2022   BUN 14 11/07/2022   CO2 29 11/07/2022   TSH 1.75 11/07/2022    No results found.  Assessment & Plan:  .Light-headed feeling  Feeling of chest tightness Assessment & Plan: REFERRED to PULMONARY AFTER LAST VISIT:  Recommendations from eval are summarized below, (CT done August 30, no reading yet)   Agree with PFTs  []  CT chest wo Contrast  []  Start Budesonide-Formoterol [Symbicort] 80-4.80mcg 2 puffs twice a day.  []  c/w Albuterol Inh PRN 2 puffs Q6H  []  Start Flonase 1 puff each nares daily for 30 days.      SVT (supraventricular tachycardia) Assessment & Plan: ZIO monitor reviewed:  no atrial fibrillation or runs of SVT   Mild intermittent asthma, unspecified whether complicated Assessment & Plan: Suggested by symptoms which have  improved with Symbicort MDI .  Repeat PFts are scheduled Sept 24    DNR (do not resuscitate) discussion Assessment & Plan: DNR status has been requested by patient after risks and benefits of the order were discussed today .  Order given to patient and placed in chart.       I provided  48 minutes of face-to-face time during this encounter reviewing patient's last visit with me, patient's  most recent visit with cardiology,  pulmonology, previous  surgical and non surgical  procedures, previous  labs and imaging studies, counseling on currently addressed issues,  and post visit ordering to diagnostics and therapeutics .   Follow-up: No follow-ups on file.   Sherlene Shams, MD

## 2022-12-24 ENCOUNTER — Other Ambulatory Visit: Payer: Self-pay

## 2022-12-24 DIAGNOSIS — R911 Solitary pulmonary nodule: Secondary | ICD-10-CM

## 2022-12-25 ENCOUNTER — Encounter: Payer: Self-pay | Admitting: Internal Medicine

## 2022-12-31 ENCOUNTER — Ambulatory Visit: Payer: Medicare PPO | Attending: Internal Medicine

## 2022-12-31 DIAGNOSIS — J4520 Mild intermittent asthma, uncomplicated: Secondary | ICD-10-CM

## 2022-12-31 DIAGNOSIS — R0789 Other chest pain: Secondary | ICD-10-CM

## 2022-12-31 DIAGNOSIS — J449 Chronic obstructive pulmonary disease, unspecified: Secondary | ICD-10-CM

## 2022-12-31 LAB — PULMONARY FUNCTION TEST ARMC ONLY
DL/VA % pred: 82 %
DL/VA: 3.37 ml/min/mmHg/L
DLCO unc % pred: 66 %
DLCO unc: 12.16 ml/min/mmHg
FEF 25-75 Post: 0.9 L/sec
FEF 25-75 Pre: 0.67 L/sec
FEF2575-%Change-Post: 33 %
FEF2575-%Pred-Post: 66 %
FEF2575-%Pred-Pre: 49 %
FEV1-%Change-Post: 10 %
FEV1-%Pred-Post: 95 %
FEV1-%Pred-Pre: 86 %
FEV1-Post: 1.76 L
FEV1-Pre: 1.59 L
FEV1FVC-%Change-Post: 6 %
FEV1FVC-%Pred-Pre: 78 %
FEV6-%Change-Post: 2 %
FEV6-%Pred-Post: 117 %
FEV6-%Pred-Pre: 114 %
FEV6-Post: 2.76 L
FEV6-Pre: 2.69 L
FEV6FVC-%Change-Post: 0 %
FEV6FVC-%Pred-Post: 104 %
FEV6FVC-%Pred-Pre: 104 %
FVC-%Change-Post: 3 %
FVC-%Pred-Post: 113 %
FVC-%Pred-Pre: 109 %
FVC-Post: 2.83 L
FVC-Pre: 2.74 L
Post FEV1/FVC ratio: 62 %
Post FEV6/FVC ratio: 99 %
Pre FEV1/FVC ratio: 58 %
Pre FEV6/FVC Ratio: 98 %
RV % pred: 101 %
RV: 2.38 L
TLC % pred: 101 %
TLC: 4.96 L

## 2022-12-31 MED ORDER — ALBUTEROL SULFATE (2.5 MG/3ML) 0.083% IN NEBU
2.5000 mg | INHALATION_SOLUTION | Freq: Once | RESPIRATORY_TRACT | Status: AC
  Administered 2022-12-31: 2.5 mg via RESPIRATORY_TRACT
  Filled 2022-12-31: qty 3

## 2023-01-01 DIAGNOSIS — H903 Sensorineural hearing loss, bilateral: Secondary | ICD-10-CM | POA: Diagnosis not present

## 2023-01-01 DIAGNOSIS — H6983 Other specified disorders of Eustachian tube, bilateral: Secondary | ICD-10-CM | POA: Diagnosis not present

## 2023-01-09 ENCOUNTER — Ambulatory Visit: Payer: Medicare PPO | Attending: Nurse Practitioner | Admitting: Nurse Practitioner

## 2023-01-09 ENCOUNTER — Encounter: Payer: Self-pay | Admitting: Nurse Practitioner

## 2023-01-09 VITALS — BP 115/60 | HR 61 | Ht 63.0 in | Wt 140.0 lb

## 2023-01-09 DIAGNOSIS — R002 Palpitations: Secondary | ICD-10-CM

## 2023-01-09 DIAGNOSIS — I471 Supraventricular tachycardia, unspecified: Secondary | ICD-10-CM

## 2023-01-09 DIAGNOSIS — E782 Mixed hyperlipidemia: Secondary | ICD-10-CM | POA: Diagnosis not present

## 2023-01-09 NOTE — Progress Notes (Signed)
Office Visit    Patient Name: Alison Bradshaw Date of Encounter: 01/09/2023  Primary Care Provider:  Sherlene Shams, MD Primary Cardiologist:  Lorine Bears, MD  Chief Complaint    82 y.o. female with history of atypical chest pain, nonobstructive CAD, PSVT, hyperlipidemia, and GERD, who presents for palpitations.  Past Medical History    Past Medical History:  Diagnosis Date   Anxiety    Asthma    Atypical chest pain    Barrett's esophagus    Chronic gastritis    Diverticulosis    Duodenal ulcer    Gastritis    GERD (gastroesophageal reflux disease)    Hiatal hernia    History of echocardiogram    a. 08/2020 Echo: EF 60-65%, no rwma, Nl RV fxn, mild MR.   Hyperlipidemia    Non-obstructive CAD (coronary artery disease)    a. 10/2017 Stress Echo: Nl EF, no ischemia. Mild MR; b. 12/2017 Cardiac CTA: LM nl, LAD 50p, 30d, D1/2 nl, RI nl, LCX nl, OM1/2 nl, RCA nl, RPDA/RPL nl. Ca2+ score = 39 (46th percentile)-->statin dose escalated; c. 10/2021 MV: No ischemia/infarct. Nl LV fxn.   Osteopenia    Peptic ulcer, site unspecified, unspecified as acute or chronic, without hemorrhage or perforation 11/19/2012   Formatting of this note might be different from the original.  duodenum 1966  Formatting of this note might be different from the original.  Overview:   duodenum 1966   PSVT (paroxysmal supraventricular tachycardia) (HCC)    a. 10/2017 Holter: PSVT, max 5 beats->did not tolerate beta blocker; b. 09/2020 Zio: 38 runs of SVT. Longest 18.1 secs (106), fastest 203 bpm (6 beats). Occas PVCs (1.1%) - some assoc w/ triggered events; C. 11/2022 Zio: Predominantly sinus rhythm @ 69 (48-182).  34 runs of PSVT, longest 12 beats.  3.3% PVC burden.  No sustained arrhythmias or A-fib.   Pulmonary nodules    a. 12/2017 Cardiac CTA: RLL 9mm nodule; b. 04/2018 RLL 9mm nodule resolved; c. 09/2018 CT Chest: Previously noted posterior RUL nodules almost completely resolved. Bandlike scarring of RML  and lingula-->atyp infxn; d. 11/2022 CT Chest: Clustered bilateral solid pulmonary nodules and groundglass opacities, most pronounced in posterior RUL, RML, and lingula - ? chronic atyp infxn. New 6mm RLL nodule.   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   BREAST BIOPSY Left 2008   neg   BREAST EXCISIONAL BIOPSY Left 2004   neg   CATARACT EXTRACTION W/ INTRAOCULAR LENS  IMPLANT, BILATERAL Bilateral    COLONOSCOPY WITH PROPOFOL N/A 01/22/2017   Procedure: COLONOSCOPY WITH PROPOFOL;  Surgeon: Toledo, Boykin Nearing, MD;  Location: [facility] ENDOSCOPY;  Service: Gastroenterology;  Laterality: N/A;   COLONOSCOPY WITH PROPOFOL N/A 03/26/2022   Procedure: COLONOSCOPY WITH PROPOFOL;  Surgeon: Regis Bill, MD;  Location: [facility] ENDOSCOPY;  Service: Endoscopy;  Laterality: N/A;   ESOPHAGOGASTRODUODENOSCOPY (EGD) WITH PROPOFOL N/A 01/20/2015   Procedure: ESOPHAGOGASTRODUODENOSCOPY (EGD) WITH PROPOFOL;  Surgeon: Christena Deem, MD;  Location: [facility] ENDOSCOPY;  Service: Endoscopy;  Laterality: N/A;   ESOPHAGOGASTRODUODENOSCOPY (EGD) WITH PROPOFOL N/A 01/22/2017   Procedure: ESOPHAGOGASTRODUODENOSCOPY (EGD) WITH PROPOFOL;  Surgeon: Toledo, Boykin Nearing, MD;  Location: [facility] ENDOSCOPY;  Service: Gastroenterology;  Laterality: N/A;   ESOPHAGOGASTRODUODENOSCOPY (EGD) WITH PROPOFOL N/A 04/14/2018   Procedure: ESOPHAGOGASTRODUODENOSCOPY (EGD) WITH PROPOFOL;  Surgeon: Christena Deem, MD;  Location: [facility] ENDOSCOPY;  Service: Endoscopy;  Laterality: N/A;   ESOPHAGOGASTRODUODENOSCOPY (EGD) WITH PROPOFOL N/A 10/16/2021   Procedure: ESOPHAGOGASTRODUODENOSCOPY (EGD) WITH PROPOFOL;   Surgeon: Regis Bill, MD;  Location: [facility] ENDOSCOPY;  Service: Endoscopy;  Laterality: N/A;   EYE SURGERY Right    laser scraping   OPEN REDUCTION INTERNAL FIXATION (ORIF) DISTAL RADIAL FRACTURE Right 02/09/2018   Procedure: OPEN REDUCTION INTERNAL FIXATION (ORIF) DISTAL RADIAL FRACTURE;  Surgeon: Kennedy Bucker, MD;  Location: [facility] ORS;  Service: Orthopedics;  Laterality: Right;   TONSILLECTOMY      Allergies  Allergies  Allergen Reactions   Percocet [Oxycodone-Acetaminophen] Nausea Only   Ceftin [Cefuroxime Axetil] Hives and Rash   Timentin [Ticarcillin-Pot Clavulanate] Hives and Rash    History of Present Illness      82 y.o. y/o female with above past medical history including atypical chest pain, nonobstructive CAD, PSVT, hyperlipidemia, and GERD. She was evaluated in 2019 for atypical chest pain with echo showing normal LV function. Coronary CT angiogram in September 2019, showed a calcium score of 39 with mild nonobstructive CAD in the LAD distribution. At the time, she also reported palpitations, and a Holter monitor showed short runs of SVT. She tried beta-blocker therapy but this was discontinued secondary to complaints of dizziness. In April 2022, she reported palpitations and presyncope. Echo in May 2022 showed an EF of 60 to 65% without regional wall motion abnormalities, normal RV function, and mild MR. Zio monitoring in June 2022, showed 38 runs of SVT, the longest of which was 18.1 seconds at a rate of 106 bpm. Fastest episode consisted of 6 beats at 203 bpm. She was also noted to have occasional PVCs at a 1.1% burden. Recommendation was made to consider low-dose metoprolol however, we agreed to defer in the setting of baseline bradycardia and soft blood pressures. In the spring 2023, she began to experience intermittent abdominal discomfort that radiated into her precordium and throat. She was initially seen in the emergency department with unremarkable work-up and then was  seen in clinic here. She underwent a Lexiscan Myoview which showed normal LV function and no evidence of ischemia or infarct. No significant aortic or coronary calcifications were noted.   Ms. Schmelzer multiple emergency department visits for abdominal discomfort.  Initially, imaging was unremarkable, and she was treated for UTI without improvement in symptoms.  Pelvic ultrasound showed possible thickening of the endometrium however, subsequent biopsy was negative.  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  She had worsening right lower quadrant pain and tenderness on April 07, 2022, and repeat CT suggested possible acute appendicitis.  She was seen by general surgery.  It was felt that presentation was not consistent with acute appendicitis.  Patient was not interested in diagnostic laparoscopy.  She was also seen by vascular surgery due to concern for mesenteric ischemia however, mesenteric duplex showed normal velocities in the celiac, superior mesenteric, and inferior mesenteric arteries.  Ms. Devincentis saw pulmonology in 11/2022 due to dyspnea and sneezing w/ prior abnormal chest CT.  F/u CT showed clustered bilateral pulmonary nodules and groundglass opacities, slightly worsened compared to prior exam and labs consistent with chronic atypical infection.  There was a new irregular solid pulmonary nodule in the right lower lobe measuring 6 mm with recommendation for  follow-up in 3 to 6 months.  In the setting of palpitations, Zio monitoring was also placed in August 2024, which showed predominantly sinus rhythm with 34 brief runs of non-sustained SVT, and 3.3% PVC burden.  No A-fib or sustained arrhythmias were noted.   She notes that with the weather getting cooler, she has been feeling much better with improved energy and no recent palpitations.  In discussing her palpitations today, she notes that they are typically worse over the summer months and associated with profound fatigue.  She  thinks that she was having some symptoms when she wore her recent Zio but no profound symptoms like she experienced earlier in the summer.  She denies chest pain, dyspnea, PND, orthopnea, dizziness, syncope, edema, or early satiety.  Home Medications    Current Outpatient Medications  Medication Sig Dispense Refill   acetaminophen (TYLENOL) 500 MG tablet Take 1,000 mg by mouth every 6 (six) hours as needed.     albuterol (VENTOLIN HFA) 108 (90 Base) MCG/ACT inhaler Inhale 2 puffs into the lungs every 6 (six) hours as needed for shortness of breath. 3 each 0   ALPRAZolam (XANAX) 0.25 MG tablet TAKE 1 TABLET(0.25 MG) BY MOUTH AT BEDTIME AS NEEDED FOR ANXIETY 30 tablet 5   azelastine (ASTELIN) 0.1 % nasal spray Place 1-2 sprays into both nostrils 2 (two) times daily.     budesonide-formoterol (SYMBICORT) 80-4.5 MCG/ACT inhaler Inhale 2 puffs into the lungs every 6 (six) hours. 1 each 12   pantoprazole (PROTONIX) 40 MG tablet Take 1 tablet (40 mg total) by mouth daily. 90 tablet 3   rosuvastatin (CRESTOR) 10 MG tablet Take 1 tablet (10 mg total) by mouth daily. 90 tablet 1   sucralfate (CARAFATE) 1 G tablet Take 1 g by mouth in the morning, at noon, and at bedtime. Pt only takes when she's really needs it which isn't very often     No current facility-administered medications for this visit.     Review of Systems    Currently she is noted improvement in palpitations and energy levels.  She denies chest pain, dyspnea, PND, orthopnea, dizziness, syncope, edema, or early satiety.  All other systems reviewed and are otherwise negative except as noted above.    Physical Exam    VS:  BP 115/60 (BP Location: Left Arm, Patient Position: Sitting, Cuff Size: Normal)   Pulse 61   Ht 5\' 3"  (1.6 m)   Wt 140 lb (63.5 kg)   SpO2 98%   BMI 24.80 kg/m  , BMI Body mass index is 24.8 kg/m.     GEN: Well nourished, well developed, in no acute distress. HEENT: normal. Neck: Supple, no JVD, carotid bruits,  or masses. Cardiac: RRR, no murmurs, rubs, or gallops. No clubbing, cyanosis, edema.  Radials 2+/PT 2+ and equal bilaterally.  Respiratory:  Respirations regular and unlabored, clear to auscultation bilaterally. GI: Soft, nontender, nondistended, BS + x 4. MS: no deformity or atrophy. Skin: warm and dry, no rash. Neuro:  Strength and sensation are intact. Psych: Normal affect.  Accessory Clinical Findings    Lab Results  Component Value Date   WBC 5.3 11/07/2022   HGB 14.1 11/07/2022   HCT 42.8 11/07/2022   MCV 89.3 11/07/2022   PLT 207.0 11/07/2022   Lab Results  Component Value Date   CREATININE 0.87 11/07/2022   BUN 14 11/07/2022   NA 137 11/07/2022   K 4.9 11/07/2022   CL 100 11/07/2022   CO2 29 11/07/2022  Lab Results  Component Value Date   ALT 13 11/07/2022   AST 25 11/07/2022   ALKPHOS 77 11/07/2022   BILITOT 0.9 11/07/2022   Lab Results  Component Value Date   CHOL 164 08/27/2022   HDL 68.00 08/27/2022   LDLCALC 85 08/27/2022   LDLDIRECT 85.0 08/27/2022   TRIG 56.0 08/27/2022   CHOLHDL 2 08/27/2022    Assessment & Plan    1.  PSVT/palpitations: Patient noted increasing frequency of palpitations over the summer associated with profound fatigue.  She recently wore an event monitor which showed predominantly sinus rhythm and 34 brief runs of PSVT, the fastest at 182 bpm with the longest of 12 seconds.  She says that she did not have as profound of episodes that she was having earlier in the summer.  Triggered events were mostly associated with sinus rhythm though 1 triggered event was associated with ventricular bigeminy.  She has been feeling better since the weather has turned cooler and has noted improved energy and no recent palpitations.  She is considering buying a Kardia mobile device.  Continue conservative management.  She is not on AV nodal blocking agent due to history of baseline bradycardia.  2.  History of chest pain/nonobstructive CAD: Moderate,  nonobstructive CAD noted on coronary CT angiogram in September 2019.  Low risk stress test and 2023.  Not currently experiencing chest pain.  Remains on statin therapy.  3.  Hyperlipidemia: On rosuvastatin with an LDL of 85 in May.  LFTs were normal in August.  4.  PVCs: 3.3% PVC burden on recent ZIO monitor.  With the exception of an episode of bigeminy, she appears to have been asymptomatic.  As above, no AV nodal blocking agent due to history of baseline bradycardia.  Nonischemic stress testing in 2023 with normal LV function by echo in 2022.    5.  Disposition: Follow-up in 1 year or sooner if necessary.  Nicolasa Ducking, NP 01/09/2023, 4:35 PM

## 2023-01-09 NOTE — Patient Instructions (Signed)
Medication Instructions:  No changes *If you need a refill on your cardiac medications before your next appointment, please call your pharmacy*   Lab Work: None ordered If you have labs (blood work) drawn today and your tests are completely normal, you will receive your results only by: MyChart Message (if you have MyChart) OR A paper copy in the mail If you have any lab test that is abnormal or we need to change your treatment, we will call you to review the results.   Testing/Procedures: None ordered   Follow-Up: At [facility], you and your health needs are our priority.  As part of our continuing mission to provide you with exceptional heart care, we have created designated Provider Care Teams.  These Care Teams include your primary Cardiologist (physician) and Advanced Practice Providers (APPs -  Physician Assistants and Nurse Practitioners) who all work together to provide you with the care you need, when you need it.  We recommend signing up for the patient portal called "MyChart".  Sign up information is provided on this After Visit Summary.  MyChart is used to connect with patients for Virtual Visits (Telemedicine).  Patients are able to view lab/test results, encounter notes, upcoming appointments, etc.  Non-urgent messages can be sent to your provider as well.   To learn more about what you can do with MyChart, go to [email or web address].    Your next appointment:   12 month(s)  Provider:   You may see Lorine Bears, MD or one of the following Advanced Practice Providers on your designated Care Team:   Nicolasa Ducking, NP

## 2023-01-12 ENCOUNTER — Encounter: Payer: Self-pay | Admitting: Internal Medicine

## 2023-01-16 DIAGNOSIS — M51369 Other intervertebral disc degeneration, lumbar region without mention of lumbar back pain or lower extremity pain: Secondary | ICD-10-CM | POA: Diagnosis not present

## 2023-01-16 DIAGNOSIS — M545 Low back pain, unspecified: Secondary | ICD-10-CM | POA: Diagnosis not present

## 2023-01-16 DIAGNOSIS — M533 Sacrococcygeal disorders, not elsewhere classified: Secondary | ICD-10-CM | POA: Diagnosis not present

## 2023-01-16 DIAGNOSIS — M47816 Spondylosis without myelopathy or radiculopathy, lumbar region: Secondary | ICD-10-CM | POA: Diagnosis not present

## 2023-01-16 DIAGNOSIS — M62830 Muscle spasm of back: Secondary | ICD-10-CM | POA: Diagnosis not present

## 2023-01-22 ENCOUNTER — Encounter: Payer: Self-pay | Admitting: Internal Medicine

## 2023-01-22 ENCOUNTER — Ambulatory Visit: Payer: Medicare PPO | Admitting: Internal Medicine

## 2023-01-22 VITALS — BP 112/70 | HR 60 | Temp 97.4°F | Ht 63.0 in | Wt 140.2 lb

## 2023-01-22 DIAGNOSIS — R002 Palpitations: Secondary | ICD-10-CM

## 2023-01-22 DIAGNOSIS — H938X1 Other specified disorders of right ear: Secondary | ICD-10-CM | POA: Insufficient documentation

## 2023-01-22 DIAGNOSIS — J300 Vasomotor rhinitis: Secondary | ICD-10-CM | POA: Insufficient documentation

## 2023-01-22 DIAGNOSIS — J4520 Mild intermittent asthma, uncomplicated: Secondary | ICD-10-CM | POA: Diagnosis not present

## 2023-01-22 MED ORDER — PREDNISONE 10 MG PO TABS: ORAL_TABLET | ORAL | 0 refills | Status: DC

## 2023-01-22 MED ORDER — IPRATROPIUM BROMIDE 0.06 % NA SOLN: 2.0000 | Freq: Four times a day (QID) | NASAL | 12 refills | Status: DC

## 2023-01-22 NOTE — Patient Instructions (Addendum)
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  Your lung volumes did not completely support a diagnosis of emphysema , but they also  did not improve after inhaling  the bronchodilator they gave you (which would be expected to improve with asthma)   Stop astelin for now  Start atrovent 2 squirts on each side  4 times daily (for post nasal drip  caused by vasomotor rhinitis )  Afrin 1 spray  on  right nostril  every 12 hours for 5 days  then STOP .  This is to openn the Eustachian tube on the right   Prednisone taper for 6 days  to treat any inflammation that may be in the eustachian

## 2023-01-22 NOTE — Assessment & Plan Note (Signed)
Presumed, given lack of improvement with astelin and claritin.  Trial of atrovent

## 2023-01-22 NOTE — Progress Notes (Signed)
Subjective:  Patient ID: Alison Bradshaw, female    DOB: Aug 22, 1940  Age: 82 y.o. MRN: [id number]  CC: The primary encounter diagnosis was Mild intermittent asthma, unspecified whether complicated. Diagnoses of Palpitations, Ear pressure, right, and Vasomotor rhinitis were also pertinent to this visit.   HPI Alison Bradshaw presents for  Chief Complaint  Patient presents with   Ear Fullness    Right ear pressure   Follow up on multiple issues  1)  Has been having sense of pressure in the right ear for about 8 weeks . Has been ACCOMPANIED by sinus congestion, chronic  PND   and sudden transient loss of hearing. On the right.  She  saw [facility].  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   Using astelin nasal spray for the past month.  No difference in the PND.     "Breathing issue,  tight chested and feeling faint:  has had cardiology and pulmonology  evaluation.   PFTS noted obstructive patter bt did not meet criteria for emphysema and asthma .  Has been using symbicort for the past month, prescribed by pulmonogly,  and astelin. not tolerating allegra /zyrtec  No fevers,  sinus congestion body aches.  COVID TESTED MULTIPLE  TIMES AND ALWAYS NEGATIVE      Outpatient Medications Prior to Visit  Medication Sig Dispense Refill   acetaminophen (TYLENOL) 500 MG tablet Take 1,000 mg by mouth every 6 (six) hours as needed.     albuterol (VENTOLIN HFA) 108 (90 Base) MCG/ACT inhaler Inhale 2 puffs into the lungs every 6 (six) hours as needed for shortness of breath. 3 each 0   ALPRAZolam (XANAX) 0.25 MG tablet TAKE 1 TABLET(0.25 MG) BY MOUTH AT BEDTIME AS NEEDED FOR ANXIETY 30 tablet 5   azelastine (ASTELIN) 0.1 % nasal spray Place 1-2 sprays into both nostrils 2 (two) times daily.     budesonide-formoterol (SYMBICORT) 80-4.5 MCG/ACT inhaler Inhale 2 puffs into the lungs every 6 (six) hours. 1 each 12   pantoprazole (PROTONIX) 40 MG  tablet Take 1 tablet (40 mg total) by mouth daily. 90 tablet 3   rosuvastatin (CRESTOR) 10 MG tablet Take 1 tablet (10 mg total) by mouth daily. 90 tablet 1   sucralfate (CARAFATE) 1 G tablet Take 1 g by mouth in the morning, at noon, and at bedtime. Pt only takes when she's really needs it which isn't very often     No facility-administered medications prior to visit.    Review of Systems;  Patient denies headache, fevers, malaise, unintentional weight loss, skin rash, eye pain, sinus congestion and sinus pain, sore throat, dysphagia,  hemoptysis , cough, dyspnea, wheezing, chest pain, palpitations, orthopnea, edema, abdominal pain, nausea, melena, diarrhea, constipation, flank pain, dysuria, hematuria, urinary  Frequency, nocturia, numbness, tingling, seizures,  Focal weakness, Loss of consciousness,  Tremor, insomnia, depression, anxiety, and suicidal ideation.      Objective:  BP 112/70   Pulse 60   Temp (!) 97.4 F (36.3 C) (Oral)   Ht 5\' 3"  (1.6 m)   Wt 140 lb 3.2 oz (63.6 kg)   SpO2 98%   BMI 24.84 kg/m   BP Readings from Last 3 Encounters:  01/22/23 112/70  01/09/23 115/60  12/16/22 116/62    Wt Readings from Last 3 Encounters:  01/22/23 140 lb 3.2 oz (63.6 kg)  01/09/23 140 lb (63.5 kg)  12/16/22 141 lb 6.4 oz (64.1 kg)  Physical Exam Vitals reviewed.  Constitutional:      General: She is not in acute distress.    Appearance: Normal appearance. She is normal weight. She is not ill-appearing, toxic-appearing or diaphoretic.  HENT:     Head: Normocephalic.     Ears:     Comments: Right TM opaque . Eyes:     General: No scleral icterus.       Right eye: No discharge.        Left eye: No discharge.     Conjunctiva/sclera: Conjunctivae normal.  Cardiovascular:     Rate and Rhythm: Normal rate and regular rhythm.     Heart sounds: Normal heart sounds.  Pulmonary:     Effort: Pulmonary effort is normal. No respiratory distress.     Breath sounds: Normal  breath sounds.  Musculoskeletal:        General: Normal range of motion.  Skin:    General: Skin is warm and dry.  Neurological:     General: No focal deficit present.     Mental Status: She is alert and oriented to person, place, and time. Mental status is at baseline.  Psychiatric:        Mood and Affect: Mood normal.        Behavior: Behavior normal.        Thought Content: Thought content normal.        Judgment: Judgment normal.     No results found for: "HGBA1C"  Lab Results  Component Value Date   CREATININE 0.87 11/07/2022   CREATININE 0.81 08/27/2022   CREATININE 0.77 04/07/2022    Lab Results  Component Value Date   WBC 5.3 11/07/2022   HGB 14.1 11/07/2022   HCT 42.8 11/07/2022   PLT 207.0 11/07/2022   GLUCOSE 89 11/07/2022   CHOL 164 08/27/2022   TRIG 56.0 08/27/2022   HDL 68.00 08/27/2022   LDLDIRECT 85.0 08/27/2022   LDLCALC 85 08/27/2022   ALT 13 11/07/2022   AST 25 11/07/2022   NA 137 11/07/2022   K 4.9 11/07/2022   CL 100 11/07/2022   CREATININE 0.87 11/07/2022   BUN 14 11/07/2022   CO2 29 11/07/2022   TSH 1.75 11/07/2022    CT CHEST WO CONTRAST  Result Date: 12/17/2022 CLINICAL DATA:  Shortness of breath, chest congestion, and chest tightness EXAM: CT CHEST WITHOUT CONTRAST TECHNIQUE: Multidetector CT imaging of the chest was performed following the standard protocol without IV contrast. RADIATION DOSE REDUCTION: This exam was performed according to the departmental dose-optimization program which includes automated exposure control, adjustment of the mA and/or kV according to patient size and/or use of iterative reconstruction technique. COMPARISON:  CTA chest dated September 19, 2021 FINDINGS: Cardiovascular: Normal heart size. No pericardial effusion. Normal caliber thoracic aorta with mild calcified plaque. Mild coronary artery calcifications. Mediastinum/Nodes: Esophagus and thyroid are unremarkable. No enlarged lymph nodes seen in the chest.  Lungs/Pleura: Central airways are patent. Stable right-greater-than-left biapical pleural-parenchymal scarring. Clustered bilateral solid pulmonary nodules and ground-glass opacities most pronounced in the posterior right upper lobe, right middle lobe and lingula, slightly worsened when compared with prior exam with some pulmonary nodules increased in size and others new. New irregular solid pulmonary nodule of the right lower lobe measuring 6 mm on series 3, image 78. Subpleural solid nodule of the right upper lobe is increased in size measuring 6 mm on image 47, previously 4 mm. No pleural effusion or pneumothorax. Upper Abdomen: No acute abnormality. Musculoskeletal: No chest wall  mass or suspicious bone lesions identified. IMPRESSION: 1. Clustered bilateral solid pulmonary nodules and ground-glass opacities most pronounced in the posterior right upper lobe, right middle lobe and lingula, slightly worsened when compared with prior exam with some pulmonary nodules increased in size and others new. 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 2. New irregular solid pulmonary nodule of the right lower lobe measuring 6 mm. Non-contrast chest CT at 3-6 months is recommended. If the nodules are stable at time of repeat CT, then future CT at 18-24 months (from today's scan) is considered optional for low-risk patients, but is recommended for high-risk patients. This recommendation follows the consensus statement: Guidelines for Management of Incidental Pulmonary Nodules Detected on CT Images: From the Fleischner Society 2017; Radiology 2017; 284:228-243. 3. Mild coronary artery calcifications and aortic Atherosclerosis (ICD10-I70.0). Electronically Signed   By: Allegra Lai M.D.   On: 12/17/2022 10:14    Assessment & Plan:  .Mild intermittent asthma, unspecified whether complicated Assessment & Plan: Awaiting definitive diagnosis.Marland Kitchen continue symbicort     Palpitations Assessment & Plan: Reviewed results of ZIO monitor:  normal    Ear pressure, right Assessment & Plan: Will treat for Eustachian tube dysfunction with prednisone,  afrin   Vasomotor rhinitis Assessment & Plan: Presumed, given lack of improvement with astelin and claritin.  Trial of atrovent    Other orders -     Ipratropium Bromide; Place 2 sprays into both nostrils 4 (four) times daily.  Dispense: 15 mL; Refill: 12 -     predniSONE; 6 tablets on Day 1 , then reduce by 1 tablet daily until gone  Dispense: 21 tablet; Refill: 0     I provided 48 minutes of face-to-face time during this encounter reviewing patient's last visit with me, patient's  most recent visit with cardiology,  pulmonology,   recent surgical and non surgical procedures, previous  labs, functional  and imaging studies, counseling on currently addressed issues,  and post visit ordering to diagnostics and therapeutics .  Follow-up: No follow-ups on file.   Sherlene Shams, MD

## 2023-01-22 NOTE — Assessment & Plan Note (Signed)
Will treat for Eustachian tube dysfunction with prednisone,  afrin

## 2023-01-22 NOTE — Assessment & Plan Note (Signed)
Reviewed results of ZIO monitor:  normal

## 2023-01-22 NOTE — Assessment & Plan Note (Signed)
Awaiting definitive diagnosis.Marland Kitchen continue symbicort

## 2023-01-24 DIAGNOSIS — M47816 Spondylosis without myelopathy or radiculopathy, lumbar region: Secondary | ICD-10-CM | POA: Diagnosis not present

## 2023-01-24 DIAGNOSIS — M62830 Muscle spasm of back: Secondary | ICD-10-CM | POA: Diagnosis not present

## 2023-01-24 DIAGNOSIS — M545 Low back pain, unspecified: Secondary | ICD-10-CM | POA: Diagnosis not present

## 2023-01-24 DIAGNOSIS — M51369 Other intervertebral disc degeneration, lumbar region without mention of lumbar back pain or lower extremity pain: Secondary | ICD-10-CM | POA: Diagnosis not present

## 2023-01-24 DIAGNOSIS — M533 Sacrococcygeal disorders, not elsewhere classified: Secondary | ICD-10-CM | POA: Diagnosis not present

## 2023-01-24 DIAGNOSIS — M6281 Muscle weakness (generalized): Secondary | ICD-10-CM | POA: Diagnosis not present

## 2023-01-30 ENCOUNTER — Other Ambulatory Visit: Payer: Self-pay | Admitting: Internal Medicine

## 2023-01-30 ENCOUNTER — Encounter: Payer: Self-pay | Admitting: Internal Medicine

## 2023-01-30 DIAGNOSIS — B370 Candidal stomatitis: Secondary | ICD-10-CM | POA: Insufficient documentation

## 2023-01-30 MED ORDER — NYSTATIN 100000 UNIT/ML MT SUSP: 5.0000 mL | Freq: Four times a day (QID) | OROMUCOSAL | 0 refills | Status: DC

## 2023-02-03 DIAGNOSIS — M62830 Muscle spasm of back: Secondary | ICD-10-CM | POA: Diagnosis not present

## 2023-02-03 DIAGNOSIS — M545 Low back pain, unspecified: Secondary | ICD-10-CM | POA: Diagnosis not present

## 2023-02-03 DIAGNOSIS — M533 Sacrococcygeal disorders, not elsewhere classified: Secondary | ICD-10-CM | POA: Diagnosis not present

## 2023-02-03 DIAGNOSIS — M51369 Other intervertebral disc degeneration, lumbar region without mention of lumbar back pain or lower extremity pain: Secondary | ICD-10-CM | POA: Diagnosis not present

## 2023-02-03 DIAGNOSIS — M6281 Muscle weakness (generalized): Secondary | ICD-10-CM | POA: Diagnosis not present

## 2023-02-03 DIAGNOSIS — M47816 Spondylosis without myelopathy or radiculopathy, lumbar region: Secondary | ICD-10-CM | POA: Diagnosis not present

## 2023-02-05 DIAGNOSIS — K589 Irritable bowel syndrome without diarrhea: Secondary | ICD-10-CM | POA: Diagnosis not present

## 2023-02-14 ENCOUNTER — Ambulatory Visit: Payer: Medicare PPO | Admitting: Nurse Practitioner

## 2023-02-14 ENCOUNTER — Ambulatory Visit (INDEPENDENT_AMBULATORY_CARE_PROVIDER_SITE_OTHER)
Admission: RE | Admit: 2023-02-14 | Discharge: 2023-02-14 | Disposition: A | Discharge status: Home / Self Care | Payer: Medicare PPO | Source: Ambulatory Visit | Attending: Nurse Practitioner | Admitting: Nurse Practitioner

## 2023-02-14 ENCOUNTER — Encounter: Payer: Self-pay | Admitting: Nurse Practitioner

## 2023-02-14 VITALS — BP 128/70 | HR 66 | Temp 98.0°F | Ht 63.0 in | Wt 140.8 lb

## 2023-02-14 DIAGNOSIS — R0602 Shortness of breath: Secondary | ICD-10-CM | POA: Insufficient documentation

## 2023-02-14 DIAGNOSIS — R051 Acute cough: Secondary | ICD-10-CM | POA: Insufficient documentation

## 2023-02-14 DIAGNOSIS — J0140 Acute pansinusitis, unspecified: Secondary | ICD-10-CM | POA: Insufficient documentation

## 2023-02-14 DIAGNOSIS — R059 Cough, unspecified: Secondary | ICD-10-CM | POA: Diagnosis not present

## 2023-02-14 LAB — POC COVID19 BINAXNOW: SARS Coronavirus 2 Ag: NEGATIVE

## 2023-02-14 MED ORDER — DOXYCYCLINE HYCLATE 100 MG PO TABS: 100.0000 mg | ORAL_TABLET | Freq: Two times a day (BID) | ORAL | 0 refills | Status: AC

## 2023-02-14 MED ORDER — BENZONATATE 100 MG PO CAPS: 100.0000 mg | ORAL_CAPSULE | Freq: Three times a day (TID) | ORAL | 0 refills | Status: DC | PRN

## 2023-02-14 NOTE — Assessment & Plan Note (Signed)
Pending chest x-ray patient directed to start back on her Symbicort and albuterol as needed.  O2 sat within normal limits in office.  Patient is lungs clear to auscultation

## 2023-02-14 NOTE — Progress Notes (Signed)
Acute Office Visit  Subjective:     Patient ID: Alison Bradshaw, female    DOB: 13-Mar-1941, 82 y.o.   MRN: [id number]  Chief Complaint  Patient presents with   Nasal Congestion    Sneezing, cream colored mucus    Cough    Pt complains of having mucus and gagging started 6 weeks ago. Pt complains of cough lasting a week.    Respiratory Distress    Pt complains of heavy chest feeling, complains of only able to take shallow breaths.  Tight and pressure feeling but no pain.    Otitis Media     Patient is in today for sick symptoms with a history of SVT, Granulomatous lung disease, Asthma,   Symptoms started approx 2 months or longer. States that she has been evaluated in the past 2 months. States that she saw ENT and did afrin for 5 days with some relief. Stats that after that she started having chest congestion, gagging, and mucous from the head. States that she was given some Atrovent nasal and short course of steroids. States that it has gotten worse.  States that she stopped the Atrovent and Symbicort 4 days ago. States that she has been using the albuterol inhaler without great relief    Review of Systems  Constitutional:  Positive for chills and malaise/fatigue. Negative for fever.  HENT:  Positive for ear pain (full). Negative for ear discharge, sinus pain and sore throat.   Respiratory:  Positive for cough, sputum production (tan) and shortness of breath (rest and movemetn).   Gastrointestinal:  Negative for abdominal pain, diarrhea, nausea and vomiting.  Musculoskeletal:  Positive for myalgias.  Neurological:  Positive for headaches.        Objective:    BP 128/70   Pulse 66   Temp 98 F (36.7 C) (Oral)   Ht 5\' 3"  (1.6 m)   Wt 140 lb 12.8 oz (63.9 kg)   SpO2 98%   BMI 24.94 kg/m  BP Readings from Last 3 Encounters:  02/14/23 128/70  01/22/23 112/70  01/09/23 115/60   Wt Readings from Last 3 Encounters:  02/14/23 140 lb 12.8 oz (63.9 kg)  01/22/23 140  lb 3.2 oz (63.6 kg)  01/09/23 140 lb (63.5 kg)   SpO2 Readings from Last 3 Encounters:  02/14/23 98%  01/22/23 98%  01/09/23 98%      Physical Exam Vitals and nursing note reviewed.  Constitutional:      Appearance: Normal appearance.  HENT:     Right Ear: Tympanic membrane, ear canal and external ear normal.     Left Ear: Tympanic membrane, ear canal and external ear normal.     Nose:     Right Sinus: Maxillary sinus tenderness and frontal sinus tenderness present.     Left Sinus: Maxillary sinus tenderness and frontal sinus tenderness present.     Comments: Frontal > maxillary    Mouth/Throat:     Mouth: Mucous membranes are moist.     Comments: Cobblestoning  Cardiovascular:     Rate and Rhythm: Normal rate and regular rhythm.     Heart sounds: Normal heart sounds.  Pulmonary:     Effort: Pulmonary effort is normal.     Breath sounds: Normal breath sounds.  Lymphadenopathy:     Cervical: No cervical adenopathy.  Neurological:     Mental Status: She is alert.     No results found for any visits on 02/14/23.      Assessment &  Plan:   Problem List Items Addressed This Visit       Respiratory   Acute non-recurrent pansinusitis - Primary    She with doxycycline 100 mg twice daily.  COVID test negative in office follow-up with primary care if no improvement      Relevant Medications   doxycycline (VIBRA-TABS) 100 MG tablet   benzonatate (TESSALON) 100 MG capsule     Other   Acute cough    Likely secondary to PND.  Patient has prescription for Atrovent at home seem to beneficial she has stopped it make it was causing her symptoms to be worse encouraged her to restart medication.  COVID test in office      Relevant Medications   benzonatate (TESSALON) 100 MG capsule   Other Relevant Orders   DG Chest 2 View   POC COVID-19   Shortness of breath    Pending chest x-ray patient directed to start back on her Symbicort and albuterol as needed.  O2 sat within  normal limits in office.  Patient is lungs clear to auscultation      Relevant Orders   DG Chest 2 View    Meds ordered this encounter  Medications   doxycycline (VIBRA-TABS) 100 MG tablet    Sig: Take 1 tablet (100 mg total) by mouth 2 (two) times daily for 7 days.    Dispense:  14 tablet    Refill:  0    Order Specific Question:   Supervising Provider    Answer:   Milinda Antis MARNE A [[id number]]   benzonatate (TESSALON) 100 MG capsule    Sig: Take 1 capsule (100 mg total) by mouth 3 (three) times daily as needed for cough.    Dispense:  21 capsule    Refill:  0    Order Specific Question:   Supervising Provider    Answer:   TOWER, MARNE A [[id number]]    Return if symptoms worsen or fail to improve.  Audria Nine, NP

## 2023-02-14 NOTE — Assessment & Plan Note (Addendum)
Likely secondary to PND.  Patient has prescription for Atrovent at home seem to beneficial she has stopped it make it was causing her symptoms to be worse encouraged her to restart medication.  COVID test in office

## 2023-02-14 NOTE — Assessment & Plan Note (Signed)
She with doxycycline 100 mg twice daily.  COVID test negative in office follow-up with primary care if no improvement

## 2023-02-14 NOTE — Patient Instructions (Signed)
Nice to see you today I have sent in antibiotics and cough medication to the pharmacy Follow up if you do not improve Start back on the symbicort

## 2023-02-18 DIAGNOSIS — M62830 Muscle spasm of back: Secondary | ICD-10-CM | POA: Diagnosis not present

## 2023-02-18 DIAGNOSIS — F411 Generalized anxiety disorder: Secondary | ICD-10-CM | POA: Diagnosis not present

## 2023-02-18 DIAGNOSIS — M47816 Spondylosis without myelopathy or radiculopathy, lumbar region: Secondary | ICD-10-CM | POA: Diagnosis not present

## 2023-02-18 DIAGNOSIS — J301 Allergic rhinitis due to pollen: Secondary | ICD-10-CM | POA: Diagnosis not present

## 2023-02-18 DIAGNOSIS — J4489 Other specified chronic obstructive pulmonary disease: Secondary | ICD-10-CM | POA: Diagnosis not present

## 2023-02-18 DIAGNOSIS — K581 Irritable bowel syndrome with constipation: Secondary | ICD-10-CM | POA: Diagnosis not present

## 2023-02-18 DIAGNOSIS — M51369 Other intervertebral disc degeneration, lumbar region without mention of lumbar back pain or lower extremity pain: Secondary | ICD-10-CM | POA: Diagnosis not present

## 2023-02-18 DIAGNOSIS — M1990 Unspecified osteoarthritis, unspecified site: Secondary | ICD-10-CM | POA: Diagnosis not present

## 2023-02-18 DIAGNOSIS — I739 Peripheral vascular disease, unspecified: Secondary | ICD-10-CM | POA: Diagnosis not present

## 2023-02-18 DIAGNOSIS — E785 Hyperlipidemia, unspecified: Secondary | ICD-10-CM | POA: Diagnosis not present

## 2023-02-18 DIAGNOSIS — M533 Sacrococcygeal disorders, not elsewhere classified: Secondary | ICD-10-CM | POA: Diagnosis not present

## 2023-02-18 DIAGNOSIS — K219 Gastro-esophageal reflux disease without esophagitis: Secondary | ICD-10-CM | POA: Diagnosis not present

## 2023-02-18 DIAGNOSIS — M545 Low back pain, unspecified: Secondary | ICD-10-CM | POA: Diagnosis not present

## 2023-02-18 DIAGNOSIS — M6281 Muscle weakness (generalized): Secondary | ICD-10-CM | POA: Diagnosis not present

## 2023-02-25 ENCOUNTER — Ambulatory Visit: Payer: Medicare PPO | Admitting: Pulmonary Disease

## 2023-02-25 ENCOUNTER — Other Ambulatory Visit
Admission: RE | Admit: 2023-02-25 | Discharge: 2023-02-25 | Disposition: A | Discharge status: Home / Self Care | Payer: Medicare PPO | Source: Ambulatory Visit | Attending: Pulmonary Disease | Admitting: Pulmonary Disease

## 2023-02-25 ENCOUNTER — Encounter: Payer: Self-pay | Admitting: Pulmonary Disease

## 2023-02-25 ENCOUNTER — Other Ambulatory Visit: Payer: Self-pay | Admitting: Internal Medicine

## 2023-02-25 VITALS — BP 106/68 | HR 65 | Temp 97.9°F | Ht 63.0 in | Wt 140.0 lb

## 2023-02-25 DIAGNOSIS — J4540 Moderate persistent asthma, uncomplicated: Secondary | ICD-10-CM | POA: Diagnosis not present

## 2023-02-25 DIAGNOSIS — J4530 Mild persistent asthma, uncomplicated: Secondary | ICD-10-CM | POA: Diagnosis not present

## 2023-02-25 DIAGNOSIS — R053 Chronic cough: Secondary | ICD-10-CM | POA: Diagnosis not present

## 2023-02-25 LAB — NITRIC OXIDE: Nitric Oxide: 15

## 2023-02-25 MED ORDER — BUDESONIDE-FORMOTEROL FUMARATE 160-4.5 MCG/ACT IN AERO: 2.0000 | INHALATION_SPRAY | Freq: Two times a day (BID) | RESPIRATORY_TRACT | 12 refills | Status: AC

## 2023-02-25 MED ORDER — FLUTICASONE PROPIONATE 50 MCG/ACT NA SUSP: 1.0000 | Freq: Two times a day (BID) | NASAL | 2 refills | Status: AC

## 2023-02-25 NOTE — Progress Notes (Signed)
Synopsis: Referred in by Sherlene Shams, MD   Subjective:   PATIENT ID: Alison Bradshaw GENDER: female DOB: Jun 11, 1940, MRN: [id number]  Chief Complaint  Patient presents with   Follow-up    Cough with occasional clear phlegm. Shortness of breath and wheezing.     HPI Alison Bradshaw is a 82 year old female patient with a past medical history of of mild persistent asthma, presumed granulomatous lung disease question NTM presenting to the pulmonary clinic as a referral from Dr. Duncan Dull for further on her asthma.   I initially saw her in August 2024, Patient reports that about 3 months ago she started having labored breathing.  Describes it as feeling of working hard to take a deep breath in.  She notices that usually during hot humid weather.  She denies any wheezing.  She denies any chest pain. She does report seasonal nasal congestion and possible post nasal drip.   We started her on Symbicort 80-4.5 which helped with her breathing. However, today she is still complaining of shortness of breath but more so of postnasal drip and nasal congestion.  Family history - Emphysema in the family   Social History - Never smoker, denies alcohol use, denies any illict drug use. Has a dog at home and worked as a Warden/ranger.    ROS All systems were reviewed and are negative except for the above.  Objective:   Vitals:   02/25/23 1036  BP: 106/68  Pulse: 65  Temp: 97.9 F (36.6 C)  TempSrc: Temporal  SpO2: 99%  Weight: 140 lb (63.5 kg)  Height: 5\' 3"  (1.6 m)   99% on RA BMI Readings from Last 3 Encounters:  02/25/23 24.80 kg/m  02/14/23 24.94 kg/m  01/22/23 24.84 kg/m   Wt Readings from Last 3 Encounters:  02/25/23 140 lb (63.5 kg)  02/14/23 140 lb 12.8 oz (63.9 kg)  01/22/23 140 lb 3.2 oz (63.6 kg)    Physical Exam GEN: NAD, Healthy Appearing HEENT: Supple Neck, Reactive Pupils, EOMI, erythematous nasal mucosa. CVS: Normal S1, Normal S2, RRR, No murmurs or ES  appreciated  Lungs: Clear bilateral air entry.  Abdomen: Soft, non tender, non distended, + BS  Extremities: Warm and well perfused, No edema  Skin: No suspicious lesions appreciated  Psych: Normal Affect  Ancillary Information   CBC    Component Value Date/Time   WBC 5.3 11/07/2022 1319   RBC 4.80 11/07/2022 1319   HGB 14.1 11/07/2022 1319   HCT 42.8 11/07/2022 1319   PLT 207.0 11/07/2022 1319   MCV 89.3 11/07/2022 1319   MCH 29.3 04/07/2022 1454   MCHC 32.8 11/07/2022 1319   RDW 14.1 11/07/2022 1319   LYMPHSABS 1.3 11/07/2022 1319   MONOABS 0.8 11/07/2022 1319   EOSABS 0.1 11/07/2022 1319   BASOSABS 0.1 11/07/2022 1319    PFT 2019 -  FVC: 3.05 L (115 %pred), FEV1: 1.81 L (89 %pred), FEV1/FVC: 60%, TLC: 4.64 L (93 %pred), DLCO 82 %pred  CT chest 09/07/2018  There is bandlike scarring of the right middle lobe and lingula with clustered nodularity of the right upper lobe. Previously noted clustered nodules of the posterior right upper lobe in this vicinity are almost completely resolved. 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 No new nodules are noted.  CT chest 2024 1. Clustered bilateral solid pulmonary nodules and ground-glass opacities most pronounced in the posterior right upper lobe, right middle lobe and lingula, slightly worsened when compared with prior  exam with some pulmonary nodules increased in size and others new. 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 2. New irregular solid pulmonary nodule of the right lower lobe measuring 6 mm. Non-contrast chest CT at 3-6 months is recommended. If the nodules are stable at time of repeat CT, then future CT at 18-24 months (from today's scan) is considered optional for low-risk patients, but is recommended for high-risk patients. This recommendation follows the consensus statement: Guidelines for Management of  Incidental Pulmonary Nodules Detected on CT Images: From the Fleischner Society 2017; Radiology 2017; 284:228-243. 3. Mild coronary artery calcifications and aortic Atherosclerosis (ICD10-I70.0).    Latest Ref Rng & Units 12/31/2022    1:41 PM  PFT Results  FVC-Pre L 2.74   FVC-Predicted Pre % 109   FVC-Post L 2.83   FVC-Predicted Post % 113   Pre FEV1/FVC % % 58   Post FEV1/FCV % % 62   FEV1-Pre L 1.59   FEV1-Predicted Pre % 86   FEV1-Post L 1.76   DLCO uncorrected ml/min/mmHg 12.16   DLCO UNC% % 66   DLVA Predicted % 82   TLC L 4.96   TLC % Predicted % 101   RV % Predicted % 101      Assessment & Plan:  Alison Bradshaw is a 82 year old female patient with a past medical history of of mild persistent asthma, presumed granulomatous lung disease question NTM presenting to the pulmonary clinic as a referral from Dr. Duncan Dull for further evaluation of shortness of breath.  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 Good response to bronchodilators. Latter suggestive of persistent asthma with airway remodeling.   []  Increase Budesonide-Formoterol [Symbicort] to 160-4.25mcg 2 puffs twice a day.  []  c/w Albuterol Inh PRN 2 puffs Q6H  []  Start Flonase 1 puff each nares daily for 30 days.  []  Allergen panel and IGE []  Repeat CT chest in February 2025.   Return in about 4 months (around 06/25/2023).  I spent 45 minutes caring for this patient today, including preparing to see the patient, obtaining a medical history , reviewing a separately obtained history, performing a medically appropriate examination and/or evaluation, counseling and educating the patient/family/caregiver, ordering medications, tests, or  procedures, documenting clinical information in the electronic health record, and independently interpreting results (not separately reported/billed) and communicating results to the patient/family/caregiver  Janann Colonel, MD [facility] 02/25/2023 11:59 AM

## 2023-02-27 ENCOUNTER — Ambulatory Visit: Payer: Medicare PPO | Admitting: Pulmonary Disease

## 2023-02-28 LAB — ALLERGEN PANEL (27) + IGE
Alternaria Alternata IgE: <0.1 kU/L
Aspergillus Fumigatus IgE: <0.1 kU/L
Bahia Grass IgE: <0.1 kU/L
Bermuda Grass IgE: <0.1 kU/L
Cat Dander IgE: <0.1 kU/L
Cedar, Mountain IgE: <0.1 kU/L
Cladosporium Herbarum IgE: <0.1 kU/L
Cocklebur IgE: <0.1 kU/L
Cockroach, American IgE: <0.1 kU/L
Common Silver Birch IgE: <0.1 kU/L
D Farinae IgE: <0.1 kU/L
D Pteronyssinus IgE: <0.1 kU/L
Dog Dander IgE: <0.1 kU/L
Elm, American IgE: <0.1 kU/L
Hickory, White IgE: <0.1 kU/L
IgE (Immunoglobulin E), Serum: 102 [IU]/mL (ref 6–495)
Johnson Grass IgE: <0.1 kU/L
Kentucky Bluegrass IgE: <0.1 kU/L
Maple/Box Elder IgE: <0.1 kU/L
Mucor Racemosus IgE: <0.1 kU/L
Oak, White IgE: <0.1 kU/L
Penicillium Chrysogen IgE: <0.1 kU/L
Pigweed, Rough IgE: <0.1 kU/L
Plantain, English IgE: <0.1 kU/L
Ragweed, Short IgE: <0.1 kU/L
Setomelanomma Rostrat: <0.1 kU/L
Timothy Grass IgE: <0.1 kU/L
White Mulberry IgE: <0.1 kU/L

## 2023-03-03 ENCOUNTER — Ambulatory Visit: Payer: Medicare PPO | Admitting: Internal Medicine

## 2023-03-03 ENCOUNTER — Other Ambulatory Visit: Payer: Self-pay | Admitting: Internal Medicine

## 2023-03-03 MED ORDER — ALPRAZOLAM 0.25 MG PO TABS: 0.2500 mg | ORAL_TABLET | Freq: Every evening | ORAL | 2 refills | Status: DC | PRN

## 2023-03-18 ENCOUNTER — Emergency Department
Admission: EM | Admit: 2023-03-18 | Discharge: 2023-03-18 | Disposition: A | Discharge status: Home / Self Care | Payer: Medicare PPO | Attending: Emergency Medicine | Admitting: Emergency Medicine

## 2023-03-18 ENCOUNTER — Other Ambulatory Visit: Payer: Self-pay

## 2023-03-18 ENCOUNTER — Telehealth: Payer: Self-pay | Admitting: Internal Medicine

## 2023-03-18 ENCOUNTER — Emergency Department: Payer: Medicare PPO

## 2023-03-18 DIAGNOSIS — R9431 Abnormal electrocardiogram [ECG] [EKG]: Secondary | ICD-10-CM | POA: Diagnosis not present

## 2023-03-18 DIAGNOSIS — R202 Paresthesia of skin: Secondary | ICD-10-CM | POA: Diagnosis present

## 2023-03-18 DIAGNOSIS — M79606 Pain in leg, unspecified: Secondary | ICD-10-CM | POA: Diagnosis not present

## 2023-03-18 DIAGNOSIS — I739 Peripheral vascular disease, unspecified: Secondary | ICD-10-CM | POA: Diagnosis not present

## 2023-03-18 DIAGNOSIS — J45909 Unspecified asthma, uncomplicated: Secondary | ICD-10-CM | POA: Insufficient documentation

## 2023-03-18 DIAGNOSIS — R252 Cramp and spasm: Secondary | ICD-10-CM

## 2023-03-18 DIAGNOSIS — M79605 Pain in left leg: Secondary | ICD-10-CM | POA: Diagnosis not present

## 2023-03-18 LAB — CBC WITH DIFFERENTIAL/PLATELET
Abs Immature Granulocytes: 0.02 10*3/uL (ref 0.00–0.07)
Basophils Absolute: 0 10*3/uL (ref 0.0–0.1)
Basophils Relative: 1 %
Eosinophils Absolute: 0.1 10*3/uL (ref 0.0–0.5)
Eosinophils Relative: 1 %
HCT: 39.6 % (ref 36.0–46.0)
Hemoglobin: 13.4 g/dL (ref 12.0–15.0)
Immature Granulocytes: 0 %
Lymphocytes Relative: 17 %
Lymphs Abs: 1.1 10*3/uL (ref 0.7–4.0)
MCH: 30.7 pg (ref 26.0–34.0)
MCHC: 33.8 g/dL (ref 30.0–36.0)
MCV: 90.8 fL (ref 80.0–100.0)
Monocytes Absolute: 0.6 10*3/uL (ref 0.1–1.0)
Monocytes Relative: 10 %
Neutro Abs: 4.7 10*3/uL (ref 1.7–7.7)
Neutrophils Relative %: 71 %
Platelets: 184 10*3/uL (ref 150–400)
RBC: 4.36 MIL/uL (ref 3.87–5.11)
RDW: 13.5 % (ref 11.5–15.5)
WBC: 6.6 10*3/uL (ref 4.0–10.5)
nRBC: 0 % (ref 0.0–0.2)

## 2023-03-18 LAB — COMPREHENSIVE METABOLIC PANEL
ALT: 22 U/L (ref 0–44)
AST: 29 U/L (ref 15–41)
Albumin: 4.2 g/dL (ref 3.5–5.0)
Alkaline Phosphatase: 72 U/L (ref 38–126)
Anion gap: 8 (ref 5–15)
BUN: 11 mg/dL (ref 8–23)
CO2: 28 mmol/L (ref 22–32)
Calcium: 9.1 mg/dL (ref 8.9–10.3)
Chloride: 101 mmol/L (ref 98–111)
Creatinine, Ser: 0.74 mg/dL (ref 0.44–1.00)
GFR, Estimated: >60 mL/min (ref >60)
Glucose, Bld: 100 mg/dL — ABNORMAL HIGH (ref 70–99)
Potassium: 3.7 mmol/L (ref 3.5–5.1)
Sodium: 137 mmol/L (ref 135–145)
Total Bilirubin: 1 mg/dL (ref <1.2)
Total Protein: 7.1 g/dL (ref 6.5–8.1)

## 2023-03-18 LAB — CK: Total CK: 101 U/L (ref 38–234)

## 2023-03-18 NOTE — Discharge Instructions (Addendum)
Your workup was reassuring but this could be some peripheral arterial disease and you should follow-up with Dr. Wyn Quaker.  They should call you to make an appointment but if you do not hear from them please call them back return to the ER for worsening symptoms, foot turning blue, worsening pain and we always recheck to make sure that there is a good pulse.

## 2023-03-18 NOTE — Telephone Encounter (Signed)
Spoke with pt to follow up on triage note. Pt stated that she is currently at the ED.

## 2023-03-18 NOTE — ED Provider Notes (Addendum)
[facility] Provider Note    Event Date/Time   First MD Initiated Contact with Patient 03/18/23 1654     (approximate)   History   Numbness   HPI  Alison Bradshaw is a 82 y.o. female with history of asthma who comes in with concerns for tingling to her toes.  Patient reports that she has had off and on coolness of her legs this been a chronic issue but the last few days she has had some intermittent tingling to her toes and some cramping to her calfs.  She states these happen at rest they are not typically exertional but she did do a lot of walking on the beach a few days ago.  She denies any chest pain, shortness of breath or other concerns.  She did see her primary care doctor had some blood work done that showed a left-sided PAD screen 0.75 and a right-sided PAD screen as 0.87 was told to follow-up with her PCP about this.  She is not on any anticoagulation.  She is on a statin.  Physical Exam   Triage Vital Signs: ED Triage Vitals  Encounter Vitals Group     BP 03/18/23 1401 (!) 157/81     Systolic BP Percentile --      Diastolic BP Percentile --      Pulse Rate 03/18/23 1401 64     Resp 03/18/23 1401 17     Temp 03/18/23 1401 98.2 F (36.8 C)     Temp Source 03/18/23 1401 Oral     SpO2 03/18/23 1401 100 %     Weight 03/18/23 1402 139 lb 15.9 oz (63.5 kg)     Height 03/18/23 1402 5\' 3"  (1.6 m)     Head Circumference --      Peak Flow --      Pain Score 03/18/23 1402 0     Pain Loc --      Pain Education --      Exclude from Growth Chart --     Most recent vital signs: Vitals:   03/18/23 1401  BP: (!) 157/81  Pulse: 64  Resp: 17  Temp: 98.2 F (36.8 C)  SpO2: 100%     General: Awake, no distress.  CV:  Good peripheral perfusion.  Resp:  Normal effort.  Abd:  No distention.  Other:  Has palpable DP and PT pulses bilaterally.  I did not use a Doppler to confirm that these pulses were strong.  She is got no obvious discoloration to  the leg she reports a little bit of sensation changes over the 3 middle toes on the right side but is only if she squeezes or touches them.  Does not go up the leg.   ED Results / Procedures / Treatments   Labs (all labs ordered are listed, but only abnormal results are displayed) Labs Reviewed  COMPREHENSIVE METABOLIC PANEL - Abnormal; Notable for the following components:      Result Value   Glucose, Bld 100 (*)    All other components within normal limits  CBC WITH DIFFERENTIAL/PLATELET  CK     EKG  My interpretation of EKG:  Normal sinus rate of 62 without any ST elevation or T wave inversions, normal intervals  RADIOLOGY I have reviewed the ultrasound personally and interpreted and no DVT     PROCEDURES:  Critical Care performed: No  Procedures   MEDICATIONS ORDERED IN ED: Medications - No data to display  IMPRESSION / MDM / ASSESSMENT AND PLAN / ED COURSE  I reviewed the triage vital signs and the nursing notes.   Patient's presentation is most consistent with acute presentation with potential threat to life or bodily function.   She comes in with leg cramping, tingling in her toes.  Good distal pulses I doubt that this is an acute limb ischemia. I suspect some claudications but no acute limb ischemia. Discussed with Dr. Wyn Quaker vascular given the known peripheral artery disease and they will get her in the office this week.  Does not need CT angio per Dr Wyn Quaker given good distal pulses at this time.  EKG was done and she is got no signs of A-fib that would put her at high risk for acute ischemic clot.  I will get ultrasound to make sure no DVT.  Basic labs are ordered evaluate for any electrolyte abnormalities, rhabdo given she is on a statin.  CK normal CBC reassuring CMP reassuring  Ultrasounds are negative.  Reevaluated patient still has warm well-perfused feet.  We discussed return precautions in regards to this but she would need to come in more  emergently.  Considered admission but Given reassuring workup and examination patient can follow-up outpatient with Dr. Wyn Quaker this week I can return if she develops a change in her symptoms.  We discussed taking a baby aspirin daily but she is opted to decline this for right now given she has a history of ulcers.    FINAL CLINICAL IMPRESSION(S) / ED DIAGNOSES   Final diagnoses:  Leg cramps  Peripheral artery disease (HCC)     Rx / DC Orders   ED Discharge Orders     None        Note:  This document was prepared using Dragon voice recognition software and may include unintentional dictation errors.   Concha Se, MD 03/18/23 7829    Concha Se, MD 03/18/23 [phone]

## 2023-03-18 NOTE — Telephone Encounter (Signed)
Access nurse called back stating the patient refused to go to the ED. She stated the patient informed her she wanted to see the provider first.

## 2023-03-18 NOTE — ED Provider Triage Note (Signed)
Emergency Medicine Provider Triage Evaluation Note  Norberta Cromartie , a 82 y.o. female  was evaluated in triage.  Pt complains of right foot numbness for 2 days. Describes it as feeling cold, tingly and numb.   Review of Systems  Positive: Right foot numbness Negative: pain  Physical Exam  There were no vitals taken for this visit. Gen:   Awake, no distress   Resp:  Normal effort  MSK:   Moves extremities without difficulty  Other:  No skin discoloration of the toes or foot, dorsalis pedis pulse is 2+ and regular, toes are cool to touch capillary refill less than 2 seconds  Medical Decision Making  Medically screening exam initiated at 1:59 PM.  Appropriate orders placed.  [name] was informed that the remainder of the evaluation will be completed by another provider, this initial triage assessment does not replace that evaluation, and the importance of remaining in the ED until their evaluation is complete.     Cameron Ali, PA-C 03/18/23 1405

## 2023-03-18 NOTE — Telephone Encounter (Signed)
  Symptoms: tingling and numbness to toes. States she has been feeling like this for a week. Patient was transferred to access a nurse    Attributing factors (medication changes, positional changes, etc. )      Duration :     Pain Scale?  On 1-10 how woiuld you rate your pain? What makes it better or worse?      Blood pressure            Pulse             Temp

## 2023-03-18 NOTE — Telephone Encounter (Signed)
LMTCB

## 2023-03-18 NOTE — ED Triage Notes (Signed)
Pt here with right foot numbness x2 days. Pt states it will get numb and cold at times. Pt denies pain or any issues while walking. Pt ambulatory to triage.

## 2023-03-19 ENCOUNTER — Ambulatory Visit: Payer: Medicare PPO | Admitting: Internal Medicine

## 2023-03-19 ENCOUNTER — Telehealth: Payer: Self-pay

## 2023-03-19 NOTE — Telephone Encounter (Signed)
noted 

## 2023-03-19 NOTE — Telephone Encounter (Signed)
Error

## 2023-03-19 NOTE — Telephone Encounter (Signed)
Spoke with pt to schedule her an appt to discuss before she goes out of town. I have pt schedule for Monday 12/16 at 5 pm in person. Pt is aware that she should arrive to the office for check in by 4:45. If this does not work please let me know and I will get her rescheduled.

## 2023-03-19 NOTE — Telephone Encounter (Signed)
Patient states she did go to the ED yesterday.  Patient states she had wanted to keep her appointment today at 1pm with Dr. Duncan Dull, but it had been cancelled.  Patient states she would like to follow-up with Dr. Darrick Huntsman regarding her ED visit and some other issues she had wanted to discuss before she gets on a plane to go to [facility] on 04/03/2023.  Patient states she is willing to do a virtual if she can't get in for an office visit.  I let patient know that Dr. Darrick Huntsman does not have an available appointment between now and 04/03/2023, at this time.  I scheduled an appointment for patient to see Dr. Darrick Huntsman on 04/25/2023.  Patient states she does have an appointment with Dr. Wyn Quaker on 03/21/2023.  I let patient know that I will send a message to Dr. Darrick Huntsman to let her know the situation.

## 2023-03-21 ENCOUNTER — Ambulatory Visit (INDEPENDENT_AMBULATORY_CARE_PROVIDER_SITE_OTHER): Payer: Medicare PPO | Admitting: Vascular Surgery

## 2023-03-21 VITALS — BP 112/53 | HR 66 | Resp 16 | Ht 63.0 in | Wt 140.0 lb

## 2023-03-21 DIAGNOSIS — I739 Peripheral vascular disease, unspecified: Secondary | ICD-10-CM | POA: Diagnosis not present

## 2023-03-21 DIAGNOSIS — R7303 Prediabetes: Secondary | ICD-10-CM | POA: Diagnosis not present

## 2023-03-21 DIAGNOSIS — E782 Mixed hyperlipidemia: Secondary | ICD-10-CM | POA: Diagnosis not present

## 2023-03-21 NOTE — Assessment & Plan Note (Signed)
To evaluate her perfusion, her primary care physician had ABIs done which showed a right ABI of 0.87 and a left ABI of 0.75.   Her current symptoms sound more neuropathic in nature than vascular.  It is also interesting that the lower ABI has left symptoms.  That being said, certainly malperfusion can be a contributing factor to neurogenic symptoms as well.  I am going to obtain formal arterial duplex in the near future at her convenience.  She has an upcoming trip to the [facility] and I recommend she wear a low-grade support stocking while she is traveling and try to walk and be as active as possible.  She will follow-up with her noninvasive studies in the near future to discuss the results and determine further treatment options.

## 2023-03-21 NOTE — Progress Notes (Signed)
Patient ID: Alison Bradshaw, female   DOB: 08/04/1940, 82 y.o.   MRN: [id number]  Chief Complaint  Patient presents with   Follow-up    Consult for PAD    HPI Alison Bradshaw is a 82 y.o. female.  I am asked to see the patient by Dr. Newton Pigg for evaluation of PAD with pain and numbness in her right foot.  This has started in the last few months.  She has always had what she describes as cold feet and hands.  This was mostly in the hands until the last few years when she noticed it more in the feet as well.  This sensation currently is more painful and uncomfortable with numbness and tingling particularly on the forefoot of the right foot.  No ulceration or infection.  She has some of those symptoms on the left but not as severe.  She really started noticing this after she was walking on the beach and had her feet and very cold water a couple of months ago.  She had been walking barefoot.  She had not had any claudication type symptoms with this walking.  To evaluate her perfusion, her primary care physician had ABIs done which showed a right ABI of 0.87 and a left ABI of 0.75.     Past Medical History:  Diagnosis Date   Anxiety    Asthma    Atypical chest pain    Barrett's esophagus    Chronic gastritis    Diverticulosis    Duodenal ulcer    Gastritis    GERD (gastroesophageal reflux disease)    Hiatal hernia    History of echocardiogram    a. 08/2020 Echo: EF 60-65%, no rwma, Nl RV fxn, mild MR.   Hyperlipidemia    Non-obstructive CAD (coronary artery disease)    a. 10/2017 Stress Echo: Nl EF, no ischemia. Mild MR; b. 12/2017 Cardiac CTA: LM nl, LAD 50p, 30d, D1/2 nl, RI nl, LCX nl, OM1/2 nl, RCA nl, RPDA/RPL nl. Ca2+ score = 39 (46th percentile)-->statin dose escalated; c. 10/2021 MV: No ischemia/infarct. Nl LV fxn.   Osteopenia    Peptic ulcer, site unspecified, unspecified as acute or chronic, without hemorrhage or perforation 11/19/2012   Formatting of this note might be  different from the original.  duodenum 1968  Formatting of this note might be different from the original.  Overview:   duodenum 1968   PSVT (paroxysmal supraventricular tachycardia) (HCC)    a. 10/2017 Holter: PSVT, max 5 beats->did not tolerate beta blocker; b. 09/2020 Zio: 38 runs of SVT. Longest 18.1 secs (106), fastest 203 bpm (6 beats). Occas PVCs (1.1%) - some assoc w/ triggered events; C. 11/2022 Zio: Predominantly sinus rhythm @ 69 (48-182).  34 runs of PSVT, longest 12 beats.  3.3% PVC burden.  No sustained arrhythmias or A-fib.   Pulmonary nodules    a. 12/2017 Cardiac CTA: RLL 9mm nodule; b. 04/2018 RLL 9mm nodule resolved; c. 09/2018 CT Chest: Previously noted posterior RUL nodules almost completely resolved. Bandlike scarring of RML and lingula-->atyp infxn; d. 11/2022 CT Chest: Clustered bilateral solid pulmonary nodules and groundglass opacities, most pronounced in posterior RUL, RML, and lingula - ? chronic atyp infxn. New 6mm RLL nodule.   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    Past Surgical History:  Procedure Laterality Date   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   BREAST BIOPSY Left 2008   neg   BREAST EXCISIONAL BIOPSY Left 2004   neg   CATARACT EXTRACTION W/ INTRAOCULAR LENS  IMPLANT, BILATERAL Bilateral    COLONOSCOPY WITH PROPOFOL N/A 01/22/2017   Procedure: COLONOSCOPY WITH PROPOFOL;  Surgeon: Toledo, Boykin Nearing, MD;  Location: [facility] ENDOSCOPY;  Service: Gastroenterology;  Laterality: N/A;   COLONOSCOPY WITH PROPOFOL N/A 03/26/2022   Procedure: COLONOSCOPY WITH PROPOFOL;  Surgeon: Regis Bill, MD;  Location: [facility] ENDOSCOPY;  Service: Endoscopy;  Laterality: N/A;   ESOPHAGOGASTRODUODENOSCOPY (EGD) WITH PROPOFOL N/A  01/20/2015   Procedure: ESOPHAGOGASTRODUODENOSCOPY (EGD) WITH PROPOFOL;  Surgeon: Christena Deem, MD;  Location: [facility] ENDOSCOPY;  Service: Endoscopy;  Laterality: N/A;   ESOPHAGOGASTRODUODENOSCOPY (EGD) WITH PROPOFOL N/A 01/22/2017   Procedure: ESOPHAGOGASTRODUODENOSCOPY (EGD) WITH PROPOFOL;  Surgeon: Toledo, Boykin Nearing, MD;  Location: [facility] ENDOSCOPY;  Service: Gastroenterology;  Laterality: N/A;   ESOPHAGOGASTRODUODENOSCOPY (EGD) WITH PROPOFOL N/A 04/14/2018   Procedure: ESOPHAGOGASTRODUODENOSCOPY (EGD) WITH PROPOFOL;  Surgeon: Christena Deem, MD;  Location: [facility] ENDOSCOPY;  Service: Endoscopy;  Laterality: N/A;   ESOPHAGOGASTRODUODENOSCOPY (EGD) WITH PROPOFOL N/A 10/16/2021   Procedure: ESOPHAGOGASTRODUODENOSCOPY (EGD) WITH PROPOFOL;  Surgeon: Regis Bill, MD;  Location: [facility] ENDOSCOPY;  Service: Endoscopy;  Laterality: N/A;   EYE SURGERY Right    laser scraping   OPEN REDUCTION INTERNAL FIXATION (ORIF) DISTAL RADIAL FRACTURE Right 02/09/2018   Procedure: OPEN REDUCTION INTERNAL FIXATION (ORIF) DISTAL RADIAL FRACTURE;  Surgeon: Kennedy Bucker, MD;  Location: [facility] ORS;  Service: Orthopedics;  Laterality: Right;   TONSILLECTOMY       Family History  Problem Relation Age of Onset   Hyperlipidemia Mother    Alzheimer's disease Mother    Emphysema Father    AAA (abdominal aortic aneurysm) Father    Kidney Stones Father    Kidney cancer Brother    Pancreatic cancer Paternal Uncle    Diabetes Paternal Uncle    Stomach cancer Neg Hx    Colon cancer Neg Hx    Esophageal cancer Neg Hx       Social History   Tobacco Use   Smoking status: Former    Types: Cigarettes    Passive exposure: Past   Smokeless tobacco: Never   Tobacco comments:    Quit at age 3, smoked 1 pack a month   Vaping Use   Vaping status: Never Used  Substance Use Topics   Alcohol use: Yes    Alcohol/week: 2.0 standard drinks of alcohol    Types: 2 Glasses of wine per week    Comment: weekly,none  last 24 hrs   Drug use: No     Allergies  Allergen Reactions   Percocet [Oxycodone-Acetaminophen] Nausea Only   Ceftin [Cefuroxime Axetil] Hives and Rash   Timentin [Ticarcillin-Pot Clavulanate] Hives and Rash    Current Outpatient Medications  Medication Sig Dispense Refill   acetaminophen (TYLENOL) 500 MG tablet Take 1,000 mg by mouth every 6 (six) hours as needed.     albuterol (VENTOLIN HFA) 108 (90 Base) MCG/ACT inhaler Inhale 2 puffs into the lungs every 6 (six) hours as needed for shortness of breath. 3 each 0   ALPRAZolam (XANAX) 0.25 MG tablet Take 1 tablet (0.25 mg total) by mouth at bedtime as needed for anxiety. 30 tablet 2   budesonide-formoterol (SYMBICORT) 160-4.5 MCG/ACT inhaler Inhale 2  puffs into the lungs in the morning and at bedtime. 1 each 12   budesonide-formoterol (SYMBICORT) 80-4.5 MCG/ACT inhaler Inhale 2 puffs into the lungs every 6 (six) hours. 1 each 12   fluticasone (FLONASE) 50 MCG/ACT nasal spray Place 1 spray into both nostrils in the morning and at bedtime. 100 mL 2   ipratropium (ATROVENT) 0.06 % nasal spray Place 2 sprays into both nostrils 4 (four) times daily. (Patient taking differently: Place 2 sprays into both nostrils as needed.) 15 mL 12   pantoprazole (PROTONIX) 40 MG tablet Take 1 tablet (40 mg total) by mouth daily. 90 tablet 3   rosuvastatin (CRESTOR) 10 MG tablet TAKE 1 TABLET(10 MG) BY MOUTH DAILY 90 tablet 1   sucralfate (CARAFATE) 1 G tablet Take 1 g by mouth as needed. Pt only takes when she's really needs it which isn't very often     azelastine (ASTELIN) 0.1 % nasal spray Place 1-2 sprays into both nostrils 2 (two) times daily. (Patient not taking: Reported on 02/14/2023)     benzonatate (TESSALON) 100 MG capsule Take 1 capsule (100 mg total) by mouth 3 (three) times daily as needed for cough. (Patient not taking: Reported on 02/25/2023) 21 capsule 0   nystatin (MYCOSTATIN) 100000 UNIT/ML suspension Take 5 mLs (500,000 Units total) by  mouth 4 (four) times daily. (Patient not taking: Reported on 02/25/2023) 60 mL 0   No current facility-administered medications for this visit.      REVIEW OF SYSTEMS (Negative unless checked)  Constitutional: [] Weight loss  [] Fever  [] Chills Cardiac: [] Chest pain   [] Chest pressure   [] Palpitations   [] Shortness of breath when laying flat   [] Shortness of breath at rest   [] Shortness of breath with exertion. Vascular:  [] Pain in legs with walking   [] Pain in legs at rest   [] Pain in legs when laying flat   [] Claudication   [] Pain in feet when walking  [] Pain in feet at rest  [] Pain in feet when laying flat   [] History of DVT   [] Phlebitis   [] Swelling in legs   [] Varicose veins   [] Non-healing ulcers Pulmonary:   [] Uses home oxygen   [] Productive cough   [] Hemoptysis   [] Wheeze  [] COPD   [x] Asthma Neurologic:  [] Dizziness  [] Blackouts   [] Seizures   [] History of stroke   [] History of TIA  [] Aphasia   [] Temporary blindness   [] Dysphagia   [] Weakness or numbness in arms   [x] Weakness or numbness in legs Musculoskeletal:  [] Arthritis   [] Joint swelling   [] Joint pain   [] Low back pain Hematologic:  [] Easy bruising  [] Easy bleeding   [] Hypercoagulable state   [] Anemic  [] Hepatitis Gastrointestinal:  [] Blood in stool   [] Vomiting blood  [x] Gastroesophageal reflux/heartburn   [] Abdominal pain Genitourinary:  [] Chronic kidney disease   [] Difficult urination  [] Frequent urination  [] Burning with urination   [] Hematuria Skin:  [] Rashes   [] Ulcers   [] Wounds Psychological:  [x] History of anxiety   []  History of major depression.    Physical Exam BP (!) 112/53   Pulse 66   Resp 16   Ht 5\' 3"  (1.6 m)   Wt 140 lb (63.5 kg)   BMI 24.80 kg/m  Gen:  WD/WN, NAD Head: [place]/AT, No temporalis wasting.  Ear/Nose/Throat: Hearing grossly intact, nares w/o erythema or drainage, oropharynx w/o Erythema/Exudate Eyes: Conjunctiva clear, sclera non-icteric  Neck: trachea midline.  No JVD.  Pulmonary:  Good  air movement, respirations not labored, no use of accessory muscles  Cardiac: RRR, no JVD Vascular:  Vessel Right Left  Radial Palpable Palpable                          DP 2+ 1+  PT 1+ 1+   Gastrointestinal:. No masses, surgical incisions, or scars. Musculoskeletal: M/S 5/5 throughout.  Extremities without ischemic changes.  No deformity or atrophy.  Feet are slightly cool with slightly sluggish capillary refill.  No significant lower extremity edema. Neurologic: Sensation grossly intact in extremities.  Symmetrical.  Speech is fluent. Motor exam as listed above. Psychiatric: Judgment intact, Mood & affect appropriate for pt's clinical situation. Dermatologic: No rashes or ulcers noted.  No cellulitis or open wounds.    Radiology US Venous Img Lower Bilateral Result Date: 03/18/2023 CLINICAL DATA:  Leg pain for 2 days, initial encounter EXAM: BILATERAL LOWER EXTREMITY VENOUS DOPPLER ULTRASOUND TECHNIQUE: Gray-scale sonography with graded compression, as well as color Doppler and duplex ultrasound were performed to evaluate the lower extremity deep venous systems from the level of the common femoral vein and including the common femoral, femoral, profunda femoral, popliteal and calf veins including the posterior tibial, peroneal and gastrocnemius veins when visible. The superficial great saphenous vein was also interrogated. Spectral Doppler was utilized to evaluate flow at rest and with distal augmentation maneuvers in the common femoral, femoral and popliteal veins. COMPARISON:  None Available. FINDINGS: RIGHT LOWER EXTREMITY Common Femoral Vein: No evidence of thrombus. Normal compressibility, respiratory phasicity and response to augmentation. Saphenofemoral Junction: No evidence of thrombus. Normal compressibility and flow on color Doppler imaging. Profunda Femoral Vein: No evidence of thrombus. Normal compressibility and flow on color Doppler imaging. Femoral Vein: No evidence of  thrombus. Normal compressibility, respiratory phasicity and response to augmentation. Popliteal Vein: No evidence of thrombus. Normal compressibility, respiratory phasicity and response to augmentation. Calf Veins: No evidence of thrombus. Normal compressibility and flow on color Doppler imaging. Superficial Great Saphenous Vein: No evidence of thrombus. Normal compressibility and flow on color Doppler imaging. Venous Reflux:  None. Other Findings:  None. LEFT LOWER EXTREMITY Common Femoral Vein: No evidence of thrombus. Normal compressibility, respiratory phasicity and response to augmentation. Saphenofemoral Junction: No evidence of thrombus. Normal compressibility and flow on color Doppler imaging. Profunda Femoral Vein: No evidence of thrombus. Normal compressibility and flow on color Doppler imaging. Femoral Vein: No evidence of thrombus. Normal compressibility, respiratory phasicity and response to augmentation. Popliteal Vein: No evidence of thrombus. Normal compressibility, respiratory phasicity and response to augmentation. Calf Veins: No evidence of thrombus. Normal compressibility and flow on color Doppler imaging. Superficial Great Saphenous Vein: No evidence of thrombus. Normal compressibility and flow on color Doppler imaging. Venous Reflux:  None. Other Findings:  None. IMPRESSION: No evidence of deep venous thrombosis. Electronically Signed   By: Alcide Clever M.D.   On: 03/18/2023 22:00    Labs Recent Results (from the past 2160 hours)  Pulmonary Function Test [facility] Only     Status: None   Collection Time: 12/31/22  1:41 PM  Result Value Ref Range   FVC-Pre 2.74 L   FVC-%Pred-Pre 109 %   FVC-Post 2.83 L   FVC-%Pred-Post 113 %   FVC-%Change-Post 3 %   FEV1-Pre 1.59 L   FEV1-%Pred-Pre 86 %   FEV1-Post 1.76 L   FEV1-%Pred-Post 95 %   FEV1-%Change-Post 10 %   FEV6-Pre 2.69 L   FEV6-%Pred-Pre 114 %   FEV6-Post 2.76 L   FEV6-%Pred-Post 117 %   FEV6-%Change-Post 2 %  Pre FEV1/FVC  ratio 58 %   FEV1FVC-%Pred-Pre 78 %   Post FEV1/FVC ratio 62 %   FEV1FVC-%Change-Post 6 %   Pre FEV6/FVC Ratio 98 %   FEV6FVC-%Pred-Pre 104 %   Post FEV6/FVC ratio 99 %   FEV6FVC-%Pred-Post 104 %   FEV6FVC-%Change-Post 0 %   FEF 25-75 Pre 0.67 L/sec   FEF2575-%Pred-Pre 49 %   FEF 25-75 Post 0.90 L/sec   FEF2575-%Pred-Post 66 %   FEF2575-%Change-Post 33 %   RV 2.38 L   RV % pred 101 %   TLC 4.96 L   TLC % pred 101 %   DLCO unc 12.16 ml/min/mmHg   DLCO unc % pred 66 %   DL/VA 5.64 ml/min/mmHg/L   DL/VA % pred 82 %  POC [id number]     Status: None   Collection Time: 02/14/23  1:02 PM  Result Value Ref Range   SARS Coronavirus 2 Ag Negative Negative  Nitric oxide     Status: None   Collection Time: 02/25/23 10:53 AM  Result Value Ref Range   Nitric Oxide 15   Allergen Panel (27) + IGE     Status: None   Collection Time: 02/25/23 11:26 AM  Result Value Ref Range   Class Description Allergens Comment     Comment: (NOTE)    Levels of Specific IgE       Class  Description of Class    ---------------------------  -----  --------------------                   < 0.10         0         Negative           0.10 -    0.31         0/I       Equivocal/Low           0.32 -    0.55         I         Low           0.56 -    1.40         II        Moderate           1.41 -    3.90         III       High           3.91 -   19.00         IV        Very High          19.01 -  100.00         V         Very High                  >100.00         VI        Very High    IgE (Immunoglobulin E), Serum 102 6 - 495 IU/mL   D Pteronyssinus IgE <0.10 Class 0 kU/L   D Farinae IgE <0.10 Class 0 kU/L   Cat Dander IgE <0.10 Class 0 kU/L   Dog Dander IgE <0.10 Class 0 kU/L   [place] Grass IgE <0.10 Class 0 kU/L   Timothy Grass IgE <0.10 Class 0 kU/L   [place] Bluegrass IgE <0.10 Class 0 kU/L   [place] Grass IgE <  0.10 Class 0 kU/L   B[place]ia Grass IgE <0.10 Class 0 kU/L   Cockroach, American IgE <0.10  Class 0 kU/L    Comment: (NOTE) This test was developed and its performance characteristics determined by [facility].  It has not been cleared or approved by the U.S. Food and Drug Administration. The FDA has determined that such clearance or approval is not necessary. This test is used for clinical purposes.  It should not be regarded as investigational or for research.    Penicillium Chrysogen IgE <0.10 Class 0 kU/L   Cladosporium Herbarum IgE <0.10 Class 0 kU/L   Aspergillus Fumigatus IgE <0.10 Class 0 kU/L   Mucor Racemosus IgE <0.10 Class 0 kU/L   Alternaria Alternata IgE <0.10 Class 0 kU/L   Setomelanomma Rostrat <0.10 Class 0 kU/L   Oak, White IgE <0.10 Class 0 kU/L   Elm, American IgE <0.10 Class 0 kU/L   Maple/Box Elder IgE <0.10 Class 0 kU/L   Common Silver Charletta Cousin IgE <0.10 Class 0 kU/L   Hickory, White IgE <0.10 Class 0 kU/L    Comment: (NOTE) This test was developed and its performance characteristics determined by [facility].  It has not been cleared or approved by the U.S. Food and Drug Administration. The FDA has determined that such clearance or approval is not necessary. This test is used for clinical purposes.  It should not be regarded as investigational or for research.    White Mulberry IgE <0.10 Class 0 kU/L   Cedar, [place] IgE <0.10 Class 0 kU/L   Ragweed, Short IgE <0.10 Class 0 kU/L   Plantain, English IgE <0.10 Class 0 kU/L   Cocklebur IgE <0.10 Class 0 kU/L   Pigweed, Rough IgE <0.10 Class 0 kU/L    Comment: (NOTE) Performed At: [facility] [address] [place], [place] [id number] Jolene Schimke MD WU:[id number]   CBC with Differential     Status: None   Collection Time: 03/18/23  6:00 PM  Result Value Ref Range   WBC 6.6 4.0 - 10.5 K/uL   RBC 4.36 3.87 - 5.11 MIL/uL   Hemoglobin 13.4 12.0 - 15.0 g/dL   HCT 95.6 21.3 - 08.6 %   MCV 90.8 80.0 - 100.0 fL   MCH 30.7 26.0 - 34.0 pg   MCHC 33.8 30.0 - 36.0 g/dL   RDW 57.8 46.9 - 62.9 %    Platelets 184 150 - 400 K/uL   nRBC 0.0 0.0 - 0.2 %   Neutrophils Relative % 71 %   Neutro Abs 4.7 1.7 - 7.7 K/uL   Lymphocytes Relative 17 %   Lymphs Abs 1.1 0.7 - 4.0 K/uL   Monocytes Relative 10 %   Monocytes Absolute 0.6 0.1 - 1.0 K/uL   Eosinophils Relative 1 %   Eosinophils Absolute 0.1 0.0 - 0.5 K/uL   Basophils Relative 1 %   Basophils Absolute 0.0 0.0 - 0.1 K/uL   Immature Granulocytes 0 %   Abs Immature Granulocytes 0.02 0.00 - 0.07 K/uL    Comment: Performed at [facility], [address] [place], [place] [address]  Comprehensive metabolic panel     Status: Abnormal   Collection Time: 03/18/23  6:00 PM  Result Value Ref Range   Sodium 137 135 - 145 mmol/L   Potassium 3.7 3.5 - 5.1 mmol/L   Chloride 101 98 - 111 mmol/L   CO2 28 22 - 32 mmol/L   Glucose, Bld 100 (H) 70 - 99 mg/dL  Comment: Glucose reference range applies only to samples taken after fasting for at least 8 hours.   BUN 11 8 - 23 mg/dL   Creatinine, Ser 1.61 0.44 - 1.00 mg/dL   Calcium 9.1 8.9 - 09.6 mg/dL   Total Protein 7.1 6.5 - 8.1 g/dL   Albumin 4.2 3.5 - 5.0 g/dL   AST 29 15 - 41 U/L   ALT 22 0 - 44 U/L   Alkaline Phosphatase 72 38 - 126 U/L   Total Bilirubin 1.0 <1.2 mg/dL   GFR, Estimated >04 >54 mL/min    Comment: (NOTE) Calculated using the CKD-EPI Creatinine Equation (2021)    Anion gap 8 5 - 15    Comment: Performed at [facility], [address] [place], [place] [address]  CK     Status: None   Collection Time: 03/18/23  6:00 PM  Result Value Ref Range   Total CK 101 38 - 234 U/L    Comment: Performed at [facility], [address] [place], [place] [address]    Assessment/Plan:  PAD (peripheral artery disease) (HCC) To evaluate her perfusion, her primary care physician had ABIs done which showed a right ABI of 0.87 and a left ABI of 0.75.   Her current symptoms sound more neuropathic in nature than vascular.  It is also interesting that the  lower ABI has left symptoms.  That being said, certainly malperfusion can be a contributing factor to neurogenic symptoms as well.  I am going to obtain formal arterial duplex in the near future at her convenience.  She has an upcoming trip to the [facility] and I recommend she wear a low-grade support stocking while she is traveling and try to walk and be as active as possible.  She will follow-up with her noninvasive studies in the near future to discuss the results and determine further treatment options.  Mixed hyperlipidemia lipid control important in reducing the progression of atherosclerotic disease. Continue statin therapy   Prediabetes blood glucose control important in reducing the progression of atherosclerotic disease. Also, involved in wound healing.      Festus Barren 03/21/2023, 2:39 PM   This note was created with Dragon medical transcription system.  Any errors from dictation are unintentional.

## 2023-03-21 NOTE — Assessment & Plan Note (Signed)
lipid control important in reducing the progression of atherosclerotic disease. Continue statin therapy  

## 2023-03-21 NOTE — Assessment & Plan Note (Signed)
blood glucose control important in reducing the progression of atherosclerotic disease. Also, involved in wound healing.

## 2023-03-24 ENCOUNTER — Ambulatory Visit: Payer: Medicare PPO | Admitting: Internal Medicine

## 2023-03-26 DIAGNOSIS — R051 Acute cough: Secondary | ICD-10-CM | POA: Diagnosis not present

## 2023-04-17 ENCOUNTER — Encounter: Payer: Self-pay | Admitting: Family

## 2023-04-17 ENCOUNTER — Ambulatory Visit: Payer: Medicare PPO | Admitting: Family

## 2023-04-17 VITALS — BP 122/68 | HR 64 | Temp 97.2°F | Ht 63.0 in | Wt 143.4 lb

## 2023-04-17 DIAGNOSIS — J40 Bronchitis, not specified as acute or chronic: Secondary | ICD-10-CM

## 2023-04-17 DIAGNOSIS — J4520 Mild intermittent asthma, uncomplicated: Secondary | ICD-10-CM

## 2023-04-17 DIAGNOSIS — J45998 Other asthma: Secondary | ICD-10-CM | POA: Diagnosis not present

## 2023-04-17 DIAGNOSIS — J45909 Unspecified asthma, uncomplicated: Secondary | ICD-10-CM | POA: Diagnosis not present

## 2023-04-17 MED ORDER — PREDNISONE 10 MG PO TABS: ORAL_TABLET | ORAL | 0 refills | Status: DC

## 2023-04-17 MED ORDER — DOXYCYCLINE HYCLATE 100 MG PO TABS: 100.0000 mg | ORAL_TABLET | Freq: Two times a day (BID) | ORAL | 0 refills | Status: AC

## 2023-04-17 MED ORDER — LEVALBUTEROL HCL 0.63 MG/3ML IN NEBU: INHALATION_SOLUTION | RESPIRATORY_TRACT | 12 refills | Status: DC

## 2023-04-17 NOTE — Progress Notes (Signed)
 Assessment & Plan:  Mild intermittent asthma, unspecified whether complicated -     Doxycycline  Hyclate; Take 1 tablet (100 mg total) by mouth 2 (two) times daily for 7 days.  Dispense: 14 tablet; Refill: 0 -     predniSONE ; Take 40 mg by mouth on day 1, then taper 10 mg daily until gone  Dispense: 10 tablet; Refill: 0 -     For home use only DME Nebulizer machine -     Levalbuterol  HCl; 1.25 mg every 6-8 hours as needed  Dispense: 3 mL; Refill: 12  Bronchitis Assessment & Plan: Afebrile, nontoxic in appearance.  No acute respiratory distress.  Concern for bacterial URI superimposed with history of asthma.  Improvement with DuoNeb.  Provided nebulizer machine and prescribed Xopenex .  Patient has a history of SVT ,asymptomatic today, therefore opted to use Xopenex .  Advised to start mucinex  DM , doxycycline  .  Provided prescription for prednisone  taper however advised her if she felt chest tightness, wheezing resolved with nebulizer, she may monitor and start prednisone  if needed.         Return precautions given.   Risks, benefits, and alternatives of the medications and treatment plan prescribed today were discussed, and patient expressed understanding.   Education regarding symptom management and diagnosis given to patient on AVS either electronically or printed.  I have spent 30 minutes with a patient including precharting, exam, reviewing medical records including pulmonology notes, and discussion plan of care.     No follow-ups on file.  Rollene Northern, FNP  Subjective:    Patient ID: Alison Bradshaw, female    DOB: [birth year], 83 y.o.   MRN: [id number]  CC: Alison Bradshaw is a 83 y.o. female who presents today for an acute visit.    HPI: Complains of cough , hoarseness x 3 weeks, worsening.  She is concerned it has 'gone into her chest.'  Endorses sneezing, chest tightness, occasionally wheezing, sinus pressure bilaterally  Using albuterol  BID with little  relief.   She traveled to [place]  03/26/23  and developed hoarseness , she stopped symbicort , due to concern for thrush at that time.   Denies SOB,  F,  leg swelling; she has not labored in speech  She was seen at 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  Increase Symbicort  to 160-4.5 mcg 2 puffs twice daily, continue albuterol  as started on Flonase   Chest x-ray 02/14/2023 without active cardiopulmonary disease  Treated with Doxycycline  for pansinusitis 02/14/2023.  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  New irregular solid pulmonary nodule right lower lobe.  No h/o ckd  ZIO monitor without atrial fibrillation or runs of SVT 11/2022   Allergies: Percocet [oxycodone -acetaminophen ], Ceftin [cefuroxime axetil], and Timentin [ticarcillin-pot clavulanate] Current Outpatient Medications on File Prior to Visit  Medication Sig Dispense Refill   acetaminophen  (TYLENOL ) 500 MG tablet Take 1,000 mg by mouth every 6 (six) hours as needed.     albuterol  (VENTOLIN  HFA) 108 (90 Base) MCG/ACT inhaler Inhale 2 puffs into the lungs every 6 (six) hours as needed for shortness of breath. 3 each 0  ALPRAZolam  (XANAX ) 0.25 MG tablet Take 1 tablet (0.25 mg total) by mouth at bedtime as needed for anxiety. 30 tablet 2   budesonide -formoterol  (SYMBICORT ) 160-4.5 MCG/ACT inhaler Inhale 2 puffs into the lungs in the morning and at bedtime. 1 each 12   fluticasone  (FLONASE ) 50 MCG/ACT nasal spray Place 1 spray into both nostrils in the morning and at  bedtime. 100 mL 2   ipratropium (ATROVENT ) 0.06 % nasal spray Place 2 sprays into both nostrils 4 (four) times daily. (Patient taking differently: Place 2 sprays into both nostrils as needed.) 15 mL 12   pantoprazole  (PROTONIX ) 40 MG tablet Take 1 tablet (40 mg total) by mouth daily. 90 tablet 3   rosuvastatin  (CRESTOR ) 10 MG tablet TAKE 1 TABLET(10 MG) BY MOUTH DAILY 90 tablet 1   sucralfate  (CARAFATE ) 1 G tablet Take 1 g by mouth as needed. Pt only takes when she's really needs it which isn't very often     azelastine (ASTELIN) 0.1 % nasal spray Place 1-2 sprays into both nostrils 2 (two) times daily. (Patient not taking: Reported on 04/17/2023)     benzonatate  (TESSALON ) 100 MG capsule Take 1 capsule (100 mg total) by mouth 3 (three) times daily as needed for cough. (Patient not taking: Reported on 04/17/2023) 21 capsule 0   No current facility-administered medications on file prior to visit.    Review of Systems  Constitutional:  Negative for chills and fever.  HENT:  Positive for congestion and voice change. Negative for trouble swallowing.   Respiratory:  Positive for cough and wheezing. Negative for shortness of breath.   Cardiovascular:  Negative for chest pain and palpitations.  Gastrointestinal:  Negative for nausea and vomiting.      Objective:    BP 122/68   Pulse 64   Temp (!) 97.2 F (36.2 C) (Oral)   Ht 5' 3 (1.6 m)   Wt 143 lb 6.4 oz (65 kg)   SpO2 99%   BMI 25.40 kg/m   BP Readings from Last 3 Encounters:  04/17/23 122/68  03/21/23 (!) 112/53  03/18/23 (!) 144/69   Wt Readings from Last 3 Encounters:  04/17/23 143 lb 6.4 oz (65 kg)  03/21/23 140 lb (63.5 kg)  03/18/23 139 lb 15.9 oz (63.5 kg)    Physical Exam Vitals reviewed.  Constitutional:      Appearance: She is well-developed.  HENT:     Head: Normocephalic and atraumatic.     Right Ear: Hearing, tympanic membrane, ear canal and external ear normal. No decreased hearing noted. No drainage, swelling  or tenderness. No middle ear effusion. No foreign body. Tympanic membrane is not erythematous or bulging.     Left Ear: Hearing, tympanic membrane, ear canal and external ear normal. No decreased hearing noted. No drainage, swelling or tenderness.  No middle ear effusion. No foreign body. Tympanic membrane is not erythematous or bulging.     Nose: Nose normal. No rhinorrhea.     Right Sinus: No maxillary sinus tenderness or frontal sinus tenderness.     Left Sinus: No maxillary sinus tenderness or frontal sinus tenderness.     Mouth/Throat:     Pharynx: Uvula midline. No oropharyngeal exudate or posterior oropharyngeal erythema.     Tonsils: No tonsillar abscesses.  Eyes:     Conjunctiva/sclera: Conjunctivae normal.  Cardiovascular:     Rate and Rhythm: Regular rhythm.     Pulses: Normal pulses.     Heart sounds: Normal heart sounds.  Pulmonary:  Effort: Pulmonary effort is normal.     Breath sounds: Normal breath sounds. No wheezing, rhonchi or rales.  Lymphadenopathy:     Head:     Right side of head: No submental, submandibular, tonsillar, preauricular, posterior auricular or occipital adenopathy.     Left side of head: No submental, submandibular, tonsillar, preauricular, posterior auricular or occipital adenopathy.     Cervical: No cervical adenopathy.  Skin:    General: Skin is warm and dry.  Neurological:     Mental Status: She is alert.  Psychiatric:        Speech: Speech normal.        Behavior: Behavior normal.        Thought Content: Thought content normal.   Patient felt significantly better after duo neb  treatment. Lung sounds clear and increased.  Chest tightness resolved. HR 65 after treatment.

## 2023-04-17 NOTE — Patient Instructions (Addendum)
 Start mucinex  DM to break up congestion.  Drink plenty of water with this  I prescribed a nebulizer machine with an albuterol  medication to use as needed to help the chest tightness, break up thick secretions  Start doxycycline  along with probiotics  Start prednisone  if needed.  Please ensure you get all doses in by noon each day as prednisone  can interfere with sleep.

## 2023-04-18 ENCOUNTER — Encounter: Payer: Self-pay | Admitting: Pulmonary Disease

## 2023-04-18 DIAGNOSIS — J40 Bronchitis, not specified as acute or chronic: Secondary | ICD-10-CM | POA: Insufficient documentation

## 2023-04-18 NOTE — Assessment & Plan Note (Addendum)
 Afebrile, nontoxic in appearance.  No acute respiratory distress.  Concern for bacterial URI superimposed with history of asthma.  Improvement with DuoNeb.  Provided nebulizer machine and prescribed Xopenex .  Patient has a history of SVT ,asymptomatic today, therefore opted to use Xopenex .  Advised to start mucinex  DM , doxycycline  .  Provided prescription for prednisone  taper however advised her if she felt chest tightness, wheezing resolved with nebulizer, she may monitor and start prednisone  if needed.

## 2023-04-18 NOTE — Telephone Encounter (Signed)
 I have spoke with Mrs. Jacobson and her CT has been scheduled on 05/16/2023 @ 2:00pm at [facility] Entrance

## 2023-04-23 ENCOUNTER — Encounter: Payer: Self-pay | Admitting: Internal Medicine

## 2023-04-24 ENCOUNTER — Other Ambulatory Visit: Payer: Self-pay | Admitting: Internal Medicine

## 2023-04-24 MED ORDER — GUAIFENESIN-CODEINE 100-10 MG/5ML PO SOLN: 5.0000 mL | Freq: Three times a day (TID) | ORAL | 0 refills | Status: DC | PRN

## 2023-04-25 ENCOUNTER — Ambulatory Visit: Payer: Medicare PPO | Admitting: Internal Medicine

## 2023-04-25 ENCOUNTER — Encounter: Payer: Self-pay | Admitting: Family Medicine

## 2023-04-25 ENCOUNTER — Ambulatory Visit (INDEPENDENT_AMBULATORY_CARE_PROVIDER_SITE_OTHER): Payer: Medicare PPO

## 2023-04-25 ENCOUNTER — Ambulatory Visit: Payer: Medicare PPO | Admitting: Family Medicine

## 2023-04-25 VITALS — BP 122/68 | HR 64 | Ht 62.4 in | Wt 142.1 lb

## 2023-04-25 DIAGNOSIS — J40 Bronchitis, not specified as acute or chronic: Secondary | ICD-10-CM

## 2023-04-25 DIAGNOSIS — R059 Cough, unspecified: Secondary | ICD-10-CM | POA: Diagnosis not present

## 2023-04-25 MED ORDER — ALPRAZOLAM 0.25 MG PO TABS: 0.2500 mg | ORAL_TABLET | Freq: Every evening | ORAL | 0 refills | Status: DC | PRN

## 2023-04-25 MED ORDER — PREDNISONE 20 MG PO TABS: 40.0000 mg | ORAL_TABLET | Freq: Every day | ORAL | 0 refills | Status: DC

## 2023-04-25 NOTE — Assessment & Plan Note (Addendum)
Patient with persistent symptoms despite treatment with doxycycline.  We will get a chest x-ray today.  Advised if she has pneumonia on chest x-ray we will proceed with additional antibiotic treatment.  If her chest x-ray is clear she will start on the prednisone that was sent in today at 40 mg once daily.  She was counseled on the risk of agitation and sleep issues.  Patient did wonder if we could refill her Xanax for twice daily use as she has been taking it twice daily since she has been sick as it seems to help with her cough symptoms.  Discussed that I can refill it once daily as as she will run out sooner than typical given that she has been taking it twice daily and we need to treat the underlying cause of her cough as opposed to using Xanax for this.  Discussed holding off on codeine cough syrup use until we see what her x-ray results reveal.

## 2023-04-25 NOTE — Progress Notes (Signed)
Marikay Alar, MD Phone: [phone]  Alison Bradshaw is a 83 y.o. female who presents today for same-day visit.  Cough: Patient notes this has been going on a couple of weeks.  She was previously evaluated by Rennie Plowman and treated with doxycycline.  Patient notes no improvement.  Continues to have some congestion in her chest and is coughing up creamy mucus.  Some postnasal drip.  Some mucus from her nose which is clear.  No sore throat.  No fevers.  Does have some shortness of breath.  Does have some chills.  She has nebulizer treatments helped briefly.  She did not start on the prednisone that was sent in previously.  Social History   Tobacco Use  Smoking Status Former   Types: Cigarettes   Passive exposure: Past  Smokeless Tobacco Never  Tobacco Comments   Quit at age 80, smoked 1 pack a month     Current Outpatient Medications on File Prior to Visit  Medication Sig Dispense Refill   acetaminophen (TYLENOL) 500 MG tablet Take 1,000 mg by mouth every 6 (six) hours as needed.     albuterol (VENTOLIN HFA) 108 (90 Base) MCG/ACT inhaler Inhale 2 puffs into the lungs every 6 (six) hours as needed for shortness of breath. 3 each 0   benzonatate (TESSALON) 100 MG capsule Take 1 capsule (100 mg total) by mouth 3 (three) times daily as needed for cough. 21 capsule 0   fluticasone (FLONASE) 50 MCG/ACT nasal spray Place 1 spray into both nostrils in the morning and at bedtime. 100 mL 2   levalbuterol (XOPENEX) 0.63 MG/3ML nebulizer solution 1.25 mg every 6-8 hours as needed 3 mL 12   pantoprazole (PROTONIX) 40 MG tablet Take 1 tablet (40 mg total) by mouth daily. 90 tablet 3   rosuvastatin (CRESTOR) 10 MG tablet TAKE 1 TABLET(10 MG) BY MOUTH DAILY 90 tablet 1   azelastine (ASTELIN) 0.1 % nasal spray Place 1-2 sprays into both nostrils 2 (two) times daily. (Patient not taking: Reported on 04/25/2023)     budesonide-formoterol (SYMBICORT) 160-4.5 MCG/ACT inhaler Inhale 2 puffs into the  lungs in the morning and at bedtime. (Patient not taking: Reported on 04/25/2023) 1 each 12   guaiFENesin-codeine (VIRTUSSIN A/C) 100-10 MG/5ML syrup Take 5 mLs by mouth 3 (three) times daily as needed for cough. (Patient not taking: Reported on 04/25/2023) 120 mL 0   sucralfate (CARAFATE) 1 G tablet Take 1 g by mouth as needed. Pt only takes when she's really needs it which isn't very often (Patient not taking: Reported on 04/25/2023)     No current facility-administered medications on file prior to visit.     ROS see history of present illness  Objective  Physical Exam Vitals:   04/25/23 1358  BP: 122/68  Pulse: 64  SpO2: 99%    BP Readings from Last 3 Encounters:  04/25/23 122/68  04/17/23 122/68  03/21/23 (!) 112/53   Wt Readings from Last 3 Encounters:  04/25/23 142 lb 1.9 oz (64.5 kg)  04/17/23 143 lb 6.4 oz (65 kg)  03/21/23 140 lb (63.5 kg)    Physical Exam Constitutional:      General: She is not in acute distress.    Appearance: She is not diaphoretic.  Cardiovascular:     Rate and Rhythm: Normal rate and regular rhythm.     Heart sounds: Normal heart sounds.  Pulmonary:     Effort: Pulmonary effort is normal.     Breath sounds: Normal breath  sounds.  Lymphadenopathy:     Cervical: No cervical adenopathy.  Skin:    General: Skin is warm and dry.  Neurological:     Mental Status: She is alert.      Assessment/Plan: Please see individual problem list.  Bronchitis Assessment & Plan: Patient with persistent symptoms despite treatment with doxycycline.  We will get a chest x-ray today.  Advised if she has pneumonia on chest x-ray we will proceed with additional antibiotic treatment.  If her chest x-ray is clear she will start on the prednisone that was sent in today at 40 mg once daily.  She was counseled on the risk of agitation and sleep issues.  Patient did wonder if we could refill her Xanax for twice daily use as she has been taking it twice daily since  she has been sick as it seems to help with her cough symptoms.  Discussed that I can refill it once daily as as she will run out sooner than typical given that she has been taking it twice daily and we need to treat the underlying cause of her cough as opposed to using Xanax for this.  Discussed holding off on codeine cough syrup use until we see what her x-ray results reveal.  Orders: -     predniSONE; Take 2 tablets (40 mg total) by mouth daily with breakfast.  Dispense: 10 tablet; Refill: 0 -     DG Chest 2 View; Future  Other orders -     ALPRAZolam; Take 1 tablet (0.25 mg total) by mouth at bedtime as needed for anxiety.  Dispense: 30 tablet; Refill: 0     Return if symptoms worsen or fail to improve.   Marikay Alar, MD [facility]

## 2023-04-25 NOTE — Patient Instructions (Signed)
Nice to see you. We will get a chest x-ray today. If your chest x-ray is clear you will start on the prednisone.  If your chest x-ray shows pneumonia we will contact you and send in a new antibiotic.

## 2023-05-05 ENCOUNTER — Encounter: Payer: Self-pay | Admitting: Pulmonary Disease

## 2023-05-05 ENCOUNTER — Ambulatory Visit: Payer: Medicare PPO | Admitting: Pulmonary Disease

## 2023-05-05 VITALS — BP 120/82 | HR 69 | Temp 97.3°F | Ht 62.4 in | Wt 145.0 lb

## 2023-05-05 DIAGNOSIS — J4530 Mild persistent asthma, uncomplicated: Secondary | ICD-10-CM | POA: Diagnosis not present

## 2023-05-05 DIAGNOSIS — R053 Chronic cough: Secondary | ICD-10-CM

## 2023-05-05 LAB — NITRIC OXIDE: Nitric Oxide: 9

## 2023-05-05 MED ORDER — AEROCHAMBER MV MISC: 0 refills | Status: AC

## 2023-05-05 NOTE — Progress Notes (Signed)
Synopsis: Referred in by Sherlene Shams, MD   Subjective:   PATIENT ID: Alison Bradshaw GENDER: female DOB: Oct 01, 1940, MRN: [id number]  Chief Complaint  Patient presents with   Follow-up    Drainage. Cough with clear/white sputum. DOE.     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 Duncan Dull for further evaluation on her asthma.   I initially saw her in August 2024, Patient reports that about 3 months ago she started having labored breathing.  Describes it as feeling of working hard to take a deep breath in.  She notices that usually during hot humid weather.  She denies any wheezing.  She denies any chest pain. She does report seasonal nasal congestion and possible post nasal drip.   We started her on Symbicort 80-4.5 which helped with her breathing. She stopped her symbicort as it caused her hoarseness of the voice.   Today reports feeling unwell with persistent cough and persistent runny nose.   PFTs with obstructive defect and good response to BDL. DLCO is normal. Overall consistent with asthma.   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 New solid nodule in the RLL.   Family history - Emphysema in the family   Social History - Never smoker, denies alcohol use, denies any illict drug use. Has a dog at home and worked as a Warden/ranger.    ROS All systems were reviewed and are negative except for the above.  Objective:   Vitals:   05/05/23 1621  BP: 120/82  Pulse: 69  Temp: (!) 97.3 F (36.3 C)  SpO2: 95%  Weight: 145 lb (65.8 kg)  Height: 5' 2.4" (1.585 m)   95% on RA BMI Readings from Last 3 Encounters:  05/05/23 26.18 kg/m  04/25/23 25.66 kg/m  04/17/23 25.40 kg/m   Wt Readings from Last 3 Encounters:  05/05/23 145 lb (65.8 kg)   04/25/23 142 lb 1.9 oz (64.5 kg)  04/17/23 143 lb 6.4 oz (65 kg)    Physical Exam GEN: NAD, Healthy Appearing HEENT: Supple Neck, Reactive Pupils, EOMI, erythematous nasal mucosa. CVS: Normal S1, Normal S2, RRR, No murmurs or ES appreciated  Lungs: Clear bilateral air entry.  Abdomen: Soft, non tender, non distended, + BS  Extremities: Warm and well perfused, No edema  Skin: No suspicious lesions appreciated  Psych: Normal Affect  Ancillary Information   CBC    Component Value Date/Time   WBC 6.6 03/18/2023 1800   RBC 4.36 03/18/2023 1800   HGB 13.4 03/18/2023 1800   HCT 39.6 03/18/2023 1800   PLT 184 03/18/2023 1800   MCV 90.8 03/18/2023 1800   MCH 30.7 03/18/2023 1800   MCHC 33.8 03/18/2023 1800   RDW 13.5 03/18/2023 1800   LYMPHSABS 1.1 03/18/2023 1800   MONOABS 0.6 03/18/2023 1800   EOSABS 0.1 03/18/2023 1800   BASOSABS 0.0 03/18/2023 1800    PFT 2019 -  FVC: 3.05 L (115 %pred), FEV1: 1.81 L (89 %pred), FEV1/FVC: 60%, TLC: 4.64 L (93 %pred), DLCO 82 %pred  CT chest 09/07/2018  There is bandlike scarring of the right middle lobe and lingula with clustered nodularity of the right upper lobe. Previously noted clustered nodules of the posterior right upper lobe in this vicinity are almost completely resolved. 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 No new nodules are noted.  CT chest 2024 1. Clustered bilateral solid pulmonary nodules and ground-glass opacities most pronounced in the posterior right upper lobe, right middle lobe and lingula, slightly worsened when compared with prior exam with some pulmonary nodules increased in size and others new. 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 2. New irregular solid pulmonary nodule of the right lower lobe measuring 6 mm. Non-contrast chest CT at 3-6 months is recommended. If the nodules are stable at time of  repeat CT, then future CT at 18-24 months (from today's scan) is considered optional for low-risk patients, but is recommended for high-risk patients. This recommendation follows the consensus statement: Guidelines for Management of Incidental Pulmonary Nodules Detected on CT Images: From the Fleischner Society 2017; Radiology 2017; 284:228-243. 3. Mild coronary artery calcifications and aortic Atherosclerosis (ICD10-I70.0).    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 Duncan Dull for further evaluation of shortness of breath.  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 Good response to bronchodilators. Latter suggestive of persistent asthma with airway remodeling.   []  Restart Budesonide-Formoterol [Symbicort] to 160-4.58mcg 2 puffs twice a day with aerochamber to limit laryngeal irritation.  []  c/w Albuterol Inh PRN 2 puffs Q6H  []  c/w Flonase 1 puff each nares bid for 30 days.  []  Allergen panel and IGE (Normal) []  Pending CT chest in February 2025.   No follow-ups on file.  I spent 45 minutes caring for this patient today,  including preparing to see the patient, obtaining a medical history , reviewing a separately obtained history, performing a medically appropriate examination and/or evaluation, counseling and educating the patient/family/caregiver, ordering medications, tests, or procedures, documenting clinical information in the electronic health record, and independently interpreting results (not separately reported/billed) and communicating results to the patient/family/caregiver  Janann Colonel, MD [facility] 05/05/2023 4:23 PM

## 2023-05-06 ENCOUNTER — Encounter (INDEPENDENT_AMBULATORY_CARE_PROVIDER_SITE_OTHER): Payer: Self-pay | Admitting: Vascular Surgery

## 2023-05-06 ENCOUNTER — Ambulatory Visit (INDEPENDENT_AMBULATORY_CARE_PROVIDER_SITE_OTHER): Payer: Medicare PPO

## 2023-05-06 ENCOUNTER — Ambulatory Visit (INDEPENDENT_AMBULATORY_CARE_PROVIDER_SITE_OTHER): Payer: Medicare PPO | Admitting: Vascular Surgery

## 2023-05-06 VITALS — BP 118/71 | HR 60 | Resp 18 | Ht 63.0 in | Wt 143.8 lb

## 2023-05-06 DIAGNOSIS — R7303 Prediabetes: Secondary | ICD-10-CM

## 2023-05-06 DIAGNOSIS — E782 Mixed hyperlipidemia: Secondary | ICD-10-CM

## 2023-05-06 DIAGNOSIS — I739 Peripheral vascular disease, unspecified: Secondary | ICD-10-CM

## 2023-05-06 NOTE — Assessment & Plan Note (Signed)
Previous ABIs had been reduced so arterial duplex of the lower extremities were performed today.  This demonstrated patent arterial system throughout with mild atherosclerosis and no clear hemodynamically significant stenosis.  She had biphasic waveforms throughout.  Given these findings, I do not see a significant role for anything invasive from a vascular standpoint.  She certainly does not have limb threatening symptoms and her studies would not indicate significant disease would benefit from angiography or further treatment.  I will just plan to see her back in 6 months with noninvasive studies.  She will contact our office with problems in the interim.

## 2023-05-06 NOTE — Progress Notes (Signed)
MRN : [id number]  Alison Bradshaw is a 83 y.o. (1940-11-11) female who presents with chief complaint of  Chief Complaint  Patient presents with   Follow-up    fu first available + Bilat LE Arterial  .  History of Present Illness: Patient returns today in follow up.  Since her last visit, she went to the [place] to visit family.  She was walking reasonably well.  The majority of her cramps and issues with her legs are actually at rest and some positions seem to exacerbate it such as laying flat.  She does not have any ulceration or infection.  No fever or chills.  Previous ABIs had been reduced so arterial duplex of the lower extremities were performed today.  This demonstrated patent arterial system throughout with mild atherosclerosis and no clear hemodynamically significant stenosis.  She had biphasic waveforms throughout.  Current Outpatient Medications  Medication Sig Dispense Refill   acetaminophen (TYLENOL) 500 MG tablet Take 1,000 mg by mouth every 6 (six) hours as needed.     albuterol (VENTOLIN HFA) 108 (90 Base) MCG/ACT inhaler Inhale 2 puffs into the lungs every 6 (six) hours as needed for shortness of breath. 3 each 0   ALPRAZolam (XANAX) 0.25 MG tablet Take 1 tablet (0.25 mg total) by mouth at bedtime as needed for anxiety. 30 tablet 0   azelastine (ASTELIN) 0.1 % nasal spray Place 1-2 sprays into both nostrils 2 (two) times daily.     budesonide-formoterol (SYMBICORT) 160-4.5 MCG/ACT inhaler Inhale 2 puffs into the lungs in the morning and at bedtime. 1 each 12   fluticasone (FLONASE) 50 MCG/ACT nasal spray Place 1 spray into both nostrils in the morning and at bedtime. 100 mL 2   guaiFENesin-codeine (VIRTUSSIN A/C) 100-10 MG/5ML syrup Take 5 mLs by mouth 3 (three) times daily as needed for cough. 120 mL 0   levalbuterol (XOPENEX) 0.63 MG/3ML nebulizer solution 1.25 mg every 6-8 hours as needed 3 mL 12   pantoprazole (PROTONIX) 40 MG tablet Take 1 tablet (40 mg total) by  mouth daily. 90 tablet 3   rosuvastatin (CRESTOR) 10 MG tablet TAKE 1 TABLET(10 MG) BY MOUTH DAILY 90 tablet 1   Spacer/Aero-Holding Chambers (AEROCHAMBER MV) inhaler Use as instructed 1 each 0   sucralfate (CARAFATE) 1 G tablet Take 1 g by mouth as needed. Pt only takes when she's really needs it which isn't very often     benzonatate (TESSALON) 100 MG capsule Take 1 capsule (100 mg total) by mouth 3 (three) times daily as needed for cough. (Patient not taking: Reported on 05/06/2023) 21 capsule 0   predniSONE (DELTASONE) 20 MG tablet Take 2 tablets (40 mg total) by mouth daily with breakfast. (Patient not taking: Reported on 05/06/2023) 10 tablet 0   No current facility-administered medications for this visit.    Past Medical History:  Diagnosis Date   Anxiety    Asthma    Atypical chest pain    Barrett's esophagus    Chronic gastritis    Diverticulosis    Duodenal ulcer    Gastritis    GERD (gastroesophageal reflux disease)    Hiatal hernia    History of echocardiogram    a. 08/2020 Echo: EF 60-65%, no rwma, Nl RV fxn, mild MR.   Hyperlipidemia    Non-obstructive CAD (coronary artery disease)    a. 10/2017 Stress Echo: Nl EF, no ischemia. Mild MR; b. 12/2017 Cardiac CTA: LM nl, LAD 50p, 30d, D1/2 nl,  RI nl, LCX nl, OM1/2 nl, RCA nl, RPDA/RPL nl. Ca2+ score = 39 (46th percentile)-->statin dose escalated; c. 10/2021 MV: No ischemia/infarct. Nl LV fxn.   Osteopenia    Peptic ulcer, site unspecified, unspecified as acute or chronic, without hemorrhage or perforation 11/19/2012   Formatting of this note might be different from the original.  duodenum 1966  Formatting of this note might be different from the original.  Overview:   duodenum 1966   PSVT (paroxysmal supraventricular tachycardia) (HCC)    a. 10/2017 Holter: PSVT, max 5 beats->did not tolerate beta blocker; b. 09/2020 Zio: 38 runs of SVT. Longest 18.1 secs (106), fastest 203 bpm (6 beats). Occas PVCs (1.1%) - some assoc w/ triggered  events; C. 11/2022 Zio: Predominantly sinus rhythm @ 69 (48-182).  34 runs of PSVT, longest 12 beats.  3.3% PVC burden.  No sustained arrhythmias or A-fib.   Pulmonary nodules    a. 12/2017 Cardiac CTA: RLL 9mm nodule; b. 04/2018 RLL 9mm nodule resolved; c. 09/2018 CT Chest: Previously noted posterior RUL nodules almost completely resolved. Bandlike scarring of RML and lingula-->atyp infxn; d. 11/2022 CT Chest: Clustered bilateral solid pulmonary nodules and groundglass opacities, most pronounced in posterior RUL, RML, and lingula - ? chronic atyp infxn. New 6mm RLL nodule.   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   BREAST BIOPSY Left 2008   neg   BREAST EXCISIONAL BIOPSY Left 2004   neg   CATARACT EXTRACTION W/ INTRAOCULAR LENS  IMPLANT, BILATERAL Bilateral    COLONOSCOPY WITH PROPOFOL N/A 01/22/2017   Procedure: COLONOSCOPY WITH PROPOFOL;  Surgeon: Toledo, Boykin Nearing, MD;  Location: [facility] ENDOSCOPY;  Service: Gastroenterology;  Laterality: N/A;   COLONOSCOPY WITH PROPOFOL N/A 03/26/2022   Procedure: COLONOSCOPY WITH PROPOFOL;  Surgeon: Regis Bill, MD;  Location: [facility] ENDOSCOPY;  Service: Endoscopy;  Laterality: N/A;   ESOPHAGOGASTRODUODENOSCOPY (EGD) WITH PROPOFOL N/A 01/20/2015   Procedure: ESOPHAGOGASTRODUODENOSCOPY (EGD) WITH PROPOFOL;  Surgeon: Christena Deem, MD;  Location: [facility] ENDOSCOPY;  Service: Endoscopy;  Laterality: N/A;   ESOPHAGOGASTRODUODENOSCOPY (EGD) WITH PROPOFOL N/A 01/22/2017   Procedure: ESOPHAGOGASTRODUODENOSCOPY (EGD) WITH PROPOFOL;  Surgeon: Toledo, Boykin Nearing, MD;  Location: [facility] ENDOSCOPY;  Service: Gastroenterology;   Laterality: N/A;   ESOPHAGOGASTRODUODENOSCOPY (EGD) WITH PROPOFOL N/A 04/14/2018   Procedure: ESOPHAGOGASTRODUODENOSCOPY (EGD) WITH PROPOFOL;  Surgeon: Christena Deem, MD;  Location: [facility] ENDOSCOPY;  Service: Endoscopy;  Laterality: N/A;   ESOPHAGOGASTRODUODENOSCOPY (EGD) WITH PROPOFOL N/A 10/16/2021   Procedure: ESOPHAGOGASTRODUODENOSCOPY (EGD) WITH PROPOFOL;  Surgeon: Regis Bill, MD;  Location: [facility] ENDOSCOPY;  Service: Endoscopy;  Laterality: N/A;   EYE SURGERY Right    laser scraping   OPEN REDUCTION INTERNAL FIXATION (ORIF) DISTAL RADIAL FRACTURE Right 02/09/2018   Procedure: OPEN REDUCTION INTERNAL FIXATION (ORIF) DISTAL RADIAL FRACTURE;  Surgeon: Kennedy Bucker, MD;  Location: [facility] ORS;  Service: Orthopedics;  Laterality: Right;   TONSILLECTOMY       Social History   Tobacco Use   Smoking status: Former    Types: Cigarettes    Passive exposure: Past   Smokeless tobacco: Never   Tobacco comments:    Quit at age  32, smoked 1 pack a month   Vaping Use   Vaping status: Never Used  Substance Use Topics   Alcohol use: Yes    Alcohol/week: 2.0 standard drinks of alcohol    Types: 2 Glasses of wine per week    Comment: weekly,none last 24 hrs   Drug use: No      Family History  Problem Relation Age of Onset   Hyperlipidemia Mother    Alzheimer's disease Mother    Emphysema Father    AAA (abdominal aortic aneurysm) Father    Kidney Stones Father    Kidney cancer Brother    Pancreatic cancer Paternal Uncle    Diabetes Paternal Uncle    Stomach cancer Neg Hx    Colon cancer Neg Hx    Esophageal cancer Neg Hx      Allergies  Allergen Reactions   Percocet [Oxycodone-Acetaminophen] Nausea Only   Ceftin [Cefuroxime Axetil] Hives and Rash   Timentin [Ticarcillin-Pot Clavulanate] Hives and Rash     REVIEW OF SYSTEMS (Negative unless checked)   Constitutional: [] Weight loss  [] Fever  [] Chills Cardiac: [] Chest pain   [] Chest pressure   [] Palpitations    [] Shortness of breath when laying flat   [] Shortness of breath at rest   [] Shortness of breath with exertion. Vascular:  [] Pain in legs with walking   [] Pain in legs at rest   [] Pain in legs when laying flat   [] Claudication   [] Pain in feet when walking  [] Pain in feet at rest  [] Pain in feet when laying flat   [] History of DVT   [] Phlebitis   [] Swelling in legs   [] Varicose veins   [] Non-healing ulcers Pulmonary:   [] Uses home oxygen   [] Productive cough   [] Hemoptysis   [] Wheeze  [] COPD   [x] Asthma Neurologic:  [] Dizziness  [] Blackouts   [] Seizures   [] History of stroke   [] History of TIA  [] Aphasia   [] Temporary blindness   [] Dysphagia   [] Weakness or numbness in arms   [x] Weakness or numbness in legs Musculoskeletal:  [] Arthritis   [] Joint swelling   [] Joint pain   [] Low back pain Hematologic:  [] Easy bruising  [] Easy bleeding   [] Hypercoagulable state   [] Anemic  [] Hepatitis Gastrointestinal:  [] Blood in stool   [] Vomiting blood  [x] Gastroesophageal reflux/heartburn   [] Abdominal pain Genitourinary:  [] Chronic kidney disease   [] Difficult urination  [] Frequent urination  [] Burning with urination   [] Hematuria Skin:  [] Rashes   [] Ulcers   [] Wounds Psychological:  [x] History of anxiety   []  History of major depression.  Physical Examination  BP 118/71   Pulse 60   Resp 18   Ht 5\' 3"  (1.6 m)   Wt 143 lb 12.8 oz (65.2 kg)   BMI 25.47 kg/m  Gen:  WD/WN, NAD. Appears younger than stated age. Head: [place]/AT, No temporalis wasting. Ear/Nose/Throat: Hearing grossly intact, nares w/o erythema or drainage Eyes: Conjunctiva clear. Sclera non-icteric Neck: Supple.  Trachea midline Pulmonary:  Good air movement, no use of accessory muscles.  Cardiac: RRR, no JVD Vascular:  Vessel Right Left  Radial Palpable Palpable                          PT 1+ Palpable 1+ Palpable  DP 1+ Palpable 1+ Palpable   Gastrointestinal: soft, non-tender/non-distended. No guarding/reflex.  Musculoskeletal: M/S  5/5 throughout.  No deformity or atrophy. Trace BLE edema. Neurologic: Sensation grossly intact in extremities.  Symmetrical.  Speech is fluent.  Psychiatric: Judgment intact, Mood & affect appropriate for pt's clinical situation. Dermatologic: No rashes or ulcers noted.  No cellulitis or open wounds.      Labs Recent Results (from the past 2160 hours)  POC COVID-19     Status: None   Collection Time: 02/14/23  1:02 PM  Result Value Ref Range   SARS Coronavirus 2 Ag Negative Negative  Nitric oxide     Status: None   Collection Time: 02/25/23 10:53 AM  Result Value Ref Range   Nitric Oxide 15   Allergen Panel (27) + IGE     Status: None   Collection Time: 02/25/23 11:26 AM  Result Value Ref Range   Class Description Allergens Comment     Comment: (NOTE)    Levels of Specific IgE       Class  Description of Class    ---------------------------  -----  --------------------                   < 0.10         0         Negative           0.10 -    0.31         0/I       Equivocal/Low           0.32 -    0.55         I         Low           0.56 -    1.40         II        Moderate           1.41 -    3.90         III       High           3.91 -   19.00         IV        Very High          19.01 -  100.00         V         Very High                  >100.00         VI        Very High    IgE (Immunoglobulin E), Serum 102 6 - 495 IU/mL   D Pteronyssinus IgE <0.10 Class 0 kU/L   D Farinae IgE <0.10 Class 0 kU/L   Cat Dander IgE <0.10 Class 0 kU/L   Dog Dander IgE <0.10 Class 0 kU/L   [place] Grass IgE <0.10 Class 0 kU/L   Timothy Grass IgE <0.10 Class 0 kU/L   [place] Bluegrass IgE <0.10 Class 0 kU/L   [place] Grass IgE <0.10 Class 0 kU/L   B[place]ia Grass IgE <0.10 Class 0 kU/L   Cockroach, American IgE <0.10 Class 0 kU/L    Comment: (NOTE) This test was developed and its performance characteristics determined by [facility].  It has not been cleared or approved by the U.S. Food and  Drug Administration. The FDA has determined that such clearance or approval is not necessary. This test is used for clinical purposes.  It should not be regarded as investigational or for research.    Penicillium Chrysogen IgE <0.10 Class 0 kU/L  Cladosporium Herbarum IgE <0.10 Class 0 kU/L   Aspergillus Fumigatus IgE <0.10 Class 0 kU/L   Mucor Racemosus IgE <0.10 Class 0 kU/L   Alternaria Alternata IgE <0.10 Class 0 kU/L   Setomelanomma Rostrat <0.10 Class 0 kU/L   Oak, White IgE <0.10 Class 0 kU/L   Elm, American IgE <0.10 Class 0 kU/L   Maple/Box Elder IgE <0.10 Class 0 kU/L   Common Silver Charletta Cousin IgE <0.10 Class 0 kU/L   Hickory, White IgE <0.10 Class 0 kU/L    Comment: (NOTE) This test was developed and its performance characteristics determined by [facility].  It has not been cleared or approved by the U.S. Food and Drug Administration. The FDA has determined that such clearance or approval is not necessary. This test is used for clinical purposes.  It should not be regarded as investigational or for research.    White Mulberry IgE <0.10 Class 0 kU/L   Cedar, [place] IgE <0.10 Class 0 kU/L   Ragweed, Short IgE <0.10 Class 0 kU/L   Plantain, English IgE <0.10 Class 0 kU/L   Cocklebur IgE <0.10 Class 0 kU/L   Pigweed, Rough IgE <0.10 Class 0 kU/L    Comment: (NOTE) Performed At: [facility] [address] [place], [place] [id number] Jolene Schimke MD WU:[id number]   CBC with Differential     Status: None   Collection Time: 03/18/23  6:00 PM  Result Value Ref Range   WBC 6.6 4.0 - 10.5 K/uL   RBC 4.36 3.87 - 5.11 MIL/uL   Hemoglobin 13.4 12.0 - 15.0 g/dL   HCT 95.6 21.3 - 08.6 %   MCV 90.8 80.0 - 100.0 fL   MCH 30.7 26.0 - 34.0 pg   MCHC 33.8 30.0 - 36.0 g/dL   RDW 57.8 46.9 - 62.9 %   Platelets 184 150 - 400 K/uL   nRBC 0.0 0.0 - 0.2 %   Neutrophils Relative % 71 %   Neutro Abs 4.7 1.7 - 7.7 K/uL   Lymphocytes Relative 17 %   Lymphs Abs 1.1 0.7 - 4.0 K/uL    Monocytes Relative 10 %   Monocytes Absolute 0.6 0.1 - 1.0 K/uL   Eosinophils Relative 1 %   Eosinophils Absolute 0.1 0.0 - 0.5 K/uL   Basophils Relative 1 %   Basophils Absolute 0.0 0.0 - 0.1 K/uL   Immature Granulocytes 0 %   Abs Immature Granulocytes 0.02 0.00 - 0.07 K/uL    Comment: Performed at [facility], [address] [place], [place] [address]  Comprehensive metabolic panel     Status: Abnormal   Collection Time: 03/18/23  6:00 PM  Result Value Ref Range   Sodium 137 135 - 145 mmol/L   Potassium 3.7 3.5 - 5.1 mmol/L   Chloride 101 98 - 111 mmol/L   CO2 28 22 - 32 mmol/L   Glucose, Bld 100 (H) 70 - 99 mg/dL    Comment: Glucose reference range applies only to samples taken after fasting for at least 8 hours.   BUN 11 8 - 23 mg/dL   Creatinine, Ser 3.24 0.44 - 1.00 mg/dL   Calcium 9.1 8.9 - 40.1 mg/dL   Total Protein 7.1 6.5 - 8.1 g/dL   Albumin 4.2 3.5 - 5.0 g/dL   AST 29 15 - 41 U/L   ALT 22 0 - 44 U/L   Alkaline Phosphatase 72 38 - 126 U/L   Total Bilirubin 1.0 <1.2 mg/dL   GFR, Estimated >02 >72  mL/min    Comment: (NOTE) Calculated using the CKD-EPI Creatinine Equation (2021)    Anion gap 8 5 - 15    Comment: Performed at [facility], [address] [place], [place] [address]  CK     Status: None   Collection Time: 03/18/23  6:00 PM  Result Value Ref Range   Total CK 101 38 - 234 U/L    Comment: Performed at [facility], [address] [place], [place] [address]  Nitric oxide     Status: None   Collection Time: 05/05/23  4:29 PM  Result Value Ref Range   Nitric Oxide 9     Radiology DG Chest 2 View Result Date: 04/25/2023 CLINICAL DATA:  Ongoing cough. EXAM: CHEST - 2 VIEW COMPARISON:  02/14/2023 and older. FINDINGS: Hyperinflation. No consolidation, pneumothorax or effusion. No edema. Normal cardiopericardial silhouette. Degenerative changes along the spine. Slight curvature of the spine. IMPRESSION: Hyperinflation.  No  acute cardiopulmonary disease. Electronically Signed   By: Karen Kays M.D.   On: 04/25/2023 15:39    Assessment/Plan  PAD (peripheral artery disease) (HCC) Previous ABIs had been reduced so arterial duplex of the lower extremities were performed today.  This demonstrated patent arterial system throughout with mild atherosclerosis and no clear hemodynamically significant stenosis.  She had biphasic waveforms throughout.  Given these findings, I do not see a significant role for anything invasive from a vascular standpoint.  She certainly does not have limb threatening symptoms and her studies would not indicate significant disease would benefit from angiography or further treatment.  I will just plan to see her back in 6 months with noninvasive studies.  She will contact our office with problems in the interim.  Mixed hyperlipidemia lipid control important in reducing the progression of atherosclerotic disease. Continue statin therapy     Prediabetes blood glucose control important in reducing the progression of atherosclerotic disease. Also, involved in wound healing.  Festus Barren, MD  05/06/2023 5:01 PM    This note was created with Dragon medical transcription system.  Any errors from dictation are purely unintentional

## 2023-05-16 ENCOUNTER — Ambulatory Visit
Admission: RE | Admit: 2023-05-16 | Discharge: 2023-05-16 | Disposition: A | Discharge status: Home / Self Care | Payer: Medicare PPO | Source: Ambulatory Visit | Attending: Pulmonary Disease | Admitting: Pulmonary Disease

## 2023-05-16 DIAGNOSIS — R918 Other nonspecific abnormal finding of lung field: Secondary | ICD-10-CM | POA: Insufficient documentation

## 2023-05-16 DIAGNOSIS — R053 Chronic cough: Secondary | ICD-10-CM | POA: Insufficient documentation

## 2023-05-16 DIAGNOSIS — I700 Atherosclerosis of aorta: Secondary | ICD-10-CM | POA: Diagnosis not present

## 2023-05-16 DIAGNOSIS — J984 Other disorders of lung: Secondary | ICD-10-CM | POA: Diagnosis not present

## 2023-05-18 DIAGNOSIS — J45909 Unspecified asthma, uncomplicated: Secondary | ICD-10-CM | POA: Diagnosis not present

## 2023-05-18 DIAGNOSIS — J45998 Other asthma: Secondary | ICD-10-CM | POA: Diagnosis not present

## 2023-05-21 ENCOUNTER — Telehealth: Payer: Self-pay | Admitting: Pulmonary Disease

## 2023-05-21 NOTE — Telephone Encounter (Signed)
Vent that \\goes  on the end of her inhaler has stopped making the whistling sound to let her know if she is breathing too fast. She doesn't know if she needs a new one or not and if so where to get it.

## 2023-05-22 NOTE — Telephone Encounter (Signed)
Patient reports that the aerochamber (easyvent) is not making the whistling noise. And wants to know if the device is still okay to use. She reports that no matter what she does it won't whistle. Please advise.

## 2023-05-28 ENCOUNTER — Encounter: Payer: Self-pay | Admitting: Pulmonary Disease

## 2023-05-28 ENCOUNTER — Ambulatory Visit: Payer: Medicare PPO | Admitting: Pulmonary Disease

## 2023-05-28 ENCOUNTER — Other Ambulatory Visit: Payer: Self-pay | Admitting: Internal Medicine

## 2023-05-28 VITALS — BP 106/60 | HR 63 | Temp 97.6°F | Ht 63.0 in | Wt 142.6 lb

## 2023-05-28 DIAGNOSIS — J4550 Severe persistent asthma, uncomplicated: Secondary | ICD-10-CM

## 2023-05-28 MED ORDER — AEROCHAMBER MV MISC: 0 refills | Status: DC

## 2023-05-28 NOTE — Progress Notes (Signed)
Synopsis: Referred in by Sherlene Shams, MD   Subjective:   PATIENT ID: Alison Bradshaw GENDER: female DOB: 02/21/1941, MRN: [id number]  Chief Complaint  Patient presents with   Follow-up    Cough has improved. Constant clearing of throat and runny nose.     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 Duncan Dull for further evaluation on her asthma.   I initially saw her in August 2024, Patient reports that about 3 months ago she started having labored breathing.  Describes it as feeling of working hard to take a deep breath in.  She notices that usually during hot humid weather.  She denies any wheezing.  She denies any chest pain. She does report seasonal nasal congestion and possible post nasal drip.   We started her on Symbicort 80-4.5 which helped with her breathing. She stopped her symbicort as it caused her hoarseness of the voice.   She reports feeling better today, still with cough and constant clearance of throat. Using her rescue inhaler once a week. Feels better using the aerochamber device.   PFTs with obstructive defect and good response to BDL. DLCO is normal. Overall consistent with asthma.   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 New solid nodule in the RLL.   Family history - Emphysema in the family   Social History - Never smoker, denies alcohol use, denies any illict drug use. Has a dog at home and worked as a Warden/ranger.    ROS All systems were reviewed and are negative except for the above.  Objective:   Vitals:   05/28/23 0857  BP: 106/60  Pulse: 63  Temp: 97.6 F (36.4 C)  TempSrc: Temporal  SpO2: 99%  Weight: 142 lb 9.6 oz (64.7 kg)  Height: 5\' 3"  (1.6 m)   99% on RA BMI Readings from Last 3 Encounters:  05/28/23  25.26 kg/m  05/06/23 25.47 kg/m  05/05/23 26.18 kg/m   Wt Readings from Last 3 Encounters:  05/28/23 142 lb 9.6 oz (64.7 kg)  05/06/23 143 lb 12.8 oz (65.2 kg)  05/05/23 145 lb (65.8 kg)    Physical Exam GEN: NAD, Healthy Appearing HEENT: Supple Neck, Reactive Pupils, EOMI, erythematous nasal mucosa. CVS: Normal S1, Normal S2, RRR, No murmurs or ES appreciated  Lungs: Clear bilateral air entry.  Abdomen: Soft, non tender, non distended, + BS  Extremities: Warm and well perfused, No edema  Skin: No suspicious lesions appreciated  Psych: Normal Affect  Ancillary Information   CBC    Component Value Date/Time   WBC 6.6 03/18/2023 1800   RBC 4.36 03/18/2023 1800   HGB 13.4 03/18/2023 1800   HCT 39.6 03/18/2023 1800   PLT 184 03/18/2023 1800   MCV 90.8 03/18/2023 1800   MCH 30.7 03/18/2023 1800   MCHC 33.8 03/18/2023 1800   RDW 13.5 03/18/2023 1800   LYMPHSABS 1.1 03/18/2023 1800   MONOABS 0.6 03/18/2023 1800   EOSABS 0.1 03/18/2023 1800   BASOSABS 0.0 03/18/2023 1800    PFT 2019 -  FVC: 3.05 L (115 %pred), FEV1: 1.81 L (89 %pred), FEV1/FVC: 60%, TLC: 4.64 L (93 %pred), DLCO 82 %pred  CT chest 09/07/2018  There is bandlike scarring of the right middle lobe and lingula with clustered nodularity of  the right upper lobe. Previously noted clustered nodules of the posterior right upper lobe in this vicinity are almost completely resolved. 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 No new nodules are noted.  CT chest 2024 1. Clustered bilateral solid pulmonary nodules and ground-glass opacities most pronounced in the posterior right upper lobe, right middle lobe and lingula, slightly worsened when compared with prior exam with some pulmonary nodules increased in size and others new. 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 2. New irregular solid pulmonary nodule of  the right lower lobe measuring 6 mm. Non-contrast chest CT at 3-6 months is recommended. If the nodules are stable at time of repeat CT, then future CT at 18-24 months (from today's scan) is considered optional for low-risk patients, but is recommended for high-risk patients. This recommendation follows the consensus statement: Guidelines for Management of Incidental Pulmonary Nodules Detected on CT Images: From the Fleischner Society 2017; Radiology 2017; 284:228-243. 3. Mild coronary artery calcifications and aortic Atherosclerosis (ICD10-I70.0).    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 Duncan Dull for further evaluation of shortness of breath.  #Mild persistent asthma without exacerbation  #Granulomatous lung disease? Tree in bud in the RUL with some scarring ( could be NTM could also be lung scarring from prior pneumonia sequelae)  #Allergic rhinitis  #RLL 6 mm nodule  EOS 200  PFTs with obstructive defect and mildly reduced DLCO but when corrected to VA it is normal. Good response to bronchodilators. Latter suggestive of persistent asthma with airway remodeling.   []  C/w Budesonide-Formoterol [Symbicort] to 160-4.28mcg 2 puffs twice a day with aerochamber to limit laryngeal irritation.  []  c/w Albuterol Inh PRN 2 puffs Q6H  []  c/w Flonase 1 puff each nares daily []  Start ipratropium 1spray each  nostril bid. .  []  Allergen panel and IGE (Normal) []  Will consider starting dupixent on next visit.    No follow-ups on file.  I spent 34 minutes caring for this patient today, including preparing to see the patient, obtaining a medical history , reviewing a separately obtained history, performing a medically appropriate examination and/or evaluation, counseling and educating the patient/family/caregiver, ordering medications, tests, or procedures, documenting clinical information in the electronic health record, and independently interpreting results (not separately reported/billed) and communicating results to the patient/family/caregiver  Janann Colonel, MD [facility] 05/28/2023 9:11 AM

## 2023-06-09 ENCOUNTER — Ambulatory Visit: Payer: Self-pay | Admitting: Pulmonary Disease

## 2023-06-09 DIAGNOSIS — R0602 Shortness of breath: Secondary | ICD-10-CM

## 2023-06-09 MED ORDER — PREDNISONE 20 MG PO TABS: 40.0000 mg | ORAL_TABLET | Freq: Every day | ORAL | 0 refills | Status: DC

## 2023-06-09 NOTE — Telephone Encounter (Signed)
 The patient reported a cough that has been ongoing since October but has worsened in the past 2 weeks.  She is scheduled to follow up with Dr. Larinda Buttery in May but is requesting to be seen sooner for a treatment plan as she feels that her symptoms are progressing.  She stated that she is coughing constantly and her sputum is cream colored and a thick consistency.  She reported "tight bronchioles" and has increased shortness of breath with coughing spells.  She is using her albuterol inhaler more frequently, twice daily, in between her nebulizer treatments that she is taking twice daily.  She stated that her nose drops have helped her runny nose.  She has drainage in the back of her throat.  She said these coughing spells last 4-5 days with brief periods of relief.  She was transferred to [facility] for scheduling options.   Reason for Disposition  [1] Continuous (nonstop) coughing interferes with work or school AND [2] no improvement using cough treatment per Care Advice  Answer Assessment - Initial Assessment Questions 1. ONSET: "When did the cough begin?"      October - cough worsened within the past 2 weeks  2. SEVERITY: "How bad is the cough today?"      Constant  3. SPUTUM: "Describe the color of your sputum" (none, dry cough; clear, white, yellow, green)     Cream colored thick sputum 4. HEMOPTYSIS: "Are you coughing up any blood?" If so ask: "How much?" (flecks, streaks, tablespoons, etc.)     None  5. DIFFICULTY BREATHING: "Are you having difficulty breathing?" If Yes, ask: "How bad is it?" (e.g., mild, moderate, severe)    - MILD: No SOB at rest, mild SOB with walking, speaks normally in sentences, can lie down, no retractions, pulse < 100.    - MODERATE: SOB at rest, SOB with minimal exertion and prefers to sit, cannot lie down flat, speaks in phrases, mild retractions, audible wheezing, pulse 100-120.    - SEVERE: Very SOB at rest, speaks in single words, struggling to breathe, sitting hunched  forward, retractions, pulse > 120      Intermittent shortness of breath due to coughing - "tight bronchioles" 6. FEVER: "Do you have a fever?" If Yes, ask: "What is your temperature, how was it measured, and when did it start?"     No  8. LUNG HISTORY: "Do you have any history of lung disease?"  (e.g., pulmonary embolus, asthma, emphysema)     Asthma, non "tuberculosis bacterial infection" 10. OTHER SYMPTOMS: "Do you have any other symptoms?" (e.g., runny nose, wheezing, chest pain)       Drainage in the back of throat  Chills - can't get warm  Runny nose - nose drops have helped  Protocols used: Cough - Acute Productive-A-AH

## 2023-06-09 NOTE — Telephone Encounter (Signed)
 Patient is scheduled for 3/20. Please advise if you would like to see her sooner.

## 2023-06-09 NOTE — Addendum Note (Signed)
 Addended by: Janann Colonel on: 06/09/2023 01:18 PM   Modules accepted: Orders

## 2023-06-11 ENCOUNTER — Other Ambulatory Visit: Payer: Self-pay | Admitting: Internal Medicine

## 2023-06-11 ENCOUNTER — Ambulatory Visit
Admission: RE | Admit: 2023-06-11 | Discharge: 2023-06-11 | Disposition: A | Discharge status: Home / Self Care | Source: Ambulatory Visit | Attending: Pulmonary Disease | Admitting: Pulmonary Disease

## 2023-06-11 ENCOUNTER — Encounter: Payer: Self-pay | Admitting: Pulmonary Disease

## 2023-06-11 DIAGNOSIS — R0602 Shortness of breath: Secondary | ICD-10-CM | POA: Diagnosis not present

## 2023-06-11 DIAGNOSIS — R059 Cough, unspecified: Secondary | ICD-10-CM | POA: Diagnosis not present

## 2023-06-11 DIAGNOSIS — J189 Pneumonia, unspecified organism: Secondary | ICD-10-CM

## 2023-06-11 MED ORDER — GUAIFENESIN-CODEINE 100-10 MG/5ML PO SOLN: 5.0000 mL | Freq: Every evening | ORAL | 0 refills | Status: DC | PRN

## 2023-06-11 MED ORDER — PREDNISONE 20 MG PO TABS: 40.0000 mg | ORAL_TABLET | Freq: Every day | ORAL | 0 refills | Status: AC

## 2023-06-11 MED ORDER — DOXYCYCLINE HYCLATE 100 MG PO TABS: 100.0000 mg | ORAL_TABLET | Freq: Two times a day (BID) | ORAL | 0 refills | Status: AC

## 2023-06-11 MED ORDER — AZITHROMYCIN 500 MG PO TABS: 500.0000 mg | ORAL_TABLET | Freq: Every day | ORAL | 0 refills | Status: DC

## 2023-06-11 NOTE — Telephone Encounter (Addendum)
 Prednisone Rx not received by pharmacy as Rx was ordered as 'phone in'. Rx has bene corrected and reordered.

## 2023-06-11 NOTE — Telephone Encounter (Signed)
 Last Fill: 04/24/23  Last OV: 04/25/23 Next OV: 10/08/23 AWV   Routing to provider for review/authorization.

## 2023-06-11 NOTE — Addendum Note (Signed)
 Addended by: Lajoyce Lauber A on: 06/11/2023 02:29 PM   Modules accepted: Orders

## 2023-06-11 NOTE — Telephone Encounter (Unsigned)
 Copied from CRM [id number]. Topic: Clinical - Medication Refill >> Jun 11, 2023  3:19 PM Isabell A wrote: Most Recent Primary Care Visit:  Provider: Birdie Sons ERIC G  Department: [facility]  Visit Type: OFFICE VISIT  Date: 04/25/2023  Medication: guaiFENesin-codeine (VIRTUSSIN A/C) 100-10 MG/5ML syrup  Has the patient contacted their pharmacy? Yes (Agent: If no, request that the patient contact the pharmacy for the refill. If patient does not wish to contact the pharmacy document the reason why and proceed with request.) (Agent: If yes, when and what did the pharmacy advise?)  Is this the correct pharmacy for this prescription? Yes If no, delete pharmacy and type the correct one.  This is the patient's preferred pharmacy:  [id number] - [place], [place] - [address] AT [facility] [address] [place] [address] Phone: [phone] Fax: [phone]   Has the prescription been filled recently? Yes  Is the patient out of the medication? Yes  Has the patient been seen for an appointment in the last year OR does the patient have an upcoming appointment? Yes  Can we respond through MyChart? No  Agent: Please be advised that Rx refills may take up to 3 business days. We ask that you follow-up with your pharmacy.

## 2023-06-11 NOTE — Addendum Note (Signed)
 Addended by: Janann Colonel on: 06/11/2023 05:25 PM   Modules accepted: Orders

## 2023-06-11 NOTE — Addendum Note (Signed)
 Addended by: Janann Colonel on: 06/11/2023 05:44 PM   Modules accepted: Orders

## 2023-06-12 ENCOUNTER — Encounter: Payer: Self-pay | Admitting: Internal Medicine

## 2023-06-12 ENCOUNTER — Ambulatory Visit: Admitting: Internal Medicine

## 2023-06-12 VITALS — BP 108/70 | HR 83 | Temp 98.4°F | Ht 63.0 in | Wt 144.0 lb

## 2023-06-12 DIAGNOSIS — J22 Unspecified acute lower respiratory infection: Secondary | ICD-10-CM | POA: Diagnosis not present

## 2023-06-12 LAB — POCT INFLUENZA A/B
Influenza A, POC: NEGATIVE
Influenza B, POC: NEGATIVE

## 2023-06-12 LAB — POC COVID19 BINAXNOW: SARS Coronavirus 2 Ag: NEGATIVE

## 2023-06-12 NOTE — Assessment & Plan Note (Addendum)
 Has had recurrent spells for some time Recent CT suggests MAI---clinical history also goes along with bronchiectasis--but not seen on CT (may need bronchoscopy) CXR yesterday not read but seems to show just RML atelectasis or scar---?slightly more prominent (but no lobar pneumonia) Already better with 1 day of the doxy/prednisone---has azithromycin in case the doxy doesn't work Flu and COVID tests negative here

## 2023-06-12 NOTE — Addendum Note (Signed)
 Addended by: Eual Fines on: 06/12/2023 12:26 PM   Modules accepted: Orders

## 2023-06-12 NOTE — Progress Notes (Signed)
 Subjective:    Patient ID: Alison Bradshaw, female    DOB: [birth year], 83 y.o.   MRN: [id number]  HPI Here due to respiratory infection  Long history of asthma---seeing Dr Larinda Buttery for some months Has syndrome of symptoms, abate and then recur This restarted a few days ago--hoarse, head congestion--then into the lungs Felt achy, chilled, bad cough 3 days ago Bringing up creamy tan sputum Low grade fever  Taking symbicort, atrovent and flonase Started on prednisone, doxycycline by Dr Larinda Buttery (had azithromycin to try if this doesn't help) Already feels some better---less constriction feeling and hacking cough  Current Outpatient Medications on File Prior to Visit  Medication Sig Dispense Refill   acetaminophen (TYLENOL) 500 MG tablet Take 1,000 mg by mouth every 6 (six) hours as needed.     albuterol (VENTOLIN HFA) 108 (90 Base) MCG/ACT inhaler Inhale 2 puffs into the lungs every 6 (six) hours as needed for shortness of breath. 3 each 0   ALPRAZolam (XANAX) 0.25 MG tablet Take 1 tablet (0.25 mg total) by mouth at bedtime as needed for anxiety. 30 tablet 0   benzonatate (TESSALON) 100 MG capsule Take 1 capsule (100 mg total) by mouth 3 (three) times daily as needed for cough. 21 capsule 0   budesonide-formoterol (SYMBICORT) 160-4.5 MCG/ACT inhaler Inhale 2 puffs into the lungs in the morning and at bedtime. 1 each 12   doxycycline (VIBRA-TABS) 100 MG tablet Take 1 tablet (100 mg total) by mouth 2 (two) times daily for 5 days. 10 tablet 0   fluticasone (FLONASE) 50 MCG/ACT nasal spray Place 1 spray into both nostrils in the morning and at bedtime. 100 mL 2   guaiFENesin-codeine (VIRTUSSIN A/C) 100-10 MG/5ML syrup Take 5 mLs by mouth at bedtime and may repeat dose one time if needed. 150 mL 0   ipratropium (ATROVENT) 0.03 % nasal spray Place 2 sprays into both nostrils every 12 (twelve) hours.     levalbuterol (XOPENEX) 0.63 MG/3ML nebulizer solution 1.25 mg every 6-8 hours as needed 3 mL  12   pantoprazole (PROTONIX) 40 MG tablet Take 1 tablet (40 mg total) by mouth daily. 90 tablet 3   predniSONE (DELTASONE) 20 MG tablet Take 2 tablets (40 mg total) by mouth daily with breakfast for 5 days. 10 tablet 0   rosuvastatin (CRESTOR) 10 MG tablet TAKE 1 TABLET(10 MG) BY MOUTH DAILY 90 tablet 1   Spacer/Aero-Holding Chambers (AEROCHAMBER MV) inhaler Use as instructed 1 each 0   Spacer/Aero-Holding Chambers (AEROCHAMBER MV) inhaler Use as instructed 1 each 0   sucralfate (CARAFATE) 1 G tablet Take 1 g by mouth as needed. Pt only takes when she's really needs it which isn't very often     No current facility-administered medications on file prior to visit.    Allergies  Allergen Reactions   Percocet [Oxycodone-Acetaminophen] Nausea Only   Ceftin [Cefuroxime Axetil] Hives and Rash   Timentin [Ticarcillin-Pot Clavulanate] Hives and Rash    Past Medical History:  Diagnosis Date   Anxiety    Asthma    Atypical chest pain    Barrett's esophagus    Chronic gastritis    Diverticulosis    Duodenal ulcer    Gastritis    GERD (gastroesophageal reflux disease)    Hiatal hernia    History of echocardiogram    a. 08/2020 Echo: EF 60-65%, no rwma, Nl RV fxn, mild MR.   Hyperlipidemia    Non-obstructive CAD (coronary artery disease)    a. 10/2017  Stress Echo: Nl EF, no ischemia. Mild MR; b. 12/2017 Cardiac CTA: LM nl, LAD 50p, 30d, D1/2 nl, RI nl, LCX nl, OM1/2 nl, RCA nl, RPDA/RPL nl. Ca2+ score = 39 (46th percentile)-->statin dose escalated; c. 10/2021 MV: No ischemia/infarct. Nl LV fxn.   Osteopenia    Peptic ulcer, site unspecified, unspecified as acute or chronic, without hemorrhage or perforation 11/19/2012   Formatting of this note might be different from the original.  duodenum 1966  Formatting of this note might be different from the original.  Overview:   duodenum 1966   PSVT (paroxysmal supraventricular tachycardia) (HCC)    a. 10/2017 Holter: PSVT, max 5 beats->did not tolerate  beta blocker; b. 09/2020 Zio: 38 runs of SVT. Longest 18.1 secs (106), fastest 203 bpm (6 beats). Occas PVCs (1.1%) - some assoc w/ triggered events; C. 11/2022 Zio: Predominantly sinus rhythm @ 69 (48-182).  34 runs of PSVT, longest 12 beats.  3.3% PVC burden.  No sustained arrhythmias or A-fib.   Pulmonary nodules    a. 12/2017 Cardiac CTA: RLL 9mm nodule; b. 04/2018 RLL 9mm nodule resolved; c. 09/2018 CT Chest: Previously noted posterior RUL nodules almost completely resolved. Bandlike scarring of RML and lingula-->atyp infxn; d. 11/2022 CT Chest: Clustered bilateral solid pulmonary nodules and groundglass opacities, most pronounced in posterior RUL, RML, and lingula - ? chronic atyp infxn. New 6mm RLL nodule.   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   BREAST BIOPSY Left 2008   neg   BREAST EXCISIONAL BIOPSY Left 2004   neg   CATARACT EXTRACTION W/ INTRAOCULAR LENS  IMPLANT, BILATERAL Bilateral    COLONOSCOPY WITH PROPOFOL N/A 01/22/2017   Procedure: COLONOSCOPY WITH PROPOFOL;  Surgeon: Toledo, Boykin Nearing, MD;  Location: [facility] ENDOSCOPY;  Service: Gastroenterology;  Laterality: N/A;   COLONOSCOPY WITH PROPOFOL N/A 03/26/2022   Procedure: COLONOSCOPY WITH PROPOFOL;  Surgeon: Regis Bill, MD;  Location: [facility] ENDOSCOPY;  Service: Endoscopy;  Laterality: N/A;   ESOPHAGOGASTRODUODENOSCOPY (EGD) WITH PROPOFOL N/A 01/20/2015   Procedure: ESOPHAGOGASTRODUODENOSCOPY (EGD) WITH PROPOFOL;  Surgeon: Christena Deem, MD;  Location: [facility] ENDOSCOPY;  Service: Endoscopy;  Laterality: N/A;   ESOPHAGOGASTRODUODENOSCOPY (EGD) WITH PROPOFOL N/A 01/22/2017   Procedure:  ESOPHAGOGASTRODUODENOSCOPY (EGD) WITH PROPOFOL;  Surgeon: Toledo, Boykin Nearing, MD;  Location: [facility] ENDOSCOPY;  Service: Gastroenterology;  Laterality: N/A;   ESOPHAGOGASTRODUODENOSCOPY (EGD) WITH PROPOFOL N/A 04/14/2018   Procedure: ESOPHAGOGASTRODUODENOSCOPY (EGD) WITH PROPOFOL;  Surgeon: Christena Deem, MD;  Location: [facility] ENDOSCOPY;  Service: Endoscopy;  Laterality: N/A;   ESOPHAGOGASTRODUODENOSCOPY (EGD) WITH PROPOFOL N/A 10/16/2021   Procedure: ESOPHAGOGASTRODUODENOSCOPY (EGD) WITH PROPOFOL;  Surgeon: Regis Bill, MD;  Location: [facility] ENDOSCOPY;  Service: Endoscopy;  Laterality: N/A;   EYE SURGERY Right    laser scraping   OPEN REDUCTION INTERNAL FIXATION (ORIF) DISTAL RADIAL FRACTURE Right 02/09/2018   Procedure: OPEN REDUCTION INTERNAL FIXATION (ORIF) DISTAL RADIAL FRACTURE;  Surgeon: Kennedy Bucker, MD;  Location: [facility] ORS;  Service: Orthopedics;  Laterality: Right;   TONSILLECTOMY      Family History  Problem Relation Age of Onset   Hyperlipidemia Mother    Alzheimer's disease Mother  Emphysema Father    AAA (abdominal aortic aneurysm) Father    Kidney Stones Father    Kidney cancer Brother    Pancreatic cancer Paternal Uncle    Diabetes Paternal Uncle    Stomach cancer Neg Hx    Colon cancer Neg Hx    Esophageal cancer Neg Hx     Social History   Socioeconomic History   Marital status: Widowed    Spouse name: Not on file   Number of children: 2   Years of education: Not on file   Highest education level: Not on file  Occupational History   Occupation: retired  Tobacco Use   Smoking status: Former    Types: Cigarettes    Passive exposure: Past   Smokeless tobacco: Never   Tobacco comments:    Quit at age 29, smoked 1 pack a month   Vaping Use   Vaping status: Never Used  Substance and Sexual Activity   Alcohol use: Yes    Alcohol/week: 2.0 standard drinks of alcohol    Types: 2 Glasses of wine per week    Comment: weekly,none last 24 hrs   Drug  use: No   Sexual activity: Not Currently    Partners: Male  Other Topics Concern   Not on file  Social History Narrative   no drug use no alcohol   Social Drivers of Corporate investment banker Strain: Low Risk  (02/05/2023)   Received from [facility]   Overall Financial Resource Strain (CARDIA)    Difficulty of Paying Living Expenses: Not hard at all  Food Insecurity: No Food Insecurity (02/05/2023)   Received from [facility]   Hunger Vital Sign    Worried About Running Out of Food in the Last Year: Never true    Ran Out of Food in the Last Year: Never true  Transportation Needs: No Transportation Needs (02/05/2023)   Received [facility] - Transportation    In the past 12 months, has lack of transportation kept you from medical appointments or from getting medications?: No    Lack of Transportation (Non-Medical): No  Physical Activity: Sufficiently Active (10/07/2022)   Exercise Vital Sign    Days of Exercise per Week: 7 days    Minutes of Exercise per Session: 90 min  Stress: No Stress Concern Present (10/07/2022)   [employer] of Occupational Health - Occupational Stress Questionnaire    Feeling of Stress : Not at all  Social Connections: Moderately Integrated (10/07/2022)   Social Connection and Isolation Panel [NHANES]    Frequency of Communication with Friends and Family: More than three times a week    Frequency of Social Gatherings with Friends and Family: More than three times a week    Attends Religious Services: More than 4 times per year    Active Member of [employer] or Organizations: Yes    Attends Banker Meetings: More than 4 times per year    Marital Status: Widowed  Intimate Partner Violence: Not At Risk (10/07/2022)   Humiliation, Afraid, Rape, and Kick questionnaire    Fear of Current or Ex-Partner: No    Emotionally Abused: No    Physically Abused: No    Sexually Abused: No    Review of Systems Some nausea---but no vomiting Appetite better today    Objective:   Physical Exam Constitutional:      General: She is not in acute distress.  Comments: Some coarse cough  HENT:     Right Ear: Tympanic membrane and ear canal normal.     Left Ear: Tympanic membrane and ear canal normal.     Mouth/Throat:     Pharynx: No oropharyngeal exudate or posterior oropharyngeal erythema.  Pulmonary:     Effort: Pulmonary effort is normal.     Breath sounds: Normal breath sounds. No wheezing or rales.  Musculoskeletal:     Cervical back: Neck supple.  Lymphadenopathy:     Cervical: No cervical adenopathy.  Neurological:     Mental Status: She is alert.            Assessment & Plan:

## 2023-06-13 ENCOUNTER — Ambulatory Visit: Admitting: Nurse Practitioner

## 2023-06-13 DIAGNOSIS — M545 Low back pain, unspecified: Secondary | ICD-10-CM | POA: Diagnosis not present

## 2023-06-13 NOTE — Telephone Encounter (Unsigned)
 Copied from CRM [id number]. Topic: Clinical - Lab/Test Results >> Jun 13, 2023 12:54 PM Theodis Sato wrote: Reason for CRM: Patient is requesting a phone call back from Dr. Ceasar Lund nurse when her Xray results from 3/5 are in.

## 2023-06-13 NOTE — Telephone Encounter (Signed)
 Lvm notifying pt she would need to reach to pulmonary in regards to xray due to them placing the order and not Dr. Darrick Huntsman. Okay to relay message. If message is relayed please notify office

## 2023-06-15 DIAGNOSIS — J45998 Other asthma: Secondary | ICD-10-CM | POA: Diagnosis not present

## 2023-06-15 DIAGNOSIS — J45909 Unspecified asthma, uncomplicated: Secondary | ICD-10-CM | POA: Diagnosis not present

## 2023-06-19 ENCOUNTER — Encounter: Payer: Self-pay | Admitting: Student in an Organized Health Care Education/Training Program

## 2023-06-19 ENCOUNTER — Ambulatory Visit: Admitting: Student in an Organized Health Care Education/Training Program

## 2023-06-19 VITALS — BP 116/62 | HR 65 | Temp 98.0°F | Ht 63.0 in | Wt 141.8 lb

## 2023-06-19 DIAGNOSIS — R053 Chronic cough: Secondary | ICD-10-CM

## 2023-06-19 DIAGNOSIS — R0602 Shortness of breath: Secondary | ICD-10-CM

## 2023-06-19 LAB — NITRIC OXIDE: Nitric Oxide: 11

## 2023-06-19 MED ORDER — ALBUTEROL SULFATE (2.5 MG/3ML) 0.083% IN NEBU: 2.5000 mg | INHALATION_SOLUTION | Freq: Two times a day (BID) | RESPIRATORY_TRACT | 11 refills | Status: AC

## 2023-06-19 MED ORDER — SODIUM CHLORIDE 3 % IN NEBU: INHALATION_SOLUTION | Freq: Two times a day (BID) | RESPIRATORY_TRACT | 12 refills | Status: AC

## 2023-06-19 NOTE — Patient Instructions (Signed)
We will start a pulmonary clearance regimen that will include the following:  -Start with using the hypertonic saline by nebulizer -Then follow the albuterol by nebulizer -Then use the flutter device: please do at least 10-15 blows into the device for at least 2 to 3 seconds each -And finish up with using the incentive spirometer; use for at least 10 times -After doing these, attempt to cough.  Please do these twice a day
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
## 2023-06-25 ENCOUNTER — Other Ambulatory Visit: Payer: Self-pay | Admitting: Internal Medicine

## 2023-06-25 MED ORDER — ALPRAZOLAM 0.25 MG PO TABS: 0.2500 mg | ORAL_TABLET | Freq: Every evening | ORAL | 2 refills | Status: DC | PRN

## 2023-06-25 NOTE — Telephone Encounter (Signed)
 Copied from CRM [id number]. Topic: Clinical - Medication Refill >> Jun 25, 2023  9:16 AM Elizebeth Brooking wrote: Most Recent Primary Care Visit:  Provider: Tillman Abide I  Department: [facility]  Visit Type: ACUTE  Date: 06/12/2023  Medication: ALPRAZolam (XANAX) 0.25 MG tablet   Has the patient contacted their pharmacy? Yes (Agent: If no, request that the patient contact the pharmacy for the refill. If patient does not wish to contact the pharmacy document the reason why and proceed with request.) (Agent: If yes, when and what did the pharmacy advise?)  Is this the correct pharmacy for this prescription? Yes If no, delete pharmacy and type the correct one.  This is the patient's preferred pharmacy:  [id number] - [place], [place] - [address] AT [facility] [address] [facility] [facility] [facility] [place] [address] Phone: [phone] Fax: [phone]   Has the prescription been filled recently? No  Is the patient out of the medication? Yes  Has the patient been seen for an appointment in the last year OR does the patient have an upcoming appointment? Yes  Can we respond through MyChart? Yes  Agent: Please be advised that Rx refills may take up to 3 business days. We ask that you follow-up with your pharmacy.

## 2023-06-26 ENCOUNTER — Ambulatory Visit: Admitting: Pulmonary Disease

## 2023-06-30 ENCOUNTER — Other Ambulatory Visit
Admission: RE | Admit: 2023-06-30 | Discharge: 2023-06-30 | Disposition: A | Discharge status: Home / Self Care | Attending: Student in an Organized Health Care Education/Training Program | Admitting: Student in an Organized Health Care Education/Training Program

## 2023-06-30 DIAGNOSIS — R053 Chronic cough: Secondary | ICD-10-CM | POA: Insufficient documentation

## 2023-07-01 ENCOUNTER — Other Ambulatory Visit
Admission: RE | Admit: 2023-07-01 | Discharge: 2023-07-01 | Disposition: A | Discharge status: Home / Self Care | Attending: Student in an Organized Health Care Education/Training Program | Admitting: Student in an Organized Health Care Education/Training Program

## 2023-07-01 DIAGNOSIS — R053 Chronic cough: Secondary | ICD-10-CM | POA: Insufficient documentation

## 2023-07-01 LAB — ACID FAST SMEAR (AFB, MYCOBACTERIA): Acid Fast Smear: NEGATIVE

## 2023-07-02 ENCOUNTER — Other Ambulatory Visit
Admission: RE | Admit: 2023-07-02 | Discharge: 2023-07-02 | Disposition: A | Discharge status: Home / Self Care | Attending: Student in an Organized Health Care Education/Training Program | Admitting: Student in an Organized Health Care Education/Training Program

## 2023-07-02 DIAGNOSIS — R053 Chronic cough: Secondary | ICD-10-CM | POA: Insufficient documentation

## 2023-07-02 LAB — CULTURE, RESPIRATORY W GRAM STAIN

## 2023-07-03 ENCOUNTER — Ambulatory Visit: Payer: Medicare PPO | Admitting: Pulmonary Disease

## 2023-07-03 LAB — CULTURE, RESPIRATORY W GRAM STAIN

## 2023-07-04 LAB — ACID FAST SMEAR (AFB, MYCOBACTERIA): Acid Fast Smear: NEGATIVE

## 2023-07-05 LAB — CULTURE, RESPIRATORY W GRAM STAIN
Culture: NORMAL
Gram Stain: NONE SEEN

## 2023-07-07 ENCOUNTER — Encounter: Payer: Self-pay | Admitting: Pulmonary Disease

## 2023-07-10 ENCOUNTER — Encounter: Payer: Self-pay | Admitting: Nurse Practitioner

## 2023-07-10 ENCOUNTER — Ambulatory Visit: Admitting: Nurse Practitioner

## 2023-07-10 VITALS — BP 112/66 | HR 61 | Temp 98.4°F | Ht 63.0 in | Wt 144.6 lb

## 2023-07-10 DIAGNOSIS — M542 Cervicalgia: Secondary | ICD-10-CM | POA: Diagnosis not present

## 2023-07-10 MED ORDER — CYCLOBENZAPRINE HCL 5 MG PO TABS: 5.0000 mg | ORAL_TABLET | Freq: Every day | ORAL | 1 refills | Status: DC

## 2023-07-10 NOTE — Progress Notes (Signed)
 Established Patient Office Visit  Subjective:  Patient ID: Alison Bradshaw, female    DOB: [birth year]  Age: 83 y.o. MRN: [id number]  CC:  Chief Complaint  Patient presents with   Acute Visit    Neck & left shoulder pain x 1 week Radiating sharp ache about half way to left elbow Using Ice & Heat   Discussed the use of a AI scribe software for clinical note transcription with the patient, who gave verbal consent to proceed.  HPI  Alison Bradshaw is an 83 year old female who presents with left-sided neck and shoulder pain.  She experiences stiffness and soreness on the left side of her spine, particularly around the top of the shoulder, radiating down the top of the arm. The pain is described as muscular and began about a week ago.  It radiates from a specific point and spreads out, affecting her ability to sleep as it keeps her awake at night. The pain is exacerbated by moving her head, and she experiences a stiff neck. No previous episodes of neck pain or stiffness.  The pain started after engaging in activities such as gardening, shoveling, and a three-mile hike. She recalls lifting a heavy sack of topsoil during gardening, which may have contributed to the onset of symptoms. No recent heavy lifting aside from these activities.  She has been using warm moist heat, ice packs, and Tylenol for pain relief, which she tolerates well, but reports that the pain has worsened rather than improved. She has used muscle relaxants like Flexeril in the past without adverse effects  HPI   Past Medical History:  Diagnosis Date   Anxiety    Asthma    Atypical chest pain    Barrett's esophagus    Chronic gastritis    Diverticulosis    Duodenal ulcer    Gastritis    GERD (gastroesophageal reflux disease)    Hiatal hernia    History of echocardiogram    a. 08/2020 Echo: EF 60-65%, no rwma, Nl RV fxn, mild MR.   Hyperlipidemia    Non-obstructive CAD (coronary artery disease)    a.  10/2017 Stress Echo: Nl EF, no ischemia. Mild MR; b. 12/2017 Cardiac CTA: LM nl, LAD 50p, 30d, D1/2 nl, RI nl, LCX nl, OM1/2 nl, RCA nl, RPDA/RPL nl. Ca2+ score = 39 (46th percentile)-->statin dose escalated; c. 10/2021 MV: No ischemia/infarct. Nl LV fxn.   Osteopenia    Peptic ulcer, site unspecified, unspecified as acute or chronic, without hemorrhage or perforation 11/19/2012   Formatting of this note might be different from the original.  duodenum 1968  Formatting of this note might be different from the original.  Overview:   duodenum 1968   PSVT (paroxysmal supraventricular tachycardia) (HCC)    a. 10/2017 Holter: PSVT, max 5 beats->did not tolerate beta blocker; b. 09/2020 Zio: 38 runs of SVT. Longest 18.1 secs (106), fastest 203 bpm (6 beats). Occas PVCs (1.1%) - some assoc w/ triggered events; C. 11/2022 Zio: Predominantly sinus rhythm @ 69 (48-182).  34 runs of PSVT, longest 12 beats.  3.3% PVC burden.  No sustained arrhythmias or A-fib.   Pulmonary nodules    a. 12/2017 Cardiac CTA: RLL 9mm nodule; b. 04/2018 RLL 9mm nodule resolved; c. 09/2018 CT Chest: Previously noted posterior RUL nodules almost completely resolved. Bandlike scarring of RML and lingula-->atyp infxn; d. 11/2022 CT Chest: Clustered bilateral solid pulmonary nodules and groundglass opacities, most pronounced in posterior RUL, RML, and lingula - ?  chronic atyp infxn. New 6mm RLL nodule.   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   BREAST BIOPSY Left 2008   neg   BREAST EXCISIONAL BIOPSY Left 2004   neg   CATARACT EXTRACTION W/ INTRAOCULAR LENS  IMPLANT, BILATERAL Bilateral    COLONOSCOPY WITH  PROPOFOL N/A 01/22/2017   Procedure: COLONOSCOPY WITH PROPOFOL;  Surgeon: Toledo, Alphonsus Jeans, MD;  Location: [facility] ENDOSCOPY;  Service: Gastroenterology;  Laterality: N/A;   COLONOSCOPY WITH PROPOFOL N/A 03/26/2022   Procedure: COLONOSCOPY WITH PROPOFOL;  Surgeon: Shane Darling, MD;  Location: [facility] ENDOSCOPY;  Service: Endoscopy;  Laterality: N/A;   ESOPHAGOGASTRODUODENOSCOPY (EGD) WITH PROPOFOL N/A 01/20/2015   Procedure: ESOPHAGOGASTRODUODENOSCOPY (EGD) WITH PROPOFOL;  Surgeon: Deveron Fly, MD;  Location[facility] ENDOSCOPY;  Service: Endoscopy;  Laterality: N/A;   ESOPHAGOGASTRODUODENOSCOPY (EGD) WITH PROPOFOL N/A 01/22/2017   Procedure: ESOPHAGOGASTRODUODENOSCOPY (EGD) WITH PROPOFOL;  Surgeon: Toledo, Alphonsus Jeans, MD;  Location: [facility] ENDOSCOPY;  Service: Gastroenterology;  Laterality: N/A;   ESOPHAGOGASTRODUODENOSCOPY (EGD) WITH PROPOFOL N/A 04/14/2018   Procedure: ESOPHAGOGASTRODUODENOSCOPY (EGD) WITH PROPOFOL;  Surgeon: Deveron Fly, MD;  Location: [facility] ENDOSCOPY;  Service: Endoscopy;  Laterality: N/A;   ESOPHAGOGASTRODUODENOSCOPY (EGD) WITH PROPOFOL N/A 10/16/2021   Procedure: ESOPHAGOGASTRODUODENOSCOPY (EGD) WITH PROPOFOL;  Surgeon: Shane Darling, MD;  Location: [facility] ENDOSCOPY;  Service: Endoscopy;  Laterality: N/A;   EYE SURGERY Right    laser scraping   OPEN REDUCTION INTERNAL FIXATION (ORIF) DISTAL RADIAL FRACTURE Right 02/09/2018   Procedure: OPEN REDUCTION INTERNAL FIXATION (ORIF) DISTAL RADIAL FRACTURE;  Surgeon: Molli Angelucci, MD;  Location: [facility] ORS;  Service: Orthopedics;  Laterality: Right;   TONSILLECTOMY      Family History  Problem Relation Age of Onset   Hyperlipidemia Mother    Alzheimer's disease Mother    Emphysema Father    AAA (abdominal aortic aneurysm) Father    Kidney Stones Father    Kidney cancer Brother    Pancreatic cancer Paternal Uncle    Diabetes Paternal Uncle    Stomach cancer Neg Hx    Colon cancer Neg Hx    Esophageal cancer Neg  Hx     Social History   Socioeconomic History   Marital status: Widowed    Spouse name: Not on file   Number of children: 2   Years of education: Not on file   Highest education level: Not on file  Occupational History   Occupation: retired  Tobacco Use   Smoking status: Former    Types: Cigarettes    Passive exposure: Past   Smokeless tobacco: Never   Tobacco comments:    Quit at age 19, smoked 1 pack a month   Vaping Use   Vaping status: Never Used  Substance and Sexual Activity   Alcohol use: Yes    Alcohol/week: 2.0 standard drinks of alcohol    Types: 2 Glasses of wine per week    Comment: weekly,none last 24 hrs   Drug use: No   Sexual activity: Not Currently    Partners: Male  Other Topics Concern   Not on file  Social History Narrative   no drug use  no alcohol   Social Drivers of Corporate investment banker Strain: Low Risk  (02/05/2023)   Received from [facility]   Overall Financial Resource Strain (CARDIA)    Difficulty of Paying Living Expenses: Not hard at all  Food Insecurity: No Food Insecurity (02/05/2023)   Received from [facility]   Hunger Vital Sign    Worried About Running Out of Food in the Last Year: Never true    Ran Out of Food in the Last Year: Never true  Transportation Needs: No Transportation Needs (02/05/2023)   Received from [facility] - Transportation    In the past 12 months, has lack of transportation kept you from medical appointments or from getting medications?: No    Lack of Transportation (Non-Medical): No  Physical Activity: Sufficiently Active (10/07/2022)   Exercise Vital Sign    Days of Exercise per Week: 7 days    Minutes of Exercise per Session: 90 min  Stress: No Stress Concern Present (10/07/2022)   [employer] of Occupational Health - Occupational Stress Questionnaire    Feeling of Stress : Not at all  Social Connections: Moderately Integrated  (10/07/2022)   Social Connection and Isolation Panel [NHANES]    Frequency of Communication with Friends and Family: More than three times a week    Frequency of Social Gatherings with Friends and Family: More than three times a week    Attends Religious Services: More than 4 times per year    Active Member of [employer] or Organizations: Yes    Attends Banker Meetings: More than 4 times per year    Marital Status: Widowed  Intimate Partner Violence: Not At Risk (10/07/2022)   Humiliation, Afraid, Rape, and Kick questionnaire    Fear of Current or Ex-Partner: No    Emotionally Abused: No    Physically Abused: No    Sexually Abused: No     Outpatient Medications Prior to Visit  Medication Sig Dispense Refill   acetaminophen (TYLENOL) 500 MG tablet Take 1,000 mg by mouth every 6 (six) hours as needed.     albuterol (PROVENTIL) (2.5 MG/3ML) 0.083% nebulizer solution Take 3 mLs (2.5 mg total) by nebulization in the morning and at bedtime. 75 mL 11   albuterol (VENTOLIN HFA) 108 (90 Base) MCG/ACT inhaler Inhale 2 puffs into the lungs every 6 (six) hours as needed for shortness of breath. 3 each 0   ALPRAZolam (XANAX) 0.25 MG tablet Take 1 tablet (0.25 mg total) by mouth at bedtime as needed for anxiety. 30 tablet 2   benzonatate (TESSALON) 100 MG capsule Take 1 capsule (100 mg total) by mouth 3 (three) times daily as needed for cough. 21 capsule 0   budesonide-formoterol (SYMBICORT) 160-4.5 MCG/ACT inhaler Inhale 2 puffs into the lungs in the morning and at bedtime. 1 each 12   fluticasone (FLONASE) 50 MCG/ACT nasal spray Place 1 spray into both nostrils in the morning and at bedtime. 100 mL 2   guaiFENesin-codeine (VIRTUSSIN A/C) 100-10 MG/5ML syrup Take 5 mLs by mouth at bedtime and may repeat dose one time if needed. 150 mL 0   ipratropium (ATROVENT) 0.03 % nasal spray Place 2 sprays into both nostrils every 12 (twelve) hours.     pantoprazole (PROTONIX) 40 MG tablet Take 1 tablet (40  mg total) by mouth daily. 90 tablet 3   rosuvastatin (CRESTOR) 10 MG tablet TAKE 1 TABLET(10 MG) BY MOUTH DAILY 90 tablet  1   sodium chloride HYPERTONIC 3 % nebulizer solution Take by nebulization in the morning and at bedtime. 750 mL 12   Spacer/Aero-Holding Chambers (AEROCHAMBER MV) inhaler Use as instructed 1 each 0   sucralfate (CARAFATE) 1 G tablet Take 1 g by mouth as needed. Pt only takes when she's really needs it which isn't very often     No facility-administered medications prior to visit.    Allergies  Allergen Reactions   Percocet [Oxycodone-Acetaminophen] Nausea Only   Ceftin [Cefuroxime Axetil] Hives and Rash   Timentin [Ticarcillin-Pot Clavulanate] Hives and Rash    ROS Review of Systems Negative unless indicated in HPI.    Objective:    Physical Exam Constitutional:      Appearance: Normal appearance.  Cardiovascular:     Rate and Rhythm: Normal rate and regular rhythm.     Pulses: Normal pulses.     Heart sounds: Normal heart sounds.  Pulmonary:     Effort: Pulmonary effort is normal.     Breath sounds: Normal breath sounds.  Musculoskeletal:        General: Tenderness (trapezius muscle and shoulder) present.     Cervical back: Normal range of motion.  Neurological:     General: No focal deficit present.     Mental Status: She is alert. Mental status is at baseline.  Psychiatric:        Mood and Affect: Mood normal.        Behavior: Behavior normal.        Thought Content: Thought content normal.        Judgment: Judgment normal.     BP 112/66   Pulse 61   Temp 98.4 F (36.9 C)   Ht 5\' 3"  (1.6 m)   Wt 144 lb 9.6 oz (65.6 kg)   SpO2 97%   BMI 25.61 kg/m  Wt Readings from Last 3 Encounters:  07/10/23 144 lb 9.6 oz (65.6 kg)  06/19/23 141 lb 12.8 oz (64.3 kg)  06/12/23 144 lb (65.3 kg)     Health Maintenance  Topic Date Due   DTaP/Tdap/Td (2 - Td or Tdap) 04/08/2022   Pneumonia Vaccine 54+ Years old (2 of 2 - PPSV23) 11/27/2023  (Originally 07/05/2016)   Medicare Annual Wellness (AWV)  10/07/2023   INFLUENZA VACCINE  11/07/2023   DEXA SCAN  Completed   Zoster Vaccines- Shingrix  Completed   HPV VACCINES  Aged Out   Meningococcal B Vaccine  Aged Out   Colonoscopy  Discontinued   COVID-19 Vaccine  Discontinued    There are no preventive care reminders to display for this patient.  Lab Results  Component Value Date   TSH 1.75 11/07/2022   Lab Results  Component Value Date   WBC 6.6 03/18/2023   HGB 13.4 03/18/2023   HCT 39.6 03/18/2023   MCV 90.8 03/18/2023   PLT 184 03/18/2023   Lab Results  Component Value Date   NA 137 03/18/2023   K 3.7 03/18/2023   CO2 28 03/18/2023   GLUCOSE 100 (H) 03/18/2023   BUN 11 03/18/2023   CREATININE 0.74 03/18/2023   BILITOT 1.0 03/18/2023   ALKPHOS 72 03/18/2023   AST 29 03/18/2023   ALT 22 03/18/2023   PROT 7.1 03/18/2023   ALBUMIN 4.2 03/18/2023   CALCIUM 9.1 03/18/2023   ANIONGAP 8 03/18/2023   GFR 63.07 11/07/2022   Lab Results  Component Value Date   CHOL 164 08/27/2022   Lab Results  Component Value Date  HDL 68.00 08/27/2022   Lab Results  Component Value Date   LDLCALC 85 08/27/2022   Lab Results  Component Value Date   TRIG 56.0 08/27/2022   Lab Results  Component Value Date   CHOLHDL 2 08/27/2022   No results found for: "HGBA1C"    Assessment & Plan:  Neck pain Assessment & Plan: Stiffness and soreness on the left side of the neck, radiating to the left shoulder and arm, likely due to recent physical activities. Suspected muscular pain and inflammation, possibly involving the trapezius muscle. - Prescribed Flexeril at bedtime for muscle relaxation. - Consider short course of prednisone if inflammation suspected, use cautiously. - Advised Tylenol for pain relief and hot packs for comfort. - Suggested gentle shoulder exercises. - Recommended topical treatments like hot creams or Voltaren gel. - Referred to [facility] Physical  Therapy if symptoms persist. - Instructed to report via MyChart if no improvement with Flexeril,  Orders: -     Ambulatory referral to Physical Therapy  Other orders -     Cyclobenzaprine HCl; Take 1 tablet (5 mg total) by mouth at bedtime.  Dispense: 14 tablet; Refill: 1    Follow-up: No follow-ups on file.   Preslie Depasquale, NP

## 2023-07-16 DIAGNOSIS — J45909 Unspecified asthma, uncomplicated: Secondary | ICD-10-CM | POA: Diagnosis not present

## 2023-07-16 DIAGNOSIS — J45998 Other asthma: Secondary | ICD-10-CM | POA: Diagnosis not present

## 2023-07-21 DIAGNOSIS — M542 Cervicalgia: Secondary | ICD-10-CM | POA: Insufficient documentation

## 2023-07-21 NOTE — Addendum Note (Signed)
 Addended by: Annitta Kindler on: 07/21/2023 04:35 PM   Modules accepted: Orders

## 2023-07-21 NOTE — Assessment & Plan Note (Signed)
 Stiffness and soreness on the left side of the neck, radiating to the left shoulder and arm, likely due to recent physical activities. Suspected muscular pain and inflammation, possibly involving the trapezius muscle. - Prescribed Flexeril at bedtime for muscle relaxation. - Consider short course of prednisone if inflammation suspected, use cautiously. - Advised Tylenol for pain relief and hot packs for comfort. - Suggested gentle shoulder exercises. - Recommended topical treatments like hot creams or Voltaren gel. - Referred to [facility] Physical Therapy if symptoms persist. - Instructed to report via MyChart if no improvement with Flexeril,

## 2023-07-29 DIAGNOSIS — M542 Cervicalgia: Secondary | ICD-10-CM | POA: Diagnosis not present

## 2023-07-31 LAB — FUNGUS CULTURE WITH STAIN

## 2023-07-31 LAB — FUNGAL ORGANISM REFLEX

## 2023-07-31 LAB — FUNGUS CULTURE RESULT

## 2023-08-01 DIAGNOSIS — H35371 Puckering of macula, right eye: Secondary | ICD-10-CM | POA: Diagnosis not present

## 2023-08-01 DIAGNOSIS — Z0100 Encounter for examination of eyes and vision without abnormal findings: Secondary | ICD-10-CM | POA: Diagnosis not present

## 2023-08-01 DIAGNOSIS — M3501 Sicca syndrome with keratoconjunctivitis: Secondary | ICD-10-CM | POA: Diagnosis not present

## 2023-08-01 DIAGNOSIS — H43813 Vitreous degeneration, bilateral: Secondary | ICD-10-CM | POA: Diagnosis not present

## 2023-08-01 DIAGNOSIS — M542 Cervicalgia: Secondary | ICD-10-CM | POA: Diagnosis not present

## 2023-08-05 DIAGNOSIS — M542 Cervicalgia: Secondary | ICD-10-CM | POA: Diagnosis not present

## 2023-08-06 LAB — MAC SUSCEPTIBILITY BROTH
Amikacin: 8
Ciprofloxacin: 8
Clarithromycin: 1
Doxycycline: >8
Minocycline: >8
Moxifloxacin: 4
Rifabutin: 0.5
Rifampin: 4
Streptomycin: 16

## 2023-08-06 LAB — ACID FAST CULTURE WITH REFLEXED SENSITIVITIES (MYCOBACTERIA): Acid Fast Culture: POSITIVE — AB

## 2023-08-06 LAB — ACID FAST ID BY PCR AND SUSCEPTIBILITIES
M Tuberculosis Complex: NEGATIVE
M avium complex: POSITIVE — AB

## 2023-08-07 DIAGNOSIS — M542 Cervicalgia: Secondary | ICD-10-CM | POA: Diagnosis not present

## 2023-08-07 DIAGNOSIS — K589 Irritable bowel syndrome without diarrhea: Secondary | ICD-10-CM | POA: Diagnosis not present

## 2023-08-12 DIAGNOSIS — M542 Cervicalgia: Secondary | ICD-10-CM | POA: Diagnosis not present

## 2023-08-13 LAB — ACID FAST CULTURE WITH REFLEXED SENSITIVITIES (MYCOBACTERIA): Acid Fast Culture: NEGATIVE

## 2023-08-14 NOTE — Addendum Note (Signed)
 Addended by: Annitta Kindler on: 08/14/2023 07:35 PM   Modules accepted: Orders

## 2023-08-15 DIAGNOSIS — J45909 Unspecified asthma, uncomplicated: Secondary | ICD-10-CM | POA: Diagnosis not present

## 2023-08-15 DIAGNOSIS — J45998 Other asthma: Secondary | ICD-10-CM | POA: Diagnosis not present

## 2023-08-18 ENCOUNTER — Ambulatory Visit
Admission: RE | Admit: 2023-08-18 | Discharge: 2023-08-18 | Disposition: A | Discharge status: Home / Self Care | Source: Ambulatory Visit | Attending: Pulmonary Disease

## 2023-08-18 ENCOUNTER — Ambulatory Visit
Admission: RE | Admit: 2023-08-18 | Discharge: 2023-08-18 | Disposition: A | Discharge status: Home / Self Care | Attending: Pulmonary Disease | Admitting: Pulmonary Disease

## 2023-08-18 DIAGNOSIS — R053 Chronic cough: Secondary | ICD-10-CM | POA: Insufficient documentation

## 2023-08-18 DIAGNOSIS — R0602 Shortness of breath: Secondary | ICD-10-CM | POA: Diagnosis not present

## 2023-08-18 DIAGNOSIS — J439 Emphysema, unspecified: Secondary | ICD-10-CM | POA: Diagnosis not present

## 2023-08-19 DIAGNOSIS — M542 Cervicalgia: Secondary | ICD-10-CM | POA: Diagnosis not present

## 2023-08-23 ENCOUNTER — Other Ambulatory Visit: Payer: Self-pay | Admitting: Internal Medicine

## 2023-08-25 ENCOUNTER — Ambulatory Visit: Payer: Self-pay

## 2023-08-25 ENCOUNTER — Emergency Department

## 2023-08-25 ENCOUNTER — Encounter: Payer: Self-pay | Admitting: Intensive Care

## 2023-08-25 ENCOUNTER — Other Ambulatory Visit: Payer: Self-pay

## 2023-08-25 ENCOUNTER — Emergency Department
Admission: EM | Admit: 2023-08-25 | Discharge: 2023-08-25 | Disposition: A | Discharge status: Home / Self Care | Attending: Emergency Medicine | Admitting: Emergency Medicine

## 2023-08-25 DIAGNOSIS — H5711 Ocular pain, right eye: Secondary | ICD-10-CM | POA: Diagnosis not present

## 2023-08-25 DIAGNOSIS — R519 Headache, unspecified: Secondary | ICD-10-CM | POA: Diagnosis not present

## 2023-08-25 DIAGNOSIS — R001 Bradycardia, unspecified: Secondary | ICD-10-CM | POA: Diagnosis not present

## 2023-08-25 DIAGNOSIS — H538 Other visual disturbances: Secondary | ICD-10-CM | POA: Insufficient documentation

## 2023-08-25 DIAGNOSIS — G319 Degenerative disease of nervous system, unspecified: Secondary | ICD-10-CM | POA: Diagnosis not present

## 2023-08-25 DIAGNOSIS — R4182 Altered mental status, unspecified: Secondary | ICD-10-CM | POA: Diagnosis not present

## 2023-08-25 DIAGNOSIS — H539 Unspecified visual disturbance: Secondary | ICD-10-CM | POA: Diagnosis not present

## 2023-08-25 LAB — APTT: aPTT: 30 s (ref 24–36)

## 2023-08-25 LAB — DIFFERENTIAL
Abs Immature Granulocytes: 0.01 10*3/uL (ref 0.00–0.07)
Basophils Absolute: 0.1 10*3/uL (ref 0.0–0.1)
Basophils Relative: 1 %
Eosinophils Absolute: 0.2 10*3/uL (ref 0.0–0.5)
Eosinophils Relative: 4 %
Immature Granulocytes: 0 %
Lymphocytes Relative: 27 %
Lymphs Abs: 1.2 10*3/uL (ref 0.7–4.0)
Monocytes Absolute: 0.7 10*3/uL (ref 0.1–1.0)
Monocytes Relative: 16 %
Neutro Abs: 2.2 10*3/uL (ref 1.7–7.7)
Neutrophils Relative %: 52 %

## 2023-08-25 LAB — COMPREHENSIVE METABOLIC PANEL WITH GFR
ALT: 17 U/L (ref 0–44)
AST: 30 U/L (ref 15–41)
Albumin: 4 g/dL (ref 3.5–5.0)
Alkaline Phosphatase: 67 U/L (ref 38–126)
Anion gap: 10 (ref 5–15)
BUN: 9 mg/dL (ref 8–23)
CO2: 26 mmol/L (ref 22–32)
Calcium: 8.9 mg/dL (ref 8.9–10.3)
Chloride: 99 mmol/L (ref 98–111)
Creatinine, Ser: 0.8 mg/dL (ref 0.44–1.00)
GFR, Estimated: >60 mL/min (ref >60)
Glucose, Bld: 81 mg/dL (ref 70–99)
Potassium: 3.9 mmol/L (ref 3.5–5.1)
Sodium: 135 mmol/L (ref 135–145)
Total Bilirubin: 0.8 mg/dL (ref 0.0–1.2)
Total Protein: 6.8 g/dL (ref 6.5–8.1)

## 2023-08-25 LAB — CBG MONITORING, ED: Glucose-Capillary: 88 mg/dL (ref 70–99)

## 2023-08-25 LAB — CBC
HCT: 40.6 % (ref 36.0–46.0)
Hemoglobin: 13.4 g/dL (ref 12.0–15.0)
MCH: 29.6 pg (ref 26.0–34.0)
MCHC: 33 g/dL (ref 30.0–36.0)
MCV: 89.8 fL (ref 80.0–100.0)
Platelets: 206 10*3/uL (ref 150–400)
RBC: 4.52 MIL/uL (ref 3.87–5.11)
RDW: 13.2 % (ref 11.5–15.5)
WBC: 4.3 10*3/uL (ref 4.0–10.5)
nRBC: 0 % (ref 0.0–0.2)

## 2023-08-25 LAB — ETHANOL: Alcohol, Ethyl (B): <15 mg/dL (ref <15)

## 2023-08-25 LAB — PROTIME-INR
INR: 1 (ref 0.8–1.2)
Prothrombin Time: 13.6 s (ref 11.4–15.2)

## 2023-08-25 LAB — SEDIMENTATION RATE: Sed Rate: 10 mm/h (ref 0–30)

## 2023-08-25 MED ORDER — DOXYCYCLINE HYCLATE 100 MG PO TABS: 100.0000 mg | ORAL_TABLET | Freq: Two times a day (BID) | ORAL | 0 refills | Status: AC

## 2023-08-25 MED ORDER — ACETAMINOPHEN 500 MG PO TABS
1000.0000 mg | ORAL_TABLET | Freq: Once | ORAL | Status: AC
  Administered 2023-08-25: 1000 mg via ORAL
  Filled 2023-08-25: qty 2

## 2023-08-25 NOTE — Discharge Instructions (Addendum)
 Will start you on some antibiotics in case this could be related to a sinus infection.  You should call the ophthalmology number and ask for a follow-up for tomorrow after we discussed her case with Dr. Donalda Fruit from ophthalmology. State you need ER follow up- they should get you in tomorrow.   Return to the ER for worsening symptoms or any other concerns  Sed rate and MRI brain was negative

## 2023-08-25 NOTE — Telephone Encounter (Signed)
 Chief Complaint:  Eye Pain  Symptoms:  -Feels like their is a foreign body in her eye, blurred vision, sensitivity to light, headache   Frequency:  Onset: Friday    Patient denies  Severe eye pain, fever, trauma to the site, vomiting, pus   Disposition: [X ]ED /[ ] Urgent Care (no appt availability in office) / [ ] Appointment(In office/virtual)/ [ ]  [place] [facility]/ [ ] Home Care/ [ ] Refused Recommended Disposition /[ ] [place] [facility]/ [ ]  Follow-up with PCP   Additional Notes:  Referred to ED.Pt verbalized understanding and will have someone else drive her.   --Pt also mentioned pharmacy has faxed a form pertaining to her pantoprazole . She is needing this to be addressed as soon as possible.  Preferred pharm: [id number]     Complete triage note below:  Copied from CRM #[id number]. Topic: Clinical - Red Word Triage >> Aug 25, 2023 10:22 AM Lisa Rideau A wrote: [facility] that prompted transfer to Nurse Triage: Right eye has pain, light sensitivity and red. Patient is having a headache as well. Reason for Disposition  [1] Foreign body sensation ("feels like something is in there") AND [2] irrigation didn't help  Answer Assessment - Initial Assessment Questions 1. ONSET: "When did the pain start?" (e.g., minutes, hours, days)     -----Friday. Noted she is in a new enviornment. Has been temp displaced d/t renovation    2. TIMING: "Does the pain come and go, or has it been constant since it started?" (e.g., constant, intermittent, fleeting)     -- Constant    3. SEVERITY: "How bad is the pain?"   (Scale 1-10; mild, moderate or severe)        -- 4-5/10 --"Discomfort"   4. LOCATION: "Where does it hurt?"  (e.g., eyelid, eye, cheekbone)     -Right Eyes    5. CAUSE: "What do you think is causing the pain?"     --- Unsure    6. VISION: "Do you have blurred vision or changes in your vision?"      ---- Yes, blurred vision   7. EYE DISCHARGE: "Is there any  discharge (pus) from the eye(s)?"  If Yes, ask: "What color is it?"      ------ Denies    8. FEVER: "Do you have a fever?" If Yes, ask: "What is it, how was it measured, and when did it start?"      ----- Denies    9. OTHER SYMPTOMS: "Do you have any other symptoms?" (e.g., headache, nasal discharge, facial rash)  ------Headache--- 3/10. Intermittent ( forehead, behind her eyes)  ------Difficulty opening -------Sensitive to light  --------Difficulty reading  --------Feels like their maybe something there   Med: Eye drops ( dry eye), antihist. --Seen by Optha 2 weeks ago. --Derm:.prescribed a cream for a face.  Protocols used: Eye Pain and Other Symptoms-A-AH

## 2023-08-25 NOTE — ED Provider Notes (Signed)
 [facility] Provider Note    Event Date/Time   First MD Initiated Contact with Patient 08/25/23 1219     (approximate)   History   Headache and Blurred Vision   HPI  Alison Bradshaw is a 83 y.o. female who comes in with concerns for difficulty focusing on her right eye and having to focus more on her speech.  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  However she also does report some increased effort to find her words as well.  She does report a little bit of a mild headache.  Denies it being worst headache of her life or sudden or severe in onset.  She reports that she woke up this morning and she had pain in the right eye with difficulty opening the eye.   Physical Exam   Triage Vital Signs: ED Triage Vitals  Encounter Vitals Group     BP 08/25/23 1116 134/74     Systolic BP Percentile --      Diastolic BP Percentile --      Pulse Rate 08/25/23 1116 64     Resp 08/25/23 1116 16     Temp 08/25/23 1116 98 F (36.7 C)     Temp Source 08/25/23 1116 Oral     SpO2 08/25/23 1116 99 %     Weight 08/25/23 1117 140 lb (63.5 kg)     Height 08/25/23 1117 5\' 3"  (1.6 m)     Head Circumference --      Peak Flow --      Pain Score 08/25/23 1117 4     Pain Loc --      Pain Education --      Exclude from Growth Chart --     Most recent vital signs: Vitals:   08/25/23 1116 08/25/23 1333  BP: 134/74 (!) 147/82  Pulse: 64 (!) 54  Resp: 16 17  Temp: 98 F (36.7 C)   SpO2: 99% 100%     General: Awake, no distress.  CV:  Good peripheral perfusion.  Resp:  Normal effort.  Abd:  No distention.  Other:  Patient was performed by RN which was 20/40 in both eyes with her glasses on.  Her pressure is 13  bilaterally.  Extraocular movements are intact.  She has a little bit of maxillary sinus tenderness   ED Results / Procedures / Treatments   Labs (all labs ordered are listed, but only abnormal results are displayed) Labs Reviewed  PROTIME-INR  APTT  CBC  DIFFERENTIAL  COMPREHENSIVE METABOLIC PANEL WITH GFR  ETHANOL  SEDIMENTATION RATE  CBG MONITORING, ED     EKG  My interpretation of EKG:  Sinus bradycardia rate of 57 without any ST elevation or T wave versions, normal intervals  RADIOLOGY I have reviewed the CT personally and interpreted no evidence of intracranial hemorrhage   PROCEDURES:  Critical Care performed: No  Procedures   MEDICATIONS ORDERED IN ED: Medications  acetaminophen  (TYLENOL ) tablet 1,000 mg (1,000 mg Oral Given 08/25/23 1337)     IMPRESSION / MDM / ASSESSMENT AND PLAN / ED COURSE  I reviewed the triage vital signs and the nursing notes.   Patient's presentation is most consistent with  acute presentation with potential threat to life or bodily function.   Patient comes in with mild headache, some difficulties with her right eye.  This is not a primary eye issue versus a migraine but she denies any history of migraines and eating years old that would be atypical. Not sudden or severe to suggest SAH. No black vision loss to suggest CRAO  Will add on sed rate to evaluate for temporal arteritis.  I also will get MRI given she does report some issues with her speech although her NIH stroke scale is 0 for me she is out of the window for code stroke and symptoms have been going on for 3 days.  I did consult with the ophthalmologist Dr. Donalda Fruit and he agreed with the MRI and the above workup.  If MRI is negative then they will follow her up in clinic tomorrow to evaluate her eye as this could be related to her retinal wrinkle.   Discussed with pain and she feels comfortable with this plan.  Sed rate is normal coags normal CBC reassuring CMP  reassuring  Patient about oncoming team pending MRI and outpatient follow-up with ophthalmology will start her on doxy given she does report some nasal congestion for 2 weeks and does have a little bit of pressure over her sinuses.   FINAL CLINICAL IMPRESSION(S) / ED DIAGNOSES   Final diagnoses:  Blurred vision, right eye  Nonintractable headache, unspecified chronicity pattern, unspecified headache type     Rx / DC Orders   ED Discharge Orders     None        Note:  This document was prepared using Dragon voice recognition software and may include unintentional dictation errors.   Lubertha Rush, MD 08/25/23 1539

## 2023-08-25 NOTE — ED Triage Notes (Signed)
 Patient c/o right eye vision focus issue. Describes as inability to focus in right eye. Reports some blurry vision and cannot read when trying. Symptoms started three days ago.  Patient also reports she feels like her speech is slower  C/o headache today

## 2023-08-25 NOTE — ED Notes (Addendum)
 Pt reporting having difficulty focusing out of right eye and difficulty reading x3 days. States she is feeling like she is also having to focus more on her speech. Speech normal to this RN at this time. NIH 0 at this time.

## 2023-08-25 NOTE — ED Notes (Signed)
 Visual Acuity:   W/o glasses: right eye: 20/50, left eye: 20/100, both: 20/70  With glasses: right eye: 20/40, left eye: 20/40, both: 20/30

## 2023-08-25 NOTE — ED Notes (Signed)
 PT to MRI at this time

## 2023-08-25 NOTE — Telephone Encounter (Signed)
 Pt was advised by E2C2 RN to be seen at the ED for the eye pain. Pt gave a verbal understanding and stated that she would have someone take her.   Pantoprazole  has been refilled.

## 2023-08-26 ENCOUNTER — Ambulatory Visit: Payer: Medicare PPO | Admitting: Pulmonary Disease

## 2023-08-26 ENCOUNTER — Encounter: Payer: Self-pay | Admitting: Pulmonary Disease

## 2023-08-26 ENCOUNTER — Encounter (INDEPENDENT_AMBULATORY_CARE_PROVIDER_SITE_OTHER): Payer: Self-pay

## 2023-08-26 VITALS — BP 118/80 | HR 58 | Temp 97.3°F | Ht 63.0 in | Wt 142.2 lb

## 2023-08-26 DIAGNOSIS — J984 Other disorders of lung: Secondary | ICD-10-CM | POA: Diagnosis not present

## 2023-08-26 DIAGNOSIS — J302 Other seasonal allergic rhinitis: Secondary | ICD-10-CM | POA: Diagnosis not present

## 2023-08-26 DIAGNOSIS — R911 Solitary pulmonary nodule: Secondary | ICD-10-CM | POA: Diagnosis not present

## 2023-08-26 DIAGNOSIS — M542 Cervicalgia: Secondary | ICD-10-CM | POA: Diagnosis not present

## 2023-08-26 DIAGNOSIS — J4540 Moderate persistent asthma, uncomplicated: Secondary | ICD-10-CM

## 2023-08-26 NOTE — Progress Notes (Addendum)
 Synopsis: Referred in by Thersia Flax, MD   Subjective:   PATIENT ID: Alison Bradshaw GENDER: female DOB: Aug 15, 1940, MRN: [id number]  Chief Complaint  Patient presents with   Follow-up    Breathing is better. DOE. No wheezing. Cough with occasional clear sputum.     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 Creta Dolin for further evaluation on her asthma.   I initially saw her in August 2024, Patient reports that about 3 months ago she started having labored breathing.  Describes it as feeling of working hard to take a deep breath in.  She notices that usually during hot humid weather.  She denies any wheezing.  She denies any chest pain. She does report seasonal nasal congestion and possible post nasal drip.   We started her on Symbicort  80-4.5 which helped with her breathing. She stopped her symbicort  as it caused her hoarseness of the voice.   She reports feeling better today, still with cough and constant clearance of throat. Using her rescue inhaler once a week. Feels better using the aerochamber device.   PFTs with obstructive defect and good response to BDL. DLCO is normal. Overall consistent with asthma.   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 New solid nodule in the RLL.   Family history - Emphysema in the family   Social History - Never smoker, denies alcohol use, denies any illict drug use. Has a dog at home and worked as a Warden/ranger.    OV 06/2023 Dr. Darnelle Elders - Seen in clinic by Dr. Darnelle Elders for ongoing cough sputum production and extensive runny nose. 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   OV 08/26/2023 - She has since improved significantly. Her  shortness of breath and chest tightness has improved what's lingering is the constant feeling of needing to clear her throat. Uses symbicort  160 as prescribed and rarely uses her rescue inhaler. She is seeing ID next week to take their opinion regarding MAC treatment. ACT 22 today.   ROS All systems were reviewed and are negative except for the above.  Objective:   Vitals:   08/26/23 0957  BP: 118/80  Pulse: (!) 58  Temp: (!) 97.3 F (36.3 C)  SpO2: 98%  Weight: 142 lb 3.2 oz (64.5 kg)  Height: 5\' 3"  (1.6 m)   98% on RA BMI Readings from Last 3 Encounters:  08/26/23 25.19 kg/m  08/25/23 24.80 kg/m  07/10/23 25.61 kg/m   Wt Readings from Last 3 Encounters:  08/26/23 142 lb 3.2 oz (64.5 kg)  08/25/23 140 lb (63.5 kg)  07/10/23 144 lb 9.6 oz (65.6 kg)    Physical Exam GEN: NAD, Healthy Appearing HEENT: Supple Neck, Reactive Pupils, EOMI, erythematous nasal mucosa. CVS: Normal S1, Normal S2, RRR, No murmurs or ES appreciated  Lungs: Clear bilateral air entry.  Abdomen: Soft, non tender, non distended, + BS  Extremities: Warm and well perfused, No edema  Skin: No suspicious lesions appreciated  Psych: Normal Affect  Ancillary Information   CBC    Component Value Date/Time   WBC 4.3 08/25/2023 1125   RBC 4.52 08/25/2023 1125   HGB 13.4 08/25/2023 1125   HCT 40.6 08/25/2023 1125  PLT 206 08/25/2023 1125   MCV 89.8 08/25/2023 1125   MCH 29.6 08/25/2023 1125   MCHC 33.0 08/25/2023 1125   RDW 13.2 08/25/2023 1125   LYMPHSABS 1.2 08/25/2023 1125   MONOABS 0.7 08/25/2023 1125   EOSABS 0.2 08/25/2023 1125   BASOSABS 0.1 08/25/2023 1125    PFT 2019 -  FVC: 3.05 L (115 %pred), FEV1: 1.81 L (89 %pred), FEV1/FVC: 60%, TLC: 4.64 L (93 %pred), DLCO 82 %pred  CT chest 09/07/2018  There is bandlike scarring of the right middle lobe and lingula with clustered nodularity of the right upper lobe. Previously noted clustered nodules of the posterior right upper lobe in this  vicinity are almost completely resolved. 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 No new nodules are noted.  CT chest 2024 1. Clustered bilateral solid pulmonary nodules and ground-glass opacities most pronounced in the posterior right upper lobe, right middle lobe and lingula, slightly worsened when compared with prior exam with some pulmonary nodules increased in size and others new. 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 2. New irregular solid pulmonary nodule of the right lower lobe measuring 6 mm. Non-contrast chest CT at 3-6 months is recommended. If the nodules are stable at time of repeat CT, then future CT at 18-24 months (from today's scan) is considered optional for low-risk patients, but is recommended for high-risk patients. This recommendation follows the consensus statement: Guidelines for Management of Incidental Pulmonary Nodules Detected on CT Images: From the Fleischner Society 2017; Radiology 2017; 284:228-243. 3. Mild coronary artery calcifications and aortic Atherosclerosis (ICD10-I70.0).    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 Creta Dolin for further evaluation of shortness of breath.  #Moderate persistent asthma without exacerbation  #Granulomatous lung disease? Tree  in bud in the RUL with some scarring, Sputum AFB smear and culture consistent with MAC.  #Allergic rhinitis  #RLL 6 mm nodule  EOS 200  PFTs with obstructive defect and mildly reduced DLCO but when corrected to VA it is normal. Good response to bronchodilators. Latter suggestive of persistent asthma with airway remodeling.   []  C/w Budesonide -Formoterol  [Symbicort ] to 160-4.13mcg 2 puffs twice a day with aerochamber to limit laryngeal irritation.  []  c/w Albuterol  Inh PRN 2 puffs Q6H  []  c/w Flonase  1 puff each nares daily.  []  Allergen panel and IGE (Normal) []  Will consider starting dupixent if step up therapy is needed.   RTC 3 months.   I spent 20 minutes caring for this patient today, including preparing to see the patient, obtaining a medical history , reviewing a separately obtained history, performing a medically appropriate examination and/or evaluation, counseling and educating the  patient/family/caregiver, ordering medications, tests, or procedures, documenting clinical information in the electronic health record, and independently interpreting results (not separately reported/billed) and communicating results to the patient/family/caregiver  Annitta Kindler, MD [facility] 08/26/2023 10:06 AM

## 2023-08-27 DIAGNOSIS — H35371 Puckering of macula, right eye: Secondary | ICD-10-CM | POA: Diagnosis not present

## 2023-08-27 DIAGNOSIS — H04123 Dry eye syndrome of bilateral lacrimal glands: Secondary | ICD-10-CM | POA: Diagnosis not present

## 2023-08-27 DIAGNOSIS — Z961 Presence of intraocular lens: Secondary | ICD-10-CM | POA: Diagnosis not present

## 2023-08-27 DIAGNOSIS — H43813 Vitreous degeneration, bilateral: Secondary | ICD-10-CM | POA: Diagnosis not present

## 2023-08-29 LAB — ACID FAST SMEAR (AFB, MYCOBACTERIA): Acid Fast Smear: NEGATIVE

## 2023-08-29 LAB — ACID FAST ID BY PCR AND SUSCEPTIBILITIES
M Tuberculosis Complex: NEGATIVE
M avium complex: POSITIVE — AB

## 2023-08-29 LAB — FUNGUS CULTURE WITH STAIN

## 2023-08-29 LAB — FUNGAL ORGANISM REFLEX

## 2023-08-29 LAB — FUNGUS CULTURE RESULT

## 2023-08-29 LAB — ACID FAST CULTURE WITH REFLEXED SENSITIVITIES (MYCOBACTERIA): Acid Fast Culture: POSITIVE — AB

## 2023-09-02 ENCOUNTER — Ambulatory Visit: Attending: Infectious Diseases | Admitting: Infectious Diseases

## 2023-09-02 ENCOUNTER — Encounter: Payer: Self-pay | Admitting: Infectious Diseases

## 2023-09-02 VITALS — BP 109/58 | HR 65 | Temp 97.3°F | Ht 63.0 in | Wt 144.0 lb

## 2023-09-02 DIAGNOSIS — F419 Anxiety disorder, unspecified: Secondary | ICD-10-CM | POA: Diagnosis not present

## 2023-09-02 DIAGNOSIS — Z8701 Personal history of pneumonia (recurrent): Secondary | ICD-10-CM | POA: Insufficient documentation

## 2023-09-02 DIAGNOSIS — K219 Gastro-esophageal reflux disease without esophagitis: Secondary | ICD-10-CM | POA: Diagnosis not present

## 2023-09-02 DIAGNOSIS — Z79899 Other long term (current) drug therapy: Secondary | ICD-10-CM | POA: Insufficient documentation

## 2023-09-02 DIAGNOSIS — E7800 Pure hypercholesterolemia, unspecified: Secondary | ICD-10-CM | POA: Diagnosis not present

## 2023-09-02 DIAGNOSIS — R490 Dysphonia: Secondary | ICD-10-CM | POA: Insufficient documentation

## 2023-09-02 DIAGNOSIS — J45909 Unspecified asthma, uncomplicated: Secondary | ICD-10-CM | POA: Diagnosis not present

## 2023-09-02 DIAGNOSIS — R918 Other nonspecific abnormal finding of lung field: Secondary | ICD-10-CM | POA: Insufficient documentation

## 2023-09-02 DIAGNOSIS — A310 Pulmonary mycobacterial infection: Secondary | ICD-10-CM | POA: Diagnosis not present

## 2023-09-02 NOTE — Patient Instructions (Signed)
 Today, you were seen for an evaluation of an atypical mycobacterial lung infection. You have a history of pulmonary nodules and recurrent pneumonia, and recently experienced a prolonged cough. We discussed your current symptoms, reviewed your recent CT scans and sputum test results, and talked about your treatment options.  YOUR PLAN:  -ATYPICAL MYCOBACTERIAL LUNG INFECTION: This is a chronic lung infection caused by a type of bacteria that is not commonly found in the general population. Your CT scans show stable nodules, and your sputum test confirmed the presence of these bacteria. But the sputum smear is negative for AFB Since your symptoms are minimal and the disease appears stable, we will monitor your condition with a possible high-resolution CT scan and repeat sputum culture in 3-6 months. I will discuss with pulmonologist and radiologist. You were informed about the potential side effects of medications like azithromycin , rifabutin/, and ethambutol.  -ASTHMA: Asthma is a condition where your airways narrow and swell, causing difficulty in breathing. You were diagnosed with asthma by your pulmonologist and are managing it with an albuterol  inhaler as needed.  -PNEUMONIA: Pneumonia is an infection that inflames the air sacs in one or both lungs. You have a history of recurrent pneumonia, which may contribute to your current respiratory symptoms and susceptibility to infections.  -GERD: Gastroesophageal reflux disease (GERD) is a digestive disorder where stomach acid irritates the food pipe lining. Your GERD is well-controlled with pantoprazole .   INSTRUCTIONS:  We will monitor your atypical mycobacterial lung infection with a possible high-resolution CT scan and repeat sputum culture in 3-six months. . Continue using your albuterol  inhaler as needed for asthma, and take your prescribed medications for GERD and anxiety as directed.

## 2023-09-02 NOTE — Progress Notes (Signed)
 NAME: Alison Bradshaw  DOB: 05-07-1940  MRN: [id number]  Date/Time: 09/02/2023 10:15 AM   Subjective:     Alison Bradshaw is an 83 year old female with pulmonary nodules and recurrent pneumonia who presents for evaluation of atypical mycobacterial infection. She was referred by pulmonologist for further evaluation of MAI  As per patient She has a long-standing history of pulmonary nodules, monitored for over twenty years at [facility] . She experienced a severe bacterial pneumonia about twenty years ago and has had recurrent episodes since. Recently, she had a prolonged episode of cough lasting five months since 2024 , beginning with fever and chills, characterized by a 'racking cough' with minimal phlegm production, leading to a referral to a pulmonologist.  In the past year, she underwent two CT chest scans, one in Aug 2024 and another in feb 2025. In march 2025 three sputums were sent for AFB- 2 of the culture came positive for Mycobacterium avium , but smear negative  Currently, she experiences persistent hoarseness and 'gunk' in her throat, affecting her singing and speaking abilities. Despite these respiratory issues, she describes herself as generally healthy and active.  Her past medical history includes asthma, diagnosed following the recent prolonged cough episode, a history of whooping cough as a child, and a significant pneumonia episode twenty years ago.  Her current medications include an albuterol  inhaler, rosuvastatin  for cholesterol, pantoprazole  for GERD, and alprazolam  for anxiety. She has used azithromycin  and doxycycline  in the past for respiratory and eye issues, respectively.  No history of smoking, diabetes, high blood pressure, or heart issues. She lives alone following the passing of her husband four years ago after fifty-one years of marriage  Past Medical History:  Diagnosis Date   Anxiety    Asthma    Atypical chest pain    Barrett's esophagus    Chronic  gastritis    Diverticulosis    Duodenal ulcer    Gastritis    GERD (gastroesophageal reflux disease)    Hiatal hernia    History of echocardiogram    a. 08/2020 Echo: EF 60-65%, no rwma, Nl RV fxn, mild MR.   Hyperlipidemia    Non-obstructive CAD (coronary artery disease)    a. 10/2017 Stress Echo: Nl EF, no ischemia. Mild MR; b. 12/2017 Cardiac CTA: LM nl, LAD 50p, 30d, D1/2 nl, RI nl, LCX nl, OM1/2 nl, RCA nl, RPDA/RPL nl. Ca2+ score = 39 (46th percentile)-->statin dose escalated; c. 10/2021 MV: No ischemia/infarct. Nl LV fxn.   Osteopenia    Peptic ulcer, site unspecified, unspecified as acute or chronic, without hemorrhage or perforation 11/19/2012   Formatting of this note might be different from the original.  duodenum 1966  Formatting of this note might be different from the original.  Overview:   duodenum 1966   PSVT (paroxysmal supraventricular tachycardia) (HCC)    a. 10/2017 Holter: PSVT, max 5 beats->did not tolerate beta blocker; b. 09/2020 Zio: 38 runs of SVT. Longest 18.1 secs (106), fastest 203 bpm (6 beats). Occas PVCs (1.1%) - some assoc w/ triggered events; C. 11/2022 Zio: Predominantly sinus rhythm @ 69 (48-182).  34 runs of PSVT, longest 12 beats.  3.3% PVC burden.  No sustained arrhythmias or A-fib.   Pulmonary nodules    a. 12/2017 Cardiac CTA: RLL 9mm nodule; b. 04/2018 RLL 9mm nodule resolved; c. 09/2018 CT Chest: Previously noted posterior RUL nodules almost completely resolved. Bandlike scarring of RML and lingula-->atyp infxn; d. 11/2022 CT Chest: Clustered bilateral solid pulmonary nodules  and groundglass opacities, most pronounced in posterior RUL, RML, and lingula - ? chronic atyp infxn. New 6mm RLL nodule.   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   BREAST BIOPSY Left 2008   neg   BREAST EXCISIONAL BIOPSY Left 2004   neg   CATARACT EXTRACTION W/ INTRAOCULAR LENS  IMPLANT, BILATERAL Bilateral    COLONOSCOPY WITH PROPOFOL  N/A 01/22/2017   Procedure: COLONOSCOPY WITH PROPOFOL ;  Surgeon: Toledo, Alphonsus Jeans, MD;  Location: [facility] ENDOSCOPY;  Service: Gastroenterology;  Laterality: N/A;   COLONOSCOPY WITH PROPOFOL  N/A 03/26/2022   Procedure: COLONOSCOPY WITH PROPOFOL ;  Surgeon: Shane Darling, MD;  Location: [facility] ENDOSCOPY;  Service: Endoscopy;  Laterality: N/A;   ESOPHAGOGASTRODUODENOSCOPY (EGD) WITH PROPOFOL  N/A 01/20/2015   Procedure: ESOPHAGOGASTRODUODENOSCOPY (EGD) WITH PROPOFOL ;  Surgeon: Deveron Fly, MD;  Location[facility] ENDOSCOPY;  Service: Endoscopy;  Laterality: N/A;   ESOPHAGOGASTRODUODENOSCOPY (EGD) WITH PROPOFOL  N/A 01/22/2017   Procedure: ESOPHAGOGASTRODUODENOSCOPY (EGD) WITH PROPOFOL ;  Surgeon: Toledo, Alphonsus Jeans, MD;  Location: [facility] ENDOSCOPY;  Service: Gastroenterology;  Laterality: N/A;   ESOPHAGOGASTRODUODENOSCOPY (EGD) WITH PROPOFOL  N/A 04/14/2018   Procedure: ESOPHAGOGASTRODUODENOSCOPY (EGD) WITH PROPOFOL ;  Surgeon: Deveron Fly, MD;  Location: [facility] ENDOSCOPY;  Service: Endoscopy;  Laterality: N/A;   ESOPHAGOGASTRODUODENOSCOPY (EGD) WITH PROPOFOL  N/A 10/16/2021   Procedure: ESOPHAGOGASTRODUODENOSCOPY (EGD) WITH PROPOFOL ;  Surgeon: Shane Darling, MD;  Location: [facility] ENDOSCOPY;  Service: Endoscopy;  Laterality: N/A;   EYE SURGERY Right    laser scraping   OPEN REDUCTION INTERNAL FIXATION (ORIF) DISTAL RADIAL FRACTURE Right 02/09/2018   Procedure: OPEN REDUCTION INTERNAL FIXATION (ORIF) DISTAL RADIAL FRACTURE;  Surgeon: Molli Angelucci, MD;  Location: [facility] ORS;  Service: Orthopedics;  Laterality: Right;   TONSILLECTOMY      Social History   Socioeconomic History   Marital status: Widowed     Spouse name: Not on file   Number of children: 2   Years of education: Not on file   Highest education level: Not on file  Occupational History   Occupation: retired  Tobacco Use   Smoking status: Never    Passive exposure: Past   Smokeless tobacco: Never   Tobacco comments:    Quit at age 49, smoked 1 pack a month   Vaping Use   Vaping status: Never Used  Substance and Sexual Activity   Alcohol use: Yes    Alcohol/week: 2.0 standard drinks of alcohol    Types: 2 Glasses of wine per week   Drug use: No   Sexual activity: Not Currently    Partners: Male  Other Topics Concern   Not on file  Social History Narrative   no drug use no alcohol   Social Drivers of Corporate investment banker Strain: Low Risk  (02/05/2023)   Received from [facility]   Overall Financial Resource Strain (CARDIA)    Difficulty of Paying Living Expenses: Not hard at all  Food Insecurity: No Food Insecurity (02/05/2023)   Received from [facility]   Hunger Vital Sign    Worried About Running Out of Food in the Last Year: Never true  Ran Out of Food in the Last Year: Never true  Transportation Needs: No Transportation Needs (02/05/2023)   Received from [facility] - Transportation    In the past 12 months, has lack of transportation kept you from medical appointments or from getting medications?: No    Lack of Transportation (Non-Medical): No  Physical Activity: Sufficiently Active (10/07/2022)   Exercise Vital Sign    Days of Exercise per Week: 7 days    Minutes of Exercise per Session: 90 min  Stress: No Stress Concern Present (10/07/2022)   [employer] of Occupational Health - Occupational Stress Questionnaire    Feeling of Stress : Not at all  Social Connections: Moderately Integrated (10/07/2022)   Social Connection and Isolation Panel [NHANES]    Frequency of Communication with Friends and Family: More than three times a  week    Frequency of Social Gatherings with Friends and Family: More than three times a week    Attends Religious Services: More than 4 times per year    Active Member of [employer] or Organizations: Yes    Attends Banker Meetings: More than 4 times per year    Marital Status: Widowed  Intimate Partner Violence: Not At Risk (10/07/2022)   Humiliation, Afraid, Rape, and Kick questionnaire    Fear of Current or Ex-Partner: No    Emotionally Abused: No    Physically Abused: No    Sexually Abused: No    Family History  Problem Relation Age of Onset   Hyperlipidemia Mother    Alzheimer's disease Mother    Emphysema Father    AAA (abdominal aortic aneurysm) Father    Kidney Stones Father    Kidney cancer Brother    Pancreatic cancer Paternal Uncle    Diabetes Paternal Uncle    Stomach cancer Neg Hx    Colon cancer Neg Hx    Esophageal cancer Neg Hx    Allergies  Allergen Reactions   Percocet [Oxycodone -Acetaminophen ] Nausea Only   Ceftin [Cefuroxime Axetil] Hives and Rash   Timentin [Ticarcillin-Pot Clavulanate] Hives and Rash   I? Current Outpatient Medications  Medication Sig Dispense Refill   acetaminophen  (TYLENOL ) 500 MG tablet Take 1,000 mg by mouth every 6 (six) hours as needed.     albuterol  (PROVENTIL ) (2.5 MG/3ML) 0.083% nebulizer solution Take 3 mLs (2.5 mg total) by nebulization in the morning and at bedtime. 75 mL 11   albuterol  (VENTOLIN  HFA) 108 (90 Base) MCG/ACT inhaler Inhale 2 puffs into the lungs every 6 (six) hours as needed for shortness of breath. 3 each 0   ALPRAZolam  (XANAX ) 0.25 MG tablet Take 1 tablet (0.25 mg total) by mouth at bedtime as needed for anxiety. 30 tablet 2   benzonatate  (TESSALON ) 100 MG capsule Take 1 capsule (100 mg total) by mouth 3 (three) times daily as needed for cough. 21 capsule 0   budesonide -formoterol  (SYMBICORT ) 160-4.5 MCG/ACT inhaler Inhale 2 puffs into the lungs in the morning and at bedtime. 1 each 12    cyclobenzaprine  (FLEXERIL ) 5 MG tablet Take 1 tablet (5 mg total) by mouth at bedtime. 14 tablet 1   fluticasone  (FLONASE ) 50 MCG/ACT nasal spray Place 1 spray into both nostrils in the morning and at bedtime. 100 mL 2   guaiFENesin -codeine  (VIRTUSSIN A/C) 100-10 MG/5ML syrup Take 5 mLs by mouth at bedtime and may repeat dose one time if needed. 150 mL 0   ipratropium (ATROVENT ) 0.03 % nasal spray Place 2 sprays  into both nostrils every 12 (twelve) hours.     pantoprazole  (PROTONIX ) 40 MG tablet TAKE 1 TABLET(40 MG) BY MOUTH DAILY 90 tablet 3   rosuvastatin  (CRESTOR ) 10 MG tablet TAKE 1 TABLET(10 MG) BY MOUTH DAILY 90 tablet 1   sodium chloride  HYPERTONIC 3 % nebulizer solution Take by nebulization in the morning and at bedtime. 750 mL 12   Spacer/Aero-Holding Chambers (AEROCHAMBER MV) inhaler Use as instructed 1 each 0   sucralfate (CARAFATE) 1 G tablet Take 1 g by mouth as needed. Pt only takes when she's really needs it which isn't very often     No current facility-administered medications for this visit.     Abtx:  Anti-infectives (From admission, onward)    None       REVIEW OF SYSTEMS:  Const: negative fever, negative chills, negative weight loss Eyes: negative diplopia or visual changes, negative eye pain ENT: negative coryza, negative sore throat Resp: as above Cards: negative for chest pain, palpitations, lower extremity edema GU: negative for frequency, dysuria and hematuria GI: Negative for abdominal pain, diarrhea, bleeding, constipation Skin: negative for rash and pruritus Heme: negative for easy bruising and gum/nose bleeding MS: negative for myalgias, arthralgias, back pain and muscle weakness Neurolo:negative for headaches, dizziness, vertigo, memory problems  Psych:  anxiety  Endocrine: negative for thyroid , diabetes Allergy/Immunology- ceftin, timentin - rash ?  Objective:  VITALS:  BP (!) 109/58   Pulse 65   Temp (!) 97.3 F (36.3 C) (Temporal)   Ht 5'  3" (1.6 m)   Wt 144 lb (65.3 kg)   SpO2 98%   BMI 25.51 kg/m   PHYSICAL EXAM:  General: Alert, cooperative, no distress, appears stated age.  Head: Normocephalic, without obvious abnormality, atraumatic. Eyes: Conjunctivae clear, anicteric sclerae. Pupils are equal ENT Nares normal. No drainage or sinus tenderness. Lips, mucosa, and tongue normal. No Thrush Neck: Supple, symmetrical, no adenopathy, thyroid : non tender no carotid bruit and no JVD. Back: No CVA tenderness. Lungs: Clear to auscultation bilaterally. No Wheezing or Rhonchi. No rales. Heart: Regular rate and rhythm, no murmur, rub or gallop. Abdomen: Soft, non-tender,not distended. Bowel sounds normal. No masses Extremities: atraumatic, no cyanosis. No edema. No clubbing Skin: No rashes or lesions. Or bruising Lymph: Cervical, supraclavicular normal. Neurologic: Grossly non-focal Pertinent Labs Lab Results CBC    Component Value Date/Time   WBC 4.3 08/25/2023 1125   RBC 4.52 08/25/2023 1125   HGB 13.4 08/25/2023 1125   HCT 40.6 08/25/2023 1125   PLT 206 08/25/2023 1125   MCV 89.8 08/25/2023 1125   MCH 29.6 08/25/2023 1125   MCHC 33.0 08/25/2023 1125   RDW 13.2 08/25/2023 1125   LYMPHSABS 1.2 08/25/2023 1125   MONOABS 0.7 08/25/2023 1125   EOSABS 0.2 08/25/2023 1125   BASOSABS 0.1 08/25/2023 1125       Latest Ref Rng & Units 08/25/2023   11:25 AM 03/18/2023    6:00 PM 11/07/2022    1:19 PM  CMP  Glucose 70 - 99 mg/dL 81  098  89   BUN 8 - 23 mg/dL 9  11  14    Creatinine 0.44 - 1.00 mg/dL 1.19  1.47  8.29   Sodium 135 - 145 mmol/L 135  137  137   Potassium 3.5 - 5.1 mmol/L 3.9  3.7  4.9   Chloride 98 - 111 mmol/L 99  101  100   CO2 22 - 32 mmol/L 26  28  29    Calcium  8.9 -  10.3 mg/dL 8.9  9.1  40.9   Total Protein 6.5 - 8.1 g/dL 6.8  7.1  7.4   Total Bilirubin 0.0 - 1.2 mg/dL 0.8  1.0  0.9   Alkaline Phos 38 - 126 U/L 67  72  77   AST 15 - 41 U/L 30  29  25    ALT 0 - 44 U/L 17  22  13         Microbiology: 07/02/23- sputum AFB culture 2/3 MAC   IMAGING RESULTS: Multifocal clustered nodularity- 2025 VS 2020-not much of a progression    I have personally reviewed the films ? 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 Sputum cultures confirmed MAC with a negative smear, indicating low bacterial burden. Symptoms of persistent cough and hoarseness have improved. Discussed the risk and benefits of treating this infection Treatment with azithromycin , rifabutin ( or rifampin) , and ethambutol is complicated by potential side effects and her increased propensity to nausea and GI symptoms with meds Given the stable disease and minimal symptoms, observation is reasonable. . Will Discuss with  radiologist to assess lesion progression and consider a high-resolution CT scan for bronchiectasis or fibrosis evaluation. Will discuss with pulmonologist Dr.Assaker  regarding the treatment plan.   Asthma   Asthma diagnosed by  pulmonologist, . Managed with albuterol  inhaler as needed.  Pneumonia   History of recurrent pneumonia, with a significant episode 20 years ago, possibly contributing to current respiratory symptoms and infection susceptibility.  GERD   GERD is well-controlled with pantoprazole .  Anxiety   Anxiety is well-controlled with alprazolam  at night to aid sleep. ?     ________________________________________________ Discussed with patient in detail Follow up 3 months

## 2023-09-05 DIAGNOSIS — M542 Cervicalgia: Secondary | ICD-10-CM | POA: Diagnosis not present

## 2023-09-09 DIAGNOSIS — M542 Cervicalgia: Secondary | ICD-10-CM | POA: Diagnosis not present

## 2023-09-12 DIAGNOSIS — M542 Cervicalgia: Secondary | ICD-10-CM | POA: Diagnosis not present

## 2023-09-15 DIAGNOSIS — J45998 Other asthma: Secondary | ICD-10-CM | POA: Diagnosis not present

## 2023-09-15 DIAGNOSIS — J45909 Unspecified asthma, uncomplicated: Secondary | ICD-10-CM | POA: Diagnosis not present

## 2023-09-22 ENCOUNTER — Other Ambulatory Visit: Payer: Self-pay | Admitting: Internal Medicine

## 2023-09-23 DIAGNOSIS — M542 Cervicalgia: Secondary | ICD-10-CM | POA: Diagnosis not present

## 2023-09-24 NOTE — Progress Notes (Signed)
 Given visual disturnbance- stroke order set orders which including alcohool/drug and aPTT

## 2023-09-30 DIAGNOSIS — R1031 Right lower quadrant pain: Secondary | ICD-10-CM | POA: Diagnosis not present

## 2023-09-30 DIAGNOSIS — R1013 Epigastric pain: Secondary | ICD-10-CM | POA: Diagnosis not present

## 2023-09-30 DIAGNOSIS — R109 Unspecified abdominal pain: Secondary | ICD-10-CM | POA: Diagnosis not present

## 2023-09-30 DIAGNOSIS — R1032 Left lower quadrant pain: Secondary | ICD-10-CM | POA: Diagnosis not present

## 2023-10-02 DIAGNOSIS — Z961 Presence of intraocular lens: Secondary | ICD-10-CM | POA: Diagnosis not present

## 2023-10-02 DIAGNOSIS — H35373 Puckering of macula, bilateral: Secondary | ICD-10-CM | POA: Diagnosis not present

## 2023-10-02 DIAGNOSIS — H43399 Other vitreous opacities, unspecified eye: Secondary | ICD-10-CM | POA: Diagnosis not present

## 2023-10-02 DIAGNOSIS — H53483 Generalized contraction of visual field, bilateral: Secondary | ICD-10-CM | POA: Diagnosis not present

## 2023-10-02 DIAGNOSIS — H539 Unspecified visual disturbance: Secondary | ICD-10-CM | POA: Diagnosis not present

## 2023-10-15 DIAGNOSIS — J45998 Other asthma: Secondary | ICD-10-CM | POA: Diagnosis not present

## 2023-10-15 DIAGNOSIS — J45909 Unspecified asthma, uncomplicated: Secondary | ICD-10-CM | POA: Diagnosis not present

## 2023-10-30 DIAGNOSIS — H16223 Keratoconjunctivitis sicca, not specified as Sjogren's, bilateral: Secondary | ICD-10-CM | POA: Diagnosis not present

## 2023-10-30 DIAGNOSIS — H5213 Myopia, bilateral: Secondary | ICD-10-CM | POA: Diagnosis not present

## 2023-10-30 DIAGNOSIS — H35373 Puckering of macula, bilateral: Secondary | ICD-10-CM | POA: Diagnosis not present

## 2023-10-30 DIAGNOSIS — H52223 Regular astigmatism, bilateral: Secondary | ICD-10-CM | POA: Diagnosis not present

## 2023-11-04 ENCOUNTER — Ambulatory Visit (INDEPENDENT_AMBULATORY_CARE_PROVIDER_SITE_OTHER): Payer: Medicare PPO | Admitting: Vascular Surgery

## 2023-11-04 ENCOUNTER — Ambulatory Visit

## 2023-11-04 ENCOUNTER — Encounter (INDEPENDENT_AMBULATORY_CARE_PROVIDER_SITE_OTHER): Payer: Self-pay | Admitting: Vascular Surgery

## 2023-11-04 ENCOUNTER — Other Ambulatory Visit (INDEPENDENT_AMBULATORY_CARE_PROVIDER_SITE_OTHER): Payer: Medicare PPO

## 2023-11-04 VITALS — BP 106/61 | HR 60 | Ht 63.0 in | Wt 141.5 lb

## 2023-11-04 DIAGNOSIS — I739 Peripheral vascular disease, unspecified: Secondary | ICD-10-CM

## 2023-11-04 DIAGNOSIS — E782 Mixed hyperlipidemia: Secondary | ICD-10-CM

## 2023-11-04 DIAGNOSIS — R7303 Prediabetes: Secondary | ICD-10-CM | POA: Diagnosis not present

## 2023-11-04 DIAGNOSIS — I7300 Raynaud's syndrome without gangrene: Secondary | ICD-10-CM | POA: Diagnosis not present

## 2023-11-04 NOTE — Assessment & Plan Note (Signed)
 Symptom control has been excellent with avoidance of cold stimulation.

## 2023-11-04 NOTE — Progress Notes (Signed)
 MRN : [id number]  Alison Bradshaw is a 83 y.o. (Apr 11, 1940) female who presents with chief complaint of  Chief Complaint  Patient presents with   Encounter form: F/u 6 months  ABI  JD  .  History of Present Illness: Patient returns today in follow up of her cyanosis and pain in her feet.  She has had studies previously which showed fairly good flow and no interventions are.  A lot of her symptoms seem to be Raynaud's in nature.  Her noninvasive studies today show ABIs of 1.29 on the right and 1.31 on the left with triphasic waveforms and normal digital pressures.  Current Outpatient Medications  Medication Sig Dispense Refill   acetaminophen  (TYLENOL ) 500 MG tablet Take 1,000 mg by mouth every 6 (six) hours as needed.     albuterol  (PROVENTIL ) (2.5 MG/3ML) 0.083% nebulizer solution Take 3 mLs (2.5 mg total) by nebulization in the morning and at bedtime. 75 mL 11   albuterol  (VENTOLIN  HFA) 108 (90 Base) MCG/ACT inhaler Inhale 2 puffs into the lungs every 6 (six) hours as needed for shortness of breath. 3 each 0   ALPRAZolam  (XANAX ) 0.25 MG tablet TAKE 1 TABLET(0.25 MG) BY MOUTH AT BEDTIME AS NEEDED FOR ANXIETY 30 tablet 2   benzonatate  (TESSALON ) 100 MG capsule Take 1 capsule (100 mg total) by mouth 3 (three) times daily as needed for cough. 21 capsule 0   budesonide -formoterol  (SYMBICORT ) 160-4.5 MCG/ACT inhaler Inhale 2 puffs into the lungs in the morning and at bedtime. 1 each 12   cyclobenzaprine  (FLEXERIL ) 5 MG tablet Take 1 tablet (5 mg total) by mouth at bedtime. 14 tablet 1   fluticasone  (FLONASE ) 50 MCG/ACT nasal spray Place 1 spray into both nostrils in the morning and at bedtime. 100 mL 2   guaiFENesin -codeine  (VIRTUSSIN A/C) 100-10 MG/5ML syrup Take 5 mLs by mouth at bedtime and may repeat dose one time if needed. 150 mL 0   ipratropium (ATROVENT ) 0.03 % nasal spray Place 2 sprays into both nostrils every 12 (twelve) hours.     pantoprazole  (PROTONIX ) 40 MG tablet TAKE 1  TABLET(40 MG) BY MOUTH DAILY 90 tablet 3   rosuvastatin  (CRESTOR ) 10 MG tablet TAKE 1 TABLET(10 MG) BY MOUTH DAILY 90 tablet 1   sodium chloride  HYPERTONIC 3 % nebulizer solution Take by nebulization in the morning and at bedtime. 750 mL 12   Spacer/Aero-Holding Chambers (AEROCHAMBER MV) inhaler Use as instructed 1 each 0   sucralfate (CARAFATE) 1 G tablet Take 1 g by mouth as needed. Pt only takes when she's really needs it which isn't very often     No current facility-administered medications for this visit.    Past Medical History:  Diagnosis Date   Anxiety    Asthma    Atypical chest pain    Barrett's esophagus    Chronic gastritis    Diverticulosis    Duodenal ulcer    Gastritis    GERD (gastroesophageal reflux disease)    Hiatal hernia    History of echocardiogram    a. 08/2020 Echo: EF 60-65%, no rwma, Nl RV fxn, mild MR.   Hyperlipidemia    Non-obstructive CAD (coronary artery disease)    a. 10/2017 Stress Echo: Nl EF, no ischemia. Mild MR; b. 12/2017 Cardiac CTA: LM nl, LAD 50p, 30d, D1/2 nl, RI nl, LCX nl, OM1/2 nl, RCA nl, RPDA/RPL nl. Ca2+ score = 39 (46th percentile)-->statin dose escalated; c. 10/2021 MV: No ischemia/infarct. Nl LV fxn.  Osteopenia    Peptic ulcer, site unspecified, unspecified as acute or chronic, without hemorrhage or perforation 11/19/2012   Formatting of this note might be different from the original.  duodenum 1968  Formatting of this note might be different from the original.  Overview:   duodenum 1968   PSVT (paroxysmal supraventricular tachycardia) (HCC)    a. 10/2017 Holter: PSVT, max 5 beats->did not tolerate beta blocker; b. 09/2020 Zio: 38 runs of SVT. Longest 18.1 secs (106), fastest 203 bpm (6 beats). Occas PVCs (1.1%) - some assoc w/ triggered events; C. 11/2022 Zio: Predominantly sinus rhythm @ 69 (48-182).  34 runs of PSVT, longest 12 beats.  3.3% PVC burden.  No sustained arrhythmias or A-fib.   Pulmonary nodules    a. 12/2017 Cardiac CTA:  RLL 9mm nodule; b. 04/2018 RLL 9mm nodule resolved; c. 09/2018 CT Chest: Previously noted posterior RUL nodules almost completely resolved. Bandlike scarring of RML and lingula-->atyp infxn; d. 11/2022 CT Chest: Clustered bilateral solid pulmonary nodules and groundglass opacities, most pronounced in posterior RUL, RML, and lingula - ? chronic atyp infxn. New 6mm RLL nodule.   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   BREAST BIOPSY Left 2008   neg   BREAST EXCISIONAL BIOPSY Left 2004   neg   CATARACT EXTRACTION W/ INTRAOCULAR LENS  IMPLANT, BILATERAL Bilateral    COLONOSCOPY WITH PROPOFOL  N/A 01/22/2017   Procedure: COLONOSCOPY WITH PROPOFOL ;  Surgeon: Toledo, Ladell POUR, MD;  Location: [facility] ENDOSCOPY;  Service: Gastroenterology;  Laterality: N/A;   COLONOSCOPY WITH PROPOFOL  N/A 03/26/2022   Procedure: COLONOSCOPY WITH PROPOFOL ;  Surgeon: Maryruth Ole DASEN, MD;  Location: [facility] ENDOSCOPY;  Service: Endoscopy;  Laterality: N/A;   ESOPHAGOGASTRODUODENOSCOPY (EGD) WITH PROPOFOL  N/A 01/20/2015   Procedure: ESOPHAGOGASTRODUODENOSCOPY (EGD) WITH PROPOFOL ;  Surgeon: Gladis RAYMOND Mariner, MD;  Location: [facility] ENDOSCOPY;  Service: Endoscopy;  Laterality: N/A;   ESOPHAGOGASTRODUODENOSCOPY (EGD) WITH PROPOFOL  N/A 01/22/2017   Procedure: ESOPHAGOGASTRODUODENOSCOPY (EGD) WITH PROPOFOL ;  Surgeon: Toledo, Ladell POUR, MD;  Location: [facility] ENDOSCOPY;  Service: Gastroenterology;  Laterality: N/A;   ESOPHAGOGASTRODUODENOSCOPY (EGD) WITH PROPOFOL  N/A 04/14/2018   Procedure: ESOPHAGOGASTRODUODENOSCOPY (EGD) WITH PROPOFOL ;  Surgeon: Mariner Gladis RAYMOND, MD;  Location[facility] ENDOSCOPY;   Service: Endoscopy;  Laterality: N/A;   ESOPHAGOGASTRODUODENOSCOPY (EGD) WITH PROPOFOL  N/A 10/16/2021   Procedure: ESOPHAGOGASTRODUODENOSCOPY (EGD) WITH PROPOFOL ;  Surgeon: Maryruth Ole DASEN, MD;  Location: [facility] ENDOSCOPY;  Service: Endoscopy;  Laterality: N/A;   EYE SURGERY Right    laser scraping   OPEN REDUCTION INTERNAL FIXATION (ORIF) DISTAL RADIAL FRACTURE Right 02/09/2018   Procedure: OPEN REDUCTION INTERNAL FIXATION (ORIF) DISTAL RADIAL FRACTURE;  Surgeon: Kathlynn Sharper, MD;  Location: [facility] ORS;  Service: Orthopedics;  Laterality: Right;   TONSILLECTOMY       Social History   Tobacco Use   Smoking status: Never    Passive exposure: Past   Smokeless tobacco: Never   Tobacco comments:    Quit at age 31, smoked 1 pack a month   Vaping Use   Vaping status: Never Used  Substance Use Topics   Alcohol use: Yes    Alcohol/week: 2.0 standard drinks of  alcohol    Types: 2 Glasses of wine per week   Drug use: No      Family History  Problem Relation Age of Onset   Hyperlipidemia Mother    Alzheimer's disease Mother    Emphysema Father    AAA (abdominal aortic aneurysm) Father    Kidney Stones Father    Kidney cancer Brother    Pancreatic cancer Paternal Uncle    Diabetes Paternal Uncle    Stomach cancer Neg Hx    Colon cancer Neg Hx    Esophageal cancer Neg Hx      Allergies  Allergen Reactions   Percocet [Oxycodone -Acetaminophen ] Nausea Only   Ceftin [Cefuroxime Axetil] Hives and Rash   Timentin [Ticarcillin-Pot Clavulanate] Hives and Rash      REVIEW OF SYSTEMS (Negative unless checked)   Constitutional: [] Weight loss  [] Fever  [] Chills Cardiac: [] Chest pain   [] Chest pressure   [] Palpitations   [] Shortness of breath when laying flat   [] Shortness of breath at rest   [] Shortness of breath with exertion. Vascular:  [] Pain in legs with walking   [] Pain in legs at rest   [] Pain in legs when laying flat   [] Claudication   [] Pain in feet when walking  [] Pain in  feet at rest  [] Pain in feet when laying flat   [] History of DVT   [] Phlebitis   [] Swelling in legs   [] Varicose veins   [] Non-healing ulcers Pulmonary:   [] Uses home oxygen   [] Productive cough   [] Hemoptysis   [] Wheeze  [] COPD   [x] Asthma Neurologic:  [] Dizziness  [] Blackouts   [] Seizures   [] History of stroke   [] History of TIA  [] Aphasia   [] Temporary blindness   [] Dysphagia   [] Weakness or numbness in arms   [x] Weakness or numbness in legs Musculoskeletal:  [] Arthritis   [] Joint swelling   [] Joint pain   [] Low back pain Hematologic:  [] Easy bruising  [] Easy bleeding   [] Hypercoagulable state   [] Anemic  [] Hepatitis Gastrointestinal:  [] Blood in stool   [] Vomiting blood  [x] Gastroesophageal reflux/heartburn   [] Abdominal pain Genitourinary:  [] Chronic kidney disease   [] Difficult urination  [] Frequent urination  [] Burning with urination   [] Hematuria Skin:  [] Rashes   [] Ulcers   [] Wounds Psychological:  [x] History of anxiety   []  History of major depression.  Physical Examination  BP 106/61   Pulse 60   Ht 5' 3 (1.6 m)   Wt 141 lb 8 oz (64.2 kg)   BMI 25.07 kg/m  Gen:  WD/WN, NAD. Appears younger than stated age. Head: [place]/AT, No temporalis wasting. Ear/Nose/Throat: Hearing grossly intact, nares w/o erythema or drainage Eyes: Conjunctiva clear. Sclera non-icteric Neck: Supple.  Trachea midline Pulmonary:  Good air movement, no use of accessory muscles.  Cardiac: RRR, no JVD Vascular:  Vessel Right Left  Radial Palpable Palpable                          PT Palpable Palpable  DP Palpable Palpable   Gastrointestinal: soft, non-tender/non-distended. No guarding/reflex.  Musculoskeletal: M/S 5/5 throughout.  No deformity or atrophy.  Mild purplish discoloration of both feet and ankles.  No significant lower extremity edema. Neurologic: Sensation grossly intact in extremities.  Symmetrical.  Speech is fluent.  Psychiatric: Judgment intact, Mood & affect appropriate for pt's  clinical situation. Dermatologic: No rashes or ulcers noted.  No cellulitis or open wounds.      Labs Recent Results (from the past 2160  hours)  CBG monitoring, ED     Status: None   Collection Time: 08/25/23 11:23 AM  Result Value Ref Range   Glucose-Capillary 88 70 - 99 mg/dL    Comment: Glucose reference range applies only to samples taken after fasting for at least 8 hours.   Comment 1 Notify RN    Comment 2 Document in Chart   Protime-INR     Status: None   Collection Time: 08/25/23 11:25 AM  Result Value Ref Range   Prothrombin Time 13.6 11.4 - 15.2 seconds   INR 1.0 0.8 - 1.2    Comment: (NOTE) INR goal varies based on device and disease states. Performed at [facility], [address] [place], [place] [address]   APTT     Status: None   Collection Time: 08/25/23 11:25 AM  Result Value Ref Range   aPTT 30 24 - 36 seconds    Comment: Performed at [facility], [address] [place], [place] [address]  CBC     Status: None   Collection Time: 08/25/23 11:25 AM  Result Value Ref Range   WBC 4.3 4.0 - 10.5 K/uL   RBC 4.52 3.87 - 5.11 MIL/uL   Hemoglobin 13.4 12.0 - 15.0 g/dL   HCT 59.3 63.9 - 53.9 %   MCV 89.8 80.0 - 100.0 fL   MCH 29.6 26.0 - 34.0 pg   MCHC 33.0 30.0 - 36.0 g/dL   RDW 86.7 88.4 - 84.4 %   Platelets 206 150 - 400 K/uL   nRBC 0.0 0.0 - 0.2 %    Comment: Performed at [facility], [address] [place], [place] [address]  Differential     Status: None   Collection Time: 08/25/23 11:25 AM  Result Value Ref Range   Neutrophils Relative % 52 %   Neutro Abs 2.2 1.7 - 7.7 K/uL   Lymphocytes Relative 27 %   Lymphs Abs 1.2 0.7 - 4.0 K/uL   Monocytes Relative 16 %   Monocytes Absolute 0.7 0.1 - 1.0 K/uL   Eosinophils Relative 4 %   Eosinophils Absolute 0.2 0.0 - 0.5 K/uL   Basophils Relative 1 %   Basophils Absolute 0.1 0.0 - 0.1 K/uL   Immature Granulocytes 0 %   Abs Immature Granulocytes 0.01 0.00 - 0.07 K/uL     Comment: Performed at [facility], [address] [place], [place] [address]  Comprehensive metabolic panel     Status: None   Collection Time: 08/25/23 11:25 AM  Result Value Ref Range   Sodium 135 135 - 145 mmol/L   Potassium 3.9 3.5 - 5.1 mmol/L   Chloride 99 98 - 111 mmol/L   CO2 26 22 - 32 mmol/L   Glucose, Bld 81 70 - 99 mg/dL    Comment: Glucose reference range applies only to samples taken after fasting for at least 8 hours.   BUN 9 8 - 23 mg/dL   Creatinine, Ser 9.19 0.44 - 1.00 mg/dL   Calcium  8.9 8.9 - 10.3 mg/dL   Total Protein 6.8 6.5 - 8.1 g/dL   Albumin 4.0 3.5 - 5.0 g/dL   AST 30 15 - 41 U/L   ALT 17 0 - 44 U/L   Alkaline Phosphatase 67 38 - 126 U/L   Total Bilirubin 0.8 0.0 - 1.2 mg/dL   GFR, Estimated >39 >39 mL/min    Comment: (NOTE) Calculated using the CKD-EPI Creatinine Equation (2021)    Anion gap 10  5 - 15    Comment: Performed at [facility], [address] [place], [place] [address]  Ethanol     Status: None   Collection Time: 08/25/23 11:25 AM  Result Value Ref Range   Alcohol, Ethyl (B) <15 <15 mg/dL    Comment: Please note change in reference range. (NOTE) For medical purposes only. Performed at [facility], [address] [place], [place] [address]   Sedimentation rate     Status: None   Collection Time: 08/25/23 11:25 AM  Result Value Ref Range   Sed Rate 10 0 - 30 mm/hr    Comment: Performed at [facility], [address], [place], [place] [address]    Radiology No results found.  Assessment/Plan  PAD (peripheral artery disease) (HCC) Her noninvasive studies today show ABIs of 1.29 on the right and 1.31 on the left with triphasic waveforms and normal digital pressures.  Largely Raynaud's type symptoms.  Follow-up in 1 year.  No role for intervention.  Raynaud disease Symptom control has been excellent with avoidance of cold stimulation.  Mixed hyperlipidemia lipid control important in  reducing the progression of atherosclerotic disease. Continue statin therapy     Prediabetes blood glucose control important in reducing the progression of atherosclerotic disease. Also, involved in wound healing.  Selinda Gu, MD  11/04/2023 2:52 PM    This note was created with Dragon medical transcription system.  Any errors from dictation are purely unintentional

## 2023-11-04 NOTE — Assessment & Plan Note (Signed)
 Her noninvasive studies today show ABIs of 1.29 on the right and 1.31 on the left with triphasic waveforms and normal digital pressures.  Largely Raynaud's type symptoms.  Follow-up in 1 year.  No role for intervention.

## 2023-11-05 LAB — VAS US ABI WITH/WO TBI
Left ABI: 1.31
Right ABI: 1.29

## 2023-11-06 ENCOUNTER — Ambulatory Visit: Attending: Infectious Diseases | Admitting: Infectious Diseases

## 2023-11-06 ENCOUNTER — Encounter: Payer: Self-pay | Admitting: Infectious Diseases

## 2023-11-06 VITALS — BP 113/69 | HR 60 | Temp 97.3°F | Ht 63.0 in | Wt 141.0 lb

## 2023-11-06 DIAGNOSIS — J45909 Unspecified asthma, uncomplicated: Secondary | ICD-10-CM | POA: Diagnosis not present

## 2023-11-06 DIAGNOSIS — F419 Anxiety disorder, unspecified: Secondary | ICD-10-CM | POA: Insufficient documentation

## 2023-11-06 DIAGNOSIS — R490 Dysphonia: Secondary | ICD-10-CM | POA: Diagnosis not present

## 2023-11-06 DIAGNOSIS — J189 Pneumonia, unspecified organism: Secondary | ICD-10-CM | POA: Diagnosis not present

## 2023-11-06 DIAGNOSIS — Z8701 Personal history of pneumonia (recurrent): Secondary | ICD-10-CM | POA: Diagnosis not present

## 2023-11-06 DIAGNOSIS — R918 Other nonspecific abnormal finding of lung field: Secondary | ICD-10-CM | POA: Diagnosis not present

## 2023-11-06 DIAGNOSIS — Z79899 Other long term (current) drug therapy: Secondary | ICD-10-CM | POA: Insufficient documentation

## 2023-11-06 DIAGNOSIS — A310 Pulmonary mycobacterial infection: Secondary | ICD-10-CM | POA: Diagnosis not present

## 2023-11-06 DIAGNOSIS — K219 Gastro-esophageal reflux disease without esophagitis: Secondary | ICD-10-CM | POA: Insufficient documentation

## 2023-11-06 DIAGNOSIS — Z87891 Personal history of nicotine dependence: Secondary | ICD-10-CM | POA: Diagnosis not present

## 2023-11-06 DIAGNOSIS — E785 Hyperlipidemia, unspecified: Secondary | ICD-10-CM | POA: Diagnosis not present

## 2023-11-06 NOTE — Patient Instructions (Signed)
 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 We also talked about your seasonal allergies and gastroesophageal reflux disease (GERD).  YOUR PLAN:  -PULMONARY MYCOBACTERIUM AVIUM COMPLEX (MAC) INFECTION, FIBRO-NODULAR TYPE: This is a chronic lung infection caused by bacteria found in water and soil. Your recent CT scan shows minimal changes, and your current symptoms are mild. We will not start antibiotics due to potential side effects. Please discuss with Dr. Lesta about scheduling another CT scan before your November appointment. Will  Collect sputum samples if symptoms arise for MAC testing. Additionally, clean your shower head with bleach to reduce exposure to bacteria.  -SEASONAL ALLERGIC RHINITIS: This condition is caused by allergens like pollen and results in symptoms such as sneezing and throat clearing. Continue taking Claritin to relieve your symptoms. Consider discussing allergy testing with your primary care physician.  -GASTROESOPHAGEAL REFLUX DISEASE (GERD): GERD is a condition where stomach acid frequently flows back into the tube connecting your mouth and stomach, which can cause respiratory symptoms. Monitor your symptoms and discuss GERD management with your primary care physician.  INSTRUCTIONS:  Please follow up with Dr. Lesta to schedule another CT scan before your November appointment.. Discuss allergy testing and GERD management with your primary care physician.

## 2023-11-06 NOTE — Progress Notes (Signed)
 NAME: Alison Bradshaw  DOB: 1940/11/22  MRN: [id number]  Date/Time: 11/06/2023 9:23 AM   Subjective:    Follow up visit for MAC Last seen in May 2025 Since then she has had no cough but just sneezing and post nasal drip No fever or chills No fatigue   Alison Bradshaw is an 83 year old female with pulmonary nodules and recurrent pneumonia who presents for evaluation of atypical mycobacterial infection. She was referred by pulmonologist for further evaluation of MAI  As per patient She has a long-standing history of pulmonary nodules, monitored for over twenty years at [facility] . She experienced a severe bacterial pneumonia about twenty years ago and has had recurrent episodes since. Recently, she had a prolonged episode of cough lasting five months since 2024 , beginning with fever and chills, characterized by a 'racking cough' with minimal phlegm production, leading to a referral to a pulmonologist.  In the past year, she underwent two CT chest scans, one in Aug 2024 and another in feb 2025. In march 2025 three sputums were sent for AFB- 2 of the culture came positive for Mycobacterium avium , but smear negative  Currently, she experiences persistent hoarseness and 'gunk' in her throat, affecting her singing and speaking abilities. Despite these respiratory issues, she describes herself as generally healthy and active.  Her past medical history includes asthma, diagnosed following the recent prolonged cough episode, a history of whooping cough as a child, and a significant pneumonia episode twenty years ago.  Her current medications include an albuterol  inhaler, rosuvastatin  for cholesterol, pantoprazole  for GERD, and alprazolam  for anxiety. She has used azithromycin  and doxycycline  in the past for respiratory and eye issues, respectively.  No history of smoking, diabetes, high blood pressure, or heart issues. She lives alone following the passing of her husband four years ago after  fifty-one years of marriage  Past Medical History:  Diagnosis Date   Anxiety    Asthma    Atypical chest pain    Barrett's esophagus    Chronic gastritis    Diverticulosis    Duodenal ulcer    Gastritis    GERD (gastroesophageal reflux disease)    Hiatal hernia    History of echocardiogram    a. 08/2020 Echo: EF 60-65%, no rwma, Nl RV fxn, mild MR.   Hyperlipidemia    Non-obstructive CAD (coronary artery disease)    a. 10/2017 Stress Echo: Nl EF, no ischemia. Mild MR; b. 12/2017 Cardiac CTA: LM nl, LAD 50p, 30d, D1/2 nl, RI nl, LCX nl, OM1/2 nl, RCA nl, RPDA/RPL nl. Ca2+ score = 39 (46th percentile)-->statin dose escalated; c. 10/2021 MV: No ischemia/infarct. Nl LV fxn.   Osteopenia    Peptic ulcer, site unspecified, unspecified as acute or chronic, without hemorrhage or perforation 11/19/2012   Formatting of this note might be different from the original.  duodenum 1966  Formatting of this note might be different from the original.  Overview:   duodenum 1966   PSVT (paroxysmal supraventricular tachycardia) (HCC)    a. 10/2017 Holter: PSVT, max 5 beats->did not tolerate beta blocker; b. 09/2020 Zio: 38 runs of SVT. Longest 18.1 secs (106), fastest 203 bpm (6 beats). Occas PVCs (1.1%) - some assoc w/ triggered events; C. 11/2022 Zio: Predominantly sinus rhythm @ 69 (48-182).  34 runs of PSVT, longest 12 beats.  3.3% PVC burden.  No sustained arrhythmias or A-fib.   Pulmonary nodules    a. 12/2017 Cardiac CTA: RLL 9mm nodule; b. 04/2018 RLL  9mm nodule resolved; c. 09/2018 CT Chest: Previously noted posterior RUL nodules almost completely resolved. Bandlike scarring of RML and lingula-->atyp infxn; d. 11/2022 CT Chest: Clustered bilateral solid pulmonary nodules and groundglass opacities, most pronounced in posterior RUL, RML, and lingula - ? chronic atyp infxn. New 6mm RLL nodule.   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   BREAST BIOPSY Left 2008   neg   BREAST EXCISIONAL BIOPSY Left 2004   neg   CATARACT EXTRACTION W/ INTRAOCULAR LENS  IMPLANT, BILATERAL Bilateral    COLONOSCOPY WITH PROPOFOL  N/A 01/22/2017   Procedure: COLONOSCOPY WITH PROPOFOL ;  Surgeon: Toledo, Ladell POUR, MD;  Location: [facility] ENDOSCOPY;  Service: Gastroenterology;  Laterality: N/A;   COLONOSCOPY WITH PROPOFOL  N/A 03/26/2022   Procedure: COLONOSCOPY WITH PROPOFOL ;  Surgeon: Maryruth Ole DASEN, MD;  Location: [facility] ENDOSCOPY;  Service: Endoscopy;  Laterality: N/A;   ESOPHAGOGASTRODUODENOSCOPY (EGD) WITH PROPOFOL  N/A 01/20/2015   Procedure: ESOPHAGOGASTRODUODENOSCOPY (EGD) WITH PROPOFOL ;  Surgeon: Gladis RAYMOND Mariner, MD;  Location: [facility] ENDOSCOPY;  Service: Endoscopy;  Laterality: N/A;   ESOPHAGOGASTRODUODENOSCOPY (EGD) WITH PROPOFOL  N/A 01/22/2017   Procedure: ESOPHAGOGASTRODUODENOSCOPY (EGD) WITH PROPOFOL ;  Surgeon: Toledo, Ladell POUR, MD;  Location: [facility] ENDOSCOPY;  Service: Gastroenterology;  Laterality: N/A;   ESOPHAGOGASTRODUODENOSCOPY (EGD) WITH PROPOFOL  N/A 04/14/2018   Procedure: ESOPHAGOGASTRODUODENOSCOPY (EGD) WITH PROPOFOL ;  Surgeon: Mariner Gladis RAYMOND, MD;  Location: [facility] ENDOSCOPY;  Service: Endoscopy;  Laterality: N/A;   ESOPHAGOGASTRODUODENOSCOPY (EGD) WITH PROPOFOL  N/A 10/16/2021   Procedure: ESOPHAGOGASTRODUODENOSCOPY (EGD) WITH PROPOFOL ;  Surgeon: Maryruth Ole DASEN, MD;  Location: [facility] ENDOSCOPY;  Service: Endoscopy;  Laterality: N/A;   EYE SURGERY Right    laser scraping   OPEN REDUCTION INTERNAL FIXATION (ORIF) DISTAL RADIAL FRACTURE Right 02/09/2018   Procedure: OPEN REDUCTION INTERNAL FIXATION (ORIF) DISTAL RADIAL FRACTURE;  Surgeon: Kathlynn Sharper, MD;   Location: [facility] ORS;  Service: Orthopedics;  Laterality: Right;   TONSILLECTOMY      Social History   Socioeconomic History   Marital status: Widowed    Spouse name: Not on file   Number of children: 2   Years of education: Not on file   Highest education level: Not on file  Occupational History   Occupation: retired  Tobacco Use   Smoking status: Never    Passive exposure: Past   Smokeless tobacco: Never   Tobacco comments:    Quit at age 82, smoked 1 pack a month   Vaping Use   Vaping status: Never Used  Substance and Sexual Activity   Alcohol use: Yes    Alcohol/week: 2.0 standard drinks of alcohol    Types: 2 Glasses of wine per week   Drug use: No   Sexual activity: Not Currently    Partners: Male  Other Topics Concern   Not on file  Social History Narrative   no drug use no alcohol   Social Drivers of Corporate investment banker Strain: Low Risk  (02/05/2023)   Received from [facility]   Overall Financial Resource Strain (CARDIA)    Difficulty of Paying Living Expenses: Not hard at all  Food Insecurity: No Food Insecurity (  02/05/2023)   Received from [facility]   Hunger Vital Sign    Within the past 12 months, you worried that your food would run out before you got the money to buy more.: Never true    Within the past 12 months, the food you bought just didn't last and you didn't have money to get more.: Never true  Transportation Needs: No Transportation Needs (02/05/2023)   Received from [facility] - Transportation    In the past 12 months, has lack of transportation kept you from medical appointments or from getting medications?: No    Lack of Transportation (Non-Medical): No  Physical Activity: Sufficiently Active (10/07/2022)   Exercise Vital Sign    Days of Exercise per Week: 7 days    Minutes of Exercise per Session: 90 min  Stress: No Stress Concern Present (10/07/2022)   [employer] of Occupational [employer] - Occupational Stress Questionnaire    Feeling of Stress : Not at all  Social Connections: Moderately Integrated (10/07/2022)   Social Connection and Isolation Panel    Frequency of Communication with Friends and Family: More than three times a week    Frequency of Social Gatherings with Friends and Family: More than three times a week    Attends Religious Services: More than 4 times per year    Active Member of [employer] or Organizations: Yes    Attends Banker Meetings: More than 4 times per year    Marital Status: Widowed  Intimate Partner Violence: Not At Risk (10/07/2022)   Humiliation, Afraid, Rape, and Kick questionnaire    Fear of Current or Ex-Partner: No    Emotionally Abused: No    Physically Abused: No    Sexually Abused: No    Family History  Problem Relation Age of Onset   Hyperlipidemia Mother    Alzheimer's disease Mother    Emphysema Father    AAA (abdominal aortic aneurysm) Father    Kidney Stones Father    Kidney cancer Brother    Pancreatic cancer Paternal Uncle    Diabetes Paternal Uncle    Stomach cancer Neg Hx    Colon cancer Neg Hx    Esophageal cancer Neg Hx    Allergies  Allergen Reactions   Percocet [Oxycodone -Acetaminophen ] Nausea Only   Ceftin [Cefuroxime Axetil] Hives and Rash   Timentin [Ticarcillin-Pot Clavulanate] Hives and Rash   I? Current Outpatient Medications  Medication Sig Dispense Refill   acetaminophen  (TYLENOL ) 500 MG tablet Take 1,000 mg by mouth every 6 (six) hours as needed.     albuterol  (PROVENTIL ) (2.5 MG/3ML) 0.083% nebulizer solution Take 3 mLs (2.5 mg total) by nebulization in the morning and at bedtime. 75 mL 11   albuterol  (VENTOLIN  HFA) 108 (90 Base) MCG/ACT inhaler Inhale 2 puffs into the lungs every 6 (six) hours as needed for shortness of breath. 3 each 0   ALPRAZolam  (XANAX ) 0.25 MG tablet TAKE 1 TABLET(0.25 MG) BY MOUTH AT BEDTIME AS NEEDED FOR ANXIETY 30 tablet 2    benzonatate  (TESSALON ) 100 MG capsule Take 1 capsule (100 mg total) by mouth 3 (three) times daily as needed for cough. 21 capsule 0   budesonide -formoterol  (SYMBICORT ) 160-4.5 MCG/ACT inhaler Inhale 2 puffs into the lungs in the morning and at bedtime. 1 each 12   cyclobenzaprine  (FLEXERIL ) 5 MG tablet Take 1 tablet (5 mg total) by mouth at bedtime. 14 tablet 1   cycloSPORINE (RESTASIS) 0.05 % ophthalmic emulsion  Place 1 drop into both eyes 2 (two) times daily.     fluticasone  (FLONASE ) 50 MCG/ACT nasal spray Place 1 spray into both nostrils in the morning and at bedtime. 100 mL 2   guaiFENesin -codeine  (VIRTUSSIN A/C) 100-10 MG/5ML syrup Take 5 mLs by mouth at bedtime and may repeat dose one time if needed. 150 mL 0   ipratropium (ATROVENT ) 0.03 % nasal spray Place 2 sprays into both nostrils every 12 (twelve) hours.     pantoprazole  (PROTONIX ) 40 MG tablet TAKE 1 TABLET(40 MG) BY MOUTH DAILY 90 tablet 3   rosuvastatin  (CRESTOR ) 10 MG tablet TAKE 1 TABLET(10 MG) BY MOUTH DAILY 90 tablet 1   sodium chloride  HYPERTONIC 3 % nebulizer solution Take by nebulization in the morning and at bedtime. 750 mL 12   Spacer/Aero-Holding Chambers (AEROCHAMBER MV) inhaler Use as instructed 1 each 0   sucralfate (CARAFATE) 1 G tablet Take 1 g by mouth as needed. Pt only takes when she's really needs it which isn't very often     No current facility-administered medications for this visit.     Abtx:  Anti-infectives (From admission, onward)    None       REVIEW OF SYSTEMS:  Const: negative fever, negative chills, negative weight loss Eyes: negative diplopia or visual changes, negative eye pain ENT:as above Resp: as above Cards: negative for chest pain, palpitations, lower extremity edema GU: negative for frequency, dysuria and hematuria GI: Negative for abdominal pain, diarrhea, bleeding, constipation Skin: negative for rash and pruritus Heme: negative for easy bruising and gum/nose bleeding MS:  negative for myalgias, arthralgias, back pain and muscle weakness Neurolo:negative for headaches, dizziness, vertigo, memory problems  Psych:  anxiety  Endocrine: negative for thyroid , diabetes Allergy/Immunology- ceftin, timentin - rash ?  Objective:  VITALS:  BP 113/69   Pulse 60   Temp (!) 97.3 F (36.3 C) (Temporal)   Ht 5' 3 (1.6 m)   Wt 141 lb (64 kg)   SpO2 98%   BMI 24.98 kg/m   PHYSICAL EXAM:  General: Alert, cooperative, no distress, appears stated age.  Head: Normocephalic, without obvious abnormality, atraumatic. Eyes: Conjunctivae clear, anicteric sclerae. Pupils are equal ENT Nares normal. No drainage or sinus tenderness. Lips, mucosa, and tongue normal. No Thrush Neck: Supple, symmetrical, no adenopathy, thyroid : non tender no carotid bruit and no JVD. Back: No CVA tenderness. Lungs: Clear to auscultation bilaterally. No Wheezing or Rhonchi. No rales. Heart: Regular rate and rhythm, no murmur, rub or gallop. Abdomen: Soft, non-tender,not distended. Bowel sounds normal. No masses Extremities: atraumatic, no cyanosis. No edema. No clubbing Skin: No rashes or lesions. Or bruising Lymph: Cervical, supraclavicular normal. Neurologic: Grossly non-focal Pertinent Labs Lab Results CBC    Component Value Date/Time   WBC 4.3 08/25/2023 1125   RBC 4.52 08/25/2023 1125   HGB 13.4 08/25/2023 1125   HCT 40.6 08/25/2023 1125   PLT 206 08/25/2023 1125   MCV 89.8 08/25/2023 1125   MCH 29.6 08/25/2023 1125   MCHC 33.0 08/25/2023 1125   RDW 13.2 08/25/2023 1125   LYMPHSABS 1.2 08/25/2023 1125   MONOABS 0.7 08/25/2023 1125   EOSABS 0.2 08/25/2023 1125   BASOSABS 0.1 08/25/2023 1125       Latest Ref Rng & Units 08/25/2023   11:25 AM 03/18/2023    6:00 PM 11/07/2022    1:19 PM  CMP  Glucose 70 - 99 mg/dL 81  899  89   BUN 8 - 23 mg/dL 9  11  14   Creatinine 0.44 - 1.00 mg/dL 9.19  9.25  9.12   Sodium 135 - 145 mmol/L 135  137  137   Potassium 3.5 - 5.1 mmol/L  3.9  3.7  4.9   Chloride 98 - 111 mmol/L 99  101  100   CO2 22 - 32 mmol/L 26  28  29    Calcium  8.9 - 10.3 mg/dL 8.9  9.1  89.9   Total Protein 6.5 - 8.1 g/dL 6.8  7.1  7.4   Total Bilirubin 0.0 - 1.2 mg/dL 0.8  1.0  0.9   Alkaline Phos 38 - 126 U/L 67  72  77   AST 15 - 41 U/L 30  29  25    ALT 0 - 44 U/L 17  22  13        Microbiology: 07/02/23- sputum AFB culture 2/3 MAC   IMAGING RESULTS: Multifocal clustered nodularity- 2025 VS 2020-not much of a progression    I have personally reviewed the films  I discussed with Radiologist today and reviewed films from feb 2025, Aug 2024   ?small nodular lesions both lungs - not changed much since aug 2024   Impression/Recommendation ?Atypical Mycobacterial Lung Infection   Chronic  mycobacterium avium  lung infection presenting  with stable multiple small nodular lesions in the right and left apical regions. Sputum cultures X 2 confirmed MAC but three negative AFB smear r, indicating low bacterial burden. Symptoms of persistent cough has resolved . Discussed the risk and benefits of treating this infection Treatment with azithromycin , rifabutin ( or rifampin) , and ethambutol is complicated by potential side effects and her increased propensity to nausea and GI symptoms with meds Given the stable disease and minimal symptoms, observation is reasonable. . Discussed with  radiologist to assess lesion progression which is stable  Repeat  CT scan for bronchiectasis or fibrosis evaluation ( ? HRCT) . Will let  pulmonologist Dr.Assaker decide on the repeat CT   No treatment now  ? Do Immunoglobulin profile?  Asthma   Asthma diagnosed by  pulmonologist, . Managed with albuterol  inhaler as needed.  Pneumonia   History of recurrent pneumonia, with a significant episode 20 years ago, possibly contributing to current respiratory symptoms and infection susceptibility.  GERD   GERD is well-controlled with pantoprazole .  Anxiety   Anxiety  is well-controlled with alprazolam  at night to aid sleep. ?   Can repeat sputum for AFB if gets it again  ________________________________________________ Discussed with patient in detail Follow up 6 months

## 2023-11-10 ENCOUNTER — Ambulatory Visit: Admitting: *Deleted

## 2023-11-10 VITALS — Ht 63.0 in | Wt 140.0 lb

## 2023-11-10 DIAGNOSIS — Z0000 Encounter for general adult medical examination without abnormal findings: Secondary | ICD-10-CM | POA: Diagnosis not present

## 2023-11-10 NOTE — Progress Notes (Signed)
 Subjective:   Alison Bradshaw is a 83 y.o. who presents for a Medicare Wellness preventive visit.  As a reminder, Annual Wellness Visits don't include a physical exam, and some assessments may be limited, especially if this visit is performed virtually. We may recommend an in-person follow-up visit with your provider if needed.  Visit Complete: Virtual I connected with  Alison Bradshaw on 11/10/23 by a audio enabled telemedicine application and verified that I am speaking with the correct person using two identifiers.  Patient Location: Home  Provider Location: Home Office  I discussed the limitations of evaluation and management by telemedicine. The patient expressed understanding and agreed to proceed.  Vital Signs: Because this visit was a virtual/telehealth visit, some criteria may be missing or patient reported. Any vitals not documented were not able to be obtained and vitals that have been documented are patient reported.  VideoDeclined- This patient declined Librarian, academic. Therefore the visit was completed with audio only.  Persons Participating in Visit: Patient.  AWV Questionnaire: No: Patient Medicare AWV questionnaire was not completed prior to this visit.  Cardiac Risk Factors include: advanced age (>80men, >42 women);dyslipidemia     Objective:    Today's Vitals   11/10/23 1419  Weight: 140 lb (63.5 kg)  Height: 5' 3 (1.6 m)   Body mass index is 24.8 kg/m.     11/10/2023    2:38 PM 08/25/2023   11:20 AM 03/18/2023    2:03 PM 10/07/2022    3:39 PM 04/07/2022    2:29 PM 03/26/2022   11:32 AM 03/04/2022    7:13 PM  Advanced Directives  Does Patient Have a Medical Advance Directive? Yes No No Yes No Yes No  Type of Estate agent of [place];Living will   Healthcare Power of [place];Living will  Healthcare Power of [place];Living will   Copy of Healthcare Power of Attorney in Chart? No - copy  requested   No - copy requested  No - copy requested   Would patient like information on creating a medical advance directive?  No - Patient declined No - Patient declined        Current Medications (verified) Outpatient Encounter Medications as of 11/10/2023  Medication Sig   acetaminophen  (TYLENOL ) 500 MG tablet Take 1,000 mg by mouth every 6 (six) hours as needed.   albuterol  (PROVENTIL ) (2.5 MG/3ML) 0.083% nebulizer solution Take 3 mLs (2.5 mg total) by nebulization in the morning and at bedtime.   albuterol  (VENTOLIN  HFA) 108 (90 Base) MCG/ACT inhaler Inhale 2 puffs into the lungs every 6 (six) hours as needed for shortness of breath.   ALPRAZolam  (XANAX ) 0.25 MG tablet TAKE 1 TABLET(0.25 MG) BY MOUTH AT BEDTIME AS NEEDED FOR ANXIETY   benzonatate  (TESSALON ) 100 MG capsule Take 1 capsule (100 mg total) by mouth 3 (three) times daily as needed for cough.   budesonide -formoterol  (SYMBICORT ) 160-4.5 MCG/ACT inhaler Inhale 2 puffs into the lungs in the morning and at bedtime.   cetirizine (ZYRTEC) 10 MG tablet Take 10 mg by mouth daily as needed for allergies.   cyclobenzaprine  (FLEXERIL ) 5 MG tablet Take 1 tablet (5 mg total) by mouth at bedtime.   cycloSPORINE (RESTASIS) 0.05 % ophthalmic emulsion Place 1 drop into both eyes 2 (two) times daily.   ipratropium (ATROVENT ) 0.03 % nasal spray Place 2 sprays into both nostrils every 12 (twelve) hours.   pantoprazole  (PROTONIX ) 40 MG tablet TAKE 1 TABLET(40 MG) BY MOUTH DAILY  psyllium (HYDROCIL/METAMUCIL) 95 % PACK Take 1 packet by mouth every other day.   rosuvastatin  (CRESTOR ) 10 MG tablet TAKE 1 TABLET(10 MG) BY MOUTH DAILY   sodium chloride  HYPERTONIC 3 % nebulizer solution Take by nebulization in the morning and at bedtime.   Spacer/Aero-Holding Chambers (AEROCHAMBER MV) inhaler Use as instructed   sucralfate (CARAFATE) 1 G tablet Take 1 g by mouth as needed. Pt only takes when she's really needs it which isn't very often   fluticasone   (FLONASE ) 50 MCG/ACT nasal spray Place 1 spray into both nostrils in the morning and at bedtime. (Patient not taking: Reported on 11/10/2023)   guaiFENesin -codeine  (VIRTUSSIN A/C) 100-10 MG/5ML syrup Take 5 mLs by mouth at bedtime and may repeat dose one time if needed. (Patient not taking: Reported on 11/10/2023)   No facility-administered encounter medications on file as of 11/10/2023.    Allergies (verified) Percocet [oxycodone -acetaminophen ], Ceftin [cefuroxime axetil], and Timentin [ticarcillin-pot clavulanate]   History: Past Medical History:  Diagnosis Date   Anxiety    Asthma    Atypical chest pain    Barrett's esophagus    Chronic gastritis    Diverticulosis    Duodenal ulcer    Gastritis    GERD (gastroesophageal reflux disease)    Hiatal hernia    History of echocardiogram    a. 08/2020 Echo: EF 60-65%, no rwma, Nl RV fxn, mild MR.   Hyperlipidemia    Non-obstructive CAD (coronary artery disease)    a. 10/2017 Stress Echo: Nl EF, no ischemia. Mild MR; b. 12/2017 Cardiac CTA: LM nl, LAD 50p, 30d, D1/2 nl, RI nl, LCX nl, OM1/2 nl, RCA nl, RPDA/RPL nl. Ca2+ score = 39 (46th percentile)-->statin dose escalated; c. 10/2021 MV: No ischemia/infarct. Nl LV fxn.   Osteopenia    Peptic ulcer, site unspecified, unspecified as acute or chronic, without hemorrhage or perforation 11/19/2012   Formatting of this note might be different from the original.  duodenum 1966  Formatting of this note might be different from the original.  Overview:   duodenum 1966   PSVT (paroxysmal supraventricular tachycardia) (HCC)    a. 10/2017 Holter: PSVT, max 5 beats->did not tolerate beta blocker; b. 09/2020 Zio: 38 runs of SVT. Longest 18.1 secs (106), fastest 203 bpm (6 beats). Occas PVCs (1.1%) - some assoc w/ triggered events; C. 11/2022 Zio: Predominantly sinus rhythm @ 69 (48-182).  34 runs of PSVT, longest 12 beats.  3.3% PVC burden.  No sustained arrhythmias or A-fib.   Pulmonary nodules    a. 12/2017 Cardiac  CTA: RLL 9mm nodule; b. 04/2018 RLL 9mm nodule resolved; c. 09/2018 CT Chest: Previously noted posterior RUL nodules almost completely resolved. Bandlike scarring of RML and lingula-->atyp infxn; d. 11/2022 CT Chest: Clustered bilateral solid pulmonary nodules and groundglass opacities, most pronounced in posterior RUL, RML, and lingula - ? chronic atyp infxn. New 6mm RLL nodule.   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   BREAST BIOPSY Left 2008   neg   BREAST EXCISIONAL BIOPSY Left 2004   neg   CATARACT EXTRACTION W/ INTRAOCULAR LENS  IMPLANT, BILATERAL Bilateral    COLONOSCOPY  WITH PROPOFOL  N/A 01/22/2017   Procedure: COLONOSCOPY WITH PROPOFOL ;  Surgeon: Toledo, Ladell POUR, MD;  Location: [facility] ENDOSCOPY;  Service: Gastroenterology;  Laterality: N/A;   COLONOSCOPY WITH PROPOFOL  N/A 03/26/2022   Procedure: COLONOSCOPY WITH PROPOFOL ;  Surgeon: Maryruth Ole DASEN, MD;  Location: [facility] ENDOSCOPY;  Service: Endoscopy;  Laterality: N/A;   ESOPHAGOGASTRODUODENOSCOPY (EGD) WITH PROPOFOL  N/A 01/20/2015   Procedure: ESOPHAGOGASTRODUODENOSCOPY (EGD) WITH PROPOFOL ;  Surgeon: Gladis RAYMOND Mariner, MD;  Location[facility] ENDOSCOPY;  Service: Endoscopy;  Laterality: N/A;   ESOPHAGOGASTRODUODENOSCOPY (EGD) WITH PROPOFOL  N/A 01/22/2017   Procedure: ESOPHAGOGASTRODUODENOSCOPY (EGD) WITH PROPOFOL ;  Surgeon: Toledo, Ladell POUR, MD;  Location: [facility] ENDOSCOPY;  Service: Gastroenterology;  Laterality: N/A;   ESOPHAGOGASTRODUODENOSCOPY (EGD) WITH PROPOFOL  N/A 04/14/2018   Procedure: ESOPHAGOGASTRODUODENOSCOPY (EGD) WITH PROPOFOL ;  Surgeon: Mariner Gladis RAYMOND, MD;  Location: [facility] ENDOSCOPY;   Service: Endoscopy;  Laterality: N/A;   ESOPHAGOGASTRODUODENOSCOPY (EGD) WITH PROPOFOL  N/A 10/16/2021   Procedure: ESOPHAGOGASTRODUODENOSCOPY (EGD) WITH PROPOFOL ;  Surgeon: Maryruth Ole DASEN, MD;  Location: [facility] ENDOSCOPY;  Service: Endoscopy;  Laterality: N/A;   EYE SURGERY Right    laser scraping   OPEN REDUCTION INTERNAL FIXATION (ORIF) DISTAL RADIAL FRACTURE Right 02/09/2018   Procedure: OPEN REDUCTION INTERNAL FIXATION (ORIF) DISTAL RADIAL FRACTURE;  Surgeon: Kathlynn Sharper, MD;  Location: [facility] ORS;  Service: Orthopedics;  Laterality: Right;   TONSILLECTOMY     Family History  Problem Relation Age of Onset   Hyperlipidemia Mother    Alzheimer's disease Mother    Emphysema Father    AAA (abdominal aortic aneurysm) Father    Kidney Stones Father    Kidney cancer Brother    Pancreatic cancer Paternal Uncle    Diabetes Paternal Uncle    Stomach cancer Neg Hx    Colon cancer Neg Hx    Esophageal cancer Neg Hx    Social History   Socioeconomic History   Marital status: Widowed    Spouse name: Not on file   Number of children: 2   Years of education: Not on file   Highest education level: Not on file  Occupational History   Occupation: retired  Tobacco Use   Smoking status: Never    Passive exposure: Past   Smokeless tobacco: Never   Tobacco comments:    Quit at age 79, smoked 1 pack a month   Vaping Use   Vaping status: Never Used  Substance and Sexual Activity   Alcohol use: Yes    Alcohol/week: 2.0 standard drinks of alcohol    Types: 2 Glasses of wine per week   Drug use: No   Sexual activity: Not Currently    Partners: Male  Other Topics Concern   Not on file  Social History Narrative   no drug use no alcohol   Social Drivers of Corporate investment banker Strain: Low Risk  (11/10/2023)   Overall Financial Resource Strain (CARDIA)    Difficulty of Paying Living Expenses: Not hard at all  Food Insecurity: No Food Insecurity (11/10/2023)   Hunger Vital Sign     Worried About Running Out of Food in the Last Year: Never true    Ran Out of Food in the Last Year: Never true  Transportation Needs: No Transportation Needs (11/10/2023)   PRAPARE - Administrator, Civil Service (Medical): No    Lack of Transportation (Non-Medical): No  Physical Activity: Sufficiently Active (11/10/2023)   Exercise Vital Sign    Days of Exercise per Week: 7 days  Minutes of Exercise per Session: 80 min  Stress: No Stress Concern Present (11/10/2023)   [employer] of Occupational Health - Occupational Stress Questionnaire    Feeling of Stress: Only a little  Social Connections: Moderately Integrated (11/10/2023)   Social Connection and Isolation Panel    Frequency of Communication with Friends and Family: More than three times a week    Frequency of Social Gatherings with Friends and Family: More than three times a week    Attends Religious Services: More than 4 times per year    Active Member of [employer] or Organizations: Yes    Attends Banker Meetings: More than 4 times per year    Marital Status: Widowed    Tobacco Counseling Counseling given: Not Answered Tobacco comments: Quit at age 58, smoked 1 pack a month     Clinical Intake:  Pre-visit preparation completed: Yes  Pain : No/denies pain     BMI - recorded: 24.8 Nutritional Status: BMI 25 -29 Overweight Nutritional Risks: None Diabetes: No  No results found for: HGBA1C   How often do you need to have someone help you when you read instructions, pamphlets, or other written materials from your doctor or pharmacy?: 1 - Never  Interpreter Needed?: No  Information entered by :: R. Fumiye Lubben LPN   Activities of Daily Living     11/10/2023    2:22 PM  In your present state of health, do you have any difficulty performing the following activities:  Hearing? 0  Vision? 0  Difficulty concentrating or making decisions? 0  Walking or climbing stairs? 0  Dressing or bathing? 0   Doing errands, shopping? 0  Preparing Food and eating ? N  Using the Toilet? N  In the past six months, have you accidently leaked urine? N  Do you have problems with loss of bowel control? N  Managing your Medications? N  Managing your Finances? N  Housekeeping or managing your Housekeeping? N    Patient Care Team: Marylynn Verneita CROME, MD as PCP - General (Internal Medicine) Darron Deatrice LABOR, MD as PCP - Cardiology (Cardiology)  I have updated your Care Teams any recent Medical Services you may have received from other providers in the past year.     Assessment:   This is a routine wellness examination for Alison Bradshaw.  Hearing/Vision screen Hearing Screening - Comments:: No issues Vision Screening - Comments:: glasses   Goals Addressed             This Visit's Progress    Patient Stated       Wants to increase her strength and flexibility exercises        Depression Screen     11/10/2023    2:32 PM 07/10/2023   11:54 AM 04/17/2023   10:05 AM 01/22/2023    2:35 PM 12/16/2022   11:47 AM 10/07/2022    3:32 PM 08/29/2022   12:01 PM  PHQ 2/9 Scores  PHQ - 2 Score 0 0 0 0 0 2 0  PHQ- 9 Score 2 0    3 2    Fall Risk     11/10/2023    2:25 PM 07/10/2023   11:54 AM 04/17/2023   10:05 AM 01/22/2023    2:35 PM 12/16/2022   11:47 AM  Fall Risk   Falls in the past year? 0 0 0 0 0  Number falls in past yr: 0 0 0 0 0  Injury with Fall? 0 0 0 0  0  Risk for fall due to : No Fall Risks No Fall Risks No Fall Risks No Fall Risks No Fall Risks  Follow up Falls evaluation completed;Falls prevention discussed Falls evaluation completed Falls evaluation completed Falls evaluation completed Falls evaluation completed    MEDICARE RISK AT HOME:  Medicare Risk at Home Any stairs in or around the home?: Yes If so, are there any without handrails?: No Home free of loose throw rugs in walkways, pet beds, electrical cords, etc?: Yes Adequate lighting in your home to reduce risk of falls?: Yes Life  alert?: Yes Use of a cane, walker or w/c?: No Grab bars in the bathroom?: Yes Shower chair or bench in shower?: Yes Elevated toilet seat or a handicapped toilet?: Yes  TIMED UP AND GO:  Was the test performed?  No  Cognitive Function: 6CIT completed        11/10/2023    2:39 PM 10/07/2022    3:40 PM  6CIT Screen  What Year? 0 points 0 points  What month? 0 points 0 points  What time? 0 points 0 points  Count back from 20 0 points 0 points  Months in reverse 0 points 0 points  Repeat phrase 0 points 0 points  Total Score 0 points 0 points    Immunizations Immunization History  Administered Date(s) Administered   Fluad Quad(high Dose 65+) 04/04/2022   Influenza Inj Mdck Quad Pf 01/05/2018, 01/11/2021   Influenza, Seasonal, Injecte, Preservative Fre 12/15/2013   Influenza,inj,Quad PF,6+ Mos 01/04/2020   Influenza-Unspecified 12/07/2013   Moderna Sars-Covid-2 Vaccination 04/23/2019, 05/25/2019   Pneumococcal Conjugate-13 12/29/2015, 05/10/2016   Pneumococcal-Unspecified 07/07/2006   Tdap 04/08/2012   Zoster Recombinant(Shingrix) 01/20/2018, 05/04/2018, 10/21/2018   Zoster, Live 04/08/2009    Screening Tests Health Maintenance  Topic Date Due   DTaP/Tdap/Td (2 - Td or Tdap) 04/08/2022   Medicare Annual Wellness (AWV)  10/07/2023   INFLUENZA VACCINE  11/07/2023   Pneumococcal Vaccine: 50+ Years (2 of 2 - PPSV23, PCV20, or PCV21) 11/27/2023 (Originally 07/05/2016)   DEXA SCAN  Completed   Zoster Vaccines- Shingrix  Completed   Hepatitis B Vaccines  Aged Out   HPV VACCINES  Aged Out   Meningococcal B Vaccine  Aged Out   Colonoscopy  Discontinued   COVID-19 Vaccine  Discontinued    Health Maintenance  Health Maintenance Due  Topic Date Due   DTaP/Tdap/Td (2 - Td or Tdap) 04/08/2022   Medicare Annual Wellness (AWV)  10/07/2023   INFLUENZA VACCINE  11/07/2023   Health Maintenance Items Addressed: Discussed the need to update tetanus vaccine Patient stated that she  had a reaction to covid vaccine and unable to take it.  Reminded patient to update flu shot and pneumonia as recommended.  Additional Screening:  Vision Screening: Recommended annual ophthalmology exams for early detection of glaucoma and other disorders of the eye. Up to date   [facility] Would you like a referral to an eye doctor? No    Dental Screening: Recommended annual dental exams for proper oral hygiene  Community Resource Referral / Chronic Care Management: CRR required this visit?  No   CCM required this visit?  No   Plan:    I have personally reviewed and noted the following in the patient's chart:   Medical and social history Use of alcohol, tobacco or illicit drugs  Current medications and supplements including opioid prescriptions. Patient is not currently taking opioid prescriptions. Functional ability and status Nutritional status Physical activity Advanced directives  List of other physicians Hospitalizations, surgeries, and ER visits in previous 12 months Vitals Screenings to include cognitive, depression, and falls Referrals and appointments  In addition, I have reviewed and discussed with patient certain preventive protocols, quality metrics, and best practice recommendations. A written personalized care plan for preventive services as well as general preventive health recommendations were provided to patient.   Angeline Fredericks, LPN   04/12/7972   After Visit Summary: (MyChart) Due to this being a telephonic visit, the after visit summary with patients personalized plan was offered to patient via MyChart   Notes: Nothing significant to report at this time.

## 2023-11-10 NOTE — Patient Instructions (Signed)
 Alison Bradshaw , Thank you for taking time out of your busy schedule to complete your Annual Wellness Visit with me. I enjoyed our conversation and look forward to speaking with you again next year. I, as well as your care team,  appreciate your ongoing commitment to your health goals. Please review the following plan we discussed and let me know if I can assist you in the future. Your Game plan/ To Do List    Referrals: If you haven't heard from the office you've been referred to, please reach out to them at the phone provided.  Remember to update your flu vaccine annually and get your Tetanus vaccine at your pharmacy.   Follow up Visits: We will see or speak with you next year for your Next Medicare AWV with our clinical staff 11/10/24 @ 2:20 Have you seen your provider in the last 6 months (3 months if uncontrolled diabetes)? Yes  Clinician Recommendations:  Aim for 30 minutes of exercise or brisk walking, 6-8 glasses of water, and 5 servings of fruits and vegetables each day.       This is a list of the screenings recommended for you:  Health Maintenance  Topic Date Due   DTaP/Tdap/Td vaccine (2 - Td or Tdap) 04/08/2022   Flu Shot  11/07/2023   Pneumococcal Vaccine for age over 53 (2 of 2 - PPSV23, PCV20, or PCV21) 11/27/2023*   Mammogram  05/22/2024   Medicare Annual Wellness Visit  11/09/2024   DEXA scan (bone density measurement)  Completed   Zoster (Shingles) Vaccine  Completed   Hepatitis B Vaccine  Aged Out   HPV Vaccine  Aged Out   Meningitis B Vaccine  Aged Out   Colon Cancer Screening  Discontinued   COVID-19 Vaccine  Discontinued  *Topic was postponed. The date shown is not the original due date.    Advanced directives: (Copy Requested) Please bring a copy of your health care power of attorney and living will to the office to be added to your chart at your convenience. You can mail to [facility] [address] [place], [place] [address] or email to  [email or web address] Advance Care Planning is important because it:  [x]  Makes sure you receive the medical care that is consistent with your values, goals, and preferences  [x]  It provides guidance to your family and loved ones and reduces their decisional burden about whether or not they are making the right decisions based on your wishes.

## 2023-11-12 DIAGNOSIS — D1801 Hemangioma of skin and subcutaneous tissue: Secondary | ICD-10-CM | POA: Diagnosis not present

## 2023-11-12 DIAGNOSIS — L820 Inflamed seborrheic keratosis: Secondary | ICD-10-CM | POA: Diagnosis not present

## 2023-11-12 DIAGNOSIS — Z85828 Personal history of other malignant neoplasm of skin: Secondary | ICD-10-CM | POA: Diagnosis not present

## 2023-11-12 DIAGNOSIS — Z872 Personal history of diseases of the skin and subcutaneous tissue: Secondary | ICD-10-CM | POA: Diagnosis not present

## 2023-11-12 DIAGNOSIS — L308 Other specified dermatitis: Secondary | ICD-10-CM | POA: Diagnosis not present

## 2023-11-12 DIAGNOSIS — L2989 Other pruritus: Secondary | ICD-10-CM | POA: Diagnosis not present

## 2023-11-12 DIAGNOSIS — D2339 Other benign neoplasm of skin of other parts of face: Secondary | ICD-10-CM | POA: Diagnosis not present

## 2023-11-12 DIAGNOSIS — Z08 Encounter for follow-up examination after completed treatment for malignant neoplasm: Secondary | ICD-10-CM | POA: Diagnosis not present

## 2023-11-12 DIAGNOSIS — D485 Neoplasm of uncertain behavior of skin: Secondary | ICD-10-CM | POA: Diagnosis not present

## 2023-11-12 DIAGNOSIS — L821 Other seborrheic keratosis: Secondary | ICD-10-CM | POA: Diagnosis not present

## 2023-11-15 DIAGNOSIS — J45909 Unspecified asthma, uncomplicated: Secondary | ICD-10-CM | POA: Diagnosis not present

## 2023-11-15 DIAGNOSIS — J45998 Other asthma: Secondary | ICD-10-CM | POA: Diagnosis not present

## 2023-11-18 DIAGNOSIS — H6121 Impacted cerumen, right ear: Secondary | ICD-10-CM | POA: Diagnosis not present

## 2023-11-18 DIAGNOSIS — H6061 Unspecified chronic otitis externa, right ear: Secondary | ICD-10-CM | POA: Diagnosis not present

## 2023-11-25 ENCOUNTER — Ambulatory Visit: Admitting: Internal Medicine

## 2023-11-25 DIAGNOSIS — E782 Mixed hyperlipidemia: Secondary | ICD-10-CM

## 2023-11-25 DIAGNOSIS — Z1231 Encounter for screening mammogram for malignant neoplasm of breast: Secondary | ICD-10-CM

## 2023-11-25 DIAGNOSIS — R5383 Other fatigue: Secondary | ICD-10-CM

## 2023-11-25 DIAGNOSIS — R7303 Prediabetes: Secondary | ICD-10-CM

## 2023-11-26 ENCOUNTER — Telehealth: Payer: Self-pay

## 2023-11-26 NOTE — Telephone Encounter (Signed)
 FYI

## 2023-11-26 NOTE — Telephone Encounter (Signed)
 Copied from CRM [id number]. Topic: Appointments - Scheduling Inquiry for Clinic >> Nov 26, 2023  8:47 AM [name] wrote: Reason for CRM: Pt states she wanted to apologize for missing the appt yesterday 8/19. States she had her calendar turned to September on accident and missed the appt. She does have one in September, but missed the appt for August because she was on the wrong month. Just wanted us  to know.

## 2023-11-27 DIAGNOSIS — H6063 Unspecified chronic otitis externa, bilateral: Secondary | ICD-10-CM | POA: Diagnosis not present

## 2023-11-27 DIAGNOSIS — H6121 Impacted cerumen, right ear: Secondary | ICD-10-CM | POA: Diagnosis not present

## 2023-11-27 DIAGNOSIS — J300 Vasomotor rhinitis: Secondary | ICD-10-CM | POA: Diagnosis not present

## 2023-11-28 ENCOUNTER — Encounter: Admitting: Internal Medicine

## 2023-12-04 ENCOUNTER — Ambulatory Visit: Payer: Self-pay | Admitting: Pulmonary Disease

## 2023-12-04 ENCOUNTER — Telehealth: Payer: Self-pay

## 2023-12-04 NOTE — Telephone Encounter (Signed)
 Copied from CRM #[id number]. 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 Provided her with the infectious disease doctor and pulmonologist phone number because [place]

## 2023-12-04 NOTE — Telephone Encounter (Signed)
 See triage note.

## 2023-12-04 NOTE — Telephone Encounter (Signed)
 PULMONARY FYI Only or Action Required?: Action required by provider: Refusing disposition, requesting meds if appropriate, needs call back with further recommendations, out of town for family emergency until at least 9/5 (can use [facility] at [address], [place], [place]  [address]), scheduled next available pt would accept at [place] office for 10/23.  Patient is followed in Pulmonology for asthma and MAC, last seen on 08/26/2023 by Malka Domino, MD.  Called Nurse Triage reporting Cough, post-nasal drip, and chest and throat congestion.  Symptoms began a week ago.  Interventions attempted: Rescue inhaler and Increased fluids/rest.  Symptoms are: gradually worsening.  Triage Disposition: See HCP Within 4 Hours (Or PCP Triage)  Patient/caregiver understands and will follow disposition?: Unsure  PRIMARY FYI Only or Action Required?: Action required by provider: Refusing disposition, needs call back with further recommendations, out of town for family emergency until at least 9/5.  Patient was last seen in primary care on 07/10/2023 by Vincente Saber, NP.  Called Nurse Triage reporting Cough, post-nasal drip, and chest and throat congestion.  Symptoms began a week ago.  Interventions attempted: Prescription medications: rescue inhaler and Rest, hydration, or home remedies.  Symptoms are: gradually worsening.  Triage Disposition: See HCP Within 4 Hours (Or PCP Triage)  Patient/caregiver understands and will follow disposition?: Unsure       Message from Isabell A sent at 12/04/2023  1:16 PM EDT  Reason for Triage: Patient states she is having reocurring symptoms of MAC - started a week ago before she got on the plane to [place] . Experiencing coughing & gunk coming up her throat, nasal drip. Requesting an antibiotic before it goes into pneumonia.   Callback number: [phone]   Reason for Disposition  [1] Longstanding difficulty breathing (e.g., CHF, COPD,  emphysema) AND [2] WORSE than normal  Answer Assessment - Initial Assessment Questions E2C2 Pulmonary Triage - Initial Assessment Questions Chief Complaint (e.g., cough, sob, wheezing, fever, chills, sweat or additional symptoms) *Go to specific symptom protocol after initial questions. Was just preparing to take covid test  Cough moderate today, seems to be response to all the gunk that happens with the MAC Sputum not been able to tell color, usually clear throat and swallow, gonna try to get it up to assess, pt reports creamy towards yellow color No blood in sputum SOB Yeah also characteristic of this attack or syndrome prone too, more SOB, with exertion and at rest No fever Achey low-grade, body aches No chest pain, dizziness, weakness Runny nose started 3 weeks ago, ear doc suggested atrovent  for that, been using that to help with nasal rhinitis Gaggy and clearing throat all the time, just full of this mess 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 A week ago  Have you used your inhalers/maintenance medication? Yes If yes, What medications? Haven't needed rescue inhaler in long while but took albuterol  last night and this morning, helped me fall asleep and get away from symptoms for a bit  OXYGEN: Do you wear supplemental oxygen? No  Do you monitor your oxygen levels? No but all recently 98-99%      7. CARDIAC HISTORY: Do you have any history of heart disease? (e.g., heart attack, congestive heart failure)      significant 8. LUNG HISTORY: Do you have any history of lung disease?  (e.g., pulmonary embolus, asthma, emphysema)     MAC, significant 9.  PE RISK FACTORS: Do you have a history of blood clots? (or: recent major surgery, recent prolonged travel, bedridden)     no   Currently out of town with family for brother getting surgery done,  flying back on 9/5 unless condition for brother worsens then I'll extend trip   Advised pt be examined in next 4 hours with symptoms at [facility] or especially ED if any worsening, advised that nurse sending message to pulm and PCP for awareness. Pt requesting meds from pulm if appropriate. Advised call back or seek immediate care if worsening. Scheduled earliest available with preferred location that worked with pt schedule per pt request, scheduled for 10/23. Pt stated understanding of nurse advice to be examined shortly while out of town, unsure if will go, stated understanding to seek immediate care if any worsening.   Can use [facility] [address], [place], [place]  [address]  Protocols used: Cough - Acute Productive-A-AH, Breathing Difficulty-A-AH

## 2023-12-05 ENCOUNTER — Telehealth: Payer: Self-pay | Admitting: Pulmonary Disease

## 2023-12-05 DIAGNOSIS — Z20828 Contact with and (suspected) exposure to other viral communicable diseases: Secondary | ICD-10-CM | POA: Diagnosis not present

## 2023-12-05 DIAGNOSIS — J45901 Unspecified asthma with (acute) exacerbation: Secondary | ICD-10-CM

## 2023-12-05 DIAGNOSIS — A312 Disseminated mycobacterium avium-intracellulare complex (DMAC): Secondary | ICD-10-CM | POA: Diagnosis not present

## 2023-12-05 DIAGNOSIS — U071 COVID-19: Secondary | ICD-10-CM | POA: Diagnosis not present

## 2023-12-05 NOTE — Telephone Encounter (Signed)
 Per Dr. Malka, he spoke with the patient over the phone.  Nothing further needed.

## 2023-12-05 NOTE — Telephone Encounter (Signed)
 noted

## 2023-12-10 NOTE — Telephone Encounter (Signed)
Telephone encounter created by error.

## 2023-12-11 ENCOUNTER — Ambulatory Visit: Admitting: Pulmonary Disease

## 2023-12-16 DIAGNOSIS — J45998 Other asthma: Secondary | ICD-10-CM | POA: Diagnosis not present

## 2023-12-16 DIAGNOSIS — J45909 Unspecified asthma, uncomplicated: Secondary | ICD-10-CM | POA: Diagnosis not present

## 2023-12-24 ENCOUNTER — Other Ambulatory Visit: Payer: Self-pay

## 2023-12-24 MED ORDER — ROSUVASTATIN CALCIUM 10 MG PO TABS: ORAL_TABLET | ORAL | 0 refills | Status: DC

## 2023-12-25 ENCOUNTER — Other Ambulatory Visit: Payer: Self-pay | Admitting: Internal Medicine

## 2023-12-26 NOTE — Telephone Encounter (Signed)
 Requesting: Xanax  Contract: No UDS: N/A Last Visit: 01/22/2023 Next Visit: 12/30/2023 (With Vincente), LOV with Dr Marylynn was 01/22/2023 Last Refill: 11/24/2023   Please Advise   Note; I also sent pt a mychart message requesting that they keep the scheduled appt on 9/23 with PCP. Last 2 appt were cancelled or a no show in August.

## 2023-12-29 DIAGNOSIS — J019 Acute sinusitis, unspecified: Secondary | ICD-10-CM | POA: Diagnosis not present

## 2023-12-29 DIAGNOSIS — R208 Other disturbances of skin sensation: Secondary | ICD-10-CM | POA: Diagnosis not present

## 2023-12-29 DIAGNOSIS — R5383 Other fatigue: Secondary | ICD-10-CM | POA: Diagnosis not present

## 2023-12-29 DIAGNOSIS — B9689 Other specified bacterial agents as the cause of diseases classified elsewhere: Secondary | ICD-10-CM | POA: Diagnosis not present

## 2023-12-29 DIAGNOSIS — R0600 Dyspnea, unspecified: Secondary | ICD-10-CM | POA: Diagnosis not present

## 2023-12-29 DIAGNOSIS — R6883 Chills (without fever): Secondary | ICD-10-CM | POA: Diagnosis not present

## 2023-12-29 DIAGNOSIS — R5381 Other malaise: Secondary | ICD-10-CM | POA: Diagnosis not present

## 2023-12-29 DIAGNOSIS — L538 Other specified erythematous conditions: Secondary | ICD-10-CM | POA: Diagnosis not present

## 2023-12-29 DIAGNOSIS — J209 Acute bronchitis, unspecified: Secondary | ICD-10-CM | POA: Diagnosis not present

## 2023-12-29 DIAGNOSIS — L739 Follicular disorder, unspecified: Secondary | ICD-10-CM | POA: Diagnosis not present

## 2023-12-29 DIAGNOSIS — L820 Inflamed seborrheic keratosis: Secondary | ICD-10-CM | POA: Diagnosis not present

## 2023-12-29 DIAGNOSIS — Z03818 Encounter for observation for suspected exposure to other biological agents ruled out: Secondary | ICD-10-CM | POA: Diagnosis not present

## 2023-12-30 ENCOUNTER — Ambulatory Visit (INDEPENDENT_AMBULATORY_CARE_PROVIDER_SITE_OTHER): Admitting: Internal Medicine

## 2023-12-30 ENCOUNTER — Encounter: Payer: Self-pay | Admitting: Internal Medicine

## 2023-12-30 VITALS — BP 118/66 | HR 79 | Ht 63.0 in | Wt 143.6 lb

## 2023-12-30 DIAGNOSIS — F5104 Psychophysiologic insomnia: Secondary | ICD-10-CM | POA: Diagnosis not present

## 2023-12-30 DIAGNOSIS — R42 Dizziness and giddiness: Secondary | ICD-10-CM | POA: Diagnosis not present

## 2023-12-30 DIAGNOSIS — Z1231 Encounter for screening mammogram for malignant neoplasm of breast: Secondary | ICD-10-CM

## 2023-12-30 DIAGNOSIS — E782 Mixed hyperlipidemia: Secondary | ICD-10-CM

## 2023-12-30 DIAGNOSIS — Z7189 Other specified counseling: Secondary | ICD-10-CM | POA: Diagnosis not present

## 2023-12-30 DIAGNOSIS — Z0000 Encounter for general adult medical examination without abnormal findings: Secondary | ICD-10-CM

## 2023-12-30 DIAGNOSIS — R7303 Prediabetes: Secondary | ICD-10-CM | POA: Diagnosis not present

## 2023-12-30 DIAGNOSIS — D7289 Other specified disorders of white blood cells: Secondary | ICD-10-CM | POA: Diagnosis not present

## 2023-12-30 DIAGNOSIS — I471 Supraventricular tachycardia, unspecified: Secondary | ICD-10-CM

## 2023-12-30 DIAGNOSIS — Z79899 Other long term (current) drug therapy: Secondary | ICD-10-CM

## 2023-12-30 DIAGNOSIS — T50905A Adverse effect of unspecified drugs, medicaments and biological substances, initial encounter: Secondary | ICD-10-CM

## 2023-12-30 MED ORDER — ROSUVASTATIN CALCIUM 10 MG PO TABS: ORAL_TABLET | ORAL | 1 refills | Status: DC

## 2023-12-30 MED ORDER — ALPRAZOLAM 0.25 MG PO TABS: 0.2500 mg | ORAL_TABLET | Freq: Two times a day (BID) | ORAL | 5 refills | Status: AC | PRN

## 2023-12-30 NOTE — Patient Instructions (Addendum)
 PLEASE RETURN FOR YOUR PNEUMONIA VACCINE IN 2 WEEKS   PLEASE GET YOUR FLU VACCINE IN 4 WEEKS

## 2023-12-30 NOTE — Progress Notes (Unsigned)
 Patient ID: Alison Bradshaw, female    DOB: 1940-07-12  Age: 83 y.o. MRN: [id number]  The patient is here for annual preventive  examination and management of other chronic and acute problems.   The risk factors are reflected in the social history.  The roster of all physicians providing medical Bradshaw to patient - is listed in the Snapshot section of the chart.  Activities of daily living:  The patient is 100% independent in all ADLs: dressing, toileting, feeding as well as independent mobility  Home safety : The patient has smoke detectors in the home. They wear seatbelts.  There are no firearms at home. There is no violence in the home.   There is no risks for hepatitis, STDs or HIV. There is no   history of blood transfusion. They have no travel history to infectious disease endemic areas of the world.  The patient has seen their dentist in the last six month. They have seen their eye doctor in the last year. They admit to slight hearing difficulty with regard to whispered voices and some television programs.  They have deferred audiologic testing in the last year.  They do not  have excessive sun exposure. Discussed the need for sun protection: hats, long sleeves and use of sunscreen if there is significant sun exposure.   Diet: the importance of a healthy diet is discussed. They do have a healthy diet.  The benefits of regular aerobic exercise were discussed. She walks 4 times per week ,  20 minutes.   Depression screen: there are no signs or vegative symptoms of depression- irritability, change in appetite, anhedonia, sadness/tearfullness.  Cognitive assessment: the patient manages all their financial and personal affairs and is actively engaged. They could relate day,date,year and events; recalled 2/3 objects at 3 minutes; performed clock-face test normally.  The following portions of the patient's history were reviewed and updated as appropriate: allergies, current medications, past  family history, past medical history,  past surgical history, past social history  and problem list.  Visual acuity was not assessed per patient preference since she has regular follow up with her ophthalmologist. Hearing and body mass index were assessed and reviewed.   During the course of the visit the patient was educated and counseled about appropriate screening and preventive services including : fall prevention , diabetes screening, nutrition counseling, colorectal cancer screening, and recommended immunizations.    CC: The primary encounter diagnosis was Mixed hyperlipidemia. Diagnoses of Prediabetes, Long-term use of high-risk medication, and Encounter for screening mammogram for malignant neoplasm of breast were also pertinent to this visit.  1) sinusitis:  treatment started yesterday by Alison Bradshaw with ZPak  and prednisone  ne 20 mg daily x 5.  NOT COVID TESTED .  SYMPTOMS WERE SHORTNESS OF BREATH WITH exertion (GOLFING ) .  Has chronic PND , which is aggrvated by symbicort  prescribed for asthma because it causes her to be hoarse and increasing throat congestion. Has asthma and MAC ,  seeing [facility]ious Disease    2) continues to have intermittent episodes of sharp flank pain followed by pencil thin stools that resolve.    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   She has a past surgical history that includes Tonsillectomy; Eye surgery (Right); Esophagogastroduodenoscopy (egd) with propofol  (N/A, 01/20/2015); Breast biopsy (Left, 2008); Breast excisional biopsy (Left, 2004); Colonoscopy with propofol  (N/A, 01/22/2017); Esophagogastroduodenoscopy (egd) with propofol  (N/A, 01/22/2017); Cataract extraction w/ intraocular lens  implant, bilateral (Bilateral); Open reduction internal fixation (orif) distal radial fracture (Right, 02/09/2018); Esophagogastroduodenoscopy (egd) with propofol  (N/A, 04/14/2018); Esophagogastroduodenoscopy (egd) with propofol  (N/A, 10/16/2021); Colonoscopy with propofol  (N/A, 03/26/2022); and 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   Her family history includes AAA (abdominal aortic aneurysm) in her father; Alzheimer's disease in her mother; Diabetes in her paternal uncle; Emphysema in her father; Hyperlipidemia in her mother; Kidney Stones in her father; Kidney cancer in her brother; Pancreatic cancer in her paternal uncle.She reports that she has never smoked. She has been exposed to tobacco smoke. She has never used smokeless tobacco. She reports current alcohol use of about 2.0 standard drinks of alcohol per week. She reports that she does not use drugs.  Outpatient Medications Prior to Visit  Medication Sig Dispense Refill   acetaminophen  (TYLENOL ) 500 MG tablet Take 1,000 mg by mouth every 6 (six) hours as needed.     albuterol  (PROVENTIL ) (2.5 MG/3ML) 0.083% nebulizer solution Take 3 mLs (2.5 mg total) by nebulization in the morning and at bedtime. 75 mL 11   albuterol  (VENTOLIN  HFA) 108 (90 Base) MCG/ACT inhaler Inhale 2 puffs into the lungs every 6 (six) hours as needed for shortness of breath. 3 each 0   ALPRAZolam  (XANAX ) 0.25 MG tablet TAKE 1 TABLET(0.25 MG) BY MOUTH AT BEDTIME AS NEEDED FOR ANXIETY 30 tablet 0   azithromycin  (ZITHROMAX ) 250 MG tablet Two tabs today, then  one daily     benzonatate  (TESSALON ) 100 MG capsule Take 1 capsule (100 mg total) by mouth 3 (three) times daily as needed for cough. 21 capsule 0   cetirizine (ZYRTEC) 10 MG tablet Take 10 mg by mouth daily as needed for allergies.     clindamycin  (CLEOCIN  T) 1 % lotion Apply topically.     cyclobenzaprine  (FLEXERIL ) 5 MG tablet Take 1 tablet (5 mg total) by mouth at bedtime. (Patient taking differently: Take 5 mg by mouth as needed.) 14 tablet 1   cycloSPORINE (RESTASIS) 0.05 % ophthalmic emulsion Place 1 drop into both eyes 2 (two) times daily.     fluticasone  (FLONASE ) 50 MCG/ACT nasal spray Place 1 spray into both nostrils in the morning and at bedtime. 100 mL 2   guaiFENesin -codeine  (VIRTUSSIN A/C) 100-10 MG/5ML syrup Take 5 mLs by mouth at bedtime and may repeat dose one time if needed. 150 mL 0   mometasone (ELOCON) 0.1 % lotion      pantoprazole  (PROTONIX ) 40 MG tablet TAKE 1 TABLET(40 MG) BY MOUTH DAILY 90 tablet 3   predniSONE  (DELTASONE ) 20 MG tablet Take 20 mg by mouth 2 (two) times daily with a meal.     rosuvastatin  (CRESTOR ) 10 MG tablet TAKE 1 TABLET(10 MG) BY MOUTH DAILY 30 tablet 0   sucralfate (CARAFATE) 1 G tablet Take 1 g by mouth as needed. Pt only takes when she's really needs it which isn't very often     budesonide -formoterol  (SYMBICORT ) 160-4.5 MCG/ACT inhaler Inhale 2 puffs into the lungs in the morning and at bedtime. (Patient not taking: Reported on 12/30/2023) 1 each 12   ipratropium (ATROVENT ) 0.03 %  nasal spray Place 2 sprays into both nostrils every 12 (twelve) hours. (Patient not taking: Reported on 12/30/2023)     sodium chloride  HYPERTONIC 3 % nebulizer solution Take by nebulization in the morning and at bedtime. (Patient not taking: Reported on 12/30/2023) 750 mL 12   Spacer/Aero-Holding Chambers (AEROCHAMBER MV) inhaler Use as instructed (Patient not taking: Reported on 12/30/2023) 1 each 0   psyllium (HYDROCIL/METAMUCIL) 95 % PACK Take 1 packet by mouth every  other day. (Patient not taking: Reported on 12/30/2023)     No facility-administered medications prior to visit.    Review of Systems  Objective:  BP 118/66   Pulse 79   Ht 5' 3 (1.6 m)   Wt 143 lb 9.6 oz (65.1 kg)   SpO2 97%   BMI 25.44 kg/m   Physical Exam  Assessment & Plan:  Mixed hyperlipidemia  Prediabetes  Long-term use of high-risk medication  Encounter for screening mammogram for malignant neoplasm of breast      I provided 40 minutes of  face-to-face time during this encounter reviewing patient's current problems and past surgeries,  recent labs and imaging studies, providing counseling on the above mentioned problems , and coordination  of Bradshaw .   Follow-up: No follow-ups on file.   Verneita LITTIE Kettering, MD

## 2023-12-31 DIAGNOSIS — Z0000 Encounter for general adult medical examination without abnormal findings: Secondary | ICD-10-CM | POA: Insufficient documentation

## 2023-12-31 LAB — LIPID PANEL
Cholesterol: 185 mg/dL (ref 0–200)
HDL: 78.2 mg/dL (ref >39.00)
LDL Cholesterol: 93 mg/dL (ref 0–99)
NonHDL: 106.78
Total CHOL/HDL Ratio: 2
Triglycerides: 71 mg/dL (ref 0.0–149.0)
VLDL: 14.2 mg/dL (ref 0.0–40.0)

## 2023-12-31 LAB — CBC WITH DIFFERENTIAL/PLATELET
Basophils Absolute: 0 K/uL (ref 0.0–0.1)
Basophils Relative: 0.3 % (ref 0.0–3.0)
Eosinophils Absolute: 0 K/uL (ref 0.0–0.7)
Eosinophils Relative: 0 % (ref 0.0–5.0)
HCT: 40 % (ref 36.0–46.0)
Hemoglobin: 13.6 g/dL (ref 12.0–15.0)
Lymphocytes Relative: 5.7 % — ABNORMAL LOW (ref 12.0–46.0)
Lymphs Abs: 0.5 K/uL — ABNORMAL LOW (ref 0.7–4.0)
MCHC: 34 g/dL (ref 30.0–36.0)
MCV: 86.8 fl (ref 78.0–100.0)
Monocytes Absolute: 0.2 K/uL (ref 0.1–1.0)
Monocytes Relative: 2.5 % — ABNORMAL LOW (ref 3.0–12.0)
Neutro Abs: 7.4 K/uL (ref 1.4–7.7)
Neutrophils Relative %: 91.5 % — ABNORMAL HIGH (ref 43.0–77.0)
Platelets: 235 K/uL (ref 150.0–400.0)
RBC: 4.61 Mil/uL (ref 3.87–5.11)
RDW: 14.1 % (ref 11.5–15.5)
WBC: 8.1 K/uL (ref 4.0–10.5)

## 2023-12-31 LAB — COMPREHENSIVE METABOLIC PANEL WITH GFR
ALT: 12 U/L (ref 0–35)
AST: 23 U/L (ref 0–37)
Albumin: 4.6 g/dL (ref 3.5–5.2)
Alkaline Phosphatase: 81 U/L (ref 39–117)
BUN: 17 mg/dL (ref 6–23)
CO2: 25 meq/L (ref 19–32)
Calcium: 9.8 mg/dL (ref 8.4–10.5)
Chloride: 98 meq/L (ref 96–112)
Creatinine, Ser: 0.98 mg/dL (ref 0.40–1.20)
GFR: 54.23 mL/min — ABNORMAL LOW (ref >60.00)
Glucose, Bld: 198 mg/dL — ABNORMAL HIGH (ref 70–99)
Potassium: 4.2 meq/L (ref 3.5–5.1)
Sodium: 134 meq/L — ABNORMAL LOW (ref 135–145)
Total Bilirubin: 0.7 mg/dL (ref 0.2–1.2)
Total Protein: 7.2 g/dL (ref 6.0–8.3)

## 2023-12-31 LAB — HEMOGLOBIN A1C: Hgb A1c MFr Bld: 6.2 % (ref 4.6–6.5)

## 2023-12-31 LAB — TSH: TSH: 0.68 u[IU]/mL (ref 0.35–5.50)

## 2023-12-31 LAB — LDL CHOLESTEROL, DIRECT: Direct LDL: 101 mg/dL

## 2023-12-31 NOTE — Assessment & Plan Note (Signed)
 lipid control important in reducing the progression of atherosclerotic disease. Continue statin therapyfor goal LDL < 100  Lab Results  Component Value Date   CHOL 185 12/30/2023   HDL 78.20 12/30/2023   LDLCALC 93 12/30/2023   LDLDIRECT 101.0 12/30/2023   TRIG 71.0 12/30/2023   CHOLHDL 2 12/30/2023

## 2023-12-31 NOTE — Assessment & Plan Note (Signed)

## 2023-12-31 NOTE — Assessment & Plan Note (Signed)
She has failed multiple trials of medications .  Alprazolam haa been the most effective.  The risks and benefits of benzodiazepine use were discussed with patient today including excessive sedation leading to respiratory depression,  impaired thinking/driving, and addiction.  Patient was advised to avoid concurrent use with alcohol, to use medication only as needed and not to share with others  .

## 2023-12-31 NOTE — Assessment & Plan Note (Signed)
 Her  random glucose is not  elevated but her A1c suggests she is at risk for developing diabetes.  I recommend he follow a low glycemic index diet and particpate regularly in an aerobic  exercise activity.  We should check an A1c in 6 months.    Lab Results  Component Value Date   HGBA1C 6.2 12/30/2023

## 2024-01-01 ENCOUNTER — Ambulatory Visit: Payer: Self-pay | Admitting: Internal Medicine

## 2024-01-01 DIAGNOSIS — T50905A Adverse effect of unspecified drugs, medicaments and biological substances, initial encounter: Secondary | ICD-10-CM | POA: Insufficient documentation

## 2024-01-01 NOTE — Assessment & Plan Note (Signed)
 Secondary to use of prednisone  .  Will repeat in a few seeks to ensure resolution

## 2024-01-01 NOTE — Addendum Note (Signed)
 Addended by: MARYLYNN VERNEITA CROME on: 01/01/2024 04:04 PM   Modules accepted: Orders

## 2024-01-01 NOTE — Assessment & Plan Note (Signed)
ZIO monitor reviewed:  no atrial fibrillation or runs of SVT

## 2024-01-02 ENCOUNTER — Encounter: Payer: Self-pay | Admitting: Pulmonary Disease

## 2024-01-07 ENCOUNTER — Ambulatory Visit
Admission: RE | Admit: 2024-01-07 | Discharge: 2024-01-07 | Disposition: A | Discharge status: Home / Self Care | Source: Ambulatory Visit | Attending: Pulmonary Disease | Admitting: Pulmonary Disease

## 2024-01-07 ENCOUNTER — Encounter: Payer: Self-pay | Admitting: Pulmonary Disease

## 2024-01-07 ENCOUNTER — Other Ambulatory Visit
Admission: RE | Admit: 2024-01-07 | Discharge: 2024-01-07 | Disposition: A | Source: Ambulatory Visit | Attending: Pulmonary Disease | Admitting: Pulmonary Disease

## 2024-01-07 ENCOUNTER — Ambulatory Visit: Admitting: Pulmonary Disease

## 2024-01-07 VITALS — BP 118/70 | HR 62 | Temp 97.1°F | Ht 63.0 in | Wt 142.8 lb

## 2024-01-07 DIAGNOSIS — J479 Bronchiectasis, uncomplicated: Secondary | ICD-10-CM | POA: Diagnosis not present

## 2024-01-07 DIAGNOSIS — R0602 Shortness of breath: Secondary | ICD-10-CM

## 2024-01-07 DIAGNOSIS — R059 Cough, unspecified: Secondary | ICD-10-CM | POA: Diagnosis not present

## 2024-01-07 DIAGNOSIS — R918 Other nonspecific abnormal finding of lung field: Secondary | ICD-10-CM | POA: Diagnosis not present

## 2024-01-07 LAB — CBC WITH DIFFERENTIAL/PLATELET
Abs Immature Granulocytes: 0.13 K/uL — ABNORMAL HIGH (ref 0.00–0.07)
Basophils Absolute: 0.1 K/uL (ref 0.0–0.1)
Basophils Relative: 1 %
Eosinophils Absolute: 0.2 K/uL (ref 0.0–0.5)
Eosinophils Relative: 2 %
HCT: 41.1 % (ref 36.0–46.0)
Hemoglobin: 13.6 g/dL (ref 12.0–15.0)
Immature Granulocytes: 2 %
Lymphocytes Relative: 20 %
Lymphs Abs: 1.6 K/uL (ref 0.7–4.0)
MCH: 29.5 pg (ref 26.0–34.0)
MCHC: 33.1 g/dL (ref 30.0–36.0)
MCV: 89.2 fL (ref 80.0–100.0)
Monocytes Absolute: 1 K/uL (ref 0.1–1.0)
Monocytes Relative: 13 %
Neutro Abs: 5.1 K/uL (ref 1.7–7.7)
Neutrophils Relative %: 62 %
Platelets: 222 K/uL (ref 150–400)
RBC: 4.61 MIL/uL (ref 3.87–5.11)
RDW: 14 % (ref 11.5–15.5)
WBC: 8.1 K/uL (ref 4.0–10.5)
nRBC: 0 % (ref 0.0–0.2)

## 2024-01-07 LAB — BASIC METABOLIC PANEL WITH GFR
Anion gap: 8 (ref 5–15)
BUN: 12 mg/dL (ref 8–23)
CO2: 31 mmol/L (ref 22–32)
Calcium: 9.4 mg/dL (ref 8.9–10.3)
Chloride: 100 mmol/L (ref 98–111)
Creatinine, Ser: 0.88 mg/dL (ref 0.44–1.00)
GFR, Estimated: >60 mL/min (ref >60)
Glucose, Bld: 92 mg/dL (ref 70–99)
Potassium: 4.2 mmol/L (ref 3.5–5.1)
Sodium: 139 mmol/L (ref 135–145)

## 2024-01-07 LAB — BRAIN NATRIURETIC PEPTIDE: B Natriuretic Peptide: 127 pg/mL — ABNORMAL HIGH (ref 0.0–100.0)

## 2024-01-07 NOTE — Progress Notes (Signed)
 Synopsis: Referred in by Alison Verneita CROME, MD   Subjective:   PATIENT ID: Alison Bradshaw Bing GENDER: female DOB: 1940-07-05, MRN: [id number]  Chief Complaint  Patient presents with   Asthma    Had Covid the end of August. Since then she has had fullness in chest. Increased DOE. Has to clear her throat constantly. No wheezing. Occasional cough. Did have a short course of Prednisone  and ZPAK about 2 weeks.     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 Marylynn Verneita for further evaluation on her asthma.   I initially saw her in August 2024, Patient reports that about 3 months ago she started having labored breathing.  Describes it as feeling of working hard to take a deep breath in.  She notices that usually during hot humid weather.  She denies any wheezing.  She denies any chest pain. She does report seasonal nasal congestion and possible post nasal drip.   We started her on Symbicort  80-4.5 which helped with her breathing. She stopped her symbicort  as it caused her hoarseness of the voice.   She reports feeling better today, still with cough and constant clearance of throat. Using her rescue inhaler once a week. Feels better using the aerochamber device.   PFTs with obstructive defect and good response to BDL. DLCO is normal. Overall consistent with asthma.   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 New solid nodule in the RLL.   Family history - Emphysema in the family   Social History - Never smoker, denies alcohol use, denies any illict drug use. Has a dog at home and worked as a Warden/ranger.    OV 06/2023 Dr. Isadora - Seen in clinic by Dr. Isadora for ongoing cough sputum production and extensive runny nose. She was provided with hypertonic saline, flutter valve  and ordered for sputum AFB and smear which came back positive for MAC.   OV 08/26/2023 - She has since improved significantly. Her shortness of breath and chest tightness has improved what's lingering is the constant feeling of needing to clear her throat. Uses symbicort  160 as prescribed and rarely uses her rescue inhaler. She is seeing ID next week to take their opinion regarding MAC treatment. ACT 22 today.   OV 01/07/2024 - [name] is doing generally well but unfortunately she contracted COVID about a month ago and currently is experiencing fatigue, chest heaviness intermittently and low energy. She states that this comes and goes. 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 I will obtain a CXR, CBC BNP and BMP. Her CXR was clear, CBC and BMP were reassuring. BNP was elevated at 127. I discussed with her in the afternoon and she stated that she was feeling good and was at a hockey stadium watching her granddaughter and went up and down the stairs without any issues. Therefore we deferred referral to the ED for cardiac/PE evaluation. However she was instructed that if she feels that chest heaviness again to go to the ED. Otherwise we will obtain an echocardiogram as outpatient. She was advised to continue with Symbicort  as well as flonase  daily.   ROS All systems were reviewed and are negative except for the above.  Objective:   Vitals:   01/07/24 1313  BP: 118/70  Pulse: 62  Temp: (!) 97.1 F (36.2 C)  SpO2: 100%  Weight: 142 lb 12.8 oz (64.8 kg)  Height: 5' 3 (1.6 m)   100% on RA BMI Readings from Last 3 Encounters:  01/07/24 25.30 kg/m  12/30/23 25.44 kg/m  11/10/23 24.80 kg/m   Wt Readings from Last 3 Encounters:  01/07/24 142 lb 12.8 oz (64.8 kg)  12/30/23 143 lb 9.6 oz (65.1 kg)  11/10/23 140 lb (63.5 kg)    Physical Exam GEN: NAD, Healthy Appearing HEENT: Supple Neck, Reactive Pupils, EOMI, erythematous nasal mucosa. CVS: Normal S1, Normal S2, RRR,  No murmurs or ES appreciated  Lungs: Clear bilateral air entry.  Abdomen: Soft, non tender, non distended, + BS  Extremities: Warm and well perfused, No edema  Skin: No suspicious lesions appreciated  Psych: Normal Affect  Ancillary Information   CBC    Component Value Date/Time   WBC 8.1 01/07/2024 1401   RBC 4.61 01/07/2024 1401   HGB 13.6 01/07/2024 1401   HCT 41.1 01/07/2024 1401   PLT 222 01/07/2024 1401   MCV 89.2 01/07/2024 1401   MCH 29.5 01/07/2024 1401   MCHC 33.1 01/07/2024 1401   RDW 14.0 01/07/2024 1401   LYMPHSABS 1.6 01/07/2024 1401   MONOABS 1.0 01/07/2024 1401   EOSABS 0.2 01/07/2024 1401   BASOSABS 0.1 01/07/2024 1401    PFT 2019 -  FVC: 3.05 L (115 %pred), FEV1: 1.81 L (89 %pred), FEV1/FVC: 60%, TLC: 4.64 L (93 %pred), DLCO 82 %pred  CT chest 09/07/2018  There is bandlike scarring of the right middle lobe and lingula with clustered nodularity of the right upper lobe. Previously noted clustered nodules of the posterior right upper lobe in this vicinity are almost completely resolved. 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 No new nodules are noted.  CT chest 2024 1. Clustered bilateral solid pulmonary nodules and ground-glass opacities most pronounced in the posterior right upper lobe, right middle lobe and lingula, slightly worsened when compared with prior exam with some pulmonary nodules increased in size and others new. 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 2. New irregular solid pulmonary nodule of the right lower lobe measuring 6 mm. Non-contrast chest CT at 3-6 months is recommended. If the nodules are stable at time of repeat CT, then future CT at 18-24 months (from today's scan) is considered optional for low-risk patients, but is recommended for high-risk patients. This recommendation follows the consensus statement: Guidelines  for Management of Incidental Pulmonary Nodules Detected on CT Images: From the Fleischner Society 2017; Radiology 2017; 284:228-243. 3. Mild coronary artery calcifications and aortic Atherosclerosis (ICD10-I70.0).    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 Marylynn Boyer for further evaluation of shortness of breath.  #Moderate persistent asthma without exacerbation  #Granulomatous lung disease? 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  #Allergic rhinitis  #RLL 6 mm nodule  EOS 200  PFTs with obstructive defect and mildly reduced DLCO but when corrected to VA it is normal. Good response to bronchodilators. Latter suggestive of persistent asthma with airway remodeling.   []  C/w Budesonide -Formoterol  [Symbicort ] to 160-4.23mcg 2 puffs twice a day with aerochamber to limit laryngeal irritation.  []  c/w Albuterol  Inh PRN 2 puffs Q6H  []  c/w Flonase  1 puff each nares daily.  []  Allergen panel and IGE (Normal) []  Will consider starting dupixent if step up therapy is needed.   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 BNP elevated at 127.  []  Echocardiogram  []  Cardiology follow up in a week.   RTC 1 week.   I spent 40 minutes caring for this patient today, including preparing to see the patient, obtaining a medical history , reviewing a  separately obtained history, performing a medically appropriate examination and/or evaluation, counseling and educating the patient/family/caregiver, ordering medications, tests, or procedures, documenting clinical information in the electronic health record, and independently interpreting results (not separately reported/billed) and communicating results to the patient/family/caregiver  Darrin Barn, MD [facility] 01/07/2024 6:39 PM

## 2024-01-08 DIAGNOSIS — J300 Vasomotor rhinitis: Secondary | ICD-10-CM | POA: Diagnosis not present

## 2024-01-08 DIAGNOSIS — H6063 Unspecified chronic otitis externa, bilateral: Secondary | ICD-10-CM | POA: Diagnosis not present

## 2024-01-09 ENCOUNTER — Encounter: Payer: Self-pay | Admitting: Cardiovascular Disease

## 2024-01-12 ENCOUNTER — Telehealth: Payer: Self-pay | Admitting: Cardiovascular Disease

## 2024-01-12 NOTE — Telephone Encounter (Signed)
 Spoke to patient nothing available sooner put on waitlist.

## 2024-01-13 ENCOUNTER — Ambulatory Visit: Attending: Cardiovascular Disease | Admitting: Cardiovascular Disease

## 2024-01-13 ENCOUNTER — Ambulatory Visit (INDEPENDENT_AMBULATORY_CARE_PROVIDER_SITE_OTHER)

## 2024-01-13 ENCOUNTER — Ambulatory Visit

## 2024-01-13 ENCOUNTER — Encounter: Payer: Self-pay | Admitting: Cardiovascular Disease

## 2024-01-13 VITALS — BP 118/60 | HR 58 | Ht 63.0 in | Wt 144.2 lb

## 2024-01-13 DIAGNOSIS — I471 Supraventricular tachycardia, unspecified: Secondary | ICD-10-CM | POA: Diagnosis not present

## 2024-01-13 DIAGNOSIS — E782 Mixed hyperlipidemia: Secondary | ICD-10-CM | POA: Diagnosis not present

## 2024-01-13 DIAGNOSIS — R072 Precordial pain: Secondary | ICD-10-CM

## 2024-01-13 DIAGNOSIS — Z23 Encounter for immunization: Secondary | ICD-10-CM

## 2024-01-13 MED ORDER — METOPROLOL TARTRATE 25 MG PO TABS: ORAL_TABLET | ORAL | 0 refills | Status: DC

## 2024-01-13 NOTE — Progress Notes (Signed)
 After obtaining consent, and per orders of Dr. Marylynn, injection of Prevnar 20 given by Karen Farr. Pt received vaccine in left deltoid. Pt tolerated shot well and was advised to contact office if develop any side effects.

## 2024-01-13 NOTE — Patient Instructions (Signed)
 Medication Instructions:  No changes  *If you need a refill on your cardiac medications before your next appointment, please call your pharmacy*  Lab Work: None ordered If you have labs (blood work) drawn today and your tests are completely normal, you will receive your results only by: MyChart Message (if you have MyChart) OR A paper copy in the mail If you have any lab test that is abnormal or we need to change your treatment, we will call you to review the results.   Follow-Up: At [facility], you and your health needs are our priority.  As part of our continuing mission to provide you with exceptional heart care, our providers are all part of one team.  This team includes your primary Cardiologist (physician) and Advanced Practice Providers or APPs (Physician Assistants and Nurse Practitioners) who all work together to provide you with the care you need, when you need it.  Your next appointment:   6 week(s)  Provider:   You may see Deatrice Cage, MD or one of the following Advanced Practice Providers on your designated Care Team:   Lonni Meager, NP Lesley Maffucci, PA-C Bernardino Bring, PA-C [facility], PA-C Tylene Lunch, NP Barnie Hila, NP    We recommend signing up for the patient portal called MyChart.  Sign up information is provided on this After Visit Summary.  MyChart is used to connect with patients for Virtual Visits (Telemedicine).  Patients are able to view lab/test results, encounter notes, upcoming appointments, etc.  Non-urgent messages can be sent to your provider as well.   To learn more about what you can do with MyChart, go to [email or web address].   Other Instructions   Your cardiac CT will be scheduled at one of the below locations:   [facility] [address] [place], [place] [address] [phone]  If scheduled at [facility], please arrive to the Heart and Vascular Center 15 mins  early for check-in and test prep.  There is spacious parking and easy access to the radiology department from the [facility] entrance. Please enter here and check-in with the desk attendant.   Please follow these instructions carefully (unless otherwise directed):  An IV will be required for this test and Nitroglycerin  will be given.    On the Night Before the Test: Be sure to Drink plenty of water. Do not consume any caffeinated/decaffeinated beverages or chocolate 12 hours prior to your test. Do not take any antihistamines 12 hours prior to your test.  On the Day of the Test: Drink plenty of water until 1 hour prior to the test. Do not eat any food 1 hour prior to test. You may take your regular medications prior to the test.  Take metoprolol (Lopressor) two hours prior to test. If you take Furosemide/Hydrochlorothiazide/Spironolactone/Chlorthalidone, please HOLD on the morning of the test. Patients who wear a continuous glucose monitor MUST remove the device prior to scanning. FEMALES- please wear underwire-free bra if available, avoid dresses & tight clothing  After the Test: Drink plenty of water. After receiving IV contrast, you may experience a mild flushed feeling. This is normal. On occasion, you may experience a mild rash up to 24 hours after the test. This is not dangerous. If this occurs, you can take Benadryl 25 mg, Zyrtec, Claritin, or Allegra and increase your fluid intake. (Patients taking Tikosyn should avoid Benadryl, and may take Zyrtec, Claritin, or Allegra) If you experience trouble breathing, this can be  serious. If it is severe call 911 IMMEDIATELY. If it is mild, please call our office.  We will call to schedule your test 2-4 weeks out understanding that some insurance companies will need an authorization prior to the service being performed.   For more information and frequently asked questions, please visit our website :  [email or web address]  For non-scheduling related questions, please contact the cardiac imaging nurse navigator should you have any questions/concerns: Cardiac Imaging Nurse Navigators Direct Office Dial: [phone]   For scheduling needs, including cancellations and rescheduling, please call [place], [phone].

## 2024-01-13 NOTE — Progress Notes (Signed)
 Cardiology Office Note   Date:  01/13/2024   ID:  Alison, Bradshaw [birth year], MRN [id number]  PCP:  Alison Verneita CROME, MD  Cardiologist:   Deatrice Cage, MD   Chief Complaint  Patient presents with   Follow-up    12 month f/u c/o heaviness chest discomfort, sob with exertion and no energy. Meds reviewed verbally with pt.      History of Present Illness: Alison Bradshaw is a 83 y.o. female who presents for a follow-up visit regarding PVCs and recent symptoms of chest pain and fatigue.    She has known history of GERD, previous slight tobacco use and hyperlipidemia.  There is no family history of coronary artery disease. She had cardiac CTA in September 2019  which showed a calcium  score of 39 with mild nonobstructive coronary artery disease in the LAD distribution. She had intermittent palpitations due to PVCs. 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  She recently started having increased shortness of breath with intermittent chest heaviness and fatigue.  This started about 2-1/2 weeks ago.  She had labs done which showed mildly elevated BNP at 127.  She feels that she has no energy which is very unusual for her.  She continues to have intermittent chest tightness with exertion.  Past Medical History:  Diagnosis Date   Anxiety    Asthma    Atypical chest pain    Barrett's esophagus    Chronic gastritis    Diverticulosis    Duodenal ulcer    Gastritis    GERD (gastroesophageal reflux disease)    Hiatal hernia    History of echocardiogram    a. 08/2020 Echo: EF 60-65%, no rwma, Nl RV fxn, mild MR.   Hyperlipidemia    Mycobacterium avium complex (HCC)    Non-obstructive CAD (coronary artery disease)    a. 10/2017 Stress Echo: Nl EF, no ischemia. Mild MR; b. 12/2017 Cardiac CTA: LM nl, LAD 50p, 30d, D1/2 nl, RI nl, LCX nl, OM1/2 nl, RCA nl, RPDA/RPL nl. Ca2+ score = 39 (46th percentile)-->statin dose escalated; c. 10/2021  MV: No ischemia/infarct. Nl LV fxn.   Osteopenia    Peptic ulcer, site unspecified, unspecified as acute or chronic, without hemorrhage or perforation 11/19/2012   Formatting of this note might be different from the original.  duodenum 1966  Formatting of this note might be different from the original.  Overview:   duodenum 1966   PSVT (paroxysmal supraventricular tachycardia)    a. 10/2017 Holter: PSVT, max 5 beats->did not tolerate beta blocker; b. 09/2020 Zio: 38 runs of SVT. Longest 18.1 secs (106), fastest 203 bpm (6 beats). Occas PVCs (1.1%) - some assoc w/ triggered events; C. 11/2022 Zio: Predominantly sinus rhythm @ 69 (48-182).  34 runs of PSVT, longest 12 beats.  3.3% PVC burden.  No sustained arrhythmias or A-fib.   Pulmonary nodules    a. 12/2017 Cardiac CTA: RLL 9mm nodule; b. 04/2018 RLL 9mm nodule resolved; c. 09/2018 CT Chest: Previously noted posterior RUL nodules almost completely resolved. Bandlike scarring of RML and lingula-->atyp infxn; d. 11/2022 CT Chest: Clustered bilateral solid pulmonary nodules and groundglass opacities, most pronounced in posterior RUL, RML, and lingula - ? chronic atyp infxn. New 6mm RLL nodule.   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   BREAST BIOPSY Left 2008   neg   BREAST EXCISIONAL BIOPSY Left 2004   neg   CATARACT EXTRACTION W/ INTRAOCULAR LENS  IMPLANT, BILATERAL Bilateral    COLONOSCOPY WITH PROPOFOL  N/A 01/22/2017   Procedure: COLONOSCOPY WITH PROPOFOL ;  Surgeon: Toledo, Ladell POUR, MD;  Location: [facility] ENDOSCOPY;  Service: Gastroenterology;  Laterality: N/A;   COLONOSCOPY WITH PROPOFOL  N/A 03/26/2022    Procedure: COLONOSCOPY WITH PROPOFOL ;  Surgeon: Maryruth Ole DASEN, MD;  Location: [facility] ENDOSCOPY;  Service: Endoscopy;  Laterality: N/A;   ESOPHAGOGASTRODUODENOSCOPY (EGD) WITH PROPOFOL  N/A 01/20/2015   Procedure: ESOPHAGOGASTRODUODENOSCOPY (EGD) WITH PROPOFOL ;  Surgeon: Gladis RAYMOND Mariner, MD;  Location[facility] ENDOSCOPY;  Service: Endoscopy;  Laterality: N/A;   ESOPHAGOGASTRODUODENOSCOPY (EGD) WITH PROPOFOL  N/A 01/22/2017   Procedure: ESOPHAGOGASTRODUODENOSCOPY (EGD) WITH PROPOFOL ;  Surgeon: Toledo, Ladell POUR, MD;  Location: [facility] ENDOSCOPY;  Service: Gastroenterology;  Laterality: N/A;   ESOPHAGOGASTRODUODENOSCOPY (EGD) WITH PROPOFOL  N/A 04/14/2018   Procedure: ESOPHAGOGASTRODUODENOSCOPY (EGD) WITH PROPOFOL ;  Surgeon: Mariner Gladis RAYMOND, MD;  Location: [facility] ENDOSCOPY;  Service: Endoscopy;  Laterality: N/A;   ESOPHAGOGASTRODUODENOSCOPY (EGD) WITH PROPOFOL  N/A 10/16/2021   Procedure: ESOPHAGOGASTRODUODENOSCOPY (EGD) WITH PROPOFOL ;  Surgeon: Maryruth Ole DASEN, MD;  Location: [facility] ENDOSCOPY;  Service: Endoscopy;  Laterality: N/A;   EYE SURGERY Right    laser scraping   OPEN REDUCTION INTERNAL FIXATION (ORIF) DISTAL RADIAL FRACTURE Right 02/09/2018   Procedure: OPEN REDUCTION INTERNAL FIXATION (ORIF) DISTAL RADIAL FRACTURE;  Surgeon: Kathlynn Sharper, MD;  Location: [facility] ORS;  Service: Orthopedics;  Laterality: Right;   TONSILLECTOMY       Current Outpatient Medications  Medication Sig Dispense Refill   acetaminophen  (TYLENOL ) 500 MG tablet Take 1,000 mg by mouth every 6 (six) hours as needed.     albuterol  (PROVENTIL ) (2.5 MG/3ML) 0.083% nebulizer solution Take 3 mLs (2.5 mg total) by nebulization in the morning and at bedtime. 75 mL 11   albuterol  (VENTOLIN  HFA) 108 (90 Base) MCG/ACT inhaler Inhale 2 puffs into the lungs every 6 (six) hours as needed for shortness of breath. 3 each 0   ALPRAZolam  (XANAX ) 0.25 MG tablet Take 1 tablet (0.25 mg total) by mouth 2 (two) times daily as needed for  anxiety. 45 tablet 5   benzonatate  (TESSALON ) 100 MG capsule Take 1 capsule (100 mg total) by mouth 3 (three) times daily as needed for cough. 21 capsule 0   budesonide -formoterol  (SYMBICORT ) 160-4.5 MCG/ACT inhaler Inhale 2 puffs into the lungs in the morning and at bedtime. 1 each 12   cetirizine (ZYRTEC) 10 MG tablet Take 10 mg by mouth daily as needed for allergies.     clindamycin  (CLEOCIN  T) 1 % lotion Apply topically.     cyclobenzaprine  (FLEXERIL ) 5 MG tablet Take 1 tablet (5 mg total) by mouth at bedtime. (Patient taking differently: Take 5 mg by mouth as needed.) 14 tablet 1   cycloSPORINE (RESTASIS) 0.05 % ophthalmic emulsion Place 1 drop into both eyes 2 (two) times daily. (Patient taking differently: Place 1 drop into both eyes as needed.)     dextromethorphan-guaiFENesin  (MUCINEX  DM) 30-600 MG 12hr tablet Take 1 tablet by mouth 2 (two) times daily. (Patient taking differently: Take 1 tablet by mouth as needed.)     fluticasone  (FLONASE ) 50 MCG/ACT nasal spray Place 1 spray  into both nostrils in the morning and at bedtime. 100 mL 2   guaiFENesin -codeine  (VIRTUSSIN A/C) 100-10 MG/5ML syrup Take 5 mLs by mouth at bedtime and may repeat dose one time if needed. 150 mL 0   ipratropium (ATROVENT ) 0.03 % nasal spray Place 2 sprays into both nostrils every 12 (twelve) hours.     mometasone (ELOCON) 0.1 % lotion      pantoprazole  (PROTONIX ) 40 MG tablet TAKE 1 TABLET(40 MG) BY MOUTH DAILY 90 tablet 3   rosuvastatin  (CRESTOR ) 10 MG tablet TAKE 1 TABLET(10 MG) BY MOUTH DAILY 90 tablet 1   sodium chloride  HYPERTONIC 3 % nebulizer solution Take by nebulization in the morning and at bedtime. 750 mL 12   Spacer/Aero-Holding Chambers (AEROCHAMBER MV) inhaler Use as instructed 1 each 0   sucralfate (CARAFATE) 1 G tablet Take 1 g by mouth as needed. Pt only takes when she's really needs it which isn't very often     No current facility-administered medications for this visit.    Allergies:    Percocet [oxycodone -acetaminophen ], Ceftin [cefuroxime axetil], and Timentin [ticarcillin-pot clavulanate]    Social History:  The patient  reports that she has never smoked. She has been exposed to tobacco smoke. She has never used smokeless tobacco. She reports current alcohol use of about 2.0 standard drinks of alcohol per week. She reports that she does not use drugs.   Family History:  The patient's family history includes AAA (abdominal aortic aneurysm) in her father; Alzheimer's disease in her mother; Diabetes in her paternal uncle; Emphysema in her father; Hyperlipidemia in her mother; Kidney Stones in her father; Kidney cancer in her brother; Pancreatic cancer in her paternal uncle.    ROS:  Please see the history of present illness.   Otherwise, review of systems are positive for none.   All other systems are reviewed and negative.    PHYSICAL EXAM: VS:  BP 118/60 (BP Location: Left Arm, Patient Position: Sitting, Cuff Size: Normal)   Ht 5' 3 (1.6 m)   Wt 144 lb 4 oz (65.4 kg)   SpO2 98%   BMI 25.55 kg/m  , BMI Body mass index is 25.55 kg/m. GEN: Well nourished, well developed, in no acute distress  HEENT: normal  Neck: no JVD, carotid bruits, or masses Cardiac: RRR; no murmurs, rubs, or gallops,no edema  Respiratory:  clear to auscultation bilaterally, normal work of breathing GI: soft, nontender, nondistended, + BS MS: no deformity or atrophy  Skin: warm and dry, no rash Neuro:  Strength and sensation are intact Psych: euthymic mood, full affect   EKG:  EKG is ordered today. The ekg ordered today demonstrates : Sinus bradycardia Possible Anterior infarct , age undetermined When compared with ECG of 25-Aug-2023 11:23, QRS axis Shifted left Nonspecific T wave abnormality now evident in Inferior leads    Recent Labs: 12/30/2023: ALT 12; TSH 0.68 01/07/2024: B Natriuretic Peptide 127.0; BUN 12; Creatinine, Ser 0.88; Hemoglobin 13.6; Platelets 222; Potassium 4.2;  Sodium 139    Lipid Panel    Component Value Date/Time   CHOL 185 12/30/2023 1618   TRIG 71.0 12/30/2023 1618   HDL 78.20 12/30/2023 1618   CHOLHDL 2 12/30/2023 1618   VLDL 14.2 12/30/2023 1618   LDLCALC 93 12/30/2023 1618   LDLDIRECT 101.0 12/30/2023 1618      Wt Readings from Last 3 Encounters:  01/13/24 144 lb 4 oz (65.4 kg)  01/07/24 142 lb 12.8 oz (64.8 kg)  12/30/23 143 lb 9.6 oz (  65.1 kg)          No data to display            ASSESSMENT AND PLAN:  1.  Exertional chest pain and heaviness concerning for angina: EKG is unremarkable.  Recommend evaluation with cardiac CTA. Given increased shortness of breath and mildly elevated BNP, I agree with an echocardiogram which is already scheduled.  2.  Palpitations: Due to short runs of SVT and PVCs.  Symptoms are reasonably controlled without medications at the present time.  3.  Hyperlipidemia: Currently on rosuvastatin  10 mg daily.  Recent lipid profile showed an LDL of 93.  If cardiac CTA shows significant CAD, we will have to be more aggressive with this.   Disposition:   FU in 6 weeks.  Signed,  Deatrice Cage, MD  01/13/2024 4:22 PM    [facility]

## 2024-01-14 ENCOUNTER — Ambulatory Visit: Admitting: Pulmonary Disease

## 2024-01-14 ENCOUNTER — Encounter: Payer: Self-pay | Admitting: Pulmonary Disease

## 2024-01-14 VITALS — BP 110/62 | HR 62 | Temp 97.7°F | Ht 63.0 in | Wt 142.8 lb

## 2024-01-14 DIAGNOSIS — J4540 Moderate persistent asthma, uncomplicated: Secondary | ICD-10-CM | POA: Diagnosis not present

## 2024-01-14 DIAGNOSIS — J309 Allergic rhinitis, unspecified: Secondary | ICD-10-CM | POA: Diagnosis not present

## 2024-01-14 DIAGNOSIS — R911 Solitary pulmonary nodule: Secondary | ICD-10-CM

## 2024-01-14 DIAGNOSIS — R079 Chest pain, unspecified: Secondary | ICD-10-CM

## 2024-01-14 DIAGNOSIS — R5383 Other fatigue: Secondary | ICD-10-CM

## 2024-01-14 NOTE — Progress Notes (Signed)
 Order(s) created erroneously. Erroneous order ID: [id number]  Order moved by: CHART CORRECTION ANALYST NINE, IDENTITY  Order move date/time: 01/14/2024 10:07 AM  Source Patient: [id number]  Source Contact: 01/13/2024  Destination Patient: [id number]  Destination Contact: 09/18/2022

## 2024-01-14 NOTE — Progress Notes (Signed)
 Synopsis: Referred in by Marylynn Verneita CROME, MD   Subjective:   PATIENT ID: Alison Bradshaw GENDER: female DOB: 15-Oct-1940, MRN: [id number]  Chief Complaint  Patient presents with   Asthma    Cough with creamy phlegm. Shortness of breath and wheezing.     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 Marylynn Verneita for further evaluation on her asthma.   I initially saw her in August 2024, Patient reports that about 3 months ago she started having labored breathing.  Describes it as feeling of working hard to take a deep breath in.  She notices that usually during hot humid weather.  She denies any wheezing.  She denies any chest pain. She does report seasonal nasal congestion and possible post nasal drip.   We started her on Symbicort  80-4.5 which helped with her breathing. She stopped her symbicort  as it caused her hoarseness of the voice.   She reports feeling better today, still with cough and constant clearance of throat. Using her rescue inhaler once a week. Feels better using the aerochamber device.   PFTs with obstructive defect and good response to BDL. DLCO is normal. Overall consistent with asthma.   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 New solid nodule in the RLL.   Family history - Emphysema in the family   Social History - Never smoker, denies alcohol use, denies any illict drug use. Has a dog at home and worked as a Warden/ranger.    OV 06/2023 Dr. Isadora - Seen in clinic by Dr. Isadora for ongoing cough sputum production and extensive runny nose. 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   OV 08/26/2023 - She has since improved significantly. Her shortness of breath and  chest tightness has improved what's lingering is the constant feeling of needing to clear her throat. Uses symbicort  160 as prescribed and rarely uses her rescue inhaler. She is seeing ID next week to take their opinion regarding MAC treatment. ACT 22 today.   OV 01/07/2024 - Alison Bradshaw is doing generally well but unfortunately she contracted COVID about a month ago and currently is experiencing fatigue, chest heaviness intermittently and low energy. She states that this comes and goes. 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 I will obtain a CXR, CBC BNP and BMP. Her CXR was clear, CBC and BMP were reassuring. BNP was elevated at 127. I discussed with her in the afternoon and she stated that she was feeling good and was [place] a hockey [place] watching her granddaughter and went up and down the stairs without any issues. Therefore we deferred referral to the ED for cardiac/PE evaluation. However she was instructed that if she feels that chest heaviness again to go to the ED. Otherwise we will obtain an echocardiogram as outpatient. She was advised to continue with Symbicort  as well as flonase  daily.   OV 01/14/2024 Alison Bradshaw is still feeling the same as before with episodes of exhaustion throughout the day. 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 Her CBC w/ diff is normal only shows slight increase in immature granulocytes. Kidney function and LFTs are normal. She saw Cardiology recently and is pending  a CT coronary and an echocardiogram.   ROS All systems were reviewed and are negative except for the above.  Objective:   Vitals:   01/14/24 1050  BP: 110/62  Pulse: 62  Temp: 97.7 F (36.5 C)  TempSrc: Temporal  SpO2: 99%  Weight: 142 lb 12.8 oz (64.8 kg)  Height: 5' 3 (1.6 m)   99% on RA BMI Readings from Last 3 Encounters:  01/14/24 25.30 kg/m  01/13/24 25.55 kg/m  01/07/24 25.30 kg/m   Wt Readings from Last 3 Encounters:  01/14/24 142 lb 12.8 oz (64.8 kg)   01/13/24 144 lb 4 oz (65.4 kg)  01/07/24 142 lb 12.8 oz (64.8 kg)    Physical Exam GEN: NAD, Healthy Appearing HEENT: Supple Neck, Reactive Pupils, EOMI, erythematous nasal mucosa. CVS: Normal S1, Normal S2, RRR, No murmurs or ES appreciated  Lungs: Clear bilateral air entry.  Abdomen: Soft, non tender, non distended, + BS  Extremities: Warm and well perfused, No edema  Skin: No suspicious lesions appreciated  Psych: Normal Affect  Ancillary Information   CBC    Component Value Date/Time   WBC 8.1 01/07/2024 1401   RBC 4.61 01/07/2024 1401   HGB 13.6 01/07/2024 1401   HCT 41.1 01/07/2024 1401   PLT 222 01/07/2024 1401   MCV 89.2 01/07/2024 1401   MCH 29.5 01/07/2024 1401   MCHC 33.1 01/07/2024 1401   RDW 14.0 01/07/2024 1401   LYMPHSABS 1.6 01/07/2024 1401   MONOABS 1.0 01/07/2024 1401   EOSABS 0.2 01/07/2024 1401   BASOSABS 0.1 01/07/2024 1401    PFT 2019 -  FVC: 3.05 L (115 %pred), FEV1: 1.81 L (89 %pred), FEV1/FVC: 60%, TLC: 4.64 L (93 %pred), DLCO 82 %pred  CT chest 09/07/2018  There is bandlike scarring of the right middle lobe and lingula with clustered nodularity of the right upper lobe. Previously noted clustered nodules of the posterior right upper lobe in this vicinity are almost completely resolved. 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 No new nodules are noted.  CT chest 2024 1. Clustered bilateral solid pulmonary nodules and ground-glass opacities most pronounced in the posterior right upper lobe, right middle lobe and lingula, slightly worsened when compared with prior exam with some pulmonary nodules increased in size and others new. 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 2. New irregular solid pulmonary nodule of the right lower lobe measuring 6 mm. Non-contrast chest CT at 3-6 months is recommended. If the nodules are stable at time of  repeat CT, then future CT at 18-24 months (from today's scan) is considered optional for low-risk patients, but is recommended for high-risk patients. This recommendation follows the consensus statement: Guidelines for Management of Incidental Pulmonary Nodules Detected on CT Images: From the Fleischner Society 2017; Radiology 2017; 284:228-243. 3. Mild coronary artery calcifications and aortic Atherosclerosis (ICD10-I70.0).    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 Marylynn Boyer for further evaluation of shortness of breath.  #Moderate persistent asthma without exacerbation  #Granulomatous lung disease? 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  #Allergic rhinitis  #RLL 6 mm nodule  EOS 200  PFTs with obstructive defect and mildly reduced DLCO but when corrected to VA it is normal. Good response to bronchodilators. Latter suggestive of persistent asthma with airway remodeling.   []  C/w Budesonide -Formoterol  [Symbicort ] to 160-4.1mcg 2 puffs twice a day with aerochamber to limit laryngeal irritation.  []  c/w Albuterol  Inh PRN 2 puffs Q6H  []  c/w Flonase  1 puff each nares daily.  []  Allergen panel and IGE (Normal) []  Will consider starting dupixent if step up therapy is needed.   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 BNP elevated at  127.  []  Echocardiogram  []  Cardiology follow up in a week. Pending CT coronary []  D-Dimer   RTC 3 months   I personally spent a total of 20 minutes in the care of the patient today including preparing to see the patient, getting/reviewing separately obtained history, performing a medically appropriate exam/evaluation, counseling and educating, placing orders, documenting clinical information in the EHR, independently interpreting results, and communicating results.   Darrin Barn, MD [facility] 01/14/2024 5:37 PM

## 2024-01-15 ENCOUNTER — Other Ambulatory Visit
Admission: RE | Admit: 2024-01-15 | Discharge: 2024-01-15 | Disposition: A | Discharge status: Home / Self Care | Source: Ambulatory Visit | Attending: Pulmonary Disease | Admitting: Pulmonary Disease

## 2024-01-15 DIAGNOSIS — R5383 Other fatigue: Secondary | ICD-10-CM | POA: Diagnosis not present

## 2024-01-15 DIAGNOSIS — J45998 Other asthma: Secondary | ICD-10-CM | POA: Diagnosis not present

## 2024-01-15 DIAGNOSIS — J45909 Unspecified asthma, uncomplicated: Secondary | ICD-10-CM | POA: Diagnosis not present

## 2024-01-15 LAB — CBC WITH DIFFERENTIAL/PLATELET
Abs Immature Granulocytes: 0.02 K/uL (ref 0.00–0.07)
Basophils Absolute: 0 K/uL (ref 0.0–0.1)
Basophils Relative: 1 %
Eosinophils Absolute: 0.1 K/uL (ref 0.0–0.5)
Eosinophils Relative: 2 %
HCT: 38.8 % (ref 36.0–46.0)
Hemoglobin: 12.9 g/dL (ref 12.0–15.0)
Immature Granulocytes: 0 %
Lymphocytes Relative: 24 %
Lymphs Abs: 1.3 K/uL (ref 0.7–4.0)
MCH: 29.5 pg (ref 26.0–34.0)
MCHC: 33.2 g/dL (ref 30.0–36.0)
MCV: 88.8 fL (ref 80.0–100.0)
Monocytes Absolute: 0.7 K/uL (ref 0.1–1.0)
Monocytes Relative: 14 %
Neutro Abs: 3.2 K/uL (ref 1.7–7.7)
Neutrophils Relative %: 59 %
Platelets: 181 K/uL (ref 150–400)
RBC: 4.37 MIL/uL (ref 3.87–5.11)
RDW: 13.4 % (ref 11.5–15.5)
WBC: 5.3 K/uL (ref 4.0–10.5)
nRBC: 0 % (ref 0.0–0.2)

## 2024-01-15 LAB — D-DIMER, QUANTITATIVE: D-Dimer, Quant: 0.51 ug{FEU}/mL — ABNORMAL HIGH (ref 0.00–0.50)

## 2024-01-16 ENCOUNTER — Ambulatory Visit: Payer: Self-pay | Admitting: Pulmonary Disease

## 2024-01-16 ENCOUNTER — Ambulatory Visit
Admission: RE | Admit: 2024-01-16 | Discharge: 2024-01-16 | Disposition: A | Discharge status: Home / Self Care | Source: Ambulatory Visit | Attending: Pulmonary Disease | Admitting: Pulmonary Disease

## 2024-01-16 DIAGNOSIS — R000 Tachycardia, unspecified: Secondary | ICD-10-CM | POA: Diagnosis not present

## 2024-01-16 DIAGNOSIS — I739 Peripheral vascular disease, unspecified: Secondary | ICD-10-CM | POA: Diagnosis not present

## 2024-01-16 DIAGNOSIS — I081 Rheumatic disorders of both mitral and tricuspid valves: Secondary | ICD-10-CM | POA: Insufficient documentation

## 2024-01-16 DIAGNOSIS — R0602 Shortness of breath: Secondary | ICD-10-CM | POA: Diagnosis not present

## 2024-01-16 DIAGNOSIS — E785 Hyperlipidemia, unspecified: Secondary | ICD-10-CM | POA: Diagnosis not present

## 2024-01-16 DIAGNOSIS — R0600 Dyspnea, unspecified: Secondary | ICD-10-CM | POA: Diagnosis not present

## 2024-01-16 NOTE — Progress Notes (Signed)
 Echocardiogram 2D Echocardiogram has been performed.  Alison Bradshaw 01/16/2024, 4:40 PM

## 2024-01-17 LAB — ECHOCARDIOGRAM COMPLETE
AR max vel: 2.12 cm2
AV Peak grad: 5.9 mmHg
Ao pk vel: 1.21 m/s
Area-P 1/2: 3.6 cm2
MV M vel: 4.47 m/s
MV Peak grad: 79.9 mmHg
S' Lateral: 2.4 cm

## 2024-01-29 ENCOUNTER — Other Ambulatory Visit: Payer: Self-pay | Admitting: Internal Medicine

## 2024-01-29 ENCOUNTER — Ambulatory Visit: Admitting: Student in an Organized Health Care Education/Training Program

## 2024-01-29 ENCOUNTER — Encounter (HOSPITAL_COMMUNITY): Payer: Self-pay

## 2024-01-29 NOTE — Telephone Encounter (Unsigned)
 Copied from CRM [id number]. Topic: Clinical - Medication Refill >> Jan 29, 2024  2:22 PM Mia F wrote: Medication: albuterol  (VENTOLIN  HFA) 108 (90 Base) MCG/ACT inhaler   Has the patient contacted their pharmacy? Yes (Agent: If no, request that the patient contact the pharmacy for the refill. If patient does not wish to contact the pharmacy document the reason why and proceed with request.) (Agent: If yes, when and what did the pharmacy advise?)  This is the patient's preferred pharmacy:  [id number] - [place], [place] - [address] AT [facility] OF [address] [facility] [address] [place] [address] Phone: [phone] Fax: [phone]  Is this the correct pharmacy for this prescription? Yes If no, delete pharmacy and type the correct one.   Has the prescription been filled recently? No  Is the patient out of the medication? No  Has the patient been seen for an appointment in the last year OR does the patient have an upcoming appointment? Yes  Can we respond through MyChart? No  Agent: Please be advised that Rx refills may take up to 3 business days. We ask that you follow-up with your pharmacy.

## 2024-01-30 ENCOUNTER — Ambulatory Visit

## 2024-01-30 ENCOUNTER — Ambulatory Visit (INDEPENDENT_AMBULATORY_CARE_PROVIDER_SITE_OTHER)

## 2024-01-30 DIAGNOSIS — Z23 Encounter for immunization: Secondary | ICD-10-CM | POA: Diagnosis not present

## 2024-01-30 MED ORDER — ALBUTEROL SULFATE HFA 108 (90 BASE) MCG/ACT IN AERS: 2.0000 | INHALATION_SPRAY | Freq: Four times a day (QID) | RESPIRATORY_TRACT | 0 refills | Status: AC | PRN

## 2024-01-30 NOTE — Progress Notes (Addendum)
 Pt received High Dose Flu injection in right deltoid muscle. Pt tolerated it well with no complaints or concerns.

## 2024-02-02 ENCOUNTER — Ambulatory Visit: Admitting: Internal Medicine

## 2024-02-02 ENCOUNTER — Encounter: Payer: Self-pay | Admitting: Internal Medicine

## 2024-02-02 ENCOUNTER — Ambulatory Visit: Attending: Internal Medicine

## 2024-02-02 ENCOUNTER — Ambulatory Visit
Admission: RE | Admit: 2024-02-02 | Discharge: 2024-02-02 | Disposition: A | Discharge status: Home / Self Care | Source: Ambulatory Visit | Attending: Cardiovascular Disease | Admitting: Cardiovascular Disease

## 2024-02-02 VITALS — BP 130/64 | HR 57 | Ht 63.0 in | Wt 142.2 lb

## 2024-02-02 DIAGNOSIS — J8410 Pulmonary fibrosis, unspecified: Secondary | ICD-10-CM | POA: Diagnosis not present

## 2024-02-02 DIAGNOSIS — J300 Vasomotor rhinitis: Secondary | ICD-10-CM | POA: Diagnosis not present

## 2024-02-02 DIAGNOSIS — R001 Bradycardia, unspecified: Secondary | ICD-10-CM

## 2024-02-02 DIAGNOSIS — R0602 Shortness of breath: Secondary | ICD-10-CM | POA: Diagnosis not present

## 2024-02-02 DIAGNOSIS — R072 Precordial pain: Secondary | ICD-10-CM | POA: Diagnosis not present

## 2024-02-02 DIAGNOSIS — K219 Gastro-esophageal reflux disease without esophagitis: Secondary | ICD-10-CM

## 2024-02-02 MED ORDER — SUCRALFATE 1 G PO TABS: 1.0000 g | ORAL_TABLET | ORAL | 1 refills | Status: DC | PRN

## 2024-02-02 MED ORDER — IOHEXOL 350 MG/ML SOLN
100.0000 mL | Freq: Once | INTRAVENOUS | Status: AC | PRN
  Administered 2024-02-02: 100 mL via INTRAVENOUS

## 2024-02-02 MED ORDER — NITROGLYCERIN 0.4 MG SL SUBL
0.8000 mg | SUBLINGUAL_TABLET | Freq: Once | SUBLINGUAL | Status: AC
  Administered 2024-02-02: 0.8 mg via SUBLINGUAL
  Filled 2024-02-02: qty 25

## 2024-02-02 NOTE — Progress Notes (Unsigned)
 Subjective:  Patient ID: Alison Bradshaw, female    DOB: 1941/01/02  Age: 83 y.o. MRN: [id number]  CC: There were no encounter diagnoses.   HPI Alison Bradshaw presents for  Chief Complaint  Patient presents with   Medical Management of Chronic Issues   Ongoing production of sputum , shortness of breath with exertion is new , even with a leisurely stroll.   Sats have been 98% during epiode but not the pulse      Outpatient Medications Prior to Visit  Medication Sig Dispense Refill   acetaminophen  (TYLENOL ) 500 MG tablet Take 1,000 mg by mouth every 6 (six) hours as needed.     albuterol  (PROVENTIL ) (2.5 MG/3ML) 0.083% nebulizer solution Take 3 mLs (2.5 mg total) by nebulization in the morning and at bedtime. 75 mL 11   albuterol  (VENTOLIN  HFA) 108 (90 Base) MCG/ACT inhaler Inhale 2 puffs into the lungs every 6 (six) hours as needed for shortness of breath. 3 each 0   ALPRAZolam  (XANAX ) 0.25 MG tablet Take 1 tablet (0.25 mg total) by mouth 2 (two) times daily as needed for anxiety. 45 tablet 5   benzonatate  (TESSALON ) 100 MG capsule Take 1 capsule (100 mg total) by mouth 3 (three) times daily as needed for cough. 21 capsule 0   budesonide -formoterol  (SYMBICORT ) 160-4.5 MCG/ACT inhaler Inhale 2 puffs into the lungs in the morning and at bedtime. 1 each 12   clindamycin  (CLEOCIN  T) 1 % lotion Apply topically.     cycloSPORINE (RESTASIS) 0.05 % ophthalmic emulsion Place 1 drop into both eyes 2 (two) times daily. (Patient taking differently: Place 1 drop into both eyes as needed.)     fluticasone  (FLONASE ) 50 MCG/ACT nasal spray Place 1 spray into both nostrils in the morning and at bedtime. 100 mL 2   mometasone (ELOCON) 0.1 % lotion      pantoprazole  (PROTONIX ) 40 MG tablet TAKE 1 TABLET(40 MG) BY MOUTH DAILY 90 tablet 3   rosuvastatin  (CRESTOR ) 10 MG tablet TAKE 1 TABLET(10 MG) BY MOUTH DAILY 90 tablet 1   sodium chloride  HYPERTONIC 3 % nebulizer solution Take by nebulization in  the morning and at bedtime. 750 mL 12   Spacer/Aero-Holding Chambers (AEROCHAMBER MV) inhaler Use as instructed 1 each 0   sucralfate (CARAFATE) 1 G tablet Take 1 g by mouth as needed. Pt only takes when she's really needs it which isn't very often     dextromethorphan-guaiFENesin  (MUCINEX  DM) 30-600 MG 12hr tablet Take 1 tablet by mouth 2 (two) times daily. (Patient not taking: Reported on 02/02/2024)     ipratropium (ATROVENT ) 0.03 % nasal spray Place 2 sprays into both nostrils every 12 (twelve) hours. (Patient not taking: Reported on 02/02/2024)     metoprolol tartrate (LOPRESSOR) 25 MG tablet Take one tablet two hours prior to the test (Patient not taking: Reported on 02/02/2024) 1 tablet 0   cetirizine (ZYRTEC) 10 MG tablet Take 10 mg by mouth daily as needed for allergies. (Patient not taking: Reported on 01/14/2024)     cyclobenzaprine  (FLEXERIL ) 5 MG tablet Take 1 tablet (5 mg total) by mouth at bedtime. (Patient taking differently: Take 5 mg by mouth as needed.) 14 tablet 1   guaiFENesin -codeine  (VIRTUSSIN A/C) 100-10 MG/5ML syrup Take 5 mLs by mouth at bedtime and may repeat dose one time if needed. 150 mL 0   No facility-administered medications prior to visit.    Review of Systems;  Patient denies headache, fevers, malaise, unintentional weight loss,  skin rash, eye pain, sinus congestion and sinus pain, sore throat, dysphagia,  hemoptysis , cough, dyspnea, wheezing, chest pain, palpitations, orthopnea, edema, abdominal pain, nausea, melena, diarrhea, constipation, flank pain, dysuria, hematuria, urinary  Frequency, nocturia, numbness, tingling, seizures,  Focal weakness, Loss of consciousness,  Tremor, insomnia, depression, anxiety, and suicidal ideation.      Objective:  BP 130/64   Pulse (!) 57   Ht 5' 3 (1.6 m)   Wt 142 lb 3.2 oz (64.5 kg)   SpO2 97%   BMI 25.19 kg/m   BP Readings from Last 3 Encounters:  02/02/24 130/64  02/02/24 (!) 124/56  01/14/24 110/62    Wt  Readings from Last 3 Encounters:  02/02/24 142 lb 3.2 oz (64.5 kg)  01/14/24 142 lb 12.8 oz (64.8 kg)  01/13/24 144 lb 4 oz (65.4 kg)    Physical Exam  Lab Results  Component Value Date   HGBA1C 6.2 12/30/2023    Lab Results  Component Value Date   CREATININE 0.88 01/07/2024   CREATININE 0.98 12/30/2023   CREATININE 0.80 08/25/2023    Lab Results  Component Value Date   WBC 5.3 01/15/2024   HGB 12.9 01/15/2024   HCT 38.8 01/15/2024   PLT 181 01/15/2024   GLUCOSE 92 01/07/2024   CHOL 185 12/30/2023   TRIG 71.0 12/30/2023   HDL 78.20 12/30/2023   LDLDIRECT 101.0 12/30/2023   LDLCALC 93 12/30/2023   ALT 12 12/30/2023   AST 23 12/30/2023   NA 139 01/07/2024   K 4.2 01/07/2024   CL 100 01/07/2024   CREATININE 0.88 01/07/2024   BUN 12 01/07/2024   CO2 31 01/07/2024   TSH 0.68 12/30/2023   INR 1.0 08/25/2023   HGBA1C 6.2 12/30/2023    No results found.  Assessment & Plan:  .There are no diagnoses linked to this encounter.   I spent 34 minutes on the day of this face to face encounter reviewing patient's  most recent visit with cardiology,  nephrology,  and neurology,  prior relevant surgical and non surgical procedures, recent  labs and imaging studies, counseling on weight management,  reviewing the assessment and plan with patient, and post visit ordering and reviewing of  diagnostics and therapeutics with patient  .   Follow-up: No follow-ups on file.   Verneita LITTIE Kettering, MD

## 2024-02-02 NOTE — Patient Instructions (Addendum)
 Try using Beano with meals   containing vegetables   and substitute mylanta gas for carafate  Consider resuming atrovent  to determine if post nasal drip /vasomotor rhinitis is contributing   ZIO monitor   to assess heart rate/rhythm during activities   You can try mucinex  for a few days to see if it helps clear your throat

## 2024-02-03 NOTE — Assessment & Plan Note (Signed)
 Encouraged to repeat trial of atrovent ,  which she stopped after seeing her pulmonologist

## 2024-02-03 NOTE — Assessment & Plan Note (Signed)
 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

## 2024-02-03 NOTE — Assessment & Plan Note (Signed)
 ZIO monitor reordered given bradycardia on exam to rule out inappropriate bradycardia which may indicate SSS  .  Cardiology appt planned

## 2024-02-03 NOTE — Assessment & Plan Note (Signed)
 Recommend stopping carafate and using PPI

## 2024-02-05 ENCOUNTER — Encounter: Payer: Self-pay | Admitting: Nurse Practitioner

## 2024-02-05 ENCOUNTER — Ambulatory Visit: Attending: Nurse Practitioner | Admitting: Nurse Practitioner

## 2024-02-05 VITALS — BP 128/60 | HR 60 | Ht 63.0 in | Wt 142.2 lb

## 2024-02-05 DIAGNOSIS — E782 Mixed hyperlipidemia: Secondary | ICD-10-CM

## 2024-02-05 DIAGNOSIS — I25118 Atherosclerotic heart disease of native coronary artery with other forms of angina pectoris: Secondary | ICD-10-CM | POA: Diagnosis not present

## 2024-02-05 DIAGNOSIS — R072 Precordial pain: Secondary | ICD-10-CM

## 2024-02-05 DIAGNOSIS — I471 Supraventricular tachycardia, unspecified: Secondary | ICD-10-CM

## 2024-02-05 DIAGNOSIS — R002 Palpitations: Secondary | ICD-10-CM

## 2024-02-05 DIAGNOSIS — R0602 Shortness of breath: Secondary | ICD-10-CM | POA: Diagnosis not present

## 2024-02-05 DIAGNOSIS — R001 Bradycardia, unspecified: Secondary | ICD-10-CM

## 2024-02-05 NOTE — Patient Instructions (Signed)
 Medication Instructions:  Your physician recommends that you continue on your current medications as directed. Please refer to the Current Medication list given to you today.   *If you need a refill on your cardiac medications before your next appointment, please call your pharmacy*  Lab Work: No labs ordered today  If you have labs (blood work) drawn today and your tests are completely normal, you will receive your results only by: MyChart Message (if you have MyChart) OR A paper copy in the mail If you have any lab test that is abnormal or we need to change your treatment, we will call you to review the results.  Testing/Procedures: No test ordered today   Follow-Up: At [facility], you and your health needs are our priority.  As part of our continuing mission to provide you with exceptional heart care, our providers are all part of one team.  This team includes your primary Cardiologist (physician) and Advanced Practice Providers or APPs (Physician Assistants and Nurse Practitioners) who all work together to provide you with the care you need, when you need it.  Your next appointment:   3 month(s)  Provider:   Deatrice Cage, MD or Lonni Meager, NP    We recommend signing up for the patient portal called MyChart.  Sign up information is provided on this After Visit Summary.  MyChart is used to connect with patients for Virtual Visits (Telemedicine).  Patients are able to view lab/test results, encounter notes, upcoming appointments, etc.  Non-urgent messages can be sent to your provider as well.   To learn more about what you can do with MyChart, go to [email or web address].

## 2024-02-05 NOTE — Progress Notes (Signed)
 Office Visit    Patient Name: Alison Bradshaw Date of Encounter: 02/05/2024  Primary Care Provider:  Marylynn Verneita CROME, MD Primary Cardiologist:  Deatrice Cage, MD    Chief Complaint    83 y.o. female with a history of atypical chest pain, nonobstructive CAD, dyspnea on exertion, PSVT, sinus bradycardia, hyperlipidemia, MAC, asthma, and GERD, who presents for follow-up after recent coronary CT angiogram w/ ongoing DOE, precordial pain, and fatigue.  Past Medical History   Subjective   Past Medical History:  Diagnosis Date   Anxiety    Asthma    Atypical chest pain    Barrett's esophagus    Chronic gastritis    Diastolic dysfunction    a. 08/2020 Echo: EF 60-65%, no rwma, Nl RV fxn, mild MR; b. 01/2024 Echo: EF 55-60%, no rwma, GrI DD, nl RV fxn, mild MR, mild-mod TR.   Diverticulosis    Duodenal ulcer    Gastritis    GERD (gastroesophageal reflux disease)    Hiatal hernia    Hyperlipidemia    Mycobacterium avium complex (HCC)    Non-obstructive CAD (coronary artery disease)    a. 10/2017 Stress Echo: Nl EF, no ischemia. Mild MR; b. 12/2017 Cor CTA: LM nl, LAD 50p, 30d, D1/2 nl, RI nl, LCX nl, OM1/2 nl, RCA nl, RPDA/RPL nl. Ca2+ score = 39 (46th percentile)-->statin dose escalated; c. 10/2021 MV: No ischemia/infarct. Nl LV fxn; d. 01/2024 Cor CTA: Ca2+ = 62.8 (38th%'ile). LM nl, LAD <25, LCX nl, RCA nl.   Osteopenia    Peptic ulcer, site unspecified, unspecified as acute or chronic, without hemorrhage or perforation 11/19/2012   Formatting of this note might be different from the original.  duodenum 1966  Formatting of this note might be different from the original.  Overview:   duodenum 1966   PSVT (paroxysmal supraventricular tachycardia)    a. 10/2017 Holter: PSVT, max 5 beats->did not tolerate beta blocker; b. 09/2020 Zio: 38 runs of SVT. Longest 18.1 secs (106), fastest 203 bpm (6 beats). Occas PVCs (1.1%) - some assoc w/ triggered events; C. 11/2022 Zio: Predominantly  sinus rhythm @ 69 (48-182).  34 runs of PSVT, longest 12 beats.  3.3% PVC burden.  No sustained arrhythmias or A-fib.   Pulmonary nodules    a. 12/2017 Cardiac CTA: RLL 9mm nodule; b. 04/2018 RLL 9mm nodule resolved; c. 09/2018 CT Chest: Previously noted posterior RUL nodules almost completely resolved. Bandlike scarring of RML and lingula-->atyp infxn; d. 11/2022 CT Chest: Clustered bilateral solid pulmonary nodules and groundglass opacities, most pronounced in posterior RUL, RML, and lingula - ? chronic atyp infxn. New 6mm RLL nodule.   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   BREAST BIOPSY Left 2008   neg   BREAST EXCISIONAL BIOPSY Left 2004   neg   CATARACT EXTRACTION W/ INTRAOCULAR LENS  IMPLANT, BILATERAL Bilateral    COLONOSCOPY WITH PROPOFOL  N/A 01/22/2017   Procedure: COLONOSCOPY WITH PROPOFOL ;  Surgeon: Toledo, Ladell POUR, MD;  Location: [facility];  Service: Gastroenterology;  Laterality: N/A;   COLONOSCOPY WITH PROPOFOL  N/A 03/26/2022   Procedure: COLONOSCOPY WITH PROPOFOL ;  Surgeon:  Maryruth Ole DASEN, MD;  Location: [facility] END[facility];  Service: Endoscopy;  Laterality: N/A;   ESOPHAGOGASTRODUODENOSCOPY (EGD) WITH PROPOFOL  N/A 01/20/2015   Procedure: ESOPHAGOGASTRODUODENOSCOPY (EGD) WITH PROPOFOL ;  Surgeon: Gladis RAYMOND Mariner, MD;  Location: [facility] ENDOSCOPY;  Service: Endoscopy;  Laterality: N/A;   ESOPHAGOGASTRODUODENOSCOPY (EGD) WITH PROPOFOL  N/A 01/22/2017   Procedure: ESOPHAGOGASTRODUODENOSCOPY (EGD) WITH PROPOFOL ;  Surgeon: Toledo, Ladell POUR, MD;  Location: [facility] ENDOSCOPY;  Service: Gastroenterology;  Laterality: N/A;   ESOPHAGOGASTRODUODENOSCOPY  (EGD) WITH PROPOFOL  N/A 04/14/2018   Procedure: ESOPHAGOGASTRODUODENOSCOPY (EGD) WITH PROPOFOL ;  Surgeon: Mariner Gladis RAYMOND, MD;  Location: [facility] ENDOSCOPY;  Service: Endoscopy;  Laterality: N/A;   ESOPHAGOGASTRODUODENOSCOPY (EGD) WITH PROPOFOL  N/A 10/16/2021   Procedure: ESOPHAGOGASTRODUODENOSCOPY (EGD) WITH PROPOFOL ;  Surgeon: Maryruth Ole DASEN, MD;  Location: [facility] ENDOSCOPY;  Service: Endoscopy;  Laterality: N/A;   EYE SURGERY Right    laser scraping   OPEN REDUCTION INTERNAL FIXATION (ORIF) DISTAL RADIAL FRACTURE Right 02/09/2018   Procedure: OPEN REDUCTION INTERNAL FIXATION (ORIF) DISTAL RADIAL FRACTURE;  Surgeon: Kathlynn Sharper, MD;  Location: [facility] ORS;  Service: Orthopedics;  Laterality: Right;   TONSILLECTOMY      Allergies  Allergies  Allergen Reactions   Percocet [Oxycodone -Acetaminophen ] Nausea Only   Ceftin [Cefuroxime Axetil] Hives and Rash   Timentin [Ticarcillin-Pot Clavulanate] Hives and Rash       History of Present Illness      83 y.o. y/o female with above past medical history including atypical chest pain, nonobstructive CAD, PSVT, hyperlipidemia, sinus bradycardia, MAC, asthma, and GERD. She was evaluated in 2019 for atypical chest pain with echo showing normal LV function. Coronary CT angiogram in September 2019, showed a calcium  score of 39 with mild nonobstructive CAD in the LAD distribution. At the time, she also reported palpitations, and a Holter monitor showed short runs of SVT. She tried beta-blocker therapy but this was discontinued secondary to complaints of dizziness. In April 2022, she reported palpitations and presyncope. Echo in May 2022 showed an EF of 60 to 65% without regional wall motion abnormalities, normal RV function, and mild MR. Zio monitoring in June 2022, showed 38 runs of SVT, the longest of which was 18.1 seconds at a rate of 106 bpm. Fastest episode consisted of 6 beats at 203 bpm. She was also noted to have occasional PVCs at a 1.1% burden.  Recommendation was made to consider low-dose metoprolol however, we agreed to defer in the setting of baseline bradycardia and soft blood pressures. In the spring 2023, she began to experience intermittent abdominal discomfort that radiated into her precordium and throat. She was initially seen in the emergency department with unremarkable work-up and then was seen in clinic here. She underwent a Lexiscan  Myoview  which showed normal LV function and no evidence of ischemia or infarct. No significant aortic or coronary calcifications were noted.   Ms. Krukowski has multiple emergency department visits for abdominal disco in 2023 mfort.  Initially, imaging was unremarkable, and she was treated for UTI without improvement in symptoms.  Pelvic ultrasound showed possible thickening of the endometrium however, subsequent biopsy was negative.  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  She had worsening right lower quadrant pain and tenderness on April 07, 2022, and repeat CT suggested possible acute appendicitis.  She was seen by general surgery.  It was felt that presentation was not consistent with acute appendicitis.  Patient was not interested in diagnostic laparoscopy.  She was also seen by vascular surgery due to concern for mesenteric ischemia however, mesenteric duplex showed  normal velocities in the celiac, superior mesenteric, and inferior mesenteric arteries.  Ms. Schnake saw pulmonology in 11/2022 due to dyspnea and sneezing w/ prior abnormal chest CT.  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  There was a new irregular solid pulmonary nodule in the right lower lobe measuring 6 mm with recommendation for follow-up in 3 to 6 months.  In the setting of palpitations, Zio monitoring was also placed in August 2024, which showed predominantly sinus rhythm with 34  brief runs of non-sustained SVT, and 3.3% PVC burden.  No A-fib or sustained arrhythmias were noted.     Ms. Daily was last seen in cardiology clinic on January 13, 2024, at which time she reported progressive dyspnea on exertion and intermittent exertional chest heaviness and fatigue.  A BNP prior to that visit was mildly elevated at 127 and an echo was ordered by pulmonology.  This subsequently showed an EF of 55 to 60%, grade 1 diastolic dysfunction, normal RV function, mild MR, and mild to moderate TR.  Coronary CT angiogram was also carried out showing a calcium  score 62.8 (30th percentile), and less than 25% LAD stenosis.  Ms. Slates was seen by her primary care provider on October 27 in the setting of ongoing concerns related to dyspnea, fatigue, and sinus bradycardia, a ZIO monitor was ordered, though pt has received yet.  She continues to have daily episodes of chest heaviness followed by profound fatigue and low energy state that she says persists for 30 to 50% of the entire day.  In the setting of fatigue episodes, she often experiences air hunger and sigh breathing but not necessarily dyspnea on exertion.  She now naps frequently throughout the day and has not been exercising.  She denies palpitations, PND, orthopnea, dizziness, syncope, edema, or early satiety.  We discussed her echo and coronary CT angiogram results in detail today.  All questions answered.  Ambulatory pulse oximetry showed normal saturation with appropriate elevation in heart rate to 85.   Objective   Home Medications    Current Outpatient Medications  Medication Sig Dispense Refill   acetaminophen  (TYLENOL ) 500 MG tablet Take 1,000 mg by mouth every 6 (six) hours as needed.     albuterol  (PROVENTIL ) (2.5 MG/3ML) 0.083% nebulizer solution Take 3 mLs (2.5 mg total) by nebulization in the morning and at bedtime. 75 mL 11   albuterol  (VENTOLIN  HFA) 108 (90 Base) MCG/ACT inhaler Inhale 2 puffs into the lungs every 6  (six) hours as needed for shortness of breath. 3 each 0   ALPRAZolam  (XANAX ) 0.25 MG tablet Take 1 tablet (0.25 mg total) by mouth 2 (two) times daily as needed for anxiety. 45 tablet 5   benzonatate  (TESSALON ) 100 MG capsule Take 1 capsule (100 mg total) by mouth 3 (three) times daily as needed for cough. 21 capsule 0   budesonide -formoterol  (SYMBICORT ) 160-4.5 MCG/ACT inhaler Inhale 2 puffs into the lungs in the morning and at bedtime. 1 each 12   clindamycin  (CLEOCIN  T) 1 % lotion Apply topically.     cycloSPORINE (RESTASIS) 0.05 % ophthalmic emulsion Place 1 drop into both eyes 2 (two) times daily.     fluticasone  (FLONASE ) 50 MCG/ACT nasal spray Place 1 spray into both nostrils in the morning and at bedtime. 100 mL 2   pantoprazole  (PROTONIX ) 40 MG tablet TAKE 1 TABLET(40 MG) BY MOUTH DAILY 90 tablet 3  rosuvastatin  (CRESTOR ) 10 MG tablet TAKE 1 TABLET(10 MG) BY MOUTH DAILY 90 tablet 1   sodium chloride  HYPERTONIC 3 % nebulizer solution Take by nebulization in the morning and at bedtime. 750 mL 12   Spacer/Aero-Holding Chambers (AEROCHAMBER MV) inhaler Use as instructed 1 each 0   sucralfate (CARAFATE) 1 g tablet Take 1 tablet (1 g total) by mouth as needed. 90 tablet 1   dextromethorphan-guaiFENesin  (MUCINEX  DM) 30-600 MG 12hr tablet Take 1 tablet by mouth 2 (two) times daily. (Patient not taking: Reported on 02/05/2024)     ipratropium (ATROVENT ) 0.03 % nasal spray Place 2 sprays into both nostrils every 12 (twelve) hours. (Patient not taking: Reported on 02/05/2024)     mometasone (ELOCON) 0.1 % lotion  (Patient not taking: Reported on 02/05/2024)     No current facility-administered medications for this visit.     Physical Exam    VS:  BP 128/60 (BP Location: Left Arm, Patient Position: Sitting, Cuff Size: Normal)   Pulse 60   Ht 5' 3 (1.6 m)   Wt 142 lb 4 oz (64.5 kg)   SpO2 98%   BMI 25.20 kg/m  , BMI Body mass index is 25.2 kg/m.          GEN: Well nourished, well  developed, in no acute distress. HEENT: normal. Neck: Supple, no JVD, carotid bruits, or masses. Cardiac: RRR, no murmurs, rubs, or gallops. No clubbing, cyanosis, edema.  Radials 2+/PT 2+ and equal bilaterally.  Respiratory:  Respirations regular and unlabored, clear to auscultation bilaterally. GI: Soft, nontender, nondistended, BS + x 4. MS: no deformity or atrophy. Skin: warm and dry, no rash. Neuro:  Strength and sensation are intact. Psych: Normal affect.  Accessory Clinical Findings    ECG personally reviewed by me today - EKG Interpretation Date/Time:  Thursday February 05 2024 10:43:17 EDT Ventricular Rate:  60 PR Interval:  148 QRS Duration:  78 QT Interval:  406 QTC Calculation: 406 R Axis:   -7  Text Interpretation: Normal sinus rhythm Normal ECG Confirmed by Vivienne Bruckner [phone]) on 02/05/2024 10:50:41 AM   - no acute changes.  Lab Results  Component Value Date   WBC 5.3 01/15/2024   HGB 12.9 01/15/2024   HCT 38.8 01/15/2024   MCV 88.8 01/15/2024   PLT 181 01/15/2024   Lab Results  Component Value Date   CREATININE 0.88 01/07/2024   BUN 12 01/07/2024   NA 139 01/07/2024   K 4.2 01/07/2024   CL 100 01/07/2024   CO2 31 01/07/2024   Lab Results  Component Value Date   ALT 12 12/30/2023   AST 23 12/30/2023   ALKPHOS 81 12/30/2023   BILITOT 0.7 12/30/2023   Lab Results  Component Value Date   CHOL 185 12/30/2023   HDL 78.20 12/30/2023   LDLCALC 93 12/30/2023   LDLDIRECT 101.0 12/30/2023   TRIG 71.0 12/30/2023   CHOLHDL 2 12/30/2023    Lab Results  Component Value Date   HGBA1C 6.2 12/30/2023   Lab Results  Component Value Date   TSH 0.68 12/30/2023       Assessment & Plan    1.  Chest pain/nonobstructive CAD: Patient with a long history of atypical chest pain with prior negative workups.  She reported more typical symptoms recently prompting repeat coronary CT angiogram earlier this week showed mild nonobstructive LAD disease with a  calcium  score of 62.8 (38 percentile).  Echo w/ nl LV fxn.  We discussed results in detail today.  Radiology over-read still pending.  Thus far, cor CTA findings are reassuring and argue against angina.  She has cont to have almost daily episodes of chest heaviness followed by profound fatigue and has been ordered a zio monitor.  ? Potential role of MAC.  No further ischemic w/u planned.  2.  Dyspnea on exertion/diastolic dysfunction/Fatigue: Recent complaints of progressive dyspnea on exertion and fatigue.  Echocardiogram earlier this month showed an EF of 55 to 60% with grade 1 diastolic dysfunction, mild MR, and mild to moderate TR.  She is also followed by pulmonology in the setting of MAC noted on prior CT and asthma.  In describing symptoms today, though she does have some DOE, she is more concerned about symptoms of air hunger, sigh breathing, and yawning that she experiences during episodes of profound low energy.  Euvolemic on exam today w/ nl ambulatory pulse oximetry.  Await radiology over-read on recent Cor CTA.  She plans to f/u with pulmonology.  3.  PSVT/palpitations: Event monitoring 2024 showed 34 brief runs of SVT, the fastest up to 182 bpm, longest 12 seconds.  Triggered events were mostly associate with sinus rhythm with 1 triggered event associated with ventricular bigeminy.  She is not on AV nodal blocking agent in the setting of history of baseline bradycardia (see below).  Wonder if sudden fatigue symptoms might be associated w/ tachyarrhythmias, though she notes prolonged symptoms, sometimes for an entire day, making this less likely.  Zio monitored ordered by PCP - await results.  4.  Sinus bradycardia: Heart rates in the 50s historically.  ZIO XT ordered earlier this week out of concern for chronotropic incompetence, though she hasn't received yet.  HR today was 60 at rest and rose appropriately to 85 with ambulation.  All intervals are normal.  Await zio (nl heart rate excursion on  monitoring last year).  5.  Hyperlipidemia: On rosuvastatin  with LDL of 93 in September 2025.  In light of known nonobstructive CAD, would like to see LDL less than 70.  We discussed this today, however in light of profound fatigue and ongoing workup, will defer escalation of statin therapy in order to not cloud the picture at this time.  6.  PVCs: 3.3% PVC burden on ZIO monitor last year.  Will reassess with Zio that she is actively wearing.  Prior nonischemic stress test in 2023, nonobstructive coronary disease on CTA earlier this week, and normal LV function by echo earlier this month.  Potassium was 4.2 earlier this month.  See above.  Await repeat monitoring.  7.  MAC/asthma: Followed close by pulmonology.  ? Contribution to DOE.  Await radiology over-read on recent  cor CTA.  8.  Disposition:  Zio pending.  F/u in 3 mos or sooner if monitoring abnl.  Lonni Meager, NP 02/05/2024, 10:52 AM

## 2024-02-06 ENCOUNTER — Ambulatory Visit: Payer: Self-pay | Admitting: Cardiovascular Disease

## 2024-02-06 ENCOUNTER — Ambulatory Visit: Payer: Self-pay

## 2024-02-06 ENCOUNTER — Encounter: Payer: Self-pay | Admitting: Internal Medicine

## 2024-02-06 NOTE — Telephone Encounter (Addendum)
  FYI Only or Action Required?: FYI only for provider: home care.  Patient was last seen in primary care on 02/02/2024 by Marylynn Verneita CROME, MD.  Called Nurse Triage reporting Abdominal Pain and Vomiting.  Symptoms began yesterday.  Interventions attempted: Nothing.  Symptoms are: stable.  Triage Disposition: No disposition on file.  Patient/caregiver understands and will follow disposition?:    Answer Assessment - Initial Assessment Questions 1. REASON FOR CALL: What is the main reason for your call? or How can I best help you?          Patient reports not in any distress or having any abdominal pain.   Patient reports last night only X 1 sudden onset vomiting, suspect food poisoning soon after eating, nausea improving,   No diarrhea, fever or chills, no problems with urination.  Advised call back/ UC if symptoms return/worsen.  Encourage increase in fluid intake/hydration.  Protocols used: Information Only Call - No Triage-A-AH Patient reports did not need appt at this time.   Message from Ahlexyia S sent at 02/06/2024  9:29 AM EDT  Reason for Triage: Pt called in stating that she has been having some abdominal pain. Pt also mentioned that she randomly starting vomiting last night, she stated it was ongoing for an hour. Pt also mentioned that she experienced some nauseas as well.

## 2024-02-06 NOTE — Telephone Encounter (Signed)
 Noted

## 2024-02-09 ENCOUNTER — Ambulatory Visit
Admission: RE | Admit: 2024-02-09 | Discharge: 2024-02-09 | Disposition: A | Discharge status: Home / Self Care | Source: Ambulatory Visit | Attending: Internal Medicine | Admitting: Internal Medicine

## 2024-02-09 DIAGNOSIS — Z1231 Encounter for screening mammogram for malignant neoplasm of breast: Secondary | ICD-10-CM | POA: Insufficient documentation

## 2024-02-09 DIAGNOSIS — K589 Irritable bowel syndrome without diarrhea: Secondary | ICD-10-CM | POA: Diagnosis not present

## 2024-02-09 DIAGNOSIS — R1013 Epigastric pain: Secondary | ICD-10-CM | POA: Diagnosis not present

## 2024-02-10 ENCOUNTER — Encounter: Payer: Self-pay | Admitting: Pulmonary Disease

## 2024-02-10 ENCOUNTER — Ambulatory Visit: Admitting: Pulmonary Disease

## 2024-02-10 VITALS — BP 110/70 | HR 67 | Temp 98.7°F | Ht 63.0 in

## 2024-02-10 DIAGNOSIS — J4540 Moderate persistent asthma, uncomplicated: Secondary | ICD-10-CM

## 2024-02-10 NOTE — Progress Notes (Signed)
 Synopsis: Referred in by Alison Verneita CROME, MD   Subjective:   PATIENT ID: Alison Bradshaw GENDER: female DOB: February 09, 1941, MRN: [id number]  Chief Complaint  Patient presents with   Cough    Coughing is better. Still has congestion and hoarseness. SOB. No wheezing.  Symbicort - BID helps with her breathing. 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 Marylynn Verneita for further evaluation on her asthma.   I initially saw her in August 2024, Patient reports that about 3 months ago she started having labored breathing.  Describes it as feeling of working hard to take a deep breath in.  She notices that usually during hot humid weather.  She denies any wheezing.  She denies any chest pain. She does report seasonal nasal congestion and possible post nasal drip.   We started her on Symbicort  80-4.5 which helped with her breathing. She stopped her symbicort  as it caused her hoarseness of the voice.   She reports feeling better today, still with cough and constant clearance of throat. Using her rescue inhaler once a week. Feels better using the aerochamber device.   PFTs with obstructive defect and good response to BDL. DLCO is normal. Overall consistent with asthma.   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 New solid nodule in the RLL.   Family history - Emphysema in the family   Social History - Never smoker, denies alcohol use, denies any illict drug use. Has a dog at home and worked as a warden/ranger.    OV 06/2023 Dr. Isadora - Seen in clinic by Dr. Isadora for ongoing cough sputum production and extensive runny nose. 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   OV  08/26/2023 - She has since improved significantly. Her shortness of breath and chest tightness has improved what's lingering is the constant feeling of needing to clear her throat. Uses symbicort  160 as prescribed and rarely uses her rescue inhaler. She is seeing ID next week to take their opinion regarding MAC treatment. ACT 22 today.   OV 01/07/2024 - Alison Bradshaw is doing generally well but unfortunately she contracted COVID about a month ago and currently is experiencing fatigue, chest heaviness intermittently and low energy. She states that this comes and goes. 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 I will obtain a CXR, CBC BNP and BMP. Her CXR was clear, CBC and BMP were reassuring. BNP was elevated at 127. I discussed with her in the afternoon and she stated that she was feeling good and was at a hockey stadium watching her granddaughter and went up and down the stairs without any issues. Therefore we deferred referral to the ED for cardiac/PE evaluation. However she was instructed that if she feels that chest heaviness again to go to the ED. Otherwise we will obtain an echocardiogram as outpatient. She was advised to continue with Symbicort  as well as flonase  daily.   OV 01/14/2024 [name] is still feeling the same as before with episodes of exhaustion throughout the day. 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 Her CBC w/ diff is normal only shows slight increase in immature granulocytes. Kidney function and  LFTs are normal. She saw Cardiology recently and is pending a CT coronary and an echocardiogram.   OV 02/10/2024 - Alison Bradshaw is here to follow on multiple issues. She has recovered from her COVID 19 infection and is feeling better. She is being managed for GERD. She is also on symbicort  and flonase  bid. Advised to start taking claritin for allergies as she has been having excessive sneezing in the past few days.   ROS All systems were reviewed and are negative except  for the above.  Objective:   Vitals:   02/10/24 1318  BP: 110/70  Pulse: 67  Temp: 98.7 F (37.1 C)  SpO2: 96%  Height: 5' 3 (1.6 m)   96% on RA BMI Readings from Last 3 Encounters:  02/10/24 25.20 kg/m  02/05/24 25.20 kg/m  02/02/24 25.19 kg/m   Wt Readings from Last 3 Encounters:  02/05/24 142 lb 4 oz (64.5 kg)  02/02/24 142 lb 3.2 oz (64.5 kg)  01/14/24 142 lb 12.8 oz (64.8 kg)    Physical Exam GEN: NAD, Healthy Appearing HEENT: Supple Neck, Reactive Pupils, EOMI, erythematous nasal mucosa. CVS: Normal S1, Normal S2, RRR, No murmurs or ES appreciated  Lungs: Clear bilateral air entry.  Abdomen: Soft, non tender, non distended, + BS  Extremities: Warm and well perfused, No edema   Labs and imaging were reviewed.   Ancillary Information   CBC    Component Value Date/Time   WBC 5.3 01/15/2024 0842   RBC 4.37 01/15/2024 0842   HGB 12.9 01/15/2024 0842   HCT 38.8 01/15/2024 0842   PLT 181 01/15/2024 0842   MCV 88.8 01/15/2024 0842   MCH 29.5 01/15/2024 0842   MCHC 33.2 01/15/2024 0842   RDW 13.4 01/15/2024 0842   LYMPHSABS 1.3 01/15/2024 0842   MONOABS 0.7 01/15/2024 0842   EOSABS 0.1 01/15/2024 0842   BASOSABS 0.0 01/15/2024 0842    PFT 2019 -  FVC: 3.05 L (115 %pred), FEV1: 1.81 L (89 %pred), FEV1/FVC: 60%, TLC: 4.64 L (93 %pred), DLCO 82 %pred  CT chest 09/07/2018  There is bandlike scarring of the right middle lobe and lingula with clustered nodularity of the right upper lobe. Previously noted clustered nodules of the posterior right upper lobe in this vicinity are almost completely resolved. 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 No new nodules are noted.  CT chest 2024 1. Clustered bilateral solid pulmonary nodules and ground-glass opacities most pronounced in the posterior right upper lobe, right middle lobe and lingula, slightly worsened when compared with prior exam with some  pulmonary nodules increased in size and others new. 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 2. New irregular solid pulmonary nodule of the right lower lobe measuring 6 mm. Non-contrast chest CT at 3-6 months is recommended. If the nodules are stable at time of repeat CT, then future CT at 18-24 months (from today's scan) is considered optional for low-risk patients, but is recommended for high-risk patients. This recommendation follows the consensus statement: Guidelines for Management of Incidental Pulmonary Nodules Detected on CT Images: From the Fleischner Society 2017; Radiology 2017; 284:228-243. 3. Mild coronary artery calcifications and aortic Atherosclerosis (ICD10-I70.0).    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 Marylynn Boyer for further evaluation of shortness of breath.  #Moderate persistent asthma without exacerbation  #Granulomatous lung disease? 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  #Allergic rhinitis  #RLL 6 mm nodule  EOS 200  PFTs with obstructive defect and mildly reduced DLCO but when corrected to VA it is normal. Good response to bronchodilators. Latter suggestive of persistent asthma with airway remodeling.   []  C/w Budesonide -Formoterol  [Symbicort ] to 160-4.44mcg 2 puffs twice  a day with aerochamber to limit laryngeal irritation.  []  c/w Albuterol  Inh PRN 2 puffs Q6H  []  c/w Flonase  1 puff each nares daily.  []  Allergen panel and IGE (Normal) []  Start claritin daily for allergies.  []  Will consider starting dupixent if step up therapy is needed.   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 BNP elevated at 127.  []  Echocardiogram [Normal]  []  Cardiology follow up in a week. Pending CT coronary [Reassuring]  []  D-Dimer [Normal]  RTC 6 months   I personally spent a total of 30 minutes in the care of the patient today including preparing to see the patient, getting/reviewing separately obtained history, performing a medically appropriate exam/evaluation, counseling and educating, documenting clinical information in the EHR, independently interpreting results, and communicating results.   Darrin Barn, MD [facility] 02/10/2024 1:40 PM

## 2024-02-11 ENCOUNTER — Other Ambulatory Visit: Payer: Self-pay | Admitting: Internal Medicine

## 2024-02-11 ENCOUNTER — Other Ambulatory Visit: Payer: Self-pay

## 2024-02-11 MED ORDER — SUCRALFATE 1 G PO TABS: 1.0000 g | ORAL_TABLET | Freq: Two times a day (BID) | ORAL | 1 refills | Status: DC

## 2024-02-11 NOTE — Telephone Encounter (Signed)
 Received a message from pharmacy stating that they need clarification on instructions for carafate. Message states that they can not use as needed as an appropriate sig.

## 2024-02-11 NOTE — Telephone Encounter (Signed)
 noted

## 2024-02-15 DIAGNOSIS — J45909 Unspecified asthma, uncomplicated: Secondary | ICD-10-CM | POA: Diagnosis not present

## 2024-02-18 ENCOUNTER — Ambulatory Visit: Admitting: Internal Medicine

## 2024-02-18 DIAGNOSIS — H524 Presbyopia: Secondary | ICD-10-CM | POA: Diagnosis not present

## 2024-02-18 DIAGNOSIS — H16223 Keratoconjunctivitis sicca, not specified as Sjogren's, bilateral: Secondary | ICD-10-CM | POA: Diagnosis not present

## 2024-02-19 DIAGNOSIS — I129 Hypertensive chronic kidney disease with stage 1 through stage 4 chronic kidney disease, or unspecified chronic kidney disease: Secondary | ICD-10-CM | POA: Diagnosis not present

## 2024-02-19 DIAGNOSIS — J4550 Severe persistent asthma, uncomplicated: Secondary | ICD-10-CM | POA: Diagnosis not present

## 2024-02-19 DIAGNOSIS — Z8249 Family history of ischemic heart disease and other diseases of the circulatory system: Secondary | ICD-10-CM | POA: Diagnosis not present

## 2024-02-19 DIAGNOSIS — N1831 Chronic kidney disease, stage 3a: Secondary | ICD-10-CM | POA: Diagnosis not present

## 2024-02-19 DIAGNOSIS — R32 Unspecified urinary incontinence: Secondary | ICD-10-CM | POA: Diagnosis not present

## 2024-02-19 DIAGNOSIS — Z87891 Personal history of nicotine dependence: Secondary | ICD-10-CM | POA: Diagnosis not present

## 2024-02-19 DIAGNOSIS — F419 Anxiety disorder, unspecified: Secondary | ICD-10-CM | POA: Diagnosis not present

## 2024-02-19 DIAGNOSIS — K219 Gastro-esophageal reflux disease without esophagitis: Secondary | ICD-10-CM | POA: Diagnosis not present

## 2024-02-19 DIAGNOSIS — E785 Hyperlipidemia, unspecified: Secondary | ICD-10-CM | POA: Diagnosis not present

## 2024-02-27 ENCOUNTER — Ambulatory Visit: Admitting: Nurse Practitioner

## 2024-02-27 ENCOUNTER — Ambulatory Visit: Payer: Self-pay

## 2024-02-27 ENCOUNTER — Encounter: Payer: Self-pay | Admitting: Nurse Practitioner

## 2024-02-27 VITALS — BP 112/70 | HR 65 | Temp 97.8°F | Ht 63.0 in | Wt 138.8 lb

## 2024-02-27 DIAGNOSIS — R001 Bradycardia, unspecified: Secondary | ICD-10-CM | POA: Diagnosis not present

## 2024-02-27 DIAGNOSIS — J029 Acute pharyngitis, unspecified: Secondary | ICD-10-CM | POA: Diagnosis not present

## 2024-02-27 MED ORDER — AMOXICILLIN-POT CLAVULANATE 875-125 MG PO TABS: 1.0000 | ORAL_TABLET | Freq: Two times a day (BID) | ORAL | 0 refills | Status: DC

## 2024-02-27 MED ORDER — PREDNISONE 20 MG PO TABS: 40.0000 mg | ORAL_TABLET | Freq: Every day | ORAL | 0 refills | Status: AC

## 2024-02-27 NOTE — Progress Notes (Unsigned)
 Established Patient Office Visit  Subjective:  Patient ID: Alison Bradshaw, female    DOB: 26-Jul-1940  Age: 83 y.o. MRN: [id number]  CC:  Chief Complaint  Patient presents with   Headache    Headache, pain at base of neck going into shoulders, pain around right side of lymph nodes up to her right ear, scratchy sore throat and tired  Pain 5/10   Discussed the use of AI scribe software for clinical note transcription with the patient, who gave verbal consent to proceed.  History of Present Illness Alison Bradshaw is an 83 year old female presents  for acute visit with headache and sore throat.  She has had headaches for three to four weeks at the base of her skull, sometimes extending into her neck. The pain is constant and sometimes more irritating. Ice, Tylenol , and massage give partial relief.  Three days ago she developed sharp right-sided throat pain with swallowing that radiates to the ear, described as a painful "marble" sensation with swallowing. She denies fever and cough but has congestion and an intermittently hoarse voice. She uses mometasone in the ear for dryness per ENT.  Over the past two to three weeks she has felt extraordinarily tired and falls asleep unexpectedly. Current medications include fluticasone , Symbicort , and Tylenol  as needed. She had a minor rash with Augmentin  about twenty years ago but tolerated it without issue in 2018.  She has congestion but  denise fever and cough   Past Medical History:  Diagnosis Date   Anxiety    Asthma    Atypical chest pain    Barrett's esophagus    Chronic gastritis    Diastolic dysfunction    a. 08/2020 Echo: EF 60-65%, no rwma, Nl RV fxn, mild MR; b. 01/2024 Echo: EF 55-60%, no rwma, GrI DD, nl RV fxn, mild MR, mild-mod TR.   Diverticulosis    Duodenal ulcer    Gastritis    GERD (gastroesophageal reflux disease)    Hiatal hernia    Hyperlipidemia    Mycobacterium avium complex (HCC)    Non-obstructive CAD  (coronary artery disease)    a. 10/2017 Stress Echo: Nl EF, no ischemia. Mild MR; b. 12/2017 Cor CTA: LM nl, LAD 50p, 30d, D1/2 nl, RI nl, LCX nl, OM1/2 nl, RCA nl, RPDA/RPL nl. Ca2+ score = 39 (46th percentile)-->statin dose escalated; c. 10/2021 MV: No ischemia/infarct. Nl LV fxn; d. 01/2024 Cor CTA: Ca2+ = 62.8 (38th%'ile). LM nl, LAD <25, LCX nl, RCA nl.   Osteopenia    Peptic ulcer, site unspecified, unspecified as acute or chronic, without hemorrhage or perforation 11/19/2012   Formatting of this note might be different from the original.  duodenum 1968  Formatting of this note might be different from the original.  Overview:   duodenum 1966   PSVT (paroxysmal supraventricular tachycardia)    a. 10/2017 Holter: PSVT, max 5 beats->did not tolerate beta blocker; b. 09/2020 Zio: 38 runs of SVT. Longest 18.1 secs (106), fastest 203 bpm (6 beats). Occas PVCs (1.1%) - some assoc w/ triggered events; C. 11/2022 Zio: Predominantly sinus rhythm @ 69 (48-182).  34 runs of PSVT, longest 12 beats.  3.3% PVC burden.  No sustained arrhythmias or A-fib.   Pulmonary nodules    a. 12/2017 Cardiac CTA: RLL 9mm nodule; b. 04/2018 RLL 9mm nodule resolved; c. 09/2018 CT Chest: Previously noted posterior RUL nodules almost completely resolved. Bandlike scarring of RML and lingula-->atyp infxn; d. 11/2022 CT Chest: Clustered bilateral solid  pulmonary nodules and groundglass opacities, most pronounced in posterior RUL, RML, and lingula - ? chronic atyp infxn. New 6mm RLL nodule.   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   BREAST BIOPSY Left 2008   neg   BREAST EXCISIONAL BIOPSY Left 2004    neg   CATARACT EXTRACTION W/ INTRAOCULAR LENS  IMPLANT, BILATERAL Bilateral    COLONOSCOPY WITH PROPOFOL  N/A 01/22/2017   Procedure: COLONOSCOPY WITH PROPOFOL ;  Surgeon: Toledo, Ladell POUR, MD;  Location: [facility] ENDOSCOPY;  Service: Gastroenterology;  Laterality: N/A;   COLONOSCOPY WITH PROPOFOL  N/A 03/26/2022   Procedure: COLONOSCOPY WITH PROPOFOL ;  Surgeon: Maryruth Ole DASEN, MD;  Location: [facility] ENDOSCOPY;  Service: Endoscopy;  Laterality: N/A;   ESOPHAGOGASTRODUODENOSCOPY (EGD) WITH PROPOFOL  N/A 01/20/2015   Procedure: ESOPHAGOGASTRODUODENOSCOPY (EGD) WITH PROPOFOL ;  Surgeon: Gladis RAYMOND Mariner, MD;  Location: [facility] ENDOSCOPY;  Service: Endoscopy;  Laterality: N/A;   ESOPHAGOGASTRODUODENOSCOPY (EGD) WITH PROPOFOL  N/A 01/22/2017   Procedure: ESOPHAGOGASTRODUODENOSCOPY (EGD) WITH PROPOFOL ;  Surgeon: Toledo, Ladell POUR, MD;  Location: [facility] ENDOSCOPY;  Service: Gastroenterology;  Laterality: N/A;   ESOPHAGOGASTRODUODENOSCOPY (EGD) WITH PROPOFOL  N/A 04/14/2018   Procedure: ESOPHAGOGASTRODUODENOSCOPY (EGD) WITH PROPOFOL ;  Surgeon: Mariner Gladis RAYMOND, MD;  Location: [facility] ENDOSCOPY;  Service: Endoscopy;  Laterality: N/A;   ESOPHAGOGASTRODUODENOSCOPY (EGD) WITH PROPOFOL  N/A 10/16/2021   Procedure: ESOPHAGOGASTRODUODENOSCOPY (EGD) WITH PROPOFOL ;  Surgeon: Maryruth Ole DASEN, MD;  Location: [facility] ENDOSCOPY;  Service: Endoscopy;  Laterality: N/A;   EYE SURGERY Right    laser scraping   OPEN REDUCTION INTERNAL FIXATION (ORIF) DISTAL RADIAL FRACTURE Right 02/09/2018   Procedure: OPEN REDUCTION INTERNAL FIXATION (ORIF) DISTAL RADIAL FRACTURE;  Surgeon: Kathlynn Sharper, MD;  Location: [facility] ORS;  Service: Orthopedics;  Laterality: Right;   TONSILLECTOMY      Family History  Problem Relation Age of Onset   Hyperlipidemia Mother    Alzheimer's disease Mother    Emphysema Father    AAA (abdominal aortic aneurysm) Father    Kidney Stones Father    Kidney cancer Brother    Pancreatic cancer Paternal Uncle     Diabetes Paternal Uncle    Stomach cancer Neg Hx    Colon cancer Neg Hx    Esophageal cancer Neg Hx     Social History   Socioeconomic History   Marital status: Widowed    Spouse name: Not on file   Number of children: 2   Years of education: Not on file   Highest education level: Bachelor's degree (e.g., BA, AB, BS)  Occupational History   Occupation: retired  Tobacco Use   Smoking status: Never    Passive exposure: Past   Smokeless tobacco: Never   Tobacco comments:    Quit at age 38, smoked 1 pack a month   Vaping Use   Vaping status: Never Used  Substance and Sexual Activity   Alcohol use: Yes    Alcohol/week: 2.0 standard drinks of alcohol    Types: 2 Glasses of wine per week   Drug use: No   Sexual activity: Not Currently    Partners: Male  Other Topics Concern   Not on file  Social History Narrative  no drug use no alcohol   Social Drivers of Corporate Investment Banker Strain: Low Risk  (01/31/2024)   Overall Financial Resource Strain (CARDIA)    Difficulty of Paying Living Expenses: Not hard at all  Food Insecurity: No Food Insecurity (01/31/2024)   Hunger Vital Sign    Worried About Running Out of Food in the Last Year: Never true    Ran Out of Food in the Last Year: Never true  Transportation Needs: No Transportation Needs (01/31/2024)   PRAPARE - Administrator, Civil Service (Medical): No    Lack of Transportation (Non-Medical): No  Physical Activity: Inactive (01/31/2024)   Exercise Vital Sign    Days of Exercise per Week: 0 days    Minutes of Exercise per Session: Not on file  Stress: Stress Concern Present (01/31/2024)   [employer] of Occupational Health - Occupational Stress Questionnaire    Feeling of Stress: Rather much  Social Connections: Moderately Integrated (01/31/2024)   Social Connection and Isolation Panel    Frequency of Communication with Friends and Family: More than three times a week    Frequency of Social  Gatherings with Friends and Family: Twice a week    Attends Religious Services: More than 4 times per year    Active Member of [employer] or Organizations: Yes    Attends Banker Meetings: 1 to 4 times per year    Marital Status: Widowed  Intimate Partner Violence: Not At Risk (11/10/2023)   Humiliation, Afraid, Rape, and Kick questionnaire    Fear of Current or Ex-Partner: No    Emotionally Abused: No    Physically Abused: No    Sexually Abused: No     Outpatient Medications Prior to Visit  Medication Sig Dispense Refill   acetaminophen  (TYLENOL ) 500 MG tablet Take 1,000 mg by mouth every 6 (six) hours as needed.     albuterol  (VENTOLIN  HFA) 108 (90 Base) MCG/ACT inhaler Inhale 2 puffs into the lungs every 6 (six) hours as needed for shortness of breath. 3 each 0   ALPRAZolam  (XANAX ) 0.25 MG tablet Take 1 tablet (0.25 mg total) by mouth 2 (two) times daily as needed for anxiety. 45 tablet 5   benzonatate  (TESSALON ) 100 MG capsule Take 1 capsule (100 mg total) by mouth 3 (three) times daily as needed for cough. 21 capsule 0   budesonide -formoterol  (SYMBICORT ) 160-4.5 MCG/ACT inhaler Inhale 2 puffs into the lungs in the morning and at bedtime. 1 each 12   clindamycin  (CLEOCIN  T) 1 % lotion Apply topically.     cycloSPORINE (RESTASIS) 0.05 % ophthalmic emulsion Place 1 drop into both eyes 2 (two) times daily.     fluticasone  (FLONASE ) 50 MCG/ACT nasal spray Place 1 spray into both nostrils in the morning and at bedtime. 100 mL 2   pantoprazole  (PROTONIX ) 40 MG tablet TAKE 1 TABLET(40 MG) BY MOUTH DAILY 90 tablet 3   rosuvastatin  (CRESTOR ) 10 MG tablet TAKE 1 TABLET(10 MG) BY MOUTH DAILY 90 tablet 1   Spacer/Aero-Holding Chambers (AEROCHAMBER MV) inhaler Use as instructed 1 each 0   sucralfate  (CARAFATE ) 1 g tablet Take 1 tablet (1 g total) by mouth 2 (two) times daily. 180 tablet 1   albuterol  (PROVENTIL ) (2.5 MG/3ML) 0.083% nebulizer solution Take 3 mLs (2.5 mg total) by nebulization  in the morning and at bedtime. 75 mL 11   ipratropium (ATROVENT ) 0.03 % nasal spray Place 2 sprays into both nostrils every 12 (twelve) hours.  mometasone (ELOCON) 0.1 % lotion      sodium chloride  HYPERTONIC 3 % nebulizer solution Take by nebulization in the morning and at bedtime. 750 mL 12   No facility-administered medications prior to visit.    Allergies  Allergen Reactions   Percocet [Oxycodone -Acetaminophen ] Nausea Only   Ceftin [Cefuroxime Axetil] Hives and Rash   Timentin [Ticarcillin-Pot Clavulanate] Hives and Rash    ROS Review of Systems Negative unless indicated in HPI.    Objective:    Physical Exam Constitutional:      Appearance: She is well-developed.  HENT:     Head: Normocephalic.     Right Ear: Ear canal and external ear normal.     Left Ear: Ear canal and external ear normal.     Mouth/Throat:     Mouth: Mucous membranes are moist.     Pharynx: Posterior oropharyngeal erythema present. No oropharyngeal exudate.     Tonsils: No tonsillar exudate or tonsillar abscesses.  Neck:      Comments: Pea sized lymph node swelling Cardiovascular:     Rate and Rhythm: Normal rate and regular rhythm.  Pulmonary:     Effort: Pulmonary effort is normal.     Breath sounds: Normal breath sounds.  Musculoskeletal:     Cervical back: Normal range of motion.  Skin:    General: Skin is warm.  Neurological:     Mental Status: She is alert and oriented to person, place, and time.  Psychiatric:        Mood and Affect: Mood normal.        Behavior: Behavior normal.     BP 112/70   Pulse 65   Temp 97.8 F (36.6 C)   Ht 5' 3 (1.6 m)   Wt 138 lb 12.8 oz (63 kg)   SpO2 98%   BMI 24.59 kg/m  Wt Readings from Last 3 Encounters:  03/01/24 142 lb 9.6 oz (64.7 kg)  02/27/24 138 lb 12.8 oz (63 kg)  02/05/24 142 lb 4 oz (64.5 kg)     Health Maintenance  Topic Date Due   Medicare Annual Wellness (AWV)  11/09/2024   Mammogram  02/08/2025   DTaP/Tdap/Td (3 -  Td or Tdap) 10/06/2032   Pneumococcal Vaccine: 50+ Years  Completed   Influenza Vaccine  Completed   Bone Density Scan  Completed   Zoster Vaccines- Shingrix  Completed   Meningococcal B Vaccine  Aged Out   Colonoscopy  Discontinued   COVID-19 Vaccine  Discontinued    There are no preventive care reminders to display for this patient.  Lab Results  Component Value Date   TSH 0.68 12/30/2023   Lab Results  Component Value Date   WBC 5.3 01/15/2024   HGB 12.9 01/15/2024   HCT 38.8 01/15/2024   MCV 88.8 01/15/2024   PLT 181 01/15/2024   Lab Results  Component Value Date   NA 139 01/07/2024   K 4.2 01/07/2024   CO2 31 01/07/2024   GLUCOSE 92 01/07/2024   BUN 12 01/07/2024   CREATININE 0.88 01/07/2024   BILITOT 0.7 12/30/2023   ALKPHOS 81 12/30/2023   AST 23 12/30/2023   ALT 12 12/30/2023   PROT 7.2 12/30/2023   ALBUMIN 4.6 12/30/2023   CALCIUM  9.4 01/07/2024   ANIONGAP 8 01/07/2024   GFR 54.23 (L) 12/30/2023   Lab Results  Component Value Date   CHOL 185 12/30/2023   Lab Results  Component Value Date   HDL 78.20 12/30/2023   Lab  Results  Component Value Date   LDLCALC 93 12/30/2023   Lab Results  Component Value Date   TRIG 71.0 12/30/2023   Lab Results  Component Value Date   CHOLHDL 2 12/30/2023   Lab Results  Component Value Date   HGBA1C 6.2 12/30/2023      Assessment & Plan:   Assessment & Plan Sorethroat  Orders:   POCT rapid strep A  Pharyngitis, unspecified etiology Acute pharyngitis with cervical lymphadenopathy, negative strep test. - Will treat with Augmentin  and prednisone . - Advised to stop medication and seek evaluation if reaction occurs. -Recommended warm compresses and salt water gargles.      Assessment and Plan Assessment & Plan    Follow-up: No follow-ups on file.   Kimberli Winne, NP

## 2024-02-27 NOTE — Telephone Encounter (Signed)
 Noted

## 2024-02-27 NOTE — Telephone Encounter (Signed)
 FYI Only or Action Required?: FYI only for provider: appointment scheduled on 02/27/2024.  Patient was last seen in primary care on 02/02/2024 by Marylynn Verneita CROME, MD.  Called Nurse Triage reporting Headache.  Symptoms began several weeks ago.  Interventions attempted: OTC medications: Cream and Tylenol .  Symptoms are: gradually worsening.  Triage Disposition: See PCP When Office is Open (Within 3 Days)  Patient/caregiver understands and will follow disposition?: Yes           Copied from CRM [id number]. Topic: Clinical - Red Word Triage >> Feb 27, 2024  8:09 AM Ivette P wrote: [facility] that prompted transfer to Nurse Triage: headache - ear and right side of ache   - hurts when i swallow. headache nirsing with tylenol  - and congested Reason for Disposition  [1] MILD-MODERATE headache AND [2] present > 3 days (72 hours) AND [3] no improvement after using Care Advice  Answer Assessment - Initial Assessment Questions 1. LOCATION: Where does it hurt?      Base of head and neck goes to her forehead as well  2. ONSET: When did the headache start? (e.g., minutes, hours, days)      3 weeks  3. PATTERN: Does the pain come and go, or has it been constant since it started?     Ear pain constant, pain in head comes and goes  4. SEVERITY: How bad is the pain? and What does it keep you from doing?  (e.g., Scale 1-10; mild, moderate, or severe)     Between a 2-6 out of 10 and varies  5. RECURRENT SYMPTOM: Have you ever had headaches before? If Yes, ask: When was the last time? and What happened that time?      No  6. CAUSE: What do you think is causing the headache?     Possible sinus  7. MIGRAINE: Have you been diagnosed with migraine headaches? If Yes, ask: Is this headache similar?      Denies  8. HEAD INJURY: Has there been any recent injury to your head?      Denies  9. OTHER SYMPTOMS: Do you have any other symptoms? (e.g., fever, stiff neck, eye pain,  sore throat, cold symptoms)     Pain with swallowing, congestion, neck pain, shoulder pain  Used otc cream and Tylenol  for symptoms. Pain also travels from ear to neck when swallowing.  Protocols used: [facility]

## 2024-03-01 ENCOUNTER — Encounter: Payer: Self-pay | Admitting: Internal Medicine

## 2024-03-01 ENCOUNTER — Ambulatory Visit: Admitting: Internal Medicine

## 2024-03-01 VITALS — BP 122/68 | HR 50 | Ht 63.0 in | Wt 142.6 lb

## 2024-03-01 DIAGNOSIS — T50905A Adverse effect of unspecified drugs, medicaments and biological substances, initial encounter: Secondary | ICD-10-CM

## 2024-03-01 DIAGNOSIS — J028 Acute pharyngitis due to other specified organisms: Secondary | ICD-10-CM

## 2024-03-01 DIAGNOSIS — R10A1 Flank pain, right side: Secondary | ICD-10-CM

## 2024-03-01 DIAGNOSIS — R059 Cough, unspecified: Secondary | ICD-10-CM | POA: Diagnosis not present

## 2024-03-01 DIAGNOSIS — R7303 Prediabetes: Secondary | ICD-10-CM

## 2024-03-01 DIAGNOSIS — Z87898 Personal history of other specified conditions: Secondary | ICD-10-CM

## 2024-03-01 DIAGNOSIS — D7289 Other specified disorders of white blood cells: Secondary | ICD-10-CM

## 2024-03-01 LAB — URINALYSIS, ROUTINE W REFLEX MICROSCOPIC
Bilirubin Urine: NEGATIVE
Hgb urine dipstick: NEGATIVE
Ketones, ur: NEGATIVE
Leukocytes,Ua: NEGATIVE
Nitrite: NEGATIVE
RBC / HPF: NONE SEEN (ref 0–?)
Specific Gravity, Urine: <=1.005 — AB (ref 1.000–1.030)
Total Protein, Urine: NEGATIVE
Urine Glucose: NEGATIVE
Urobilinogen, UA: 0.2 (ref 0.0–1.0)
WBC, UA: NONE SEEN (ref 0–?)
pH: 6.5 (ref 5.0–8.0)

## 2024-03-01 LAB — POCT RAPID STREP A (OFFICE): Rapid Strep A Screen: POSITIVE — AB

## 2024-03-01 MED ORDER — NYSTATIN 100000 UNIT/ML MT SUSP: 5.0000 mL | Freq: Four times a day (QID) | OROMUCOSAL | 0 refills | Status: DC

## 2024-03-01 NOTE — Progress Notes (Unsigned)
 Subjective:  Patient ID: Alison Bradshaw, female    DOB: 03-25-1941  Age: 83 y.o. MRN: [id number]  CC: There were no encounter diagnoses.   HPI Alison Bradshaw presents for  Chief Complaint  Patient presents with  . Medical Management of Chronic Issues    4 week follow up    HPI:  Alison Bradshaw is an 83 yr old female with a history of intermittent asthma, Zencker's diverticulum who was treated empirically on Nov 21 with augmentin  and prednisone  for pharyngitis with unknown result of  POC for Strep .  Accompaniedy by right ear pain ,  cervical LAD patient has been taking the augmentin  since Nov 21 evening .EAr and neck pain have improved,  but now throat is sore.   Taking flonase  per [place],  and zyrtec    Earlier in the month (OCT31) she had a self limited episode of Nausea & vomiting  Breathing better with scheduled use of fluticasone  and symbicort     Outpatient Medications Prior to Visit  Medication Sig Dispense Refill  . acetaminophen  (TYLENOL ) 500 MG tablet Take 1,000 mg by mouth every 6 (six) hours as needed.    . albuterol  (PROVENTIL ) (2.5 MG/3ML) 0.083% nebulizer solution Take 3 mLs (2.5 mg total) by nebulization in the morning and at bedtime. 75 mL 11  . albuterol  (VENTOLIN  HFA) 108 (90 Base) MCG/ACT inhaler Inhale 2 puffs into the lungs every 6 (six) hours as needed for shortness of breath. 3 each 0  . ALPRAZolam  (XANAX ) 0.25 MG tablet Take 1 tablet (0.25 mg total) by mouth 2 (two) times daily as needed for anxiety. 45 tablet 5  . amoxicillin -clavulanate (AUGMENTIN ) 875-125 MG tablet Take 1 tablet by mouth 2 (two) times daily. 20 tablet 0  . benzonatate  (TESSALON ) 100 MG capsule Take 1 capsule (100 mg total) by mouth 3 (three) times daily as needed for cough. 21 capsule 0  . budesonide -formoterol  (SYMBICORT ) 160-4.5 MCG/ACT inhaler Inhale 2 puffs into the lungs in the morning and at bedtime. 1 each 12  . clindamycin  (CLEOCIN  T) 1 % lotion Apply topically.    . cycloSPORINE  (RESTASIS) 0.05 % ophthalmic emulsion Place 1 drop into both eyes 2 (two) times daily.    . fluticasone  (FLONASE ) 50 MCG/ACT nasal spray Place 1 spray into both nostrils in the morning and at bedtime. 100 mL 2  . ipratropium (ATROVENT ) 0.03 % nasal spray Place 2 sprays into both nostrils every 12 (twelve) hours.    . mometasone (ELOCON) 0.1 % lotion     . pantoprazole  (PROTONIX ) 40 MG tablet TAKE 1 TABLET(40 MG) BY MOUTH DAILY 90 tablet 3  . predniSONE  (DELTASONE ) 20 MG tablet Take 2 tablets (40 mg total) by mouth daily with breakfast for 5 days. 10 tablet 0  . rosuvastatin  (CRESTOR ) 10 MG tablet TAKE 1 TABLET(10 MG) BY MOUTH DAILY 90 tablet 1  . sodium chloride  HYPERTONIC 3 % nebulizer solution Take by nebulization in the morning and at bedtime. 750 mL 12  . Spacer/Aero-Holding Chambers (AEROCHAMBER MV) inhaler Use as instructed 1 each 0  . sucralfate  (CARAFATE ) 1 g tablet Take 1 tablet (1 g total) by mouth 2 (two) times daily. 180 tablet 1   No facility-administered medications prior to visit.    Review of Systems;  Patient denies headache, fevers, malaise, unintentional weight loss, skin rash, eye pain, sinus congestion and sinus pain, sore throat, dysphagia,  hemoptysis , cough, dyspnea, wheezing, chest pain, palpitations, orthopnea, edema, abdominal pain, nausea, melena, diarrhea, constipation,  flank pain, dysuria, hematuria, urinary  Frequency, nocturia, numbness, tingling, seizures,  Focal weakness, Loss of consciousness,  Tremor, insomnia, depression, anxiety, and suicidal ideation.      Objective:  BP 122/68   Pulse (!) 50   Ht 5' 3 (1.6 m)   Wt 142 lb 9.6 oz (64.7 kg)   SpO2 99%   BMI 25.26 kg/m   BP Readings from Last 3 Encounters:  03/01/24 122/68  02/27/24 112/70  02/10/24 110/70    Wt Readings from Last 3 Encounters:  03/01/24 142 lb 9.6 oz (64.7 kg)  02/27/24 138 lb 12.8 oz (63 kg)  02/05/24 142 lb 4 oz (64.5 kg)    Physical Exam  Lab Results  Component  Value Date   HGBA1C 6.2 12/30/2023    Lab Results  Component Value Date   CREATININE 0.88 01/07/2024   CREATININE 0.98 12/30/2023   CREATININE 0.80 08/25/2023    Lab Results  Component Value Date   WBC 5.3 01/15/2024   HGB 12.9 01/15/2024   HCT 38.8 01/15/2024   PLT 181 01/15/2024   GLUCOSE 92 01/07/2024   CHOL 185 12/30/2023   TRIG 71.0 12/30/2023   HDL 78.20 12/30/2023   LDLDIRECT 101.0 12/30/2023   LDLCALC 93 12/30/2023   ALT 12 12/30/2023   AST 23 12/30/2023   NA 139 01/07/2024   K 4.2 01/07/2024   CL 100 01/07/2024   CREATININE 0.88 01/07/2024   BUN 12 01/07/2024   CO2 31 01/07/2024   TSH 0.68 12/30/2023   INR 1.0 08/25/2023   HGBA1C 6.2 12/30/2023    MM 3D SCREENING MAMMOGRAM BILATERAL BREAST Result Date: 02/11/2024 CLINICAL DATA:  Screening. EXAM: DIGITAL SCREENING BILATERAL MAMMOGRAM WITH TOMOSYNTHESIS AND CAD TECHNIQUE: Bilateral screening digital craniocaudal and mediolateral oblique mammograms were obtained. Bilateral screening digital breast tomosynthesis was performed. The images were evaluated with computer-aided detection. COMPARISON:  Previous exam(s). ACR Breast Density Category c: The breasts are heterogeneously dense, which may obscure small masses. FINDINGS: There are no findings suspicious for malignancy. IMPRESSION: No mammographic evidence of malignancy. A result letter of this screening mammogram will be mailed directly to the patient. RECOMMENDATION: Screening mammogram in one year. (Code:SM-B-01Y) BI-RADS CATEGORY  1: Negative. Electronically Signed   By: Norleen Croak M.D.   On: 02/11/2024 08:13    Assessment & Plan:  .There are no diagnoses linked to this encounter.   I spent 34 minutes on the day of this face to face encounter reviewing patient's  most recent visit with cardiology,  nephrology,  and neurology,  prior relevant surgical and non surgical procedures, recent  labs and imaging studies, counseling on weight management,  reviewing the  assessment and plan with patient, and post visit ordering and reviewing of  diagnostics and therapeutics with patient  .   Follow-up: No follow-ups on file.   Verneita LITTIE Kettering, MD

## 2024-03-01 NOTE — Patient Instructions (Addendum)
 Add the atrovent  nasal spray to your current regimen of flonase .  Consider adding zyrtec  once daily.    Gargle with salt water a couple times daily to help the sore throat    start the the Nystatin  oral suspension  which would treat thrush  (which can happen with use of steroid inhalers)   For the post nasal persistent drip:  You should try [employer]'s Sinus rinse once daily ;  It is a stong sinus flush using water and medicated salts.  Do it over the sink because it can be a bit messy   Try adding benadryl  just at night   Referral for allergy testing to [place] underway  The labs that needed repeating cannot be done until you have been off of prednisone  for at least 2 weeks  so please schedule a lab appt  at the appropriate time

## 2024-03-02 ENCOUNTER — Ambulatory Visit: Admitting: Nurse Practitioner

## 2024-03-02 ENCOUNTER — Ambulatory Visit: Payer: Self-pay | Admitting: Internal Medicine

## 2024-03-02 DIAGNOSIS — Z87898 Personal history of other specified conditions: Secondary | ICD-10-CM | POA: Insufficient documentation

## 2024-03-02 DIAGNOSIS — J029 Acute pharyngitis, unspecified: Secondary | ICD-10-CM | POA: Insufficient documentation

## 2024-03-02 NOTE — Assessment & Plan Note (Signed)
 No improvement with augmentin .  Will treat for thrush and PND

## 2024-03-02 NOTE — Assessment & Plan Note (Signed)
 She has no evidence of UTI

## 2024-03-04 NOTE — Assessment & Plan Note (Addendum)
 Acute pharyngitis with cervical lymphadenopathy, negative strep test. - Will treat with Augmentin  and prednisone . - Advised to stop medication and seek evaluation if reaction occurs. -Recommended warm compresses and salt water gargles.

## 2024-03-05 DIAGNOSIS — R001 Bradycardia, unspecified: Secondary | ICD-10-CM

## 2024-03-06 ENCOUNTER — Ambulatory Visit: Payer: Self-pay | Admitting: Internal Medicine

## 2024-03-12 ENCOUNTER — Encounter: Payer: Self-pay | Admitting: Nurse Practitioner

## 2024-03-12 ENCOUNTER — Ambulatory Visit

## 2024-03-12 ENCOUNTER — Ambulatory Visit: Admitting: Nurse Practitioner

## 2024-03-12 VITALS — BP 118/68 | HR 58 | Temp 97.5°F | Ht 63.0 in | Wt 142.0 lb

## 2024-03-12 DIAGNOSIS — N760 Acute vaginitis: Secondary | ICD-10-CM | POA: Diagnosis not present

## 2024-03-12 DIAGNOSIS — R918 Other nonspecific abnormal finding of lung field: Secondary | ICD-10-CM | POA: Diagnosis not present

## 2024-03-12 DIAGNOSIS — J069 Acute upper respiratory infection, unspecified: Secondary | ICD-10-CM

## 2024-03-12 DIAGNOSIS — R059 Cough, unspecified: Secondary | ICD-10-CM | POA: Diagnosis not present

## 2024-03-12 DIAGNOSIS — M542 Cervicalgia: Secondary | ICD-10-CM

## 2024-03-12 LAB — POC COVID19 BINAXNOW: SARS Coronavirus 2 Ag: NEGATIVE

## 2024-03-12 MED ORDER — DOXYCYCLINE HYCLATE 100 MG PO TABS: 100.0000 mg | ORAL_TABLET | Freq: Two times a day (BID) | ORAL | 0 refills | Status: AC

## 2024-03-12 MED ORDER — FLUCONAZOLE 150 MG PO TABS: 150.0000 mg | ORAL_TABLET | Freq: Once | ORAL | 0 refills | Status: AC

## 2024-03-12 NOTE — Assessment & Plan Note (Addendum)
 Headaches at skull base and forehead with nausea. Possible cervical spine or muscle tension involvement. - Referred to physical therapy. Orders:   Ambulatory referral to Physical Therapy   POC COVID-19 [employer]

## 2024-03-12 NOTE — Progress Notes (Unsigned)
 Established Patient Office Visit  Subjective:  Patient ID: Alison Bradshaw, female    DOB: July 29, 1940  Age: 83 y.o. MRN: [id number]  CC: No chief complaint on file.  Discussed the use of AI scribe software for clinical note transcription with the patient, who gave verbal consent to proceed.  History of Present Illness     Past Medical History:  Diagnosis Date   Anxiety    Asthma    Atypical chest pain    Barrett's esophagus    Chronic gastritis    Diastolic dysfunction    a. 08/2020 Echo: EF 60-65%, no rwma, Nl RV fxn, mild MR; b. 01/2024 Echo: EF 55-60%, no rwma, GrI DD, nl RV fxn, mild MR, mild-mod TR.   Diverticulosis    Duodenal ulcer    Gastritis    GERD (gastroesophageal reflux disease)    Hiatal hernia    Hyperlipidemia    Mycobacterium avium complex (HCC)    Non-obstructive CAD (coronary artery disease)    a. 10/2017 Stress Echo: Nl EF, no ischemia. Mild MR; b. 12/2017 Cor CTA: LM nl, LAD 50p, 30d, D1/2 nl, RI nl, LCX nl, OM1/2 nl, RCA nl, RPDA/RPL nl. Ca2+ score = 39 (46th percentile)-->statin dose escalated; c. 10/2021 MV: No ischemia/infarct. Nl LV fxn; d. 01/2024 Cor CTA: Ca2+ = 62.8 (38th%'ile). LM nl, LAD <25, LCX nl, RCA nl.   Osteopenia    Peptic ulcer, site unspecified, unspecified as acute or chronic, without hemorrhage or perforation 11/19/2012   Formatting of this note might be different from the original.  duodenum 1968  Formatting of this note might be different from the original.  Overview:   duodenum 1968   PSVT (paroxysmal supraventricular tachycardia)    a. 10/2017 Holter: PSVT, max 5 beats->did not tolerate beta blocker; b. 09/2020 Zio: 38 runs of SVT. Longest 18.1 secs (106), fastest 203 bpm (6 beats). Occas PVCs (1.1%) - some assoc w/ triggered events; C. 11/2022 Zio: Predominantly sinus rhythm @ 69 (48-182).  34 runs of PSVT, longest 12 beats.  3.3% PVC burden.  No sustained arrhythmias or A-fib.   Pulmonary nodules    a. 12/2017 Cardiac CTA: RLL 9mm  nodule; b. 04/2018 RLL 9mm nodule resolved; c. 09/2018 CT Chest: Previously noted posterior RUL nodules almost completely resolved. Bandlike scarring of RML and lingula-->atyp infxn; d. 11/2022 CT Chest: Clustered bilateral solid pulmonary nodules and groundglass opacities, most pronounced in posterior RUL, RML, and lingula - ? chronic atyp infxn. New 6mm RLL nodule.   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   BREAST BIOPSY Left 2008   neg   BREAST EXCISIONAL BIOPSY Left 2004   neg   CATARACT EXTRACTION W/ INTRAOCULAR LENS  IMPLANT, BILATERAL Bilateral    COLONOSCOPY WITH PROPOFOL  N/A 01/22/2017   Procedure: COLONOSCOPY WITH PROPOFOL ;  Surgeon: Toledo, Ladell POUR, MD;  Location: [facility] ENDOSCOPY;  Service: Gastroenterology;  Laterality: N/A;   COLONOSCOPY WITH PROPOFOL  N/A 03/26/2022   Procedure: COLONOSCOPY WITH PROPOFOL ;  Surgeon: Maryruth Ole DASEN, MD;  Location: [facility] ENDOSCOPY;  Service: Endoscopy;  Laterality: N/A;   ESOPHAGOGASTRODUODENOSCOPY (EGD) WITH PROPOFOL  N/A 01/20/2015  Procedure: ESOPHAGOGASTRODUODENOSCOPY (EGD) WITH PROPOFOL ;  Surgeon: Gladis RAYMOND Mariner, MD;  Location[facility] END[facility]COPY;  Service: Endoscopy;  Laterality: N/A;   ESOPHAGOGASTRODUODENOSCOPY (EGD) WITH PROPOFOL  N/A 01/22/2017   Procedure: ESOPHAGOGASTRODUODENOSCOPY (EGD) WITH PROPOFOL ;  Surgeon: Toledo, Ladell POUR, MD;  Location: [facility] ENDOSCOPY;  Service: Gastroenterology;  Laterality: N/A;   ESOPHAGOGASTRODUODENOSCOPY (EGD) WITH PROPOFOL  N/A 04/14/2018   Procedure: ESOPHAGOGASTRODUODENOSCOPY (EGD) WITH PROPOFOL ;  Surgeon: Mariner Gladis RAYMOND, MD;  Location: [facility] ENDOSCOPY;  Service:  Endoscopy;  Laterality: N/A;   ESOPHAGOGASTRODUODENOSCOPY (EGD) WITH PROPOFOL  N/A 10/16/2021   Procedure: ESOPHAGOGASTRODUODENOSCOPY (EGD) WITH PROPOFOL ;  Surgeon: Maryruth Ole DASEN, MD;  Location: [facility] ENDOSCOPY;  Service: Endoscopy;  Laterality: N/A;   EYE SURGERY Right    laser scraping   OPEN REDUCTION INTERNAL FIXATION (ORIF) DISTAL RADIAL FRACTURE Right 02/09/2018   Procedure: OPEN REDUCTION INTERNAL FIXATION (ORIF) DISTAL RADIAL FRACTURE;  Surgeon: Kathlynn Sharper, MD;  Location: [facility] ORS;  Service: Orthopedics;  Laterality: Right;   TONSILLECTOMY      Family History  Problem Relation Age of Onset   Hyperlipidemia Mother    Alzheimer's disease Mother    Emphysema Father    AAA (abdominal aortic aneurysm) Father    Kidney Stones Father    Kidney cancer Brother    Pancreatic cancer Paternal Uncle    Diabetes Paternal Uncle    Stomach cancer Neg Hx    Colon cancer Neg Hx    Esophageal cancer Neg Hx     Social History   Socioeconomic History   Marital status: Widowed    Spouse name: Not on file   Number of children: 2   Years of education: Not on file   Highest education level: Bachelor's degree (e.g., BA, AB, BS)  Occupational History   Occupation: retired  Tobacco Use   Smoking status: Never    Passive exposure: Past   Smokeless tobacco: Never   Tobacco comments:    Quit at age 44, smoked 1 pack a month   Vaping Use   Vaping status: Never Used  Substance and Sexual Activity   Alcohol use: Yes    Alcohol/week: 2.0 standard drinks of alcohol    Types: 2 Glasses of wine per week   Drug use: No   Sexual activity: Not Currently    Partners: Male  Other Topics Concern   Not on file  Social History Narrative   no drug use no alcohol   Social Drivers of Corporate Investment Banker Strain: Low Risk  (01/31/2024)   Overall Financial Resource Strain (CARDIA)    Difficulty of Paying Living Expenses: Not hard at all  Food Insecurity: No Food Insecurity (01/31/2024)    Hunger Vital Sign    Worried About Running Out of Food in the Last Year: Never true    Ran Out of Food in the Last Year: Never true  Transportation Needs: No Transportation Needs (01/31/2024)   PRAPARE - Administrator, Civil Service (Medical): No    Lack of Transportation (Non-Medical): No  Physical Activity: Inactive (01/31/2024)   Exercise Vital Sign    Days of Exercise per Week: 0 days    Minutes of Exercise per Session: Not on file  Stress: Stress Concern Present (01/31/2024)   [employer] of Occupational Health - Occupational Stress Questionnaire    Feeling of Stress: Rather much  Social Connections: Moderately Integrated (01/31/2024)   Social Connection and Isolation Panel    Frequency of Communication with Friends and Family: More than three times a  week    Frequency of Social Gatherings with Friends and Family: Twice a week    Attends Religious Services: More than 4 times per year    Active Member of [employer] or Organizations: Yes    Attends Banker Meetings: 1 to 4 times per year    Marital Status: Widowed  Intimate Partner Violence: Not At Risk (11/10/2023)   Humiliation, Afraid, Rape, and Kick questionnaire    Fear of Current or Ex-Partner: No    Emotionally Abused: No    Physically Abused: No    Sexually Abused: No     Outpatient Medications Prior to Visit  Medication Sig Dispense Refill   acetaminophen  (TYLENOL ) 500 MG tablet Take 1,000 mg by mouth every 6 (six) hours as needed.     albuterol  (PROVENTIL ) (2.5 MG/3ML) 0.083% nebulizer solution Take 3 mLs (2.5 mg total) by nebulization in the morning and at bedtime. 75 mL 11   albuterol  (VENTOLIN  HFA) 108 (90 Base) MCG/ACT inhaler Inhale 2 puffs into the lungs every 6 (six) hours as needed for shortness of breath. 3 each 0   ALPRAZolam  (XANAX ) 0.25 MG tablet Take 1 tablet (0.25 mg total) by mouth 2 (two) times daily as needed for anxiety. 45 tablet 5   amoxicillin -clavulanate (AUGMENTIN )  875-125 MG tablet Take 1 tablet by mouth 2 (two) times daily. 20 tablet 0   benzonatate  (TESSALON ) 100 MG capsule Take 1 capsule (100 mg total) by mouth 3 (three) times daily as needed for cough. 21 capsule 0   budesonide -formoterol  (SYMBICORT ) 160-4.5 MCG/ACT inhaler Inhale 2 puffs into the lungs in the morning and at bedtime. 1 each 12   clindamycin  (CLEOCIN  T) 1 % lotion Apply topically.     cycloSPORINE  (RESTASIS ) 0.05 % ophthalmic emulsion Place 1 drop into both eyes 2 (two) times daily.     fluticasone  (FLONASE ) 50 MCG/ACT nasal spray Place 1 spray into both nostrils in the morning and at bedtime. 100 mL 2   ipratropium (ATROVENT ) 0.03 % nasal spray Place 2 sprays into both nostrils every 12 (twelve) hours.     mometasone (ELOCON) 0.1 % lotion      nystatin  (MYCOSTATIN ) 100000 UNIT/ML suspension Take 5 mLs (500,000 Units total) by mouth 4 (four) times daily. 60 mL 0   pantoprazole  (PROTONIX ) 40 MG tablet TAKE 1 TABLET(40 MG) BY MOUTH DAILY 90 tablet 3   rosuvastatin  (CRESTOR ) 10 MG tablet TAKE 1 TABLET(10 MG) BY MOUTH DAILY 90 tablet 1   sodium chloride  HYPERTONIC 3 % nebulizer solution Take by nebulization in the morning and at bedtime. 750 mL 12   Spacer/Aero-Holding Chambers (AEROCHAMBER MV) inhaler Use as instructed 1 each 0   sucralfate  (CARAFATE ) 1 g tablet Take 1 tablet (1 g total) by mouth 2 (two) times daily. 180 tablet 1   No facility-administered medications prior to visit.    Allergies  Allergen Reactions   Percocet [Oxycodone -Acetaminophen ] Nausea Only   Ceftin [Cefuroxime Axetil] Hives and Rash   Timentin [Ticarcillin-Pot Clavulanate] Hives and Rash    ROS Review of Systems Negative unless indicated in HPI.    Objective:    Physical Exam  There were no vitals taken for this visit. Wt Readings from Last 3 Encounters:  03/01/24 142 lb 9.6 oz (64.7 kg)  02/27/24 138 lb 12.8 oz (63 kg)  02/05/24 142 lb 4 oz (64.5 kg)     Health Maintenance  Topic Date Due    Medicare Annual Wellness (AWV)  11/09/2024   Mammogram  02/08/2025   DTaP/Tdap/Td (3 - Td or Tdap) 10/06/2032   Pneumococcal Vaccine: 50+ Years  Completed   Influenza Vaccine  Completed   Bone Density Scan  Completed   Zoster Vaccines- Shingrix  Completed   Meningococcal B Vaccine  Aged Out   Colonoscopy  Discontinued   COVID-19 Vaccine  Discontinued    There are no preventive care reminders to display for this patient.  Lab Results  Component Value Date   TSH 0.68 12/30/2023   Lab Results  Component Value Date   WBC 5.3 01/15/2024   HGB 12.9 01/15/2024   HCT 38.8 01/15/2024   MCV 88.8 01/15/2024   PLT 181 01/15/2024   Lab Results  Component Value Date   NA 139 01/07/2024   K 4.2 01/07/2024   CO2 31 01/07/2024   GLUCOSE 92 01/07/2024   BUN 12 01/07/2024   CREATININE 0.88 01/07/2024   BILITOT 0.7 12/30/2023   ALKPHOS 81 12/30/2023   AST 23 12/30/2023   ALT 12 12/30/2023   PROT 7.2 12/30/2023   ALBUMIN 4.6 12/30/2023   CALCIUM  9.4 01/07/2024   ANIONGAP 8 01/07/2024   GFR 54.23 (L) 12/30/2023   Lab Results  Component Value Date   CHOL 185 12/30/2023   Lab Results  Component Value Date   HDL 78.20 12/30/2023   Lab Results  Component Value Date   LDLCALC 93 12/30/2023   Lab Results  Component Value Date   TRIG 71.0 12/30/2023   Lab Results  Component Value Date   CHOLHDL 2 12/30/2023   Lab Results  Component Value Date   HGBA1C 6.2 12/30/2023      Assessment & Plan:   Assessment & Plan URI with cough and congestion     Neck pain      Assessment and Plan Assessment & Plan       Follow-up: No follow-ups on file.   Jaylina Ramdass, NP

## 2024-03-13 ENCOUNTER — Other Ambulatory Visit: Payer: Self-pay

## 2024-03-13 ENCOUNTER — Emergency Department

## 2024-03-13 ENCOUNTER — Observation Stay

## 2024-03-13 ENCOUNTER — Observation Stay
Admission: EM | Admit: 2024-03-13 | Discharge: 2024-03-13 | Disposition: A | Discharge status: Home / Self Care | Attending: Internal Medicine | Admitting: Internal Medicine

## 2024-03-13 DIAGNOSIS — R29818 Other symptoms and signs involving the nervous system: Secondary | ICD-10-CM | POA: Diagnosis not present

## 2024-03-13 DIAGNOSIS — I2510 Atherosclerotic heart disease of native coronary artery without angina pectoris: Secondary | ICD-10-CM

## 2024-03-13 DIAGNOSIS — A310 Pulmonary mycobacterial infection: Secondary | ICD-10-CM

## 2024-03-13 DIAGNOSIS — Z1152 Encounter for screening for COVID-19: Secondary | ICD-10-CM | POA: Insufficient documentation

## 2024-03-13 DIAGNOSIS — I10 Essential (primary) hypertension: Secondary | ICD-10-CM | POA: Diagnosis not present

## 2024-03-13 DIAGNOSIS — M5481 Occipital neuralgia: Secondary | ICD-10-CM | POA: Insufficient documentation

## 2024-03-13 DIAGNOSIS — R911 Solitary pulmonary nodule: Secondary | ICD-10-CM | POA: Diagnosis not present

## 2024-03-13 DIAGNOSIS — R059 Cough, unspecified: Secondary | ICD-10-CM | POA: Diagnosis not present

## 2024-03-13 DIAGNOSIS — Z7901 Long term (current) use of anticoagulants: Secondary | ICD-10-CM | POA: Insufficient documentation

## 2024-03-13 DIAGNOSIS — R052 Subacute cough: Secondary | ICD-10-CM | POA: Diagnosis not present

## 2024-03-13 DIAGNOSIS — R202 Paresthesia of skin: Secondary | ICD-10-CM | POA: Diagnosis not present

## 2024-03-13 DIAGNOSIS — A318 Other mycobacterial infections: Secondary | ICD-10-CM | POA: Insufficient documentation

## 2024-03-13 DIAGNOSIS — R200 Anesthesia of skin: Secondary | ICD-10-CM | POA: Diagnosis not present

## 2024-03-13 DIAGNOSIS — R001 Bradycardia, unspecified: Secondary | ICD-10-CM | POA: Insufficient documentation

## 2024-03-13 DIAGNOSIS — I5032 Chronic diastolic (congestive) heart failure: Secondary | ICD-10-CM | POA: Insufficient documentation

## 2024-03-13 DIAGNOSIS — Z79899 Other long term (current) drug therapy: Secondary | ICD-10-CM | POA: Insufficient documentation

## 2024-03-13 DIAGNOSIS — Z8674 Personal history of sudden cardiac arrest: Secondary | ICD-10-CM | POA: Insufficient documentation

## 2024-03-13 DIAGNOSIS — R053 Chronic cough: Secondary | ICD-10-CM | POA: Insufficient documentation

## 2024-03-13 DIAGNOSIS — J4540 Moderate persistent asthma, uncomplicated: Secondary | ICD-10-CM | POA: Insufficient documentation

## 2024-03-13 LAB — COMPREHENSIVE METABOLIC PANEL WITH GFR
ALT: 16 U/L (ref 0–44)
AST: 30 U/L (ref 15–41)
Albumin: 4.5 g/dL (ref 3.5–5.0)
Alkaline Phosphatase: 103 U/L (ref 38–126)
Anion gap: 11 (ref 5–15)
BUN: 14 mg/dL (ref 8–23)
CO2: 25 mmol/L (ref 22–32)
Calcium: 9.7 mg/dL (ref 8.9–10.3)
Chloride: 103 mmol/L (ref 98–111)
Creatinine, Ser: 0.75 mg/dL (ref 0.44–1.00)
GFR, Estimated: >60 mL/min (ref >60)
Glucose, Bld: 110 mg/dL — ABNORMAL HIGH (ref 70–99)
Potassium: 3.8 mmol/L (ref 3.5–5.1)
Sodium: 139 mmol/L (ref 135–145)
Total Bilirubin: 0.5 mg/dL (ref 0.0–1.2)
Total Protein: 7.4 g/dL (ref 6.5–8.1)

## 2024-03-13 LAB — CBC
HCT: 35.4 % — ABNORMAL LOW (ref 36.0–46.0)
Hemoglobin: 12 g/dL (ref 12.0–15.0)
MCH: 30.2 pg (ref 26.0–34.0)
MCHC: 33.9 g/dL (ref 30.0–36.0)
MCV: 88.9 fL (ref 80.0–100.0)
Platelets: 183 K/uL (ref 150–400)
RBC: 3.98 MIL/uL (ref 3.87–5.11)
RDW: 13.4 % (ref 11.5–15.5)
WBC: 5.9 K/uL (ref 4.0–10.5)
nRBC: 0 % (ref 0.0–0.2)

## 2024-03-13 LAB — RESP PANEL BY RT-PCR (RSV, FLU A&B, COVID)  RVPGX2
Influenza A by PCR: NEGATIVE
Influenza B by PCR: NEGATIVE
Resp Syncytial Virus by PCR: NEGATIVE
SARS Coronavirus 2 by RT PCR: NEGATIVE

## 2024-03-13 LAB — APTT: aPTT: 30 s (ref 24–36)

## 2024-03-13 LAB — DIFFERENTIAL
Abs Immature Granulocytes: 0.02 K/uL (ref 0.00–0.07)
Basophils Absolute: 0 K/uL (ref 0.0–0.1)
Basophils Relative: 1 %
Eosinophils Absolute: 0.2 K/uL (ref 0.0–0.5)
Eosinophils Relative: 4 %
Immature Granulocytes: 0 %
Lymphocytes Relative: 23 %
Lymphs Abs: 1.4 K/uL (ref 0.7–4.0)
Monocytes Absolute: 0.7 K/uL (ref 0.1–1.0)
Monocytes Relative: 12 %
Neutro Abs: 3.5 K/uL (ref 1.7–7.7)
Neutrophils Relative %: 60 %

## 2024-03-13 LAB — LIPID PANEL
Cholesterol: 165 mg/dL (ref 0–200)
HDL: 57 mg/dL (ref >40)
LDL Cholesterol: 95 mg/dL (ref 0–99)
Total CHOL/HDL Ratio: 2.9 ratio
Triglycerides: 63 mg/dL (ref <150)
VLDL: 13 mg/dL (ref 0–40)

## 2024-03-13 LAB — CBG MONITORING, ED: Glucose-Capillary: 100 mg/dL — ABNORMAL HIGH (ref 70–99)

## 2024-03-13 LAB — PROTIME-INR
INR: 0.9 (ref 0.8–1.2)
Prothrombin Time: 13.1 s (ref 11.4–15.2)

## 2024-03-13 MED ORDER — DOXYCYCLINE HYCLATE 100 MG PO TABS: 100.0000 mg | ORAL_TABLET | Freq: Two times a day (BID) | ORAL | Status: DC

## 2024-03-13 MED ORDER — DIPHENHYDRAMINE HCL 50 MG/ML IJ SOLN: 25.0000 mg | Freq: Once | INTRAMUSCULAR | Status: DC

## 2024-03-13 MED ORDER — GUAIFENESIN ER 600 MG PO TB12: 600.0000 mg | ORAL_TABLET | Freq: Two times a day (BID) | ORAL | Status: DC | PRN

## 2024-03-13 MED ORDER — ROSUVASTATIN CALCIUM 10 MG PO TABS
10.0000 mg | ORAL_TABLET | Freq: Every day | ORAL | Status: DC
  Administered 2024-03-13: 10 mg via ORAL
  Filled 2024-03-13: qty 1

## 2024-03-13 MED ORDER — ACETAMINOPHEN 500 MG PO TABS
1000.0000 mg | ORAL_TABLET | Freq: Once | ORAL | Status: AC
  Administered 2024-03-13: 1000 mg via ORAL
  Filled 2024-03-13: qty 2

## 2024-03-13 MED ORDER — ONDANSETRON HCL 4 MG/2ML IJ SOLN
4.0000 mg | Freq: Once | INTRAMUSCULAR | Status: AC
  Administered 2024-03-13: 4 mg via INTRAVENOUS
  Filled 2024-03-13: qty 2

## 2024-03-13 MED ORDER — STROKE: EARLY STAGES OF RECOVERY BOOK: Freq: Once | Status: DC

## 2024-03-13 MED ORDER — FLUTICASONE FUROATE-VILANTEROL 100-25 MCG/ACT IN AEPB
1.0000 | INHALATION_SPRAY | Freq: Every day | RESPIRATORY_TRACT | Status: DC
  Filled 2024-03-13: qty 28

## 2024-03-13 MED ORDER — ACETAMINOPHEN 160 MG/5ML PO SOLN: 650.0000 mg | ORAL | Status: DC | PRN

## 2024-03-13 MED ORDER — PANTOPRAZOLE SODIUM 40 MG PO TBEC
40.0000 mg | DELAYED_RELEASE_TABLET | Freq: Every day | ORAL | Status: DC
  Administered 2024-03-13: 40 mg via ORAL
  Filled 2024-03-13: qty 1

## 2024-03-13 MED ORDER — SODIUM CHLORIDE 0.9 % IV SOLN: INTRAVENOUS | Status: DC

## 2024-03-13 MED ORDER — ENOXAPARIN SODIUM 40 MG/0.4ML IJ SOSY
40.0000 mg | PREFILLED_SYRINGE | INTRAMUSCULAR | Status: DC
  Filled 2024-03-13: qty 0.4

## 2024-03-13 MED ORDER — ASPIRIN 81 MG PO TBEC
81.0000 mg | DELAYED_RELEASE_TABLET | Freq: Every day | ORAL | Status: DC
  Administered 2024-03-13: 81 mg via ORAL
  Filled 2024-03-13: qty 1

## 2024-03-13 MED ORDER — ALPRAZOLAM 0.25 MG PO TABS: 0.2500 mg | ORAL_TABLET | Freq: Two times a day (BID) | ORAL | Status: DC | PRN

## 2024-03-13 MED ORDER — KETOROLAC TROMETHAMINE 15 MG/ML IJ SOLN
15.0000 mg | Freq: Once | INTRAMUSCULAR | Status: AC
  Administered 2024-03-13: 15 mg via INTRAVENOUS
  Filled 2024-03-13: qty 1

## 2024-03-13 MED ORDER — ACETAMINOPHEN 650 MG RE SUPP: 650.0000 mg | RECTAL | Status: DC | PRN

## 2024-03-13 MED ORDER — SODIUM CHLORIDE 3 % IN NEBU: 4.0000 mL | INHALATION_SOLUTION | Freq: Two times a day (BID) | RESPIRATORY_TRACT | Status: DC

## 2024-03-13 MED ORDER — CYCLOSPORINE 0.05 % OP EMUL
1.0000 [drp] | Freq: Two times a day (BID) | OPHTHALMIC | Status: DC
  Administered 2024-03-13: 1 [drp] via OPHTHALMIC
  Filled 2024-03-13: qty 30

## 2024-03-13 MED ORDER — ACETAMINOPHEN 325 MG PO TABS
650.0000 mg | ORAL_TABLET | ORAL | Status: DC | PRN
  Administered 2024-03-13: 650 mg via ORAL
  Filled 2024-03-13: qty 2

## 2024-03-13 MED ORDER — DOXYCYCLINE HYCLATE 100 MG PO TABS
100.0000 mg | ORAL_TABLET | Freq: Two times a day (BID) | ORAL | Status: DC
  Administered 2024-03-13: 100 mg via ORAL
  Filled 2024-03-13: qty 1

## 2024-03-13 NOTE — Assessment & Plan Note (Signed)
 No complaints of chest pain Continue home GDMT Followed by cardiology last seen 02/05/2024

## 2024-03-13 NOTE — H&P (Signed)
 History and Physical    Patient: Alison Bradshaw FMW:[id number] DOB: [birth year] DOA: 03/13/2024 DOS: the patient was seen and examined on 03/13/2024 PCP: Marylynn Verneita CROME, MD  Patient coming from: Home  Chief Complaint: numbness left face  HPI: Alison Bradshaw is a 83 y.o. 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   She denies visual disturbance, facial droop, slurred speech, weakness in the extremities.  She describes the numbness as radiating from the left preauricular area down to the jaw.  Also notes new headache (bifrontal and occipital) x 2 weeks, with upper neck pain that is intermittent and severe.  Her symptoms were preceded by 5 weeks of a congested cough with sinus congestion occasional right ear pain.  She has had 3 rounds of antibiotics, most recently doxycycline  started the day prior.  States she has never had headaches until 2 weeks ago and neck pain is new as well.  She does have nausea without vomiting.  Has seen ENT in the past a long time ago.  In the ED, BP slightly elevated on arrival at 114/100, otherwise normal vitals.  Blood work including CBC CMP and respiratory viral panel unremarkable.  EKG showed sinus bradycardia at 58.  Head CT nonacute. Admission for stroke workup requested.     Past Medical History:  Diagnosis Date   Anxiety    Asthma    Atypical chest pain    Barrett's esophagus    Chronic gastritis    Diastolic dysfunction    a. 08/2020 Echo: EF 60-65%, no rwma, Nl RV fxn, mild MR; b. 01/2024 Echo: EF 55-60%, no rwma, GrI DD, nl RV fxn, mild MR, mild-mod TR.   Diverticulosis    Duodenal ulcer    Gastritis    GERD (gastroesophageal reflux  disease)    Hiatal hernia    Hyperlipidemia    Mycobacterium avium complex (HCC)    Non-obstructive CAD (coronary artery disease)    a. 10/2017 Stress Echo: Nl EF, no ischemia. Mild MR; b. 12/2017 Cor CTA: LM nl, LAD 50p, 30d, D1/2 nl, RI nl, LCX nl, OM1/2 nl, RCA nl, RPDA/RPL nl. Ca2+ score = 39 (46th percentile)-->statin dose escalated; c. 10/2021 MV: No ischemia/infarct. Nl LV fxn; d. 01/2024 Cor CTA: Ca2+ = 62.8 (38th%'ile). LM nl, LAD <25, LCX nl, RCA nl.   Osteopenia    Peptic ulcer, site unspecified, unspecified as acute or chronic, without hemorrhage or perforation 11/19/2012   Formatting of this note might be different from the original.  duodenum 1966  Formatting of this note might be different from the original.  Overview:   duodenum 1966   PSVT (paroxysmal supraventricular tachycardia)    a. 10/2017 Holter: PSVT, max 5 beats->did not tolerate beta blocker; b. 09/2020 Zio: 38 runs of SVT. Longest 18.1 secs (106), fastest 203 bpm (6 beats). Occas PVCs (1.1%) - some assoc w/ triggered events; C. 11/2022 Zio: Predominantly sinus rhythm @ 69 (48-182).  34 runs of PSVT, longest 12 beats.  3.3% PVC burden.  No sustained arrhythmias or A-fib.   Pulmonary nodules    a. 12/2017 Cardiac CTA: RLL 9mm nodule; b. 04/2018 RLL 9mm nodule resolved; c. 09/2018 CT Chest: Previously noted posterior RUL nodules almost completely resolved. Bandlike scarring of RML and lingula-->atyp infxn;  d. 11/2022 CT Chest: Clustered bilateral solid pulmonary nodules and groundglass opacities, most pronounced in posterior RUL, RML, and lingula - ? chronic atyp infxn. New 6mm RLL nodule.   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   Past Surgical History:  Procedure Laterality Date    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   BREAST BIOPSY Left 2008   neg   BREAST EXCISIONAL BIOPSY Left 2004   neg   CATARACT EXTRACTION W/ INTRAOCULAR LENS  IMPLANT, BILATERAL Bilateral    COLONOSCOPY WITH PROPOFOL  N/A 01/22/2017   Procedure: COLONOSCOPY WITH PROPOFOL ;  Surgeon: Toledo, Ladell POUR, MD;  Location: [facility] ENDOSCOPY;  Service: Gastroenterology;  Laterality: N/A;   COLONOSCOPY WITH PROPOFOL  N/A 03/26/2022   Procedure: COLONOSCOPY WITH PROPOFOL ;  Surgeon: Maryruth Ole DASEN, MD;  Location: [facility] ENDOSCOPY;  Service: Endoscopy;  Laterality: N/A;   ESOPHAGOGASTRODUODENOSCOPY (EGD) WITH PROPOFOL  N/A 01/20/2015   Procedure: ESOPHAGOGASTRODUODENOSCOPY (EGD) WITH PROPOFOL ;  Surgeon: Gladis RAYMOND Mariner, MD;  Location: [facility] ENDOSCOPY;  Service: Endoscopy;  Laterality: N/A;   ESOPHAGOGASTRODUODENOSCOPY (EGD) WITH PROPOFOL  N/A 01/22/2017   Procedure: ESOPHAGOGASTRODUODENOSCOPY (EGD) WITH PROPOFOL ;  Surgeon: Toledo, Ladell POUR, MD;  Location: [facility] ENDOSCOPY;  Service: Gastroenterology;  Laterality: N/A;   ESOPHAGOGASTRODUODENOSCOPY (EGD) WITH PROPOFOL  N/A 04/14/2018   Procedure: ESOPHAGOGASTRODUODENOSCOPY (EGD) WITH PROPOFOL ;  Surgeon: Mariner Gladis RAYMOND, MD;  Location: [facility] ENDOSCOPY;  Service: Endoscopy;  Laterality: N/A;   ESOPHAGOGASTRODUODENOSCOPY (EGD) WITH PROPOFOL  N/A 10/16/2021   Procedure: ESOPHAGOGASTRODUODENOSCOPY (EGD) WITH PROPOFOL ;  Surgeon: Maryruth Ole DASEN, MD;  Location: [facility] ENDOSCOPY;  Service: Endoscopy;  Laterality: N/A;   EYE SURGERY Right    laser scraping   OPEN REDUCTION INTERNAL FIXATION (ORIF) DISTAL RADIAL FRACTURE Right 02/09/2018   Procedure: OPEN REDUCTION INTERNAL FIXATION (ORIF) DISTAL RADIAL FRACTURE;  Surgeon: Kathlynn Sharper, MD;  Location: [facility] ORS;  Service: Orthopedics;  Laterality: Right;   TONSILLECTOMY     Social History:  reports that she has never smoked. She has been exposed to tobacco smoke. She has never used smokeless tobacco. She reports current  alcohol use of about 2.0 standard drinks of alcohol per week. She reports that she does not use drugs.  Allergies  Allergen Reactions   Percocet [Oxycodone -Acetaminophen ] Nausea Only   Ceftin [Cefuroxime Axetil] Hives and Rash   Timentin [Ticarcillin-Pot Clavulanate] Hives and Rash    Family History  Problem Relation Age of Onset   Hyperlipidemia Mother    Alzheimer's disease Mother    Emphysema Father    AAA (abdominal aortic aneurysm) Father    Kidney Stones Father    Kidney cancer Brother    Pancreatic cancer Paternal Uncle    Diabetes Paternal Uncle    Stomach cancer Neg Hx    Colon cancer Neg Hx    Esophageal cancer Neg Hx     Prior to Admission medications   Medication Sig Start Date End Date Taking? Authorizing Provider  acetaminophen  (TYLENOL ) 500 MG tablet Take 1,000 mg by mouth every 6 (six) hours as needed.   Yes [provider]  albuterol  (PROVENTIL ) (2.5 MG/3ML) 0.083% nebulizer solution Take 3 mLs (2.5 mg total) by nebulization in the morning and at bedtime. 06/19/23 06/18/24 Yes Dgayli, Belva, MD  albuterol  (VENTOLIN  HFA) 108 (90 Base) MCG/ACT inhaler  Inhale 2 puffs into the lungs every 6 (six) hours as needed for shortness of breath. 01/30/24  Yes Marylynn Verneita CROME, MD  ALPRAZolam  (XANAX ) 0.25 MG tablet Take 1 tablet (0.25 mg total) by mouth 2 (two) times daily as needed for anxiety. 12/30/23  Yes Marylynn Verneita CROME, MD  budesonide -formoterol  (SYMBICORT ) 160-4.5 MCG/ACT inhaler Inhale 2 puffs into the lungs in the morning and at bedtime. 02/25/23  Yes Assaker, Darrin, MD  cycloSPORINE  (RESTASIS ) 0.05 % ophthalmic emulsion Place 1 drop into both eyes 2 (two) times daily. 11/04/23  Yes [provider]  doxycycline  (VIBRA -TABS) 100 MG tablet Take 1 tablet (100 mg total) by mouth 2 (two) times daily for 10 days. 03/12/24 03/22/24 Yes Kaur, Charanpreet, NP  fluticasone  (FLONASE ) 50 MCG/ACT nasal spray Place 1 spray into both nostrils in the morning and at  bedtime. 02/25/23  Yes Assaker, Darrin, MD  guaiFENesin  (MUCINEX ) 600 MG 12 hr tablet Take 600 mg by mouth 2 (two) times daily as needed for to loosen phlegm or cough.   Yes [provider]  pantoprazole  (PROTONIX ) 40 MG tablet TAKE 1 TABLET(40 MG) BY MOUTH DAILY 08/25/23  Yes Tullo, Teresa L, MD  psyllium (HYDROCIL/METAMUCIL) 95 % PACK Take 1 packet by mouth every other day.   Yes [provider]  rosuvastatin  (CRESTOR ) 10 MG tablet TAKE 1 TABLET(10 MG) BY MOUTH DAILY 12/30/23  Yes Tullo, Teresa L, MD  sodium chloride  HYPERTONIC 3 % nebulizer solution Take by nebulization in the morning and at bedtime. 06/19/23  Yes Dgayli, Belva, MD  amoxicillin -clavulanate (AUGMENTIN ) 875-125 MG tablet Take 1 tablet by mouth 2 (two) times daily. Patient not taking: Reported on 03/12/2024 02/27/24   Kaur, Charanpreet, NP  benzonatate  (TESSALON ) 100 MG capsule Take 1 capsule (100 mg total) by mouth 3 (three) times daily as needed for cough. Patient not taking: Reported on 03/13/2024 02/14/23   Wendee Lynwood HERO, NP  clindamycin  (CLEOCIN  T) 1 % lotion Apply topically. Patient not taking: Reported on 03/13/2024 12/29/23   [provider]  fluconazole  (DIFLUCAN ) 150 MG tablet Take 150 mg by mouth once. Patient not taking: Reported on 03/13/2024 03/12/24   [provider]  mometasone (ELOCON) 0.1 % lotion  11/27/23   [provider]  nystatin  (MYCOSTATIN ) 100000 UNIT/ML suspension Take 5 mLs (500,000 Units total) by mouth 4 (four) times daily. Patient not taking: Reported on 03/12/2024 03/01/24   Marylynn Verneita CROME, MD  Spacer/Aero-Holding Chambers (AEROCHAMBER MV) inhaler Use as instructed 05/05/23   Malka Darrin, MD  sucralfate  (CARAFATE ) 1 g tablet Take 1 tablet (1 g total) by mouth 2 (two) times daily. Patient not taking: Reported on 03/13/2024 02/11/24   Marylynn Verneita CROME, MD    Physical Exam: Vitals:   03/13/24 0135 03/13/24 0202 03/13/24 0230 03/13/24 0300  BP:  130/62  137/68 (!) 119/54  Pulse:  64 64 69  Resp:  17 19 (!) 26  Temp:      SpO2: 100% 99% 99% 99%  Weight:      Height:       Physical Exam Vitals and nursing note reviewed.  Constitutional:      General: She is not in acute distress. HENT:     Head: Normocephalic and atraumatic.  Cardiovascular:     Rate and Rhythm: Normal rate and regular rhythm.     Heart sounds: Normal heart sounds.  Pulmonary:     Effort: Pulmonary effort is normal.     Breath sounds: Normal breath sounds.  Abdominal:     Palpations: Abdomen is soft.     Tenderness: There is no abdominal tenderness.  Neurological:     Mental Status: Mental status is at baseline.     Labs on Admission: I have personally reviewed following labs and imaging studies  CBC: Recent Labs  Lab 03/13/24 0054  WBC 5.9  NEUTROABS 3.5  HGB 12.0  HCT 35.4*  MCV 88.9  PLT 183   Basic Metabolic Panel: Recent Labs  Lab 03/13/24 0054  NA 139  K 3.8  CL 103  CO2 25  GLUCOSE 110*  BUN 14  CREATININE 0.75  CALCIUM  9.7   GFR: Estimated Creatinine Clearance: 49.5 mL/min (by C-G formula based on SCr of 0.75 mg/dL). Liver Function Tests: Recent Labs  Lab 03/13/24 0054  AST 30  ALT 16  ALKPHOS 103  BILITOT 0.5  PROT 7.4  ALBUMIN 4.5   No results for input(s): LIPASE, AMYLASE in the last 168 hours. No results for input(s): AMMONIA in the last 168 hours. Coagulation Profile: Recent Labs  Lab 03/13/24 0054  INR 0.9   Cardiac Enzymes: No results for input(s): CKTOTAL, CKMB, CKMBINDEX, TROPONINI in the last 168 hours. BNP (last 3 results) No results for input(s): PROBNP in the last 8760 hours. HbA1C: No results for input(s): HGBA1C in the last 72 hours. CBG: Recent Labs  Lab 03/13/24 0112  GLUCAP 100*   Lipid Profile: No results for input(s): CHOL, HDL, LDLCALC, TRIG, CHOLHDL, LDLDIRECT in the last 72 hours. Thyroid  Function Tests: No results for input(s): TSH, T4TOTAL,  FREET4, T3FREE, THYROIDAB in the last 72 hours. Anemia Panel: No results for input(s): VITAMINB12, FOLATE, FERRITIN, TIBC, IRON, RETICCTPCT in the last 72 hours. Urine analysis:    Component Value Date/Time   COLORURINE YELLOW 03/01/2024 1214   APPEARANCEUR CLEAR 03/01/2024 1214   APPEARANCEUR Clear 05/22/2022 1343   LABSPEC <=1.005 (A) 03/01/2024 1214   PHURINE 6.5 03/01/2024 1214   GLUCOSEU NEGATIVE 03/01/2024 1214   HGBUR NEGATIVE 03/01/2024 1214   BILIRUBINUR NEGATIVE 03/01/2024 1214   BILIRUBINUR Negative 05/22/2022 1343   KETONESUR NEGATIVE 03/01/2024 1214   PROTEINUR Negative 05/22/2022 1343   PROTEINUR NEGATIVE 04/07/2022 1456   UROBILINOGEN 0.2 03/01/2024 1214   NITRITE NEGATIVE 03/01/2024 1214   LEUKOCYTESUR NEGATIVE 03/01/2024 1214    Radiological Exams on Admission: DG Chest Portable 1 View Result Date: 03/13/2024 EXAM: 1 VIEW(S) XRAY OF THE CHEST 03/13/2024 01:20:17 AM COMPARISON: 01/07/2024 CLINICAL HISTORY: cough hx mac FINDINGS: LUNGS AND PLEURA: Right mid lung 7 mm nodular density, corresponding to a subpleural nodular density within the right middle lobe seen on prior CT examinations of 02/01/2022 and 05/15/2022. No confluent pulmonary infiltrate. No pleural effusion. No pneumothorax. HEART AND MEDIASTINUM: No acute abnormality of the cardiac and mediastinal silhouettes. BONES AND SOFT TISSUES: No acute osseous abnormality. IMPRESSION: 1. No acute cardiopulmonary process. 2. Stable 7 mm nodular density in the right mid lung zone, unchanged from prior CTs. Electronically signed by: Dorethia Molt MD 03/13/2024 01:30 AM EST RP Workstation: [id number]   CT HEAD WO CONTRAST Result Date: 03/13/2024 EXAM: CT HEAD WITHOUT CONTRAST 03/13/2024 01:06:41 AM TECHNIQUE: CT of the head was performed without the administration of intravenous contrast. Automated exposure control, iterative reconstruction, and/or weight based adjustment of the mA/kV was utilized to  reduce the radiation dose to as low as reasonably achievable. COMPARISON: CT head Aug 25, 2023 CLINICAL HISTORY: Neuro deficit, acute, stroke suspected FINDINGS: BRAIN AND VENTRICLES: No acute hemorrhage. No evidence of acute infarct.  No hydrocephalus. No extra-axial collection. No mass effect or midline shift. ORBITS: No acute abnormality. SINUSES: No acute abnormality. SOFT TISSUES AND SKULL: No acute soft tissue abnormality. No skull fracture. IMPRESSION: 1. No acute intracranial abnormality. Electronically signed by: Gilmore Molt MD 03/13/2024 01:12 AM EST RP Workstation: [id number]   Data Reviewed for HPI: Relevant notes from primary care and specialist visits, past discharge summaries as available in EHR, including Care Everywhere. 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 worsened by ongoing coughing-low suspicion for Bell's palsy -Will first rule out stroke -Will get routine stroke workup as follows: -Permissive hypertension for first 24-48 hrs post stroke onset: Prn Labetalol IV or Vasotec IV If BP greater than 220/120  -Statins for LDL goal less than 70 -Daily aspirin   -Telemetry, echo, MRI -Avoid dextrose containing fluids, Maintain euglycemia, euthermia -Neuro checks q4 hrs x 24 hrs and then per shift -Head of bed 30 degrees -Physical therapy/Occupational therapy/Speech therapy if failed dysphagia screen -Can consider neurology consult -Will get CT neck to evaluate for  cervical spine disease   Mycobacterium avium complex (HCC) Moderate persistent asthma Continue home doxycycline  started on 03/12/2024 for recent worsening cough Continue home inhalers DuoNebs as needed Antitussives Flutter valve and incentive spirometer Continue outpatient follow-up with pulmonology  Chronic heart failure with preserved ejection fraction (HFpEF) (HCC) Clinically euvolemic Continue home GDMT  Coronary artery disease, nonobstructive No complaints of chest pain Continue home GDMT Followed by cardiology last seen 02/05/2024  Sinus bradycardia History of PVCs/SVT low burden on ZIO 02/27/2024 Bradycardia at baseline    DVT prophylaxis: Lovenox   Consults: none  Advance Care Planning:   Code Status: Prior   Family Communication: none  Disposition Plan: Back to previous home environment  Severity of Illness: The appropriate patient status for this patient is OBSERVATION. Observation status is judged to be reasonable and necessary in order to provide the required intensity of service to ensure the patient's safety. The patient's presenting symptoms, physical exam findings, and initial radiographic and laboratory data in the context of their medical condition is felt to place them at decreased risk for further clinical deterioration. Furthermore, it is anticipated that the patient will be medically stable for discharge from the hospital within 2 midnights of admission.   Author: Delayne LULLA Solian, MD 03/13/2024 3:36 AM  For on call review [email or web address].

## 2024-03-13 NOTE — Evaluation (Signed)
 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 PTA pt is indep in ADL/IADL, lives at [facility]. Pt is performing ADL/functional mobility close to her baseline. She amb 200' with MOD I, performs toilet transfer, toileting with MOD I. She does endorse feeling swimmy after amb attempts. Orthostatics recorded below. No further needs identified at this time. OT will sign off. Please re consult if there has been a chage in functional status.   Vitals recorded as follows: Seated 129/67 (84) HR 67 bpm Standing: 144/57 (82) HR72 bpm  Standing3 minutes: 118/62 (78) HR 67 bpm no dizziness reported but reported dizziness prior to this during amb 200'    If plan is discharge home, recommend the following:         Functional Status Assessment   Patient has not had a recent decline in their functional status     Equipment Recommendations   None recommended by OT     Recommendations for Other Services         Precautions/Restrictions   Precautions Precautions: None     Mobility Bed Mobility Overal bed mobility: Independent                  Transfers Overall transfer level: Modified independent                        Balance Overall balance assessment: Needs assistance   Sitting balance-Leahy Scale: Normal     Standing balance support: No upper extremity supported Standing balance-Leahy  Scale: Good                             ADL either performed or assessed with clinical judgement   ADL Overall ADL's : At baseline;Independent                                             Vision Patient Visual Report: No change from baseline Vision Assessment?: Yes Eye Alignment: Within Functional Limits Additional Comments: pt endorses feeling swimmy headed with walking     Perception         Praxis         Pertinent Vitals/Pain Pain Assessment Pain Assessment: No/denies pain     Extremity/Trunk Assessment Upper Extremity Assessment Upper Extremity Assessment: Overall WFL for tasks assessed   Lower Extremity Assessment Lower Extremity Assessment: Overall WFL for tasks assessed       Communication Communication Communication: No apparent difficulties   Cognition Arousal: Alert Behavior During Therapy: WFL for tasks assessed/performed Cognition: No apparent impairments                               Following commands: Intact       Cueing  General  Comments   Cueing Techniques: Verbal cues  pt endorses feeling different after amb 200'   Exercises Other Exercises Other Exercises: edu pt/fiance re role of OT, role of rehab, discharge recommendations   Shoulder Instructions      Home Living Family/patient expects to be discharged to:: Private residence Living Arrangements: Alone                           Home Equipment: Grab bars - toilet;Grab bars - tub/shower   Additional Comments: Pt lives at [facility]      Prior Functioning/Environment Prior Level of Function : Independent/Modified Independent                    OT Problem List:     OT Treatment/Interventions:        OT Goals(Current goals can be found in the care plan section)   Acute Rehab OT Goals OT Goal Formulation: All assessment and education complete, DC therapy   OT Frequency:        Co-evaluation              AM-PAC OT 6 Clicks Daily Activity     Outcome Measure Help from another person eating meals?: None Help from another person taking care of personal grooming?: None Help from another person toileting, which includes using toliet, bedpan, or urinal?: None Help from another person bathing (including washing, rinsing, drying)?: None Help from another person to put on and taking off regular upper body clothing?: None Help from another person to put on and taking off regular lower body clothing?: None 6 Click Score: 24   End of Session Nurse Communication: Mobility status (vitals)  Activity Tolerance: Patient tolerated treatment well Patient left: in bed;with call bell/phone within reach                   Time: 1028-1050 OT Time Calculation (min): 22 min Charges:  OT General Charges $OT Visit: 1 Visit OT Evaluation $OT Eval Low Complexity: 1 Low  Therisa Sheffield, OTD OTR/L  03/13/24, 11:58 AM

## 2024-03-13 NOTE — Assessment & Plan Note (Addendum)
 Frontal occipital headache and neck pain, new Chronic cough/MAC Possible stroke versus peripheral-facial nerve dysesthesia related to sinus congestion/TMJ from coughing, possible cervical DDD worsened by ongoing coughing-low suspicion for Bell's palsy -Will first rule out stroke -Will get routine stroke workup as follows: -Permissive hypertension for first 24-48 hrs post stroke onset: Prn Labetalol IV or Vasotec IV If BP greater than 220/120  -Statins for LDL goal less than 70 -Daily aspirin   -Telemetry, echo, MRI -Avoid dextrose containing fluids, Maintain euglycemia, euthermia -Neuro checks q4 hrs x 24 hrs and then per shift -Head of bed 30 degrees -Physical therapy/Occupational therapy/Speech therapy if failed dysphagia screen -Can consider neurology consult -Will get CT neck to evaluate for cervical spine disease

## 2024-03-13 NOTE — Assessment & Plan Note (Signed)
 History of PVCs/SVT low burden on ZIO 02/27/2024 Bradycardia at baseline

## 2024-03-13 NOTE — Progress Notes (Signed)
 PT Cancellation Note  Patient Details Name: Alison Bradshaw MRN: [id number] DOB: 06/12/1940   Cancelled Treatment:       PT attempted evaluation this AM, obtained history from 8:55-9:13, however RN came in and then breakfast was delivered. Therapist left to allow patient to eat breakfast. Re-attempted and returned but SLP was present, then the doctor was present Unable to return for evaluation prior to discharge orders. However based on communication with occupational therapist no acute care skilled PT needs present.   Vasil Juhasz,MargaretPT, DPT 03/13/2024, 2:57 PM

## 2024-03-13 NOTE — Assessment & Plan Note (Addendum)
 Moderate persistent asthma Continue home doxycycline  started on 03/12/2024 for recent worsening cough Continue home inhalers DuoNebs as needed Antitussives Flutter valve and incentive spirometer Continue outpatient follow-up with pulmonology

## 2024-03-13 NOTE — ED Provider Notes (Signed)
 [facility] Provider Note    Event Date/Time   First MD Initiated Contact with Patient 03/13/24 0038     (approximate)   History   No chief complaint on file.   HPI  Alison Bradshaw is a 83 y.o. 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  Last known normal around 7 PM when she went to [facility] to pick up her doxycycline  prescription.  No other focal neurologic deficits noted by patient in the facial numbness sensation persists now.  No other acute medical complaints.  Independent Historian contributed to assessment above: Fianc at bedside corroborates information above  External Medical Documents Reviewed: Outpatient notes recently      Physical Exam   Triage Vital Signs: ED Triage Vitals  Encounter Vitals Group     BP 03/13/24 0039 (!) (P) 114/100     Girls Systolic BP Percentile --      Girls Diastolic BP Percentile --      Boys Systolic BP Percentile --      Boys Diastolic BP Percentile --      Pulse Rate 03/13/24 0039 (P) 71     Resp 03/13/24 0039 (P) 16     Temp 03/13/24 0039 (P) 97.6 F (36.4 C)     Temp src --      SpO2 03/13/24 0039 (P) 100 %     Weight 03/13/24 0037 140 lb (63.5 kg)     Height 03/13/24 0037 5' 3 (1.6 m)     Head Circumference --      Peak Flow --      Pain Score 03/13/24 0036 8     Pain Loc --      Pain Education --      Exclude from Growth Chart --     Most recent vital signs: Vitals:   03/13/24 0039 03/13/24 0135  BP: (!) (P) 114/100   Pulse: (P) 71   Resp: (P) 16   Temp: (P) 97.6 F (36.4 C)   SpO2: (P) 100% 100%    General: Awake, no distress.  CV:  Good peripheral perfusion.   Resp:  Normal effort.  Abd:  No distention.  Other:  Subjective facial numbness to the left side of her face only.  No facial asymmetry dysarthria or other motor or sensory deficits.  Finger-to-nose coordination is normal.  Clear lungs without obvious focality or wheezing.  Breathing comfortably on room air 100% oxygenation.   ED Results / Procedures / Treatments   Labs (all labs ordered are listed, but only abnormal results are displayed) Labs Reviewed  RESP PANEL BY RT-PCR (RSV, FLU A&B, COVID)  [id number]  PROTIME-INR  APTT  CBC  DIFFERENTIAL  COMPREHENSIVE METABOLIC PANEL WITH GFR  CBG MONITORING, ED    EKG  ED ECG REPORT I, Ginnie Shams, the attending physician, personally viewed and interpreted this ECG.   Date: 03/13/2024  EKG Time: 0134  Rate: 58  Rhythm: sinus  Axis: nl  Intervals:nl  ST&T Change: no stemi    RADIOLOGY I independently reviewed and interpreted CT of the head and see no obvious bleed or midline shift I also reviewed radiologist's formal read.   PROCEDURES:  Critical Care  performed: Yes, see critical care procedure note(s)  .Critical Care  Performed by: Cyrena Mylar, MD Authorized by: Cyrena Mylar, MD   Critical care provider statement:    Critical care time (minutes):  30   Critical care was time spent personally by me on the following activities:  Development of treatment plan with patient or surrogate, discussions with consultants, evaluation of patient's response to treatment, examination of patient, ordering and review of laboratory studies, ordering and review of radiographic studies, ordering and performing treatments and interventions, pulse oximetry, re-evaluation of patient's condition and review of old charts    MEDICATIONS ORDERED IN ED: Medications  acetaminophen  (TYLENOL ) tablet 1,000 mg (has no administration in time range)  doxycycline  (VIBRA -TABS) tablet 100 mg (has no administration in time range)  ketorolac  (TORADOL ) 15 MG/ML  injection 15 mg (has no administration in time range)    External physician / consultants:  I spoke with hospital medicine for admission and regarding care plan for this patient.   IMPRESSION / MDM / ASSESSMENT AND PLAN / ED COURSE  I reviewed the triage vital signs and the nursing notes.                                Patient's presentation is most consistent with acute presentation with potential threat to life or bodily function.  Differential diagnosis includes, but is not limited to, stroke, complex migraine, respiratory infection, atypical pneumonia considered but less likely ICH dissection sah   The patient is on the cardiac monitor to evaluate for evidence of arrhythmia and/or significant heart rate changes.  MDM:    Patient with acute neurologic deficit outside of the time window for thrombolytic, with very mild symptoms not debilitating not a thrombolytic candidate.  Will proceed with nonthrombolytic candidate stroke order set including CT head, stroke labs.  Ongoing left-sided facial numbness now without any other neurologic deficits.  Mild headache along with her productive cough over the last several weeks with known MAC and started on doxycycline  just last evening by her doctor, will continue with this medication while inpatient.  Neck supple full range of motion no meningismus doubt meningitis.  Headache has been mild intermittent and associated with a respiratory infection, I doubt more sinister etiologies like spontaneous bleeding, dissections, meningitis.  May also represent complex migraine.  Will give Tylenol , Toradol  for pain control.  CT head negative.  Pending the results of her blood test, anticipate admission for further stroke/TIA workup given ongoing neurologic deficit acute.        FINAL CLINICAL IMPRESSION(S) / ED DIAGNOSES   Final diagnoses:  Left facial numbness  Subacute cough     Rx / DC Orders   ED Discharge Orders     None         Note:  This document was prepared using Dragon voice recognition software and may include unintentional dictation errors.    Cyrena Mylar, MD 03/13/24 [phone]

## 2024-03-13 NOTE — Progress Notes (Signed)
 SLP Cancellation Note  Patient Details Name: Alison Bradshaw MRN: [id number] DOB: Jul 31, 1940   Cancelled treatment:       Reason Eval/Treat Not Completed: SLP screened, no needs identified, will sign off  Pt endorses residual left facial numbness and headache pain. Denied any change in language and cognition- reports numbness is frustrating, though able to eat/speak without overt difficulty. Education shared regarding monitoring symptoms and requesting follow up SLP services if desired. Pt reported understanding. No further acute SLP services indicated.   Raychelle Hudman Clapp, MS, CCC-SLP Speech Language Pathologist Rehab Services; [facility] [phone] (ascom)   Jaecob Lowden J Clapp 03/13/2024, 1:12 PM

## 2024-03-13 NOTE — ED Triage Notes (Signed)
 Pt presetns for left sided facial numbness/fullness. Persistent headache x2 weeks and tonight around 1900, pt noted she had left sided facial. No obvious facial droop or speech discrepancy per husband. No weakness noted. Endorsing unsteadiness. Denies vision changes

## 2024-03-13 NOTE — Assessment & Plan Note (Signed)
 Clinically euvolemic Continue home GDMT

## 2024-03-13 NOTE — Discharge Summary (Signed)
 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 *  Hospital Course:  Alison Bradshaw is a 83 y.o. 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   She denies visual disturbance, facial droop, slurred speech, weakness in the extremities.  She describes the numbness as radiating from the left preauricular area down to the jaw.  Also notes new headache (bifrontal and occipital) x 2 weeks, with upper neck pain that is intermittent and severe.  Her symptoms were preceded by 5 weeks of a congested cough with sinus congestion occasional right ear pain.  She has had 3 rounds of antibiotics, most recently doxycycline  started the day prior.  States she has never had headaches until 2 weeks ago and neck pain is new as well.  She does have nausea without vomiting.  Has seen ENT in the past a long time ago.   In the ED, BP slightly elevated on arrival at 114/100, otherwise normal vitals.  Blood work including CBC CMP and respiratory viral panel unremarkable.  EKG showed sinus bradycardia at 58.  Head CT  nonacute. Admission for stroke workup requested.  MRI of the brain did not show any acute intracranial pathology, CT scan of the neck showed findings of mild to moderate foraminal stenosis.  C4-C6.  Patient has no weakness of the extremities.  Was able to work with PT OT with no limitations and therefore be discharged today to follow-up with neurosurgeon as an outpatient.    Consultants: None Procedures performed: None Disposition: Home Diet recommendation:  Cardiac diet DISCHARGE MEDICATION: Allergies as of 03/13/2024       Reactions   Percocet [oxycodone -acetaminophen ] Nausea Only   Ceftin [cefuroxime Axetil] Hives, Rash   Timentin [ticarcillin-pot Clavulanate] Hives, Rash        Medication List     STOP taking these medications    amoxicillin -clavulanate 875-125 MG tablet Commonly known as: AUGMENTIN    benzonatate  100 MG capsule Commonly known as: TESSALON    clindamycin  1 % lotion Commonly known as: CLEOCIN  T   fluconazole  150 MG tablet Commonly known as: DIFLUCAN    nystatin  100000 UNIT/ML suspension Commonly known as: MYCOSTATIN    sucralfate  1 g tablet Commonly known as: CARAFATE        TAKE these medications    acetaminophen  500 MG tablet Commonly known as: TYLENOL  Take 1,000 mg by mouth every 6 (six) hours as needed.   AeroChamber MV inhaler Use as instructed   albuterol  (2.5 MG/3ML) 0.083% nebulizer solution Commonly known as: PROVENTIL  Take 3 mLs (2.5 mg total) by nebulization in the morning and at bedtime.   albuterol  108 (90 Base) MCG/ACT inhaler Commonly known as: VENTOLIN  HFA Inhale 2 puffs into the lungs every 6 (six)  hours as needed for shortness of breath.   ALPRAZolam  0.25 MG tablet Commonly known as: XANAX  Take 1 tablet (0.25 mg total) by mouth 2 (two) times daily as needed for anxiety.   budesonide -formoterol  160-4.5 MCG/ACT inhaler Commonly known as: Symbicort  Inhale 2 puffs into the lungs in the morning and at bedtime.    cycloSPORINE  0.05 % ophthalmic emulsion Commonly known as: RESTASIS  Place 1 drop into both eyes 2 (two) times daily.   doxycycline  100 MG tablet Commonly known as: VIBRA -TABS Take 1 tablet (100 mg total) by mouth 2 (two) times daily for 10 days.   fluticasone  50 MCG/ACT nasal spray Commonly known as: FLONASE  Place 1 spray into both nostrils in the morning and at bedtime.   guaiFENesin  600 MG 12 hr tablet Commonly known as: MUCINEX  Take 600 mg by mouth 2 (two) times daily as needed for to loosen phlegm or cough.   mometasone 0.1 % lotion Commonly known as: ELOCON   pantoprazole  40 MG tablet Commonly known as: PROTONIX  TAKE 1 TABLET(40 MG) BY MOUTH DAILY   psyllium 95 % Pack Commonly known as: HYDROCIL/METAMUCIL Take 1 packet by mouth every other day.   rosuvastatin  10 MG tablet Commonly known as: CRESTOR  TAKE 1 TABLET(10 MG) BY MOUTH DAILY   sodium chloride  HYPERTONIC 3 % nebulizer solution Take by nebulization in the morning and at bedtime.        Follow-up Information     Clois Fret, MD. Call.   Specialty: Neurosurgery Contact information: [address] [place] [address] [phone]                Discharge Exam: Fredricka Weights   03/13/24 0037 03/13/24 0412  Weight: 63.5 kg 65.9 kg   Vitals and nursing note reviewed.  Constitutional:      General: She is not in acute distress. HENT:     Head: Normocephalic and atraumatic.  Cardiovascular:     Rate and Rhythm: Normal rate and regular rhythm.     Heart sounds: Normal heart sounds.  Pulmonary:     Effort: Pulmonary effort is normal.     Breath sounds: Normal breath sounds.  Abdominal:     Palpations: Abdomen is soft.     Tenderness: There is no abdominal tenderness.  Neurological:     Mental Status: Mental status is at baseline.   Condition at discharge: good  The results of significant diagnostics from this hospitalization (including imaging,  microbiology, ancillary and laboratory) are listed below for reference.   Imaging Studies: CT CERVICAL SPINE WO CONTRAST Result Date: 03/13/2024 EXAM: CT CERVICAL SPINE WITHOUT CONTRAST 03/13/2024 07:49:55 AM TECHNIQUE: CT of the cervical spine was performed without the administration of intravenous contrast. Multiplanar reformatted images are provided for review. Automated exposure control, iterative reconstruction, and/or weight based adjustment of the mA/kV was utilized to reduce the radiation dose to as low as reasonably achievable. COMPARISON: None available. CLINICAL HISTORY: Neck pain, acute, no red flags. FINDINGS: CERVICAL SPINE: BONES AND ALIGNMENT: There is 2 mm of anterolisthesis of C3 on C4. No acute fracture or traumatic malalignment. DEGENERATIVE CHANGES: At C3-C4, there is mild bilateral facet arthrosis, but no significant spinal canal or neural foraminal stenosis at this level. At C4-C5, there is disc space narrowing and right-sided uncovertebral joint hypertrophy. There is mild central spinal canal stenosis, mild-to-moderate right neural foraminal stenosis and mild left neural foraminal stenosis. At C5-C6, there is posterior endplate ridging with mild central spinal canal stenosis and mild-to-moderate bilateral neural  foraminal stenosis. At C6-C7, there is mild central spinal canal stenosis and mild bilateral neural foraminal stenosis. SOFT TISSUES: No prevertebral soft tissue swelling. LUNGS: There is mild biapical pleural parenchymal fibrosis. IMPRESSION: 1. 2 mm anterolisthesis of C3 on C4 with mild bilateral facet arthrosis, without significant spinal canal or neural foraminal stenosis. 2. C4-5: Disc space narrowing and right-sided uncovertebral joint hypertrophy with mild central spinal canal stenosis, mild-to-moderate right neural foraminal stenosis, and mild left neural foraminal stenosis. 3. C5-6: Posterior endplate ridging with mild central spinal canal stenosis and mild-to-moderate  bilateral neuroforaminal stenosis. Electronically signed by: Evalene Coho MD 03/13/2024 11:58 AM EST RP Workstation: [id number]   MR BRAIN WO CONTRAST Result Date: 03/13/2024 EXAM: MRI BRAIN WITHOUT CONTRAST 03/13/2024 07:12:21 AM TECHNIQUE: Multiplanar multisequence MRI of the head/brain was performed without the administration of intravenous contrast. COMPARISON: CT of the head dated 03/13/2024 and MRI of the head dated 08/25/2023. CLINICAL HISTORY: Neuro deficit, acute, stroke suspected. FINDINGS: BRAIN AND VENTRICLES: No acute infarct. No intracranial hemorrhage. No mass. No midline shift. No hydrocephalus. Mild age commensurate cerebral volume loss present. The sella is unremarkable. Normal flow voids. ORBITS: The patient is status post bilateral lens replacement. No acute abnormality. SINUSES AND MASTOIDS: No acute abnormality. BONES AND SOFT TISSUES: Normal marrow signal. No acute soft tissue abnormality. IMPRESSION: 1. No acute intracranial abnormality. 2. Mild age commensurate cerebral volume loss. Electronically signed by: Evalene Coho MD 03/13/2024 07:16 AM EST RP Workstation: [id number]   DG Chest Portable 1 View Result Date: 03/13/2024 EXAM: 1 VIEW(S) XRAY OF THE CHEST 03/13/2024 01:20:17 AM COMPARISON: 01/07/2024 CLINICAL HISTORY: cough hx mac FINDINGS: LUNGS AND PLEURA: Right mid lung 7 mm nodular density, corresponding to a subpleural nodular density within the right middle lobe seen on prior CT examinations of 02/01/2022 and 05/15/2022. No confluent pulmonary infiltrate. No pleural effusion. No pneumothorax. HEART AND MEDIASTINUM: No acute abnormality of the cardiac and mediastinal silhouettes. BONES AND SOFT TISSUES: No acute osseous abnormality. IMPRESSION: 1. No acute cardiopulmonary process. 2. Stable 7 mm nodular density in the right mid lung zone, unchanged from prior CTs. Electronically signed by: Dorethia Molt MD 03/13/2024 01:30 AM EST RP Workstation: [id number]   CT HEAD WO  CONTRAST Result Date: 03/13/2024 EXAM: CT HEAD WITHOUT CONTRAST 03/13/2024 01:06:41 AM TECHNIQUE: CT of the head was performed without the administration of intravenous contrast. Automated exposure control, iterative reconstruction, and/or weight based adjustment of the mA/kV was utilized to reduce the radiation dose to as low as reasonably achievable. COMPARISON: CT head Aug 25, 2023 CLINICAL HISTORY: Neuro deficit, acute, stroke suspected FINDINGS: BRAIN AND VENTRICLES: No acute hemorrhage. No evidence of acute infarct. No hydrocephalus. No extra-axial collection. No mass effect or midline shift. ORBITS: No acute abnormality. SINUSES: No acute abnormality. SOFT TISSUES AND SKULL: No acute soft tissue abnormality. No skull fracture. IMPRESSION: 1. No acute intracranial abnormality. Electronically signed by: Gilmore Molt MD 03/13/2024 01:12 AM EST RP Workstation: [id number]   LONG TERM MONITOR XT (3-14 DAYS) Result Date: 03/05/2024 Patch Wear Time:  13 days and 23 hours (2025-10-31T10:51:20-0400 to 2025-11-14T09:51:20-0500) Patient had a min HR of 46 bpm, max HR of 185 bpm, and avg HR of 70 bpm. Predominant underlying rhythm was Sinus Rhythm. 20 Supraventricular Tachycardia runs occurred, the run with the fastest interval lasting 15 beats with a max rate of 185 bpm, the longest lasting 13 beats with an avg rate of 103 bpm. Frequent PVCs with a burden of 7.1%. Many triggered events did not correlate with arrhythmia  and some correlated with PVCs. Motion artifact affected the quality of the study.    Microbiology: Results for orders placed or performed during the hospital encounter of 03/13/24  Resp panel by RT-PCR (RSV, Flu A&B, Covid) Anterior Nasal Swab     Status: None   Collection Time: 03/13/24 12:55 AM   Specimen: Anterior Nasal Swab  Result Value Ref Range Status   SARS Coronavirus 2 by RT PCR NEGATIVE NEGATIVE Final    Comment: (NOTE) SARS-CoV-2 target nucleic acids are NOT DETECTED.  The  SARS-CoV-2 RNA is generally detectable in upper respiratory specimens during the acute phase of infection. The lowest concentration of SARS-CoV-2 viral copies this assay can detect is 138 copies/mL. A negative result does not preclude SARS-Cov-2 infection and should not be used as the sole basis for treatment or other patient management decisions. A negative result may occur with  improper specimen collection/handling, submission of specimen other than nasopharyngeal swab, presence of viral mutation(s) within the areas targeted by this assay, and inadequate number of viral copies(<138 copies/mL). A negative result must be combined with clinical observations, patient history, and epidemiological information. The expected result is Negative.  Fact Sheet for Patients:  bloggercourse.com  Fact Sheet for Healthcare Providers:  seriousbroker.it  This test is no t yet approved or cleared by the [place]  FDA and  has been authorized for detection and/or diagnosis of SARS-CoV-2 by FDA under an Emergency Use Authorization (EUA). This EUA will remain  in effect (meaning this test can be used) for the duration of the COVID-19 declaration under Section 564(b)(1) of the Act, 21 U.S.C.section 360bbb-3(b)(1), unless the authorization is terminated  or revoked sooner.       Influenza A by PCR NEGATIVE NEGATIVE Final   Influenza B by PCR NEGATIVE NEGATIVE Final    Comment: (NOTE) The Xpert Xpress SARS-CoV-2/FLU/RSV plus assay is intended as an aid in the diagnosis of influenza from Nasopharyngeal swab specimens and should not be used as a sole basis for treatment. Nasal washings and aspirates are unacceptable for Xpert Xpress SARS-CoV-2/FLU/RSV testing.  Fact Sheet for Patients: bloggercourse.com  Fact Sheet for Healthcare Providers: seriousbroker.it  This test is not yet approved or  cleared by the [place]  FDA and has been authorized for detection and/or diagnosis of SARS-CoV-2 by FDA under an Emergency Use Authorization (EUA). This EUA will remain in effect (meaning this test can be used) for the duration of the COVID-19 declaration under Section 564(b)(1) of the Act, 21 U.S.C. section 360bbb-3(b)(1), unless the authorization is terminated or revoked.     Resp Syncytial Virus by PCR NEGATIVE NEGATIVE Final    Comment: (NOTE) Fact Sheet for Patients: bloggercourse.com  Fact Sheet for Healthcare Providers: seriousbroker.it  This test is not yet approved or cleared by the [place]  FDA and has been authorized for detection and/or diagnosis of SARS-CoV-2 by FDA under an Emergency Use Authorization (EUA). This EUA will remain in effect (meaning this test can be used) for the duration of the COVID-19 declaration under Section 564(b)(1) of the Act, 21 U.S.C. section 360bbb-3(b)(1), unless the authorization is terminated or revoked.  Performed at [facility], [address] [place], [place] [address]     Labs: CBC: Recent Labs  Lab 03/13/24 0054  WBC 5.9  NEUTROABS 3.5  HGB 12.0  HCT 35.4*  MCV 88.9  PLT 183   Basic Metabolic Panel: Recent Labs  Lab 03/13/24 0054  NA 139  K 3.8  CL 103  CO2 25  GLUCOSE 110*  BUN 14  CREATININE 0.75  CALCIUM  9.7   Liver Function Tests: Recent Labs  Lab 03/13/24 0054  AST 30  ALT 16  ALKPHOS 103  BILITOT 0.5  PROT 7.4  ALBUMIN 4.5   CBG: Recent Labs  Lab 03/13/24 0112  GLUCAP 100*    Discharge time spent:  40 minutes.  Signed: Drue ONEIDA Potter, MD [facility] 03/13/2024

## 2024-03-13 NOTE — Progress Notes (Signed)
 PT otf for MRI

## 2024-03-13 NOTE — Plan of Care (Signed)

## 2024-03-13 NOTE — Hospital Course (Signed)
 SABRA

## 2024-03-15 ENCOUNTER — Telehealth: Payer: Self-pay

## 2024-03-15 ENCOUNTER — Telehealth: Payer: Self-pay | Admitting: Internal Medicine

## 2024-03-15 NOTE — Telephone Encounter (Signed)
 PLEASE CALL PATIENT AND OFFICER ER FOLLOW UP VISIT AT 1:00 ON THURSDAY DEC 11

## 2024-03-15 NOTE — Progress Notes (Signed)
 Attached to the wrong encounter. Sent in another telephone encounter to front desk to get patient scheduled for appointment per provider's request.

## 2024-03-15 NOTE — Telephone Encounter (Signed)
 PLEASE CALL PATIENT AND OFFICER ER FOLLOW UP VISIT AT 1:00 ON THURSDAY DEC 11. Per Dr Marylynn, MD request.

## 2024-03-15 NOTE — Telephone Encounter (Signed)
 Spoke with Alison Bradshaw and she is getting it corrected and notify the patient.

## 2024-03-15 NOTE — Telephone Encounter (Signed)
 At the time of message received, Dr Marylynn does not have any available slots for this week.    Copied from CRM [id number]. Topic: Appointments - Appointment Scheduling >> Mar 15, 2024  8:54 AM Viola F wrote: Patient seen Dr. Marylynn at church yesterday and was told to schedule with her asap. I scheduled her for next available 03/25/24 but she says Dr. Marylynn wants to see her sooner than that. I added her to waitlist.

## 2024-03-15 NOTE — Telephone Encounter (Signed)
 Noted

## 2024-03-15 NOTE — Transitions of Care (Post Inpatient/ED Visit) (Signed)
 03/15/2024  Name: Alison Bradshaw MRN: [id number] DOB: 08/23/1940  Today's TOC FU Call Status: Today's TOC FU Call Status:: Successful TOC FU Call Completed TOC FU Call Complete Date: 03/15/24  Patient's Name and Date of Birth confirmed. Name, DOB  Transition Care Management Follow-up Telephone Call Date of Discharge: 03/13/24 Discharge Facility: [facility]) Type of Discharge: Inpatient Admission Primary Inpatient Discharge Diagnosis:: numbness in face How have you been since you were released from the hospital?: Better Any questions or concerns?: No  Items Reviewed: Did you receive and understand the discharge instructions provided?: Yes Medications obtained,verified, and reconciled?: Yes (Medications Reviewed) Any new allergies since your discharge?: No Dietary orders reviewed?: Yes Do you have support at home?: Yes People in Home [RPT]: significant other, other relative(s)  Medications Reviewed Today: Medications Reviewed Today     Reviewed by Emmitt Pan, LPN (Licensed Practical Nurse) on 03/15/24 at 1612  Med List Status: <None>   Medication Order Taking? Sig Documenting Provider Last Dose Status Informant  acetaminophen  (TYLENOL ) 500 MG tablet [id number] Yes Take 1,000 mg by mouth every 6 (six) hours as needed. [provider]  Active Self  albuterol  (PROVENTIL ) (2.5 MG/3ML) 0.083% nebulizer solution [id number] Yes Take 3 mLs (2.5 mg total) by nebulization in the morning and at bedtime. Isadora Hose, MD  Active Self  albuterol  (VENTOLIN  HFA) 108 (90 Base) MCG/ACT inhaler [id number] Yes Inhale 2 puffs into the lungs every 6 (six) hours as needed for shortness of breath. Marylynn Verneita CROME, MD  Active Self  ALPRAZolam  (XANAX ) 0.25 MG tablet [id number] Yes Take 1 tablet (0.25 mg total) by mouth 2 (two) times daily as needed for anxiety. Tullo, Teresa L, MD  Active Self  budesonide -formoterol  (SYMBICORT ) 160-4.5 MCG/ACT inhaler  [id number] Yes Inhale 2 puffs into the lungs in the morning and at bedtime. Malka Domino, MD  Active Self           Med Note ABE EVALENE DEL   Tue Aug 26, 2023  9:56 AM)    cycloSPORINE  (RESTASIS ) 0.05 % ophthalmic emulsion [id number] Yes Place 1 drop into both eyes 2 (two) times daily. [provider]  Active Self  doxycycline  (VIBRA -TABS) 100 MG tablet [id number] Yes Take 1 tablet (100 mg total) by mouth 2 (two) times daily for 10 days. Vincente Saber, NP  Active Self  fluticasone  (FLONASE ) 50 MCG/ACT nasal spray [id number] Yes Place 1 spray into both nostrils in the morning and at bedtime. Malka Domino, MD  Active Self  guaiFENesin  (MUCINEX ) 600 MG 12 hr tablet [id number] Yes Take 600 mg by mouth 2 (two) times daily as needed for to loosen phlegm or cough. [provider]  Active Self  mometasone (ELOCON) 0.1 % lotion [id number] Yes  [provider]  Active Self  pantoprazole  (PROTONIX ) 40 MG tablet [id number] Yes TAKE 1 TABLET(40 MG) BY MOUTH DAILY Tullo, Teresa L, MD  Active Self  psyllium (HYDROCIL/METAMUCIL) 95 % PACK [id number] Yes Take 1 packet by mouth every other day. [provider]  Active Self  rosuvastatin  (CRESTOR ) 10 MG tablet [id number] Yes TAKE 1 TABLET(10 MG) BY MOUTH DAILY Marylynn Verneita CROME, MD  Active Self  sodium chloride  HYPERTONIC 3 % nebulizer solution [id number] Yes Take by nebulization in the morning and at bedtime. Isadora Hose, MD  Active Self  Spacer/Aero-Holding Chambers (AEROCHAMBER MV) inhaler [id number] Yes Use as instructed Assaker, Domino, MD  Active Self            Home Care and  Equipment/Supplies: Were Home Health Services Ordered?: NA Any new equipment or medical supplies ordered?: NA  Functional Questionnaire: Do you need assistance with bathing/showering or dressing?: No Do you need assistance with meal preparation?: No Do you need assistance with eating?: No Do you have difficulty maintaining  continence: No Do you need assistance with getting out of bed/getting out of a chair/moving?: No Do you have difficulty managing or taking your medications?: No  Follow up appointments reviewed: PCP Follow-up appointment confirmed?: Yes Date of PCP follow-up appointment?: 03/18/24 Follow-up Provider: [facility] Follow-up appointment confirmed?: No Reason Specialist Follow-Up Not Confirmed: Patient has Specialist Provider Number and will Call for Appointment Do you need transportation to your follow-up appointment?: No Do you understand care options if your condition(s) worsen?: Yes-patient verbalized understanding    SIGNATURE Julian Lemmings, LPN [facility] Nurse Health Advisor Direct Dial [phone]

## 2024-03-16 ENCOUNTER — Encounter: Payer: Self-pay | Admitting: Nurse Practitioner

## 2024-03-16 DIAGNOSIS — J069 Acute upper respiratory infection, unspecified: Secondary | ICD-10-CM | POA: Insufficient documentation

## 2024-03-16 DIAGNOSIS — J45909 Unspecified asthma, uncomplicated: Secondary | ICD-10-CM | POA: Diagnosis not present

## 2024-03-16 DIAGNOSIS — J45998 Other asthma: Secondary | ICD-10-CM | POA: Diagnosis not present

## 2024-03-16 DIAGNOSIS — N760 Acute vaginitis: Secondary | ICD-10-CM | POA: Insufficient documentation

## 2024-03-16 NOTE — Assessment & Plan Note (Signed)
 Symptoms suggestive of yeast infection, considering prescription due to antibiotic use. - Started of Diflucan .

## 2024-03-16 NOTE — Assessment & Plan Note (Addendum)
 Persistent respiratory symptoms with cough, thick mucus, and nasal congestion. Headaches and nausea likely due to sinus congestion. POCT COVID negative. - Chest x-ray pending. - Will start on doxycycline , discussed medication side effect.  - Advised Mucinex  (guaifenesin ) for mucus thinning. - Encouraged fluid intake. - Continue fluticasone  nasal spray twice daily. - Recommended Benadryl  at night. Orders:   DG Chest 2 View   POC COVID-19 BinaxNow

## 2024-03-18 ENCOUNTER — Ambulatory Visit: Admitting: Internal Medicine

## 2024-03-18 ENCOUNTER — Ambulatory Visit: Payer: Self-pay | Admitting: Nurse Practitioner

## 2024-03-18 ENCOUNTER — Encounter: Payer: Self-pay | Admitting: Internal Medicine

## 2024-03-18 VITALS — BP 110/62 | HR 59 | Resp 99 | Ht 63.0 in | Wt 143.8 lb

## 2024-03-18 DIAGNOSIS — J069 Acute upper respiratory infection, unspecified: Secondary | ICD-10-CM | POA: Diagnosis not present

## 2024-03-18 DIAGNOSIS — J300 Vasomotor rhinitis: Secondary | ICD-10-CM

## 2024-03-18 DIAGNOSIS — G4452 New daily persistent headache (NDPH): Secondary | ICD-10-CM | POA: Diagnosis not present

## 2024-03-18 DIAGNOSIS — R42 Dizziness and giddiness: Secondary | ICD-10-CM

## 2024-03-18 DIAGNOSIS — Z7189 Other specified counseling: Secondary | ICD-10-CM | POA: Diagnosis not present

## 2024-03-18 DIAGNOSIS — J8410 Pulmonary fibrosis, unspecified: Secondary | ICD-10-CM

## 2024-03-18 DIAGNOSIS — R200 Anesthesia of skin: Secondary | ICD-10-CM | POA: Diagnosis not present

## 2024-03-18 DIAGNOSIS — M4802 Spinal stenosis, cervical region: Secondary | ICD-10-CM | POA: Diagnosis not present

## 2024-03-18 MED ORDER — AZELASTINE-FLUTICASONE 137-50 MCG/ACT NA SUSP: 1.0000 | Freq: Two times a day (BID) | NASAL | 2 refills | Status: DC

## 2024-03-18 MED ORDER — PREDNISONE 10 MG PO TABS: ORAL_TABLET | ORAL | 0 refills | Status: DC

## 2024-03-18 NOTE — Patient Instructions (Addendum)
 STOP  THE DOXYCYCLINE  , IT IS CAUSING YOUR NAUSEA.  Resume atrovent  first  and zyrtec at night and continue flonase   Prednisone  taper for the headache   If the drip does not  improve ,  GO SEE DR MILISSA  FOR THE CHRONIC DRAINAGE   Neurology  referral for headaches   Neurosurgery or physiatry referral to follow   I'll resend the PT referral to [facility]

## 2024-03-18 NOTE — Progress Notes (Unsigned)
 Subjective:  Patient ID: Alison Bradshaw, female    DOB: 13-May-1940  Age: 83 y.o. MRN: [id number]  CC: There were no encounter diagnoses.   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  Presetns for hospital follow up.    Treated with Doxy on Dec 5 as outpatient for productive cough.  CXR NOTED ONLY HYPERINFLATION.  SHE WAS  Admitted to [facility] via ER on Dec 6 for headache, left sided facial numbness,  CT and MRI brain negative for strokes.  Chest x ray : NO ACUTE FINDINGS.  SINUSES NORMAL ON MRI     HEADACHES:  SOME ARE FRONTAL . SOME ARE OCCIPITAL .  STARTED 3-4 WEEKS AGO, ACCOMPANIED BY DIZZINESS.   TAKING SYMBICORT , FLUTICASONE  , STOPPED BENADRYL     Outpatient Medications Prior to Visit  Medication Sig Dispense Refill   acetaminophen  (TYLENOL ) 500 MG tablet Take 1,000 mg by mouth every 6 (six) hours as needed.     albuterol  (PROVENTIL ) (2.5 MG/3ML) 0.083% nebulizer solution Take 3 mLs (2.5 mg total) by nebulization in the morning and at bedtime. 75 mL 11   albuterol  (VENTOLIN  HFA) 108 (90 Base) MCG/ACT inhaler Inhale 2 puffs into the lungs every 6 (six) hours as needed for shortness of breath. 3 each 0   ALPRAZolam  (XANAX ) 0.25 MG tablet Take 1 tablet (0.25 mg total) by mouth 2 (two) times daily as needed for anxiety. 45 tablet 5   budesonide -formoterol  (SYMBICORT ) 160-4.5 MCG/ACT inhaler Inhale 2 puffs into the lungs in the morning and at bedtime. 1 each 12   cycloSPORINE  (RESTASIS ) 0.05 % ophthalmic emulsion Place 1 drop into both eyes 2 (two) times daily.     doxycycline  (VIBRA -TABS) 100 MG tablet Take 1 tablet (100 mg total) by mouth 2 (two) times daily for 10 days. 20  tablet 0   fluticasone  (FLONASE ) 50 MCG/ACT nasal spray Place 1 spray into both nostrils in the morning and at bedtime. 100 mL 2   guaiFENesin  (MUCINEX ) 600 MG 12 hr tablet Take 600 mg by mouth 2 (two) times daily as needed for to loosen phlegm or cough.     mometasone (ELOCON) 0.1 % lotion      pantoprazole  (PROTONIX ) 40 MG tablet TAKE 1 TABLET(40 MG) BY MOUTH DAILY 90 tablet 3   psyllium (HYDROCIL/METAMUCIL) 95 % PACK Take 1 packet by mouth every other day.     rosuvastatin  (CRESTOR ) 10 MG tablet TAKE 1 TABLET(10 MG) BY MOUTH DAILY 90 tablet 1   sodium chloride  HYPERTONIC 3 % nebulizer solution Take by nebulization in the morning and at bedtime. 750 mL 12   Spacer/Aero-Holding Chambers (AEROCHAMBER MV) inhaler Use as instructed 1 each 0   No facility-administered medications prior to visit.    Review of Systems;  Patient denies headache, fevers, malaise, unintentional weight loss, skin rash, eye pain, sinus congestion and sinus pain, sore throat, dysphagia,  hemoptysis , cough, dyspnea, wheezing, chest pain, palpitations, orthopnea, edema, abdominal pain, nausea, melena, diarrhea, constipation, flank pain, dysuria, hematuria, urinary  Frequency, nocturia, numbness, tingling, seizures,  Focal weakness, Loss of consciousness,  Tremor, insomnia, depression, anxiety, and suicidal ideation.      Objective:  There were no  vitals taken for this visit.  BP Readings from Last 3 Encounters:  03/13/24 (!) 109/53  03/12/24 118/68  03/01/24 122/68    Wt Readings from Last 3 Encounters:  03/13/24 145 lb 4.5 oz (65.9 kg)  03/12/24 142 lb (64.4 kg)  03/01/24 142 lb 9.6 oz (64.7 kg)    Physical Exam  Lab Results  Component Value Date   HGBA1C 6.2 12/30/2023    Lab Results  Component Value Date   CREATININE 0.75 03/13/2024   CREATININE 0.88 01/07/2024   CREATININE 0.98 12/30/2023    Lab Results  Component Value Date   WBC 5.9 03/13/2024   HGB 12.0 03/13/2024   HCT 35.4 (L)  03/13/2024   PLT 183 03/13/2024   GLUCOSE 110 (H) 03/13/2024   CHOL 165 03/13/2024   TRIG 63 03/13/2024   HDL 57 03/13/2024   LDLDIRECT 101.0 12/30/2023   LDLCALC 95 03/13/2024   ALT 16 03/13/2024   AST 30 03/13/2024   NA 139 03/13/2024   K 3.8 03/13/2024   CL 103 03/13/2024   CREATININE 0.75 03/13/2024   BUN 14 03/13/2024   CO2 25 03/13/2024   TSH 0.68 12/30/2023   INR 0.9 03/13/2024   HGBA1C 6.2 12/30/2023    CT CERVICAL SPINE WO CONTRAST Result Date: 03/13/2024 EXAM: CT CERVICAL SPINE WITHOUT CONTRAST 03/13/2024 07:49:55 AM TECHNIQUE: CT of the cervical spine was performed without the administration of intravenous contrast. Multiplanar reformatted images are provided for review. Automated exposure control, iterative reconstruction, and/or weight based adjustment of the mA/kV was utilized to reduce the radiation dose to as low as reasonably achievable. COMPARISON: None available. CLINICAL HISTORY: Neck pain, acute, no red flags. FINDINGS: CERVICAL SPINE: BONES AND ALIGNMENT: There is 2 mm of anterolisthesis of C3 on C4. No acute fracture or traumatic malalignment. DEGENERATIVE CHANGES: At C3-C4, there is mild bilateral facet arthrosis, but no significant spinal canal or neural foraminal stenosis at this level. At C4-C5, there is disc space narrowing and right-sided uncovertebral joint hypertrophy. There is mild central spinal canal stenosis, mild-to-moderate right neural foraminal stenosis and mild left neural foraminal stenosis. At C5-C6, there is posterior endplate ridging with mild central spinal canal stenosis and mild-to-moderate bilateral neural foraminal stenosis. At C6-C7, there is mild central spinal canal stenosis and mild bilateral neural foraminal stenosis. SOFT TISSUES: No prevertebral soft tissue swelling. LUNGS: There is mild biapical pleural parenchymal fibrosis. IMPRESSION: 1. 2 mm anterolisthesis of C3 on C4 with mild bilateral facet arthrosis, without significant spinal  canal or neural foraminal stenosis. 2. C4-5: Disc space narrowing and right-sided uncovertebral joint hypertrophy with mild central spinal canal stenosis, mild-to-moderate right neural foraminal stenosis, and mild left neural foraminal stenosis. 3. C5-6: Posterior endplate ridging with mild central spinal canal stenosis and mild-to-moderate bilateral neuroforaminal stenosis. Electronically signed by: Evalene Coho MD 03/13/2024 11:58 AM EST RP Workstation: [id number]   MR BRAIN WO CONTRAST Result Date: 03/13/2024 EXAM: MRI BRAIN WITHOUT CONTRAST 03/13/2024 07:12:21 AM TECHNIQUE: Multiplanar multisequence MRI of the head/brain was performed without the administration of intravenous contrast. COMPARISON: CT of the head dated 03/13/2024 and MRI of the head dated 08/25/2023. CLINICAL HISTORY: Neuro deficit, acute, stroke suspected. FINDINGS: BRAIN AND VENTRICLES: No acute infarct. No intracranial hemorrhage. No mass. No midline shift. No hydrocephalus. Mild age commensurate cerebral volume loss present. The sella is unremarkable. Normal flow voids. ORBITS: The patient is status post bilateral lens replacement. No acute abnormality. SINUSES AND MASTOIDS: No acute abnormality. BONES AND SOFT TISSUES: Normal marrow signal. No  acute soft tissue abnormality. IMPRESSION: 1. No acute intracranial abnormality. 2. Mild age commensurate cerebral volume loss. Electronically signed by: Evalene Coho MD 03/13/2024 07:16 AM EST RP Workstation: [id number]   DG Chest Portable 1 View Result Date: 03/13/2024 EXAM: 1 VIEW(S) XRAY OF THE CHEST 03/13/2024 01:20:17 AM COMPARISON: 01/07/2024 CLINICAL HISTORY: cough hx mac FINDINGS: LUNGS AND PLEURA: Right mid lung 7 mm nodular density, corresponding to a subpleural nodular density within the right middle lobe seen on prior CT examinations of 02/01/2022 and 05/15/2022. No confluent pulmonary infiltrate. No pleural effusion. No pneumothorax. HEART AND MEDIASTINUM: No acute  abnormality of the cardiac and mediastinal silhouettes. BONES AND SOFT TISSUES: No acute osseous abnormality. IMPRESSION: 1. No acute cardiopulmonary process. 2. Stable 7 mm nodular density in the right mid lung zone, unchanged from prior CTs. Electronically signed by: Dorethia Molt MD 03/13/2024 01:30 AM EST RP Workstation: [id number]   CT HEAD WO CONTRAST Result Date: 03/13/2024 EXAM: CT HEAD WITHOUT CONTRAST 03/13/2024 01:06:41 AM TECHNIQUE: CT of the head was performed without the administration of intravenous contrast. Automated exposure control, iterative reconstruction, and/or weight based adjustment of the mA/kV was utilized to reduce the radiation dose to as low as reasonably achievable. COMPARISON: CT head Aug 25, 2023 CLINICAL HISTORY: Neuro deficit, acute, stroke suspected FINDINGS: BRAIN AND VENTRICLES: No acute hemorrhage. No evidence of acute infarct. No hydrocephalus. No extra-axial collection. No mass effect or midline shift. ORBITS: No acute abnormality. SINUSES: No acute abnormality. SOFT TISSUES AND SKULL: No acute soft tissue abnormality. No skull fracture. IMPRESSION: 1. No acute intracranial abnormality. Electronically signed by: Gilmore Molt MD 03/13/2024 01:12 AM EST RP Workstation: [id number]    Assessment & Plan:  .There are no diagnoses linked to this encounter.   I spent 34 minutes on the day of this face to face encounter reviewing patient's  most recent visit with cardiology,  nephrology,  and neurology,  prior relevant surgical and non surgical procedures, recent  labs and imaging studies, counseling on weight management,  reviewing the assessment and plan with patient, and post visit ordering and reviewing of  diagnostics and therapeutics with patient  .   Follow-up: No follow-ups on file.   Verneita LITTIE Kettering, MD

## 2024-03-19 ENCOUNTER — Encounter: Payer: Self-pay | Admitting: Internal Medicine

## 2024-03-19 DIAGNOSIS — M4802 Spinal stenosis, cervical region: Secondary | ICD-10-CM | POA: Insufficient documentation

## 2024-03-19 DIAGNOSIS — G4452 New daily persistent headache (NDPH): Secondary | ICD-10-CM | POA: Insufficient documentation

## 2024-03-19 NOTE — Assessment & Plan Note (Signed)
 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

## 2024-03-19 NOTE — Assessment & Plan Note (Signed)
 Referring to Dr Major for [facility].  She is not interested in surgery

## 2024-03-19 NOTE — Assessment & Plan Note (Addendum)
 Symptoms of rhniorrhea improved with atrovent /. Advised to stop doxycycline ,  continue flonase  and resume atrovent .

## 2024-03-19 NOTE — Assessment & Plan Note (Signed)
 She has no indication for antibiotics.  Explained that her symptoms are viral and aggravated by chronic conditions that will not improve with empiric antibiotics and will increase herrisk for antibiotic assoicated complications.  Stopping doxycycline  as she is having daily nausea

## 2024-03-19 NOTE — Assessment & Plan Note (Signed)
 DNR status  was requested by patient in September  after risks and benefits of the order were discussed .  Order given to patient and placed in chart.

## 2024-03-19 NOTE — Assessment & Plan Note (Signed)
 Resolved without sequelae or evidence of CVA.  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  Reerring to Neurology

## 2024-03-25 ENCOUNTER — Inpatient Hospital Stay: Admitting: Internal Medicine

## 2024-03-29 ENCOUNTER — Ambulatory Visit
Admission: RE | Admit: 2024-03-29 | Discharge: 2024-03-29 | Disposition: A | Discharge status: Home / Self Care | Payer: Self-pay | Source: Ambulatory Visit | Attending: Family Medicine | Admitting: Family Medicine

## 2024-03-29 VITALS — BP 131/65 | HR 81 | Temp 97.7°F | Resp 18 | Wt 143.8 lb

## 2024-03-29 DIAGNOSIS — H5713 Ocular pain, bilateral: Secondary | ICD-10-CM | POA: Diagnosis not present

## 2024-03-29 MED ORDER — BACITRACIN-POLYMYXIN B 500-10000 UNIT/GM OP OINT: TOPICAL_OINTMENT | Freq: Three times a day (TID) | OPHTHALMIC | Status: DC

## 2024-03-29 NOTE — Discharge Instructions (Addendum)
 Resume using your Restasis  and Refresh eye drops.  Use the antibiotic ointment 3 times a day for 7 days. Follow up with your primary eyecare provider or [facility], if symptoms suddenly worsen or you have little improvement in your eye symptoms.

## 2024-03-29 NOTE — ED Provider Notes (Addendum)
 " [facility]    CSN: [id number] Arrival date & time: 03/29/24  1047      History   Chief Complaint Chief Complaint  Patient presents with   Eye Problem    My eyes are both extremely sensitive to light, watery,  burning feeling and pinkish - Entered by patient   Photophobia    HPI HPI  Latica Hohmann is a 83 y.o. female.    Conita presents for bilateral eye pain with redness and discharge that started 3 days ago.  Reports burning sensation and light sensitivity.  Restasis  and refresh eye drops tried but she stopped these to see if that was causing her symptoms.  Tywana does not feel like anything is in her eye.    Tityana does wear glasses. She contacted her eye doctor but she hasn't heard back from her eye doctor.   Lanitra has otherwise been well and has no additional concerns today.     Past Medical History:  Diagnosis Date   Anxiety    Arthritis    Asthma    Atypical chest pain    Barrett's esophagus    Chronic gastritis    COPD (chronic obstructive pulmonary disease) (HCC)    Diastolic dysfunction    a. 08/2020 Echo: EF 60-65%, no rwma, Nl RV fxn, mild MR; b. 01/2024 Echo: EF 55-60%, no rwma, GrI DD, nl RV fxn, mild MR, mild-mod TR.   Diverticulosis    Duodenal ulcer    Gastritis    GERD (gastroesophageal reflux disease)    Hiatal hernia    Hyperlipidemia    Mycobacterium avium complex (HCC)    Non-obstructive CAD (coronary artery disease)    a. 10/2017 Stress Echo: Nl EF, no ischemia. Mild MR; b. 12/2017 Cor CTA: LM nl, LAD 50p, 30d, D1/2 nl, RI nl, LCX nl, OM1/2 nl, RCA nl, RPDA/RPL nl. Ca2+ score = 39 (46th percentile)-->statin dose escalated; c. 10/2021 MV: No ischemia/infarct. Nl LV fxn; d. 01/2024 Cor CTA: Ca2+ = 62.8 (38th%'ile). LM nl, LAD <25, LCX nl, RCA nl.   Osteopenia    Peptic ulcer, site unspecified, unspecified as acute or chronic, without hemorrhage or perforation 11/19/2012   Formatting of this note might be different from the  original.  duodenum 1966  Formatting of this note might be different from the original.  Overview:   duodenum 1966   PSVT (paroxysmal supraventricular tachycardia)    a. 10/2017 Holter: PSVT, max 5 beats->did not tolerate beta blocker; b. 09/2020 Zio: 38 runs of SVT. Longest 18.1 secs (106), fastest 203 bpm (6 beats). Occas PVCs (1.1%) - some assoc w/ triggered events; C. 11/2022 Zio: Predominantly sinus rhythm @ 69 (48-182).  34 runs of PSVT, longest 12 beats.  3.3% PVC burden.  No sustained arrhythmias or A-fib.   Pulmonary nodules    a. 12/2017 Cardiac CTA: RLL 9mm nodule; b. 04/2018 RLL 9mm nodule resolved; c. 09/2018 CT Chest: Previously noted posterior RUL nodules almost completely resolved. Bandlike scarring of RML and lingula-->atyp infxn; d. 11/2022 CT Chest: Clustered bilateral solid pulmonary nodules and groundglass opacities, most pronounced in posterior RUL, RML, and lingula - ? chronic atyp infxn. New 6mm RLL nodule.   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  Patient Active Problem List   Diagnosis Date Noted   Degenerative cervical spinal stenosis 03/19/2024   New daily persistent headache 03/19/2024   URI with cough and congestion 03/16/2024   Left facial numbness 03/13/2024   Moderate persistent asthma 03/13/2024   Mycobacterium avium complex (HCC) 03/13/2024   Sinus bradycardia 03/13/2024   Coronary artery disease, nonobstructive 03/13/2024   Chronic heart failure with preserved ejection fraction (HFpEF) (HCC) 03/13/2024   Pharyngitis 03/02/2024   H/O right flank pain 03/02/2024   Drug-induced neutrophilia 01/01/2024   Encounter for preventive health examination 12/31/2023   Raynaud disease 11/04/2023   Neck pain 07/21/2023   PAD  (peripheral artery disease) 03/21/2023   Shortness of breath on exertion 02/14/2023   Vasomotor rhinitis 01/22/2023   DNR (do not resuscitate) discussion 12/16/2022   Light-headed feeling 11/07/2022   SVT (supraventricular tachycardia) 11/07/2022   Insomnia 08/31/2022   Benign endometrial hyperplasia 04/04/2022   Zenker's diverticulum - asymptomatic 03/27/2019   Irritable bowel syndrome with diarrhea 03/27/2019   Alopecia 05/14/2018   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   Prediabetes 12/21/2015   Pain in right hip 12/21/2015   Trochanteric bursitis of right hip 12/21/2015   Osteopenia 09/21/2014   Mild intermittent asthma 03/23/2014   Mixed hyperlipidemia 02/22/2014   Senile nuclear sclerosis 12/14/2012   Gastroesophageal reflux disease 11/19/2012   Rosacea 11/19/2012   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   Multiple gastric polyps 11/02/2012    Past Surgical History:  Procedure Laterality Date   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   BREAST BIOPSY Left 2008   neg   BREAST EXCISIONAL BIOPSY Left 2004   neg   CATARACT EXTRACTION W/ INTRAOCULAR LENS  IMPLANT, BILATERAL Bilateral    COLONOSCOPY WITH PROPOFOL  N/A 01/22/2017   Procedure: COLONOSCOPY WITH PROPOFOL ;  Surgeon: Toledo, Ladell POUR, MD;  Location: [facility] ENDOSCOPY;  Service: Gastroenterology;  Laterality: N/A;   COLONOSCOPY WITH PROPOFOL  N/A 03/26/2022   Procedure: COLONOSCOPY WITH PROPOFOL ;  Surgeon: Maryruth Ole DASEN, MD;  Location: [facility] ENDOSCOPY;  Service: Endoscopy;  Laterality: N/A;   ESOPHAGOGASTRODUODENOSCOPY (EGD) WITH PROPOFOL  N/A 01/20/2015   Procedure: ESOPHAGOGASTRODUODENOSCOPY (EGD) WITH PROPOFOL ;  Surgeon: Gladis RAYMOND Mariner, MD;  Location: [facility] ENDOSCOPY;  Service: Endoscopy;  Laterality: N/A;   ESOPHAGOGASTRODUODENOSCOPY (EGD) WITH PROPOFOL  N/A 01/22/2017   Procedure: ESOPHAGOGASTRODUODENOSCOPY (EGD) WITH PROPOFOL ;  Surgeon: Toledo, Ladell POUR, MD;  Location: [facility] ENDOSCOPY;  Service:  Gastroenterology;  Laterality: N/A;   ESOPHAGOGASTRODUODENOSCOPY (EGD) WITH PROPOFOL  N/A 04/14/2018   Procedure: ESOPHAGOGASTRODUODENOSCOPY (EGD) WITH PROPOFOL ;  Surgeon: Mariner Gladis RAYMOND, MD;  Location: [facility] ENDOSCOPY;  Service: Endoscopy;  Laterality: N/A;   ESOPHAGOGASTRODUODENOSCOPY (EGD) WITH PROPOFOL  N/A 10/16/2021   Procedure: ESOPHAGOGASTRODUODENOSCOPY (EGD) WITH PROPOFOL ;  Surgeon: Maryruth Ole DASEN, MD;  Location: [facility] ENDOSCOPY;  Service: Endoscopy;  Laterality: N/A;   EYE SURGERY Right    laser scraping   OPEN REDUCTION INTERNAL FIXATION (ORIF) DISTAL RADIAL FRACTURE Right 02/09/2018   Procedure: OPEN REDUCTION INTERNAL FIXATION (ORIF) DISTAL RADIAL FRACTURE;  Surgeon: Kathlynn Sharper, MD;  Location: [facility] ORS;  Service: Orthopedics;  Laterality: Right;   TONSILLECTOMY      OB History   No obstetric history on file.      Home Medications    Prior to Admission medications  Medication Sig Start Date End Date Taking? Authorizing Provider  ALPRAZolam  (XANAX ) 0.25 MG tablet Take 1 tablet (0.25 mg total) by mouth 2 (two) times daily as needed for anxiety. 12/30/23  Yes Marylynn Verneita CROME, MD  pantoprazole  (  PROTONIX ) 40 MG tablet TAKE 1 TABLET(40 MG) BY MOUTH DAILY 08/25/23  Yes Tullo, Teresa L, MD  acetaminophen  (TYLENOL ) 500 MG tablet Take 1,000 mg by mouth every 6 (six) hours as needed.    [provider]  albuterol  (PROVENTIL ) (2.5 MG/3ML) 0.083% nebulizer solution Take 3 mLs (2.5 mg total) by nebulization in the morning and at bedtime. 06/19/23 06/18/24  Isadora Hose, MD  albuterol  (VENTOLIN  HFA) 108 (90 Base) MCG/ACT inhaler Inhale 2 puffs into the lungs every 6 (six) hours as needed for shortness of breath. 01/30/24   Marylynn Verneita CROME, MD  budesonide -formoterol  (SYMBICORT ) 160-4.5 MCG/ACT inhaler Inhale 2 puffs into the lungs in the morning and at bedtime. 02/25/23   Assaker, Darrin, MD  cycloSPORINE  (RESTASIS ) 0.05 % ophthalmic emulsion Place 1 drop into both eyes 2  (two) times daily. 11/04/23   [provider]  fluticasone  (FLONASE ) 50 MCG/ACT nasal spray Place 1 spray into both nostrils in the morning and at bedtime. 02/25/23   Assaker, Darrin, MD  guaiFENesin  (MUCINEX ) 600 MG 12 hr tablet Take 600 mg by mouth 2 (two) times daily as needed for to loosen phlegm or cough.    [provider]  mometasone (ELOCON) 0.1 % lotion  11/27/23   [provider]  nortriptyline (PAMELOR) 10 MG capsule Take by mouth.    [provider]  predniSONE  (DELTASONE ) 10 MG tablet 6 tablets daily for 3 days, then reduce by 1 tablet daily until gone 03/18/24   Tullo, Teresa L, MD  psyllium (HYDROCIL/METAMUCIL) 95 % PACK Take 1 packet by mouth every other day.    [provider]  rosuvastatin  (CRESTOR ) 10 MG tablet TAKE 1 TABLET(10 MG) BY MOUTH DAILY 12/30/23   Marylynn Verneita CROME, MD  sodium chloride  HYPERTONIC 3 % nebulizer solution Take by nebulization in the morning and at bedtime. 06/19/23   Isadora Hose, MD  Spacer/Aero-Holding Raguel (AEROCHAMBER MV) inhaler Use as instructed 05/05/23   Malka Darrin, MD    Family History Family History  Problem Relation Age of Onset   Hyperlipidemia Mother    Alzheimer's disease Mother    Arthritis Mother    Miscarriages / Stillbirths Mother    Emphysema Father    AAA (abdominal aortic aneurysm) Father    Kidney Stones Father    Kidney cancer Brother    Cancer Brother    Pancreatic cancer Paternal Uncle    Diabetes Paternal Uncle    Stomach cancer Neg Hx    Colon cancer Neg Hx    Esophageal cancer Neg Hx     Social History Social History[1]   Allergies   Percocet [oxycodone -acetaminophen ], Ceftin [cefuroxime axetil], and Timentin [ticarcillin-pot clavulanate]   Review of Systems Review of Systems : negative unless otherwise stated in HPI.      Physical Exam Triage Vital Signs ED Triage Vitals  Encounter Vitals Group     BP 03/29/24 1120 131/65     Girls  Systolic BP Percentile --      Girls Diastolic BP Percentile --      Boys Systolic BP Percentile --      Boys Diastolic BP Percentile --      Pulse Rate 03/29/24 1120 81     Resp 03/29/24 1120 18     Temp 03/29/24 1120 97.7 F (36.5 C)     Temp Source 03/29/24 1120 Oral     SpO2 03/29/24 1120 100 %     Weight 03/29/24 1118 143 lb 12.5 oz (65.2 kg)  Height --      Head Circumference --      Peak Flow --      Pain Score 03/29/24 1118 4     Pain Loc --      Pain Education --      Exclude from Growth Chart --    No data found.  Updated Vital Signs BP 131/65 (BP Location: Left Arm)   Pulse 81   Temp 97.7 F (36.5 C) (Oral)   Resp 18   Wt 65.2 kg   SpO2 100%   BMI 25.47 kg/m   Visual Acuity Right Eye Distance: 20/40 Left Eye Distance: 20/40 Bilateral Distance: 20/30 (w/correction)  Right Eye Near:   Left Eye Near:    Bilateral Near:     Physical Exam  GEN: pleasant well appearing female, in no acute distress  NECK: normal ROM  CV: regular rate  RESP: no increased work of breathing EYES:     General: Lids are normal. Lids are everted, no foreign bodies appreciated. Vision grossly intact. Gaze aligned appropriately.        Right eye: No discharge.        Left eye: No foreign body, discharge or hordeolum.     Extraocular Movements: Extraocular movements intact.     PERRLA     Conjunctiva/sclera: mild conjunctiva injected bilaterally. No chemosis or hemorrhage.    Comments: fluorescein  stain deferred but inspected for foreign bodies  SKIN: warm and dry   UC Treatments / Results  Labs (all labs ordered are listed, but only abnormal results are displayed) Labs Reviewed - No data to display  EKG   Radiology No results found.  Procedures Procedures (including critical care time)  Medications Ordered in UC Medications  bacitracin -polymyxin b  (POLYSPORIN ) ophthalmic ointment ( Both Eyes Given 03/29/24 1200)    Initial Impression / Assessment and Plan /  UC Course  I have reviewed the triage vital signs and the nursing notes.  Pertinent labs & imaging results that were available during my care of the patient were reviewed by me and considered in my medical decision making (see chart for details).     Conjunctivitis  Patient is a 83 y.o. female who presents after bilaterally eye pain with discharge for the past  3 days.  Vision screen reviewed. On exam, she has a evidence of conjunctivitis bilaterally.  Treat with erythromycin  ointment TID for 7 days. First dose provided here.   Advised to follow-up with an ophthalmologist or optometrist, if  discomfort/pain is not improving after 7day course. Understanding voiced.   Discussed MDM, treatment plan and plan for follow-up with patient who agrees with plan.    Final Clinical Impressions(s) / UC Diagnoses   Final diagnoses:  Eye pain, bilateral     Discharge Instructions      Resume using your Restasis  and Refresh eye drops.  Use the antibiotic ointment 3 times a day for 7 days. Follow up with your primary eyecare provider or [facility], if symptoms suddenly worsen or you have little improvement in your eye symptoms.       ED Prescriptions   None    PDMP not reviewed this encounter.       [1]  Social History Tobacco Use   Smoking status: Never    Passive exposure: Past   Smokeless tobacco: Never   Tobacco comments:    Quit at age 35, smoked 1 pack a month   Vaping Use   Vaping status: Never Used  Substance Use Topics   Alcohol use: Yes    Alcohol/week: 2.0 standard drinks of alcohol    Types: 2 Glasses of wine per week    Comment: No   Drug use: Never     Kriste Berth, DO 03/29/24 1201  "

## 2024-03-29 NOTE — ED Triage Notes (Signed)
 Sx x 3 days  Nasal congestion  Eyes burning when she tries to read Some eye discharge

## 2024-04-20 ENCOUNTER — Encounter: Payer: Self-pay | Admitting: Nurse Practitioner

## 2024-04-21 NOTE — Telephone Encounter (Signed)
 Does she need a office visit?

## 2024-04-22 ENCOUNTER — Telehealth: Payer: Self-pay

## 2024-04-22 MED ORDER — FLUCONAZOLE 150 MG PO TABS: 150.0000 mg | ORAL_TABLET | Freq: Once | ORAL | 0 refills | Status: AC

## 2024-04-22 NOTE — Telephone Encounter (Signed)
 Copied from CRM #[id number]. Topic: General - Other >> Apr 22, 2024  9:18 AM Robinson DEL wrote: Reason for CRM: Patient following up on message sent to provider via MyChart on 1/13 for possible yeast infection again.  Briten[place] [phone] >> Apr 22, 2024  9:27 AM Robinson DEL wrote: Agent scheduled patient an appointment at [facility] for 1/16 patient still wants a callback from office

## 2024-04-23 ENCOUNTER — Encounter: Payer: Self-pay | Admitting: Family Medicine

## 2024-04-23 ENCOUNTER — Ambulatory Visit: Payer: Self-pay

## 2024-04-23 ENCOUNTER — Ambulatory Visit: Admitting: Family Medicine

## 2024-04-23 ENCOUNTER — Other Ambulatory Visit (HOSPITAL_COMMUNITY)
Admission: RE | Admit: 2024-04-23 | Discharge: 2024-04-23 | Disposition: A | Source: Ambulatory Visit | Attending: Family Medicine | Admitting: Family Medicine

## 2024-04-23 VITALS — BP 122/72 | HR 70 | Resp 16 | Ht 63.0 in | Wt 143.0 lb

## 2024-04-23 DIAGNOSIS — N898 Other specified noninflammatory disorders of vagina: Secondary | ICD-10-CM | POA: Insufficient documentation

## 2024-04-23 DIAGNOSIS — N760 Acute vaginitis: Secondary | ICD-10-CM

## 2024-04-23 MED ORDER — NYSTATIN 100000 UNIT/GM EX POWD: 1.0000 | Freq: Two times a day (BID) | CUTANEOUS | 0 refills | Status: AC | PRN

## 2024-04-23 NOTE — Telephone Encounter (Signed)
 Patient saw Laymon Core FNP today for an acute visit for her symptoms. She had a vaginal swab done, pending results, Nystatin  Power prescribed, and it was recommended that the patient start the Diflucan  that her PCP prescribed and to follow up with her PCP Dr Marylynn within the next month to discuss her symptoms and possible GYN referral. No available appointments within the next month when this RN checked so patient wanted to be contact to be scheduled sooner than a month away for this.   FYI Only or Action Required?: Action required by provider: request for appointment and update on patient condition.  Patient was last seen in primary care on 04/23/2024 by Wall, Laymon SAILOR, FNP.  Called Nurse Triage reporting Appointment.  Symptoms began several days ago.  Interventions attempted: Other: patient seen today by Laymon Core FNP at [facility].  Symptoms are: unchanged.  Triage Disposition: Call PCP When Office is Open  Patient/caregiver understands and will follow disposition?: Yes           Message from [facility] P sent at 04/23/2024 11:53 AM EST  Reason for Triage: Member has BV and yeast infection [facility] provider [facility] 04/23/24 but was told to see primary next available appt not until 02/10.  Pt is at about 4-5 on pain level and is uncomfortable.   Reason for Disposition  [1] Caller requesting NON-URGENT health information AND [2] PCP's office is the best resource  Answer Assessment - Initial Assessment Questions Patient saw Laymon Core FNP today for an acute visit for her symptoms. She had a vaginal swab done, pending results, Nystatin  Power prescribed, and it was recommended that the patient start the Diflucan  that her PCP prescribed and to follow up with her PCP Dr Marylynn within the next month to discuss her symptoms and possible GYN referral. No available appointments within the next month when this RN checked so patient wanted to be contact to be scheduled  sooner than a month away for this.  Patient denies any changes since being seen and evaluated at her appointment today with Laymon Core FNP. Patient is currently on her way to the Pharmacy to pick up the Diflucan  prescription and the Nystatin  Powder. Patient is advised to call us  back if anything changes or with any further questions/concerns. Patient is advised that if anything worsens to go to the Emergency Room. Patient verbalized understanding.  Protocols used: Information Only Call - No Triage-A-AH

## 2024-04-23 NOTE — Progress Notes (Signed)
" ° °  Acute Office Visit  Subjective:     Patient ID: Alison Bradshaw, female    DOB: [birth year], 84 y.o.   MRN: [id number]  Chief Complaint  Patient presents with   Vaginal Discharge    w/labia burning and redness    HPI Patient is in today for concerns of abnormal vaginal discharge. She also endorses associated vaginal irritation describing a burning pain. She also voices concerns of associated redness. She reports she had been seen by GYN as a virtual appointment due to vaginal dryness, and she reports she was started on vaginal estrogen therapy. She voices she experienced vaginal irritation shortly after starting topical therapy thus discontinued medication. She reports she had seen her PCP earlier in December for similar symptoms, referring to vaginal discharge, and treated for a yeast infection. She had messaged her PCP earlier this week voicing she felt as if she had another yeast infection. Diflucan  was sent which she voices she has not picked up the medication yet.   ROS Negative except as per HPI     Objective:    BP 122/72   Pulse 70   Resp 16   Ht 5' 3 (1.6 m)   Wt 143 lb (64.9 kg)   SpO2 98%   BMI 25.33 kg/m      Physical Exam Exam conducted with a chaperone present.  Constitutional:      General: She is not in acute distress.    Appearance: Normal appearance. She is well-developed. She is not toxic-appearing.  Cardiovascular:     Rate and Rhythm: Normal rate and regular rhythm.     Heart sounds: Normal heart sounds.  Pulmonary:     Effort: Pulmonary effort is normal.     Breath sounds: Normal breath sounds.  Genitourinary:    Labia:        Left: Tenderness present.      Comments: Left labia erythematous with mild edema without observed abscess or other lesion.  Right labia erythematous without presence of edema.  Vaginal discharge present on external exam. Thick, white vaginal discharge.  Neurological:     General: No focal deficit present.      Mental Status: She is alert.  Psychiatric:        Mood and Affect: Mood normal.        Behavior: Behavior normal.        Assessment & Plan:   Assessment & Plan Acute vaginitis -Vaginal swab completed, now pending  -Recommended that she start Diflucan  as prescribed by her PCP -Nystatin  powder sent to be used for external genitalia -Recommended that she follow up with her PCP within the next one month, potentially discuss GYN referral for provider offering in office visits as opposed to virtual  Orders:   Cervicovaginal ancillary only   nystatin  (MYCOSTATIN /NYSTOP ) powder; Apply 1 Application topically 2 (two) times daily as needed.      Return if symptoms worsen or fail to improve.  LAYMON LOISE CORE, FNP  "

## 2024-04-23 NOTE — Telephone Encounter (Signed)
 Addressed in mychart encounter dated 04/20/24

## 2024-04-23 NOTE — Telephone Encounter (Signed)
 Patient has been scheduled for a follow up per Dr Marylynn, MD. Patient was made aware and patient has been scheduled for Tuesday 01/20 at 9:30 am per Dr Marylynn. Pt verbalized understanding and has no further questions at this time.

## 2024-04-23 NOTE — Telephone Encounter (Signed)
 Noted.

## 2024-04-26 ENCOUNTER — Ambulatory Visit: Payer: Self-pay | Admitting: Family Medicine

## 2024-04-26 LAB — CERVICOVAGINAL ANCILLARY ONLY
Bacterial Vaginitis (gardnerella): NEGATIVE
Candida Glabrata: NEGATIVE
Candida Vaginitis: POSITIVE — AB
Comment: NEGATIVE
Comment: NEGATIVE
Comment: NEGATIVE

## 2024-04-27 ENCOUNTER — Encounter: Payer: Self-pay | Admitting: Internal Medicine

## 2024-04-27 ENCOUNTER — Ambulatory Visit: Admitting: Internal Medicine

## 2024-04-27 ENCOUNTER — Telehealth: Payer: Self-pay | Admitting: Internal Medicine

## 2024-04-27 VITALS — BP 120/68 | HR 67 | Ht 63.0 in | Wt 141.6 lb

## 2024-04-27 DIAGNOSIS — N952 Postmenopausal atrophic vaginitis: Secondary | ICD-10-CM

## 2024-04-27 DIAGNOSIS — N895 Stricture and atresia of vagina: Secondary | ICD-10-CM

## 2024-04-27 DIAGNOSIS — B3731 Acute candidiasis of vulva and vagina: Secondary | ICD-10-CM

## 2024-04-27 MED ORDER — FLUCONAZOLE 150 MG PO TABS: 150.0000 mg | ORAL_TABLET | Freq: Every day | ORAL | 0 refills | Status: AC

## 2024-04-27 MED ORDER — ROSUVASTATIN CALCIUM 10 MG PO TABS: ORAL_TABLET | ORAL | 3 refills | Status: AC

## 2024-04-27 NOTE — Progress Notes (Signed)
 "  Subjective:  Patient ID: Alison Bradshaw, female    DOB: June 15, 1940  Age: 84 y.o. MRN: [id number]  CC: The primary encounter diagnosis was Vaginitis, atrophic, postmenopausal. Diagnoses of Vaginal stenosis, Candida vaginitis, and Postmenopausal atrophic vaginitis were also pertinent to this visit.   HPI Alison Bradshaw presents for  Chief Complaint  Patient presents with   Medical Management of Chronic Issues    Summar is an 84 yr old female with a history of  benign endometrial hyperplasia by biopsy in 2023 ([place]) and atrophic vaginitis, recently started on vaginal estrogen  by  a [facility] gyn As[facility] Kurk bin late December who presents for follow up on recurrent symptoms of vaginitis.  After starting vaginal estrogen she developed sore breasts, itching and burning  and was treated empirically in December with fluconazole .  Episode recurred last week,  with white discharge,  so she was advised to stop the estrogen.  Symptoms did not improve so she was treated by [facility]  with one dose of fluconazole ,   symptomsms improved but did not resolve and then discharge became  more profuse last week  and she underwent pelvic exam and cervical culture was positive for yeast.  SHE completed a second course (one day ) on Friday of fluconazole   She has been widowed for several years,  and is getting remarried in a week and is concerned about being able to have intercourse .  She and Her future husband has been concerned about being able to tolerate penetration without pain        Outpatient Medications Prior to Visit  Medication Sig Dispense Refill   acetaminophen  (TYLENOL ) 500 MG tablet Take 1,000 mg by mouth every 6 (six) hours as needed.     albuterol  (PROVENTIL ) (2.5 MG/3ML) 0.083% nebulizer solution Take 3 mLs (2.5 mg total) by nebulization in the morning and at bedtime. 75 mL 11   albuterol  (VENTOLIN  HFA) 108 (90 Base) MCG/ACT inhaler Inhale 2 puffs into the lungs every 6 (six) hours as  needed for shortness of breath. 3 each 0   ALPRAZolam  (XANAX ) 0.25 MG tablet Take 1 tablet (0.25 mg total) by mouth 2 (two) times daily as needed for anxiety. 45 tablet 5   budesonide -formoterol  (SYMBICORT ) 160-4.5 MCG/ACT inhaler Inhale 2 puffs into the lungs in the morning and at bedtime. 1 each 12   fluticasone  (FLONASE ) 50 MCG/ACT nasal spray Place 1 spray into both nostrils in the morning and at bedtime. 100 mL 2   mometasone (ELOCON) 0.1 % lotion      nystatin  (MYCOSTATIN /NYSTOP ) powder Apply 1 Application topically 2 (two) times daily as needed. 15 g 0   pantoprazole  (PROTONIX ) 40 MG tablet TAKE 1 TABLET(40 MG) BY MOUTH DAILY 90 tablet 3   psyllium (HYDROCIL/METAMUCIL) 95 % PACK Take 1 packet by mouth every other day.     sodium chloride  HYPERTONIC 3 % nebulizer solution Take by nebulization in the morning and at bedtime. 750 mL 12   Spacer/Aero-Holding Chambers (AEROCHAMBER MV) inhaler Use as instructed 1 each 0   rosuvastatin  (CRESTOR ) 10 MG tablet TAKE 1 TABLET(10 MG) BY MOUTH DAILY 90 tablet 1   cycloSPORINE  (RESTASIS ) 0.05 % ophthalmic emulsion Place 1 drop into both eyes 2 (two) times daily. (Patient not taking: Reported on 04/27/2024)     estradiol (ESTRACE) 0.01 % CREA vaginal cream Place 1 Applicatorful vaginally. (Patient not taking: Reported on 04/27/2024)     nortriptyline (PAMELOR) 10 MG capsule Take by mouth. (Patient not taking:  Reported on 04/27/2024)     No facility-administered medications prior to visit.    Review of Systems;  Patient denies headache, fevers, malaise, unintentional weight loss, skin rash, eye pain, sinus congestion and sinus pain, sore throat, dysphagia,  hemoptysis , cough, dyspnea, wheezing, chest pain, palpitations, orthopnea, edema, abdominal pain, nausea, melena, diarrhea, constipation, flank pain, dysuria, hematuria, urinary  Frequency, nocturia, numbness, tingling, seizures,  Focal weakness, Loss of consciousness,  Tremor, insomnia, depression,  anxiety, and suicidal ideation.      Objective:  BP 120/68   Pulse 67   Ht 5' 3 (1.6 m)   Wt 141 lb 9.6 oz (64.2 kg)   SpO2 96%   BMI 25.08 kg/m   BP Readings from Last 3 Encounters:  04/27/24 120/68  04/23/24 122/72  03/29/24 131/65    Wt Readings from Last 3 Encounters:  04/27/24 141 lb 9.6 oz (64.2 kg)  04/23/24 143 lb (64.9 kg)  03/29/24 143 lb 12.5 oz (65.2 kg)    Physical Exam  Lab Results  Component Value Date   HGBA1C 6.2 12/30/2023    Lab Results  Component Value Date   CREATININE 0.75 03/13/2024   CREATININE 0.88 01/07/2024   CREATININE 0.98 12/30/2023    Lab Results  Component Value Date   WBC 5.9 03/13/2024   HGB 12.0 03/13/2024   HCT 35.4 (L) 03/13/2024   PLT 183 03/13/2024   GLUCOSE 110 (H) 03/13/2024   CHOL 165 03/13/2024   TRIG 63 03/13/2024   HDL 57 03/13/2024   LDLDIRECT 101.0 12/30/2023   LDLCALC 95 03/13/2024   ALT 16 03/13/2024   AST 30 03/13/2024   NA 139 03/13/2024   K 3.8 03/13/2024   CL 103 03/13/2024   CREATININE 0.75 03/13/2024   BUN 14 03/13/2024   CO2 25 03/13/2024   TSH 0.68 12/30/2023   INR 0.9 03/13/2024   HGBA1C 6.2 12/30/2023    No results found.  Assessment & Plan:  .Vaginitis, atrophic, postmenopausal -     Ambulatory referral to Obstetrics / Gynecology  Vaginal stenosis -     Ambulatory referral to Obstetrics / Gynecology  Candida vaginitis Assessment & Plan: Recurrent vs unresolved..  likely unresolved, will treat for 2 days with fluconazole     Postmenopausal atrophic vaginitis Assessment & Plan: Wit vaginal stenosis . Advised to resume topical estrogen less frequently.  Referring to  Dr Rutherford for evaluation and dilation    Other orders -     Rosuvastatin  Calcium ; TAKE 1 TABLET(10 MG) BY MOUTH DAILY  Dispense: 90 tablet; Refill: 3 -     Fluconazole ; Take 1 tablet (150 mg total) by mouth daily for 2 days.  Dispense: 2 tablet; Refill: 0      Verneita LITTIE Kettering, MD "

## 2024-04-27 NOTE — Telephone Encounter (Signed)
 noted

## 2024-04-27 NOTE — Assessment & Plan Note (Signed)
 Recurrent vs unresolved..  likely unresolved, will treat for 2 days with fluconazole 

## 2024-04-27 NOTE — Patient Instructions (Addendum)
 Let's repeat the treatment for yeast for 2 more doses  (once daily)  Resume the vaginal estrogen but apply to your introitus (the opening of your vagina ) using your finger,  do not use daily ;  just every 2 days   Referral to dr Dickie Carder at [facility] in [place] .

## 2024-04-27 NOTE — Telephone Encounter (Signed)
 Non-Opioid Controlled Substance Agreement scanned and signed 1.20.26

## 2024-04-27 NOTE — Assessment & Plan Note (Signed)
 Wit vaginal stenosis . Advised to resume topical estrogen less frequently.  Referring to  Dr Rutherford for evaluation and dilation

## 2024-04-29 NOTE — Telephone Encounter (Signed)
 Chelsea Aurora signed Pt evaluation and I faxed it to [facility] at [phone] and received an ok confirmation.

## 2024-05-20 ENCOUNTER — Ambulatory Visit: Admitting: Infectious Diseases

## 2024-05-26 ENCOUNTER — Ambulatory Visit: Admitting: Nurse Practitioner

## 2024-06-28 ENCOUNTER — Ambulatory Visit: Admitting: Internal Medicine

## 2024-11-02 ENCOUNTER — Encounter (INDEPENDENT_AMBULATORY_CARE_PROVIDER_SITE_OTHER)

## 2024-11-02 ENCOUNTER — Ambulatory Visit (INDEPENDENT_AMBULATORY_CARE_PROVIDER_SITE_OTHER): Admitting: Vascular Surgery

## 2024-11-10 ENCOUNTER — Ambulatory Visit
